# Patient Record
Sex: Male | Born: 1955
Health system: Southern US, Community
[De-identification: ages and names within clinical notes are randomized; demographics above are authoritative.]

## PROBLEM LIST (undated history)

## (undated) DIAGNOSIS — E785 Hyperlipidemia, unspecified: Secondary | ICD-10-CM

## (undated) DIAGNOSIS — G4700 Insomnia, unspecified: Secondary | ICD-10-CM

## (undated) DIAGNOSIS — I10 Essential (primary) hypertension: Secondary | ICD-10-CM

## (undated) DIAGNOSIS — Z8619 Personal history of other infectious and parasitic diseases: Secondary | ICD-10-CM

## (undated) DIAGNOSIS — M5431 Sciatica, right side: Secondary | ICD-10-CM

## (undated) DIAGNOSIS — K5790 Diverticulosis of intestine, part unspecified, without perforation or abscess without bleeding: Secondary | ICD-10-CM

## (undated) DIAGNOSIS — M6530 Trigger finger, unspecified finger: Secondary | ICD-10-CM

## (undated) DIAGNOSIS — D099 Carcinoma in situ, unspecified: Secondary | ICD-10-CM

## (undated) DIAGNOSIS — G609 Hereditary and idiopathic neuropathy, unspecified: Secondary | ICD-10-CM

## (undated) DIAGNOSIS — K635 Polyp of colon: Secondary | ICD-10-CM

## (undated) DIAGNOSIS — Z0000 Encounter for general adult medical examination without abnormal findings: Secondary | ICD-10-CM

## (undated) DIAGNOSIS — N200 Calculus of kidney: Secondary | ICD-10-CM

## (undated) DIAGNOSIS — J302 Other seasonal allergic rhinitis: Secondary | ICD-10-CM

## (undated) DIAGNOSIS — Z87442 Personal history of urinary calculi: Secondary | ICD-10-CM

## (undated) DIAGNOSIS — E1149 Type 2 diabetes mellitus with other diabetic neurological complication: Secondary | ICD-10-CM

## (undated) HISTORY — DX: Hereditary and idiopathic neuropathy, unspecified: G60.9

## (undated) HISTORY — DX: Polyp of colon: K63.5

## (undated) HISTORY — DX: Carcinoma in situ, unspecified: D09.9

## (undated) HISTORY — DX: Other seasonal allergic rhinitis: J30.2

## (undated) HISTORY — DX: Personal history of urinary calculi: Z87.442

## (undated) HISTORY — DX: Encounter for general adult medical examination without abnormal findings: Z00.00

## (undated) HISTORY — DX: Personal history of other infectious and parasitic diseases: Z86.19

## (undated) HISTORY — DX: Diverticulosis of intestine, part unspecified, without perforation or abscess without bleeding: K57.90

## (undated) HISTORY — DX: Trigger finger, unspecified finger: M65.30

## (undated) HISTORY — DX: Calculus of kidney: N20.0

## (undated) HISTORY — DX: Insomnia, unspecified: G47.00

## (undated) HISTORY — DX: Sciatica, right side: M54.31

## (undated) HISTORY — PX: HAND SURGERY: SHX662

## (undated) HISTORY — PX: ROTATOR CUFF REPAIR: SHX139

## (undated) HISTORY — PX: SHOULDER ARTHROSCOPY: SHX128

## (undated) HISTORY — DX: Hyperlipidemia, unspecified: E78.5

## (undated) HISTORY — PX: HERNIA REPAIR: SHX51

## (undated) HISTORY — PX: CHOLECYSTECTOMY: SHX55

## (undated) HISTORY — PX: ABDOMINAL HERNIA REPAIR: SHX539

## (undated) HISTORY — DX: Type 2 diabetes mellitus with other diabetic neurological complication: E11.49

---

## 2010-09-24 ENCOUNTER — Emergency Department (HOSPITAL_COMMUNITY)
Admission: EM | Admit: 2010-09-24 | Discharge: 2010-09-24 | Disposition: A | Discharge status: Home / Self Care | Payer: 59 | Attending: Emergency Medicine | Admitting: Emergency Medicine

## 2010-09-24 DIAGNOSIS — E7800 Pure hypercholesterolemia, unspecified: Secondary | ICD-10-CM | POA: Insufficient documentation

## 2010-09-24 DIAGNOSIS — M545 Low back pain, unspecified: Secondary | ICD-10-CM | POA: Insufficient documentation

## 2010-09-24 DIAGNOSIS — E119 Type 2 diabetes mellitus without complications: Secondary | ICD-10-CM | POA: Insufficient documentation

## 2010-09-24 DIAGNOSIS — M5430 Sciatica, unspecified side: Secondary | ICD-10-CM | POA: Insufficient documentation

## 2012-02-06 ENCOUNTER — Emergency Department (HOSPITAL_BASED_OUTPATIENT_CLINIC_OR_DEPARTMENT_OTHER): Payer: 59

## 2012-02-06 ENCOUNTER — Emergency Department (HOSPITAL_BASED_OUTPATIENT_CLINIC_OR_DEPARTMENT_OTHER)
Admission: EM | Admit: 2012-02-06 | Discharge: 2012-02-06 | Disposition: A | Discharge status: Home / Self Care | Payer: 59 | Attending: Emergency Medicine | Admitting: Emergency Medicine

## 2012-02-06 ENCOUNTER — Encounter (HOSPITAL_BASED_OUTPATIENT_CLINIC_OR_DEPARTMENT_OTHER): Payer: Self-pay | Admitting: *Deleted

## 2012-02-06 DIAGNOSIS — I10 Essential (primary) hypertension: Secondary | ICD-10-CM | POA: Insufficient documentation

## 2012-02-06 DIAGNOSIS — E119 Type 2 diabetes mellitus without complications: Secondary | ICD-10-CM | POA: Insufficient documentation

## 2012-02-06 DIAGNOSIS — M51379 Other intervertebral disc degeneration, lumbosacral region without mention of lumbar back pain or lower extremity pain: Secondary | ICD-10-CM | POA: Insufficient documentation

## 2012-02-06 DIAGNOSIS — M25569 Pain in unspecified knee: Secondary | ICD-10-CM | POA: Insufficient documentation

## 2012-02-06 DIAGNOSIS — M5136 Other intervertebral disc degeneration, lumbar region: Secondary | ICD-10-CM

## 2012-02-06 DIAGNOSIS — M5137 Other intervertebral disc degeneration, lumbosacral region: Secondary | ICD-10-CM | POA: Insufficient documentation

## 2012-02-06 HISTORY — DX: Essential (primary) hypertension: I10

## 2012-02-06 MED ORDER — OXYCODONE-ACETAMINOPHEN 5-325 MG PO TABS
2.0000 | ORAL_TABLET | Freq: Once | ORAL | Status: AC
  Administered 2012-02-06: 2 via ORAL
  Filled 2012-02-06: qty 2

## 2012-02-06 MED ORDER — IBUPROFEN 800 MG PO TABS: 800.0000 mg | ORAL_TABLET | Freq: Three times a day (TID) | ORAL | Status: AC

## 2012-02-06 MED ORDER — PREDNISONE 10 MG PO TABS: ORAL_TABLET | ORAL | Status: DC

## 2012-02-06 MED ORDER — OXYCODONE-ACETAMINOPHEN 5-325 MG PO TABS: 2.0000 | ORAL_TABLET | ORAL | Status: DC | PRN

## 2012-02-06 NOTE — ED Provider Notes (Signed)
Medical screening examination/treatment/procedure(s) were conducted as a shared visit with non-physician practitioner(s) and myself.  I personally evaluated the patient during the encounter 56 yo man with diet controlled diabetes has pain that radiates from right thigh into right foot.  He also has occasional numbness and tingling in both feet.  Exam shows no palpable deformity or tenderness in his right thigh, knee, calf, ankle or foot.  He has intact sensation in the right foot and has palpable DP and PT pulses in both feet.  Suspect his pain is radicular, and recommended LS spine MRI to check for disc disease.  Carleene Cooper III, MD 02/06/12 [phone]

## 2012-02-06 NOTE — ED Provider Notes (Signed)
History     CSN: [id number]  Arrival date & time 02/06/12  1128   First MD Initiated Contact with Patient 02/06/12 1219      Chief Complaint  Patient presents with  . Leg Pain    (Consider location/radiation/quality/duration/timing/severity/associated sxs/prior treatment) Patient is a 56 y.o. male presenting with leg pain. The history is provided by the patient. No language interpreter was used.  Leg Pain  Incident onset: 3 months. The incident occurred at home. There was no injury mechanism. The pain is present in the right thigh. The quality of the pain is described as aching. The pain is moderate. Associated symptoms include numbness. The symptoms are aggravated by bearing weight. He has tried nothing for the symptoms.  Pt reports he has had sciatica on the right side for several months.  Pt reports pain in right anterior leg from knee down to shin for several days.  Past Medical History  Diagnosis Date  . Diabetes mellitus   . Hypertension     Past Surgical History  Procedure Date  . Hernia repair   . Abdominal hernia repair   . Rotator cuff repair   . Cholecystectomy     No family history on file.  History  Substance Use Topics  . Smoking status: Never Smoker   . Smokeless tobacco: Not on file  . Alcohol Use: No      Review of Systems  Musculoskeletal: Positive for joint swelling.  Neurological: Positive for numbness.  All other systems reviewed and are negative.    Allergies  Iodine  Home Medications   Current Outpatient Rx  Name Route Sig Dispense Refill  . CETIRIZINE HCL 10 MG PO TABS Oral Take 10 mg by mouth daily.    . MULTIPLE VITAMINS PO Oral Take by mouth.      BP 133/88  Pulse 95  Temp 97.8 F (36.6 C) (Oral)  Ht 6' (1.829 m)  Wt 180 lb (81.647 kg)  BMI 24.41 kg/m2  SpO2 98%  Physical Exam  Vitals reviewed. Constitutional: He appears well-developed and well-nourished.  HENT:  Head: Normocephalic and atraumatic.  Eyes:  Conjunctivae and EOM are normal. Pupils are equal, round, and reactive to light.  Neck: Normal range of motion. Neck supple.  Cardiovascular: Normal rate, normal heart sounds and intact distal pulses.   Pulmonary/Chest: Effort normal.  Abdominal: Soft.  Musculoskeletal: He exhibits tenderness.       Tender right knee,    Neurological: He is alert.  Skin: Skin is warm.  Psychiatric: He has a normal mood and affect.    ED Course  Procedures (including critical care time)  Labs Reviewed - No data to display Dg Knee 1-2 Views Right  02/06/2012  *RADIOLOGY REPORT*  Clinical Data: Right knee pain.  RIGHT KNEE - 1-2 VIEW  Comparison: None  Findings: The joint spaces are maintained.  No acute fracture or osteochondral lesion.  No definite joint effusion.  IMPRESSION: No acute bony findings or significant degenerative changes.  Original Report Authenticated By: P. Loralie Champagne, M.D.   No results found for this or any previous visit. Dg Knee 1-2 Views Right  02/06/2012  *RADIOLOGY REPORT*  Clinical Data: Right knee pain.  RIGHT KNEE - 1-2 VIEW  Comparison: None  Findings: The joint spaces are maintained.  No acute fracture or osteochondral lesion.  No definite joint effusion.  IMPRESSION: No acute bony findings or significant degenerative changes.  Original Report Authenticated By: P. Loralie Champagne, M.D.   Mr Lumbar  Spine Wo Contrast  02/06/2012  *RADIOLOGY REPORT*  Clinical Data: Right leg pain.  Bilateral foot tingling.  MRI LUMBAR SPINE WITHOUT CONTRAST  Technique:  Multiplanar and multiecho pulse sequences of the lumbar spine were obtained without intravenous contrast.  Comparison: None.  Findings: The numbering convention used for this exam terms L5-S1 as the last full intervertebral disc space above the sacrum. Marrow signal is normal allowing for degenerative endplate changes at L1-L2.  Vacuum disc at L5-S1.  Spinal cord terminates posterior to the T12-L1 interspace.  T12-L1:  Shallow  broad-based disc bulging.  Sagittal imaging only. No stenosis.  L1-L2:  Disc desiccation with shallow broad-based posterior disc bulging.  No resulting stenosis.  L2-L3:  Negative.  L3-L4:  Shallow left foraminal and lateral disc protrusion contacts the exiting left L3 nerve.  Smaller right foraminal protrusion contacts the exiting right L3 nerve.  Central canal and lateral recesses patent.  Mild facet arthrosis.  L4-L5:  Disc desiccation.  Central canal appears patent.  Shallow broad-based disc bulging.  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 The foramina appear adequately patent.  Mild facet arthrosis.  L5-S1:  Disc desiccation.  No stenosis.  Foramina patent.  IMPRESSION: Mild multilevel lumbar spondylosis as described above.  No compressive stenosis.  Original Report Authenticated By: Andreas Newport, M.D.     1. Degeneration of lumbar intervertebral disc       MDM Dr. Ignacia Palma in to see and examine pt.   He advised MRi of pt Ls spine.    I reviewed results with pt.   I think leg pain is radicular.  Pt advised to follow up with Dr. Gerlene Fee for evaluation.   Pt given rx for percocet and prednisone.           Lonia Skinner [place], [place] 02/06/12 1727

## 2012-02-06 NOTE — Discharge Instructions (Signed)
Degenerative Disc Disease Degenerative disc disease is a condition caused by the changes that occur in the cushions of the backbone (spinal discs) as you grow older. Spinal discs are soft and compressible discs located between the bones of the spine (vertebrae). They act like shock absorbers. Degenerative disc disease can affect the wholespine. However, the neck and lower back are most commonly affected. Many changes can occur in the spinal discs with aging, such as:  The spinal discs may dry and shrink.   Small tears may occur in the tough, outer covering of the disc (annulus).   The disc space may become smaller due to loss of water.   Abnormal growths in the bone (spurs) may occur. This can put pressure on the nerve roots exiting the spinal canal, causing pain.   The spinal canal may become narrowed.  CAUSES  Degenerative disc disease is a condition caused by the changes that occur in the spinal discs with aging. The exact cause is not known, but there is a genetic basis for many patients. Degenerative changes can occur due to loss of fluid in the disc. This makes the disc thinner and reduces the space between the backbones. Small cracks can develop in the outer layer of the disc. This can lead to the breakdown of the disc. You are more likely to get degenerative disc disease if you are overweight. Smoking cigarettes and doing heavy work such as weightlifting can also increase your risk of this condition. Degenerative changes can start after a sudden injury. Growth of bone spurs can compress the nerve roots and cause pain.  SYMPTOMS  The symptoms vary from person to person. Some people may have no pain, while others have severe pain. The pain may be so severe that it can limit your activities. The location of the pain depends on the part of your backbone that is affected. You will have neck or arm pain if a disc in the neck area is affected. You will have pain in your back, buttocks, or legs if a  disc in the lower back is affected. The pain becomes worse while bending, reaching up, or with twisting movements. The pain may start gradually and then get worse as time passes. It may also start after a major or minor injury. You may feel numbness or tingling in the arms or legs.  DIAGNOSIS  Your caregiver will ask you about your symptoms and about activities or habits that may cause the pain. He or she may also ask about any injuries, diseases, ortreatments you have had earlier. Your caregiver will examine you to check for the range of movement that is possible in the affected area, to check for strength in your extremities, and to check for sensation in the areas of the arms and legs supplied by different nerve roots. An X-ray of the spine may be taken. Your caregiver may suggest other imaging tests, such as a computerized magnetic scan (MRI), if needed.  TREATMENT  Treatment includes rest, modifying your activities, and applying ice and heat. Your caregiver may prescribe medicines to reduce your pain and may ask you to do some exercises to strengthen your back. In some cases, you may need surgery. You and your caregiver will decide on the treatment that is best for you. HOME CARE INSTRUCTIONS   Follow proper lifting and walking techniques as advised by your caregiver.   Maintain good posture.   Exercise regularly as advised.   Perform relaxation exercises.   Change your sitting,   standing, and sleeping habits as advised. Change positions frequently.   Lose weight as advised.   Stop smoking if you smoke.   Wear supportive footwear.  SEEK MEDICAL CARE IF:  The pain does not go away within 1 to 4 weeks. SEEK IMMEDIATE MEDICAL CARE IF:   The pain is severe.   You notice weakness in your arms, hands, or legs.   You begin to lose control of your bladder or bowel.  MAKE SURE YOU:   Understand these instructions.   Will watch your condition.   Will get help right away if you are not  doing well or get worse.  Document Released: 05/28/2007 Document Revised: 07/20/2011 Document Reviewed: 05/28/2007 [employer] Patient Information 2012 [employer]. 

## 2012-02-06 NOTE — ED Notes (Signed)
Patient states he has had right leg pain for several months and was treated for sciatica.  States he has had intermittent pain and cramps in his leg , but this morning the pain has been persistent and had some swelling in the calf area.

## 2012-02-08 NOTE — ED Notes (Signed)
Pt called to ask if he could get a refill on pain medication until he can get in to see the neurologist looked up record to speak with MD

## 2012-02-09 ENCOUNTER — Encounter: Payer: Self-pay | Admitting: Internal Medicine

## 2012-02-09 ENCOUNTER — Ambulatory Visit (INDEPENDENT_AMBULATORY_CARE_PROVIDER_SITE_OTHER): Payer: 59 | Admitting: Internal Medicine

## 2012-02-09 VITALS — BP 122/82 | HR 108 | Temp 98.2°F | Resp 18 | Ht 70.5 in | Wt 194.0 lb

## 2012-02-09 DIAGNOSIS — E785 Hyperlipidemia, unspecified: Secondary | ICD-10-CM

## 2012-02-09 DIAGNOSIS — E1149 Type 2 diabetes mellitus with other diabetic neurological complication: Secondary | ICD-10-CM

## 2012-02-09 DIAGNOSIS — E119 Type 2 diabetes mellitus without complications: Secondary | ICD-10-CM

## 2012-02-09 DIAGNOSIS — IMO0002 Reserved for concepts with insufficient information to code with codable children: Secondary | ICD-10-CM

## 2012-02-09 DIAGNOSIS — M5416 Radiculopathy, lumbar region: Secondary | ICD-10-CM | POA: Insufficient documentation

## 2012-02-09 HISTORY — DX: Type 2 diabetes mellitus with other diabetic neurological complication: E11.49

## 2012-02-09 MED ORDER — OXYCODONE-ACETAMINOPHEN 7.5-325 MG PO TABS: 1.0000 | ORAL_TABLET | ORAL | Status: AC | PRN

## 2012-02-09 NOTE — Progress Notes (Signed)
  Subjective:    [name], male    DOB: 08/27/1955, 56 y.o.   MRN: [id number]  HPI Pt presents to clinic for evaluation of back pain. Notes right low back pain with intermittent radiation to right leg. Has associated numbness but denies weakness or incontinence. No injury/trauma. Seen in ED 6/25 and ls MRI performed. Reviewed with pt demonstrating bilateral disc protrusions at L3-4 contacting both exiting nerve roots. Currently taking prednisone taper and has run out of percocet (had #6). Was apparently referred to neurosurgery and has appointment next week. No alleviating or exacerbating factors. Reports DM now under good control after losing weight and focusing on diet/exercise. Baseline fsbs range 110-120 with recent peak of 195 while on prednisone.   Past Medical History  Diagnosis Date  . Diabetes mellitus   . Hypertension    Past Surgical History  Procedure Date  . Hernia repair   . Abdominal hernia repair   . Rotator cuff repair   . Cholecystectomy     reports that he has never smoked. He does not have any smokeless tobacco history on file. He reports that he does not drink alcohol or use illicit drugs. family history is not on file. Allergies  Allergen Reactions  . Iodine Swelling      Review of Systems  Constitutional: Negative for activity change.  Musculoskeletal: Positive for back pain.  Neurological: Positive for numbness. Negative for weakness.  All other systems reviewed and are negative.       Objective:   Physical Exam  Nursing note and vitals reviewed. Constitutional: He appears well-developed and well-nourished. No distress.  HENT:  Head: Normocephalic and atraumatic.  Right Ear: External ear normal.  Left Ear: External ear normal.  Eyes: Conjunctivae are normal. No scleral icterus.  Neck: Neck supple.  Musculoskeletal:       Gait nl. Bilateral LE strength 5/5. FROM bilateral legs.   Neurological: He is alert.  Skin: Skin is warm and  dry. He is not diaphoretic.  Psychiatric: He has a normal mood and affect.          Assessment & Plan:

## 2012-02-09 NOTE — Assessment & Plan Note (Signed)
Good control. Monitor fsbs carefully during steroid use. Schedule one month f/u with diabetic labs.

## 2012-02-09 NOTE — Assessment & Plan Note (Signed)
Continue steroid taper. rf percocet for short term prn use. Has neurosurgery evaluation soon. Discussed PT- given back exercises until can see specialist.

## 2012-02-09 NOTE — Patient Instructions (Signed)
Please schedule fasting labs prior to your next visit Cbc, chem7, a1c,urine microalbumin-250.00 and lipid/lft-272.7

## 2012-03-15 ENCOUNTER — Telehealth: Payer: Self-pay | Admitting: *Deleted

## 2012-03-15 DIAGNOSIS — E785 Hyperlipidemia, unspecified: Secondary | ICD-10-CM

## 2012-03-15 DIAGNOSIS — E119 Type 2 diabetes mellitus without complications: Secondary | ICD-10-CM

## 2012-03-15 LAB — HEPATIC FUNCTION PANEL
ALT: 19 U/L (ref 0–53)
AST: 16 U/L (ref 0–37)
Bilirubin, Direct: 0.1 mg/dL (ref 0.0–0.3)
Indirect Bilirubin: 0.6 mg/dL (ref 0.0–0.9)
Total Protein: 7.3 g/dL (ref 6.0–8.3)

## 2012-03-15 LAB — CBC WITH DIFFERENTIAL/PLATELET
Basophils Absolute: 0.1 10*3/uL (ref 0.0–0.1)
Eosinophils Absolute: 0.4 10*3/uL (ref 0.0–0.7)
Lymphocytes Relative: 45 % (ref 12–46)
Lymphs Abs: 2.9 10*3/uL (ref 0.7–4.0)
Neutrophils Relative %: 38 % — ABNORMAL LOW (ref 43–77)
Platelets: 230 10*3/uL (ref 150–400)
RBC: 5.61 MIL/uL (ref 4.22–5.81)
RDW: 14.1 % (ref 11.5–15.5)
WBC: 6.4 10*3/uL (ref 4.0–10.5)

## 2012-03-15 LAB — BASIC METABOLIC PANEL
BUN: 15 mg/dL (ref 6–23)
Calcium: 9.6 mg/dL (ref 8.4–10.5)
Creat: 0.7 mg/dL (ref 0.50–1.35)
Glucose, Bld: 249 mg/dL — ABNORMAL HIGH (ref 70–99)

## 2012-03-15 LAB — LIPID PANEL
LDL Cholesterol: 152 mg/dL — ABNORMAL HIGH (ref 0–99)
Triglycerides: 248 mg/dL — ABNORMAL HIGH (ref <150)
VLDL: 50 mg/dL — ABNORMAL HIGH (ref 0–40)

## 2012-03-15 LAB — HEMOGLOBIN A1C: Hgb A1c MFr Bld: 13.5 % — ABNORMAL HIGH (ref <5.7)

## 2012-03-15 NOTE — Telephone Encounter (Signed)
Pt presented to the lab for blood work. Orders entered per previous office note:  Please schedule fasting labs prior to your next visit  Cbc, chem7, a1c,urine microalbumin-250.00 and lipid/lft-272.7

## 2012-03-16 LAB — MICROALBUMIN / CREATININE URINE RATIO
Creatinine, Urine: 224.8 mg/dL
Microalb, Ur: 2.59 mg/dL — ABNORMAL HIGH (ref 0.00–1.89)

## 2012-03-21 ENCOUNTER — Ambulatory Visit (INDEPENDENT_AMBULATORY_CARE_PROVIDER_SITE_OTHER): Payer: 59 | Admitting: Internal Medicine

## 2012-03-21 ENCOUNTER — Encounter: Payer: Self-pay | Admitting: Internal Medicine

## 2012-03-21 VITALS — BP 116/84 | HR 108 | Temp 97.8°F | Resp 16 | Wt 195.0 lb

## 2012-03-21 DIAGNOSIS — E119 Type 2 diabetes mellitus without complications: Secondary | ICD-10-CM

## 2012-03-21 DIAGNOSIS — E785 Hyperlipidemia, unspecified: Secondary | ICD-10-CM

## 2012-03-21 MED ORDER — ATORVASTATIN CALCIUM 20 MG PO TABS: 20.0000 mg | ORAL_TABLET | Freq: Every day | ORAL | Status: DC

## 2012-03-21 MED ORDER — METFORMIN HCL 500 MG PO TABS: 500.0000 mg | ORAL_TABLET | Freq: Two times a day (BID) | ORAL | Status: DC

## 2012-03-21 NOTE — Progress Notes (Signed)
  Subjective:    [name], male    DOB: 12-24-1955, 56 y.o.   MRN: [id number]  HPI Pt presents to clinic for followup of multiple medical problems. Notes home fsbs 200's. Reviewed a1c elevated 13.5. Admits to some dietary indiscretion with sugar. Has been off medication for ~ 1 year. LDL reviewed above target. Back pain improved. No other complaints.  Past Medical History  Diagnosis Date  . Diabetes mellitus   . Hypertension   . History of chicken pox     childhood  . Seasonal allergies   . History of kidney stones   . Hyperlipidemia    Past Surgical History  Procedure Date  . Hernia repair   . Abdominal hernia repair   . Rotator cuff repair   . Cholecystectomy     reports that he has never smoked. He has never used smokeless tobacco. He reports that he does not drink alcohol or use illicit drugs. family history includes Cancer in his paternal grandmother; Diabetes in his maternal aunt; and Lung cancer in his mother.  There is no history of Breast cancer, and Colon cancer, and Prostate cancer, and Heart disease, . Allergies  Allergen Reactions  . Contrast Media (Iodinated Diagnostic Agents)   . Iodine Swelling      Review of Systems see hpi     Objective:   Physical Exam  Nursing note and vitals reviewed. Constitutional: He appears well-developed and well-nourished. No distress.  HENT:  Head: Normocephalic and atraumatic.  Neurological: He is alert.  Skin: He is not diaphoretic.  Psychiatric: He has a normal mood and affect.          Assessment & Plan:

## 2012-03-21 NOTE — Assessment & Plan Note (Signed)
Poor control. Asx. Begin metformin. Pursue diabetic diet and exercise. Close f/u in 2-3 wks to review progress. Check fsbs bid due to hyperglycemia.

## 2012-03-21 NOTE — Assessment & Plan Note (Signed)
Begin lipitor 20mg  qd

## 2012-03-26 ENCOUNTER — Encounter: Payer: 59 | Admitting: Internal Medicine

## 2012-04-02 ENCOUNTER — Encounter (HOSPITAL_BASED_OUTPATIENT_CLINIC_OR_DEPARTMENT_OTHER): Payer: Self-pay | Admitting: *Deleted

## 2012-04-02 ENCOUNTER — Ambulatory Visit (INDEPENDENT_AMBULATORY_CARE_PROVIDER_SITE_OTHER): Payer: 59 | Admitting: Family

## 2012-04-02 ENCOUNTER — Emergency Department (HOSPITAL_BASED_OUTPATIENT_CLINIC_OR_DEPARTMENT_OTHER)
Admission: EM | Admit: 2012-04-02 | Discharge: 2012-04-02 | Disposition: A | Discharge status: Home / Self Care | Payer: 59 | Attending: Emergency Medicine | Admitting: Emergency Medicine

## 2012-04-02 ENCOUNTER — Emergency Department (HOSPITAL_BASED_OUTPATIENT_CLINIC_OR_DEPARTMENT_OTHER): Payer: 59

## 2012-04-02 ENCOUNTER — Encounter: Payer: Self-pay | Admitting: Family

## 2012-04-02 VITALS — BP 118/86 | HR 87 | Temp 97.8°F | Resp 16 | Wt 193.8 lb

## 2012-04-02 DIAGNOSIS — R109 Unspecified abdominal pain: Secondary | ICD-10-CM

## 2012-04-02 DIAGNOSIS — E119 Type 2 diabetes mellitus without complications: Secondary | ICD-10-CM | POA: Insufficient documentation

## 2012-04-02 DIAGNOSIS — R110 Nausea: Secondary | ICD-10-CM | POA: Insufficient documentation

## 2012-04-02 DIAGNOSIS — K469 Unspecified abdominal hernia without obstruction or gangrene: Secondary | ICD-10-CM

## 2012-04-02 DIAGNOSIS — R10814 Left lower quadrant abdominal tenderness: Secondary | ICD-10-CM | POA: Insufficient documentation

## 2012-04-02 DIAGNOSIS — I10 Essential (primary) hypertension: Secondary | ICD-10-CM | POA: Insufficient documentation

## 2012-04-02 DIAGNOSIS — R197 Diarrhea, unspecified: Secondary | ICD-10-CM | POA: Insufficient documentation

## 2012-04-02 LAB — COMPREHENSIVE METABOLIC PANEL
Albumin: 4.2 g/dL (ref 3.5–5.2)
Alkaline Phosphatase: 85 U/L (ref 39–117)
BUN: 11 mg/dL (ref 6–23)
CO2: 24 mEq/L (ref 19–32)
Chloride: 97 mEq/L (ref 96–112)
Creatinine, Ser: 0.6 mg/dL (ref 0.50–1.35)
GFR calc Af Amer: >90 mL/min (ref >90)
GFR calc non Af Amer: >90 mL/min (ref >90)
Glucose, Bld: 170 mg/dL — ABNORMAL HIGH (ref 70–99)
Potassium: 3.6 mEq/L (ref 3.5–5.1)
Total Bilirubin: 0.7 mg/dL (ref 0.3–1.2)

## 2012-04-02 LAB — URINALYSIS, ROUTINE W REFLEX MICROSCOPIC
Glucose, UA: 500 mg/dL — AB
Ketones, ur: 15 mg/dL — AB
Leukocytes, UA: NEGATIVE
Protein, ur: NEGATIVE mg/dL
Urobilinogen, UA: 1 mg/dL (ref 0.0–1.0)

## 2012-04-02 LAB — CBC WITH DIFFERENTIAL/PLATELET
HCT: 42.9 % (ref 39.0–52.0)
Hemoglobin: 15.6 g/dL (ref 13.0–17.0)
Lymphocytes Relative: 42 % (ref 12–46)
Lymphs Abs: 2.8 10*3/uL (ref 0.7–4.0)
MCHC: 36.4 g/dL — ABNORMAL HIGH (ref 30.0–36.0)
Monocytes Absolute: 0.8 10*3/uL (ref 0.1–1.0)
Monocytes Relative: 12 % (ref 3–12)
Neutro Abs: 2.6 10*3/uL (ref 1.7–7.7)
Neutrophils Relative %: 40 % — ABNORMAL LOW (ref 43–77)
RBC: 5.17 MIL/uL (ref 4.22–5.81)

## 2012-04-02 MED ORDER — IOHEXOL 300 MG/ML  SOLN: 18.0000 mL | INTRAMUSCULAR | Status: AC

## 2012-04-02 MED ORDER — DIPHENHYDRAMINE HCL 50 MG/ML IJ SOLN
25.0000 mg | Freq: Once | INTRAMUSCULAR | Status: AC
  Administered 2012-04-02: 25 mg via INTRAVENOUS
  Filled 2012-04-02 (×2): qty 1

## 2012-04-02 MED ORDER — IOHEXOL 300 MG/ML  SOLN
100.0000 mL | Freq: Once | INTRAMUSCULAR | Status: AC | PRN
  Administered 2012-04-02: 100 mL via INTRAVENOUS

## 2012-04-02 MED ORDER — ONDANSETRON HCL 4 MG/2ML IJ SOLN
4.0000 mg | Freq: Once | INTRAMUSCULAR | Status: AC
  Administered 2012-04-02: 4 mg via INTRAVENOUS
  Filled 2012-04-02 (×2): qty 2

## 2012-04-02 MED ORDER — MORPHINE SULFATE 4 MG/ML IJ SOLN
4.0000 mg | Freq: Once | INTRAMUSCULAR | Status: AC
  Administered 2012-04-02: 4 mg via INTRAVENOUS
  Filled 2012-04-02 (×2): qty 1

## 2012-04-02 MED ORDER — DIPHENHYDRAMINE HCL 50 MG/ML IJ SOLN
INTRAMUSCULAR | Status: AC
  Administered 2012-04-02: 25 mg via INTRAVENOUS
  Filled 2012-04-02: qty 1

## 2012-04-02 MED ORDER — DIPHENHYDRAMINE HCL 50 MG/ML IJ SOLN
25.0000 mg | Freq: Once | INTRAMUSCULAR | Status: AC
  Administered 2012-04-02: 25 mg via INTRAVENOUS

## 2012-04-02 NOTE — ED Provider Notes (Addendum)
History     CSN: [id number]  Arrival date & time 04/02/12  1540   First MD Initiated Contact with Patient 04/02/12 1548      Chief Complaint  Patient presents with  . Abdominal Pain    (Consider location/radiation/quality/duration/timing/severity/associated sxs/prior treatment) Patient is a 56 y.o. male presenting with abdominal pain. The history is provided by the patient.  Abdominal Pain The primary symptoms of the illness include abdominal pain, nausea and diarrhea. The primary symptoms of the illness do not include fever, vomiting or dysuria. The current episode started 13 to 24 hours ago. The onset of the illness was gradual. The problem has been gradually worsening.  The pain came on gradually. The abdominal pain has been gradually worsening since its onset. The abdominal pain is located in the LLQ. The abdominal pain does not radiate. The severity of the abdominal pain is 7/10. The abdominal pain is relieved by nothing. The abdominal pain is exacerbated by certain positions (Palpation).  The diarrhea began yesterday. The diarrhea is watery. The diarrhea occurs 2 to 4 times per day.  Symptoms associated with the illness do not include anorexia, constipation, urgency, frequency or back pain. Significant associated medical issues do not include diverticulitis.    Past Medical History  Diagnosis Date  . Diabetes mellitus   . Hypertension   . History of chicken pox     childhood  . Seasonal allergies   . History of kidney stones   . Hyperlipidemia     Past Surgical History  Procedure Date  . Hernia repair   . Abdominal hernia repair   . Rotator cuff repair   . Cholecystectomy     Family History  Problem Relation Age of Onset  . Lung cancer Mother   . Breast cancer Neg Hx   . Colon cancer Neg Hx   . Prostate cancer Neg Hx   . Heart disease Neg Hx   . Diabetes Maternal Aunt   . Cancer Paternal Grandmother     History  Substance Use Topics  . Smoking status:  Never Smoker   . Smokeless tobacco: Never Used  . Alcohol Use: No      Review of Systems  Constitutional: Negative for fever.  Gastrointestinal: Positive for nausea, abdominal pain and diarrhea. Negative for vomiting, constipation and anorexia.  Genitourinary: Negative for dysuria, urgency and frequency.  Musculoskeletal: Negative for back pain.  All other systems reviewed and are negative.    Allergies  Contrast media and Iodine  Home Medications   Current Outpatient Rx  Name Route Sig Dispense Refill  . ATORVASTATIN CALCIUM 20 MG PO TABS Oral Take 1 tablet (20 mg total) by mouth daily. 30 tablet 6  . CETIRIZINE HCL 10 MG PO TABS Oral Take 10 mg by mouth daily.    . CYCLOBENZAPRINE HCL 10 MG PO TABS Oral Take 10 mg by mouth daily.    . IBUPROFEN 200 MG PO TABS Oral Take 200 mg by mouth as needed. Patient used this medication for his pain.    Marland Kitchen BLUE-EMU SUPER STRENGTH EX Apply externally Apply 1 application topically every morning. Patient used this medication on his leg for pain.    Marland Kitchen METFORMIN HCL 500 MG PO TABS Oral Take 1 tablet (500 mg total) by mouth 2 (two) times daily with a meal. 60 tablet 6  . MULTIPLE VITAMINS PO Oral Take by mouth.    . NABUMETONE 500 MG PO TABS Oral Take 1,000 mg by mouth 2 (two) times  daily.      BP 135/91  Pulse 89  Temp 98.7 F (37.1 C) (Oral)  Resp 16  Ht 6' (1.829 m)  Wt 193 lb (87.544 kg)  BMI 26.18 kg/m2  SpO2 100%  Physical Exam  Nursing note and vitals reviewed. Constitutional: He is oriented to person, place, and time. He appears well-developed and well-nourished. No distress.  HENT:  Head: Normocephalic and atraumatic.  Mouth/Throat: Oropharynx is clear and moist.  Eyes: Conjunctivae and EOM are normal. Pupils are equal, round, and reactive to light.  Neck: Normal range of motion. Neck supple.  Cardiovascular: Normal rate, regular rhythm and intact distal pulses.   No murmur heard. Pulmonary/Chest: Effort normal and breath  sounds normal. No respiratory distress. He has no wheezes. He has no rales.  Abdominal: Soft. Normal appearance. He exhibits no distension. There is tenderness in the left lower quadrant. There is no rebound, no guarding and no CVA tenderness. No hernia. Hernia confirmed negative in the left inguinal area.  Musculoskeletal: Normal range of motion. He exhibits no edema and no tenderness.  Neurological: He is alert and oriented to person, place, and time.  Skin: Skin is warm and dry. No rash noted. No erythema.  Psychiatric: He has a normal mood and affect. His behavior is normal.    ED Course  Procedures (including critical care time)  Labs Reviewed  CBC WITH DIFFERENTIAL - Abnormal; Notable for the following:    MCHC 36.4 (*)     Neutrophils Relative 40 (*)     All other components within normal limits  COMPREHENSIVE METABOLIC PANEL - Abnormal; Notable for the following:    Sodium 134 (*)     Glucose, Bld 170 (*)     All other components within normal limits  URINALYSIS, ROUTINE W REFLEX MICROSCOPIC - Abnormal; Notable for the following:    Color, Urine AMBER (*)  BIOCHEMICALS MAY BE AFFECTED BY COLOR   Specific Gravity, Urine 1.038 (*)     Glucose, UA 500 (*)     Ketones, ur 15 (*)     All other components within normal limits   Ct Abdomen Pelvis W Contrast  04/02/2012  *RADIOLOGY REPORT*  Clinical Data: Left-sided abdominal pain.  Rule out hernia or diverticulitis.  CT ABDOMEN AND PELVIS WITH CONTRAST  Technique:  Multidetector CT imaging of the abdomen and pelvis was performed following the standard protocol during bolus administration of intravenous contrast.  Contrast: OMNIPAQUE IOHEXOL 300 MG/ML  SOLN  Comparison: None.  Findings: Lung bases are clear.  Liver and bile ducts are normal. Gallbladder not visualized and may be surgically absent.  Pancreas is fatty replaced without evidence of mass or pancreatitis. Kidneys and spleen are normal.  Negative for bowel obstruction or  bowel thickening.  Negative for diverticulitis.  Negative for hernia. Prostate is moderately enlarged.  IMPRESSION: No acute abnormality.   Original Report Authenticated By: Camelia Phenes, M.D.      1. Abdominal  pain, other specified site       MDM   Sided abdominal pain who was seen by his PMD today who felt like he may have an incarcerated hernia. On exam here difficult to palpate any incarcerated hernias. Patient is not vomiting but has had diarrhea without fever. Prior history of ventral hernia repair and inguinal hernia repair on the right left-sided repairs. On exam patient does not have evidence of an inguinal hernia and has positive bowel sounds. Concern for possible diverticulitis or spigalian hernia.  We will get a CT of the abdomen to further evaluate. Patient states he is allergic to contrast dye however he receives Benadryl 30 minutes prior to the study he has no issues.  Patient will be premedicated with Benadryl. He was given pain and nausea control. CBC, CMP, UA pending.  7:17 PM Labs are unrevealing and CT was negative for acute pathology. This was discussed with the patient and he will followup with his PMD or return for worsening symptoms.       Gwyneth Sprout, MD 04/02/12 [id number]  Gwyneth Sprout, MD 04/02/12 [id number]

## 2012-04-02 NOTE — ED Notes (Signed)
Pt c/o lower left abd pain with n/v/d sent here from PMD office

## 2012-04-02 NOTE — Assessment & Plan Note (Signed)
I am concerned re: possibility of incarcerated abdominal hernia. I have advised pt to go downstairs to the ED for further evaluation.

## 2012-04-02 NOTE — Patient Instructions (Addendum)
Please proceed to the ED on the first floor for further evaluation.  

## 2012-04-02 NOTE — Progress Notes (Signed)
  Subjective:    [name], male    DOB: 1956-03-09, 56 y.o.   MRN: [id number]  HPI  [name] is a 56 yr old male with chief complaint of abdominal pain and abdominal bulging.  First noticed it yesterday.  Woke up today and pain was worse.  Reports last week he had nausea and vomitting.  Today he had some cheerios followed by diarrhea.  Pain is worsened by nothing, pain is improved by nothing. Pain is 6/10.    Review of Systems See HPI    Objective:   Physical Exam  Constitutional: He appears well-developed and well-nourished. No distress.  Abdominal:       Hypoactive BS.  + left upper abdominal bulging is noted. Bulge is tender and cannot be reduced.           Assessment & Plan:  Spoke to Albert [name] Nurse in ED and advised her that we are sending pt for evaluation of hernia.

## 2012-04-04 ENCOUNTER — Encounter: Payer: Self-pay | Admitting: *Deleted

## 2012-04-04 ENCOUNTER — Ambulatory Visit (INDEPENDENT_AMBULATORY_CARE_PROVIDER_SITE_OTHER): Payer: 59 | Admitting: Internal Medicine

## 2012-04-04 ENCOUNTER — Encounter: Payer: Self-pay | Admitting: Internal Medicine

## 2012-04-04 VITALS — BP 126/88 | HR 83 | Temp 97.9°F | Resp 16 | Wt 195.0 lb

## 2012-04-04 DIAGNOSIS — E119 Type 2 diabetes mellitus without complications: Secondary | ICD-10-CM

## 2012-04-04 DIAGNOSIS — Z79899 Other long term (current) drug therapy: Secondary | ICD-10-CM

## 2012-04-04 DIAGNOSIS — K469 Unspecified abdominal hernia without obstruction or gangrene: Secondary | ICD-10-CM

## 2012-04-04 LAB — BASIC METABOLIC PANEL
CO2: 26 mEq/L (ref 19–32)
Calcium: 9.8 mg/dL (ref 8.4–10.5)
Creat: 0.72 mg/dL (ref 0.50–1.35)

## 2012-04-04 MED ORDER — METRONIDAZOLE 500 MG PO TABS: 500.0000 mg | ORAL_TABLET | Freq: Three times a day (TID) | ORAL | Status: AC

## 2012-04-04 MED ORDER — HYDROCODONE-ACETAMINOPHEN 5-500 MG PO TABS: 1.0000 | ORAL_TABLET | Freq: Three times a day (TID) | ORAL | Status: AC | PRN

## 2012-04-04 NOTE — Progress Notes (Signed)
  Subjective:    [name], male    DOB: 03-Jul-1956, 56 y.o.   MRN: [id number]  HPI Pt presents to clinic for followup of multiple medical problems. Recently noted left lateral abd wall bulge and tenderness. Underwent CT without noted etiology. Continues to suffer pain/tenderness. Diabetic control improving with fsbs 180's to low 200's. No hypoglycemia. Currently off metformin by radiology protocol after contrast admin.  Past Medical History  Diagnosis Date  . Diabetes mellitus   . Hypertension   . History of chicken pox     childhood  . Seasonal allergies   . History of kidney stones   . Hyperlipidemia    Past Surgical History  Procedure Date  . Hernia repair   . Abdominal hernia repair   . Rotator cuff repair   . Cholecystectomy     reports that he has never smoked. He has never used smokeless tobacco. He reports that he does not drink alcohol or use illicit drugs. family history includes Cancer in his paternal grandmother; Diabetes in his maternal aunt; and Lung cancer in his mother.  There is no history of Breast cancer, and Colon cancer, and Prostate cancer, and Heart disease, . Allergies  Allergen Reactions  . Contrast Media (Iodinated Diagnostic Agents)   . Iodine Swelling      Review of Systems see hpi     Objective:   Physical Exam  Nursing note and vitals reviewed. Constitutional: He appears well-developed and well-nourished. No distress.  HENT:  Head: Normocephalic and atraumatic.  Right Ear: External ear normal.  Left Ear: External ear normal.  Eyes: Conjunctivae are normal. No scleral icterus.  Neck: Neck supple.  Abdominal: Soft. Bowel sounds are normal. He exhibits no distension. There is tenderness. There is no rebound and no guarding.       Left lateral abd wall- standing- soft tissue prominence oblong shaped and tender.   Neurological: He is alert.  Skin: Skin is warm and dry. He is not diaphoretic.  Psychiatric: He has a normal mood  and affect.          Assessment & Plan:

## 2012-04-07 NOTE — Assessment & Plan Note (Signed)
Hernia vs msk etiology. Not visualized on ct. Proceed with surgery consult.

## 2012-04-07 NOTE — Assessment & Plan Note (Signed)
Was improving prior to metformin hold. Obtain cr to resume metformin. Diet/exercise. F/u 2-3 weeks

## 2012-04-19 ENCOUNTER — Telehealth (INDEPENDENT_AMBULATORY_CARE_PROVIDER_SITE_OTHER): Payer: Self-pay

## 2012-04-19 NOTE — Telephone Encounter (Signed)
LMOM office cxed. Pt needs to r/s appt.

## 2012-04-22 ENCOUNTER — Telehealth: Payer: Self-pay | Admitting: *Deleted

## 2012-04-22 NOTE — Telephone Encounter (Signed)
Can Albert [name] try and get a sooner appt?

## 2012-04-22 NOTE — Telephone Encounter (Signed)
Pt reports that [facility]/Dr. Cornett's office canceled 09.10.13 OV & cannot see pt until 09.26.13 [2 wks.]; c/o pertrusion on Left side has gotten bigger & pain is worse; "can't wait that long to find out something as to what's going on". Pt has 3-wk f/u OV scheduled for tomorrow 09.11.13/SLS

## 2012-04-22 NOTE — Telephone Encounter (Signed)
Information forwarded to [facility]; [facility] with contact name & number to inform pt that we are working on getting his appt moved forwarded w/[facility] and will call back as soon as we have information/SLS

## 2012-04-23 ENCOUNTER — Ambulatory Visit (INDEPENDENT_AMBULATORY_CARE_PROVIDER_SITE_OTHER): Payer: Self-pay | Admitting: Surgery

## 2012-04-23 NOTE — Telephone Encounter (Signed)
LMOM with contact name & number to inform patient that his appointment at [facility]/Dr. Cornett's office has been r/s for Thursday, April 25, 2012, at 3:45pm [facility] for return call to verify message recvd]/SLS

## 2012-04-23 NOTE — Telephone Encounter (Signed)
Pt called to confirm that he recvd message/SLS

## 2012-04-24 ENCOUNTER — Encounter: Payer: Self-pay | Admitting: Internal Medicine

## 2012-04-24 ENCOUNTER — Ambulatory Visit (INDEPENDENT_AMBULATORY_CARE_PROVIDER_SITE_OTHER): Payer: 59 | Admitting: Internal Medicine

## 2012-04-24 VITALS — BP 122/82 | HR 90 | Temp 97.9°F | Resp 16 | Wt 198.0 lb

## 2012-04-24 DIAGNOSIS — E119 Type 2 diabetes mellitus without complications: Secondary | ICD-10-CM

## 2012-04-24 MED ORDER — METFORMIN HCL 1000 MG PO TABS: 1000.0000 mg | ORAL_TABLET | Freq: Two times a day (BID) | ORAL | Status: DC

## 2012-04-25 ENCOUNTER — Encounter (INDEPENDENT_AMBULATORY_CARE_PROVIDER_SITE_OTHER): Payer: Self-pay | Admitting: Surgery

## 2012-04-25 ENCOUNTER — Ambulatory Visit (INDEPENDENT_AMBULATORY_CARE_PROVIDER_SITE_OTHER): Payer: Commercial Managed Care - PPO | Admitting: Surgery

## 2012-04-25 ENCOUNTER — Telehealth: Payer: Self-pay | Admitting: Internal Medicine

## 2012-04-25 VITALS — BP 122/86 | HR 80 | Resp 14 | Ht 72.0 in | Wt 192.3 lb

## 2012-04-25 DIAGNOSIS — D1739 Benign lipomatous neoplasm of skin and subcutaneous tissue of other sites: Secondary | ICD-10-CM

## 2012-04-25 DIAGNOSIS — D171 Benign lipomatous neoplasm of skin and subcutaneous tissue of trunk: Secondary | ICD-10-CM

## 2012-04-25 NOTE — Telephone Encounter (Signed)
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  He will remove it for the patient.

## 2012-04-25 NOTE — Progress Notes (Signed)
[name], male   DOB: 18-Apr-1956, 56 y.o.   MRN: [id number]  No chief complaint on file.   [name] is a 56 y.o. male.  Patient sent at the request of Dr.Hodgin Karene Fry, MD do to abdominal wall mass located left upper quadrant. It has been present for multiple months and is getting larger. It is tender to palpate and he has shooting pains from time to time sharp in nature lasting minutes to hours through the mass. Activity does not seem to worsen the pain. HPI  Past Medical History  Diagnosis Date  . Diabetes mellitus   . Hypertension   . History of chicken pox     childhood  . Seasonal allergies   . History of kidney stones   . Hyperlipidemia     Past Surgical History  Procedure Date  . Hernia repair   . Abdominal hernia repair   . Rotator cuff repair   . Cholecystectomy     Family History  Problem Relation Age of Onset  . Lung cancer Mother   . Breast cancer Neg Hx   . Colon cancer Neg Hx   . Prostate cancer Neg Hx   . Heart disease Neg Hx   . Diabetes Maternal Aunt   . Cancer Paternal Grandmother     Social History History  Substance Use Topics  . Smoking status: Never Smoker   . Smokeless tobacco: Never Used  . Alcohol Use: No    Allergies  Allergen Reactions  . Contrast Media (Iodinated Diagnostic Agents)   . Iodine Swelling    Current Outpatient Prescriptions  Medication Sig Dispense Refill  . atorvastatin (LIPITOR) 20 MG tablet Take 1 tablet (20 mg total) by mouth daily.  30 tablet  6  . cetirizine (ZYRTEC) 10 MG tablet Take 10 mg by mouth daily.      . cyclobenzaprine (FLEXERIL) 10 MG tablet Take 10 mg by mouth daily.      Marland Kitchen ibuprofen (ADVIL,MOTRIN) 200 MG tablet Take 400 mg by mouth as needed. Patient used this medication for his pain.      . Liniments (BLUE-EMU SUPER STRENGTH EX) Apply 1 application topically every morning. Patient used this medication on his leg for pain.      . metFORMIN (GLUCOPHAGE) 1000 MG  tablet Take 1 tablet (1,000 mg total) by mouth 2 (two) times daily with a meal.  180 tablet  3  . MULTIPLE VITAMINS PO Take by mouth.      . nabumetone (RELAFEN) 500 MG tablet Take 1,000 mg by mouth 2 (two) times daily.        Review of Systems Review of Systems  Constitutional: Negative for fever, chills and unexpected weight change.  HENT: Negative for hearing loss, congestion, sore throat, trouble swallowing and voice change.   Eyes: Negative for visual disturbance.  Respiratory: Negative for cough and wheezing.   Cardiovascular: Negative for chest pain, palpitations and leg swelling.  Gastrointestinal: Negative for nausea, vomiting, abdominal pain, diarrhea, constipation, blood in stool, abdominal distention, anal bleeding and rectal pain.  Genitourinary: Negative for hematuria and difficulty urinating.  Musculoskeletal: Negative for arthralgias.  Skin: Negative for rash and wound.  Neurological: Negative for seizures, syncope, weakness and headaches.  Hematological: Negative for adenopathy. Does not bruise/bleed easily.  Psychiatric/Behavioral: Negative for confusion.    Blood pressure 122/86, pulse 80, resp. rate 14, height 6' (1.829 m), weight 192 lb 5.1 oz (87.236 kg).  Physical Exam Physical Exam  Constitutional: He appears well-developed and well-nourished.  HENT:  Head: Normocephalic and atraumatic.  Eyes: EOM are normal. Pupils are equal, round, and reactive to light.  Neck: Normal range of motion. Neck supple.  Cardiovascular: Normal rate, regular rhythm and normal pulses.  Exam reveals no gallop and no friction rub.   No murmur heard. Pulmonary/Chest: Effort normal and breath sounds normal.  Abdominal: Soft. Bowel sounds are normal. He exhibits no distension.      Data Reviewed *RADIOLOGY REPORT*  Clinical Data: Left-sided abdominal pain. Rule out hernia or  diverticulitis.  CT ABDOMEN AND PELVIS WITH CONTRAST  Technique: Multidetector CT imaging of the  abdomen and pelvis was  performed following the standard protocol during bolus  administration of intravenous contrast.  Contrast: OMNIPAQUE IOHEXOL 300 MG/ML SOLN  Comparison: None.  Findings: Lung bases are clear. Liver and bile ducts are normal.  Gallbladder not visualized and may be surgically absent. Pancreas  is fatty replaced without evidence of mass or pancreatitis.  Kidneys and spleen are normal.  Negative for bowel obstruction or bowel thickening. Negative for  diverticulitis. Negative for hernia. Prostate is moderately  enlarged.  IMPRESSION:  No acute abnormality.   Assessment    Mass 15 cm abdominal wall    Plan    Discussed options of observation versus excision given normal CT scan. The patient would like to proceed with excision of the abdominal wall mass because it is causing pain and discomfort.The procedure has been discussed with the patient.  Alternative therapies have been discussed with the patient.  Operative risks include bleeding,  Infection,  Organ injury,  Nerve injury,  Blood vessel injury,  DVT,  Pulmonary embolism,  Death,  And possible reoperation.  Medical management risks include worsening of present situation.  The success of the procedure is 50 -90 % at treating patients symptoms.  The patient understands and agrees to proceed.       Leannah Guse A. 04/25/2012, 4:13 PM

## 2012-04-25 NOTE — Patient Instructions (Signed)
Lipoma A lipoma is a noncancerous (benign) tumor composed of fat cells. They are usually found under the skin (subcutaneous). A lipoma may occur in any tissue of the body that contains fat. Common areas for lipomas to appear include the back, shoulders, buttocks, and thighs. Lipomas are a very common soft tissue growth. They are soft and grow slowly. Most problems caused by a lipoma depend on where it is growing. DIAGNOSIS  A lipoma can be diagnosed with a physical exam. These tumors rarely become cancerous, but radiographic studies can help determine this for certain. Studies used may include:  Computerized X-ray scans (CT or CAT scan).   Computerized magnetic scans (MRI).  TREATMENT  Small lipomas that are not causing problems may be watched. If a lipoma continues to enlarge or causes problems, removal is often the best treatment. Lipomas can also be removed to improve appearance. Surgery is done to remove the fatty cells and the surrounding capsule. Most often, this is done with medicine that numbs the area (local anesthetic). The removed tissue is examined under a microscope to make sure it is not cancerous. Keep all follow-up appointments with your caregiver. SEEK MEDICAL CARE IF:   The lipoma becomes larger or hard.   The lipoma becomes painful, red, or increasingly swollen. These could be signs of infection or a more serious condition.  Document Released: 07/21/2002 Document Revised: 07/20/2011 Document Reviewed: 12/31/2009 [employer] Patient Information 2012 [employer]. 

## 2012-04-30 ENCOUNTER — Ambulatory Visit (INDEPENDENT_AMBULATORY_CARE_PROVIDER_SITE_OTHER): Payer: Commercial Managed Care - PPO | Admitting: Surgery

## 2012-05-02 NOTE — Assessment & Plan Note (Signed)
Increase metformin 1000mg  bid. Extend samples of januvia. Call fsbs report to clinic in 1.5 weeks

## 2012-05-02 NOTE — Progress Notes (Signed)
  Subjective:    Patient ID: KAUAN KLOOSTERMAN, male    DOB: [birth year], 56 y.o.   MRN: [id number]  HPI Pt presents to clinic for followup of multiple medical problems. Scheduled to see surgery tomorrow for painful soft tissue mass of abdomen. Reports avg fsbs mid 200's without hypoglycemia. Total time of visit ~20 mins of which >50% of time spent in counseling.  Past Medical History  Diagnosis Date  . Diabetes mellitus   . Hypertension   . History of chicken pox     childhood  . Seasonal allergies   . History of kidney stones   . Hyperlipidemia    Past Surgical History  Procedure Date  . Hernia repair   . Abdominal hernia repair   . Rotator cuff repair   . Cholecystectomy     reports that he has never smoked. He has never used smokeless tobacco. He reports that he does not drink alcohol or use illicit drugs. family history includes Cancer in his paternal grandmother; Diabetes in his maternal aunt; and Lung cancer in his mother.  There is no history of Breast cancer, and Colon cancer, and Prostate cancer, and Heart disease, . Allergies  Allergen Reactions  . Contrast Media (Iodinated Diagnostic Agents)   . Iodine Swelling      Review of Systems see hpi     Objective:   Physical Exam  Nursing note and vitals reviewed. Constitutional: He appears well-developed and well-nourished. No distress.  Skin: He is not diaphoretic.          Assessment & Plan:

## 2012-05-06 ENCOUNTER — Telehealth: Payer: Self-pay | Admitting: *Deleted

## 2012-05-06 NOTE — Telephone Encounter (Signed)
Pt states he was to call with [facility] report log: highest has been 200 [x2], 192, 189, 156, 184, 179, 164, 197, 191, [Sat] 120, [Sun] 118, today after meal 194/SLS

## 2012-05-06 NOTE — Telephone Encounter (Signed)
Based on sat/sun values would continue current medication.

## 2012-05-06 NOTE — Telephone Encounter (Signed)
Patient informed/SLS  

## 2012-05-09 ENCOUNTER — Ambulatory Visit (INDEPENDENT_AMBULATORY_CARE_PROVIDER_SITE_OTHER): Payer: Commercial Managed Care - PPO | Admitting: Surgery

## 2012-05-20 NOTE — Progress Notes (Signed)
To come in for [facility] labs,cxr,[facility] [employer] [facility]

## 2012-05-21 ENCOUNTER — Ambulatory Visit
Admission: RE | Admit: 2012-05-21 | Discharge: 2012-05-21 | Disposition: A | Discharge status: Home / Self Care | Payer: 59 | Source: Ambulatory Visit | Attending: Surgery | Admitting: Surgery

## 2012-05-21 ENCOUNTER — Encounter (HOSPITAL_BASED_OUTPATIENT_CLINIC_OR_DEPARTMENT_OTHER)
Admission: RE | Admit: 2012-05-21 | Discharge: 2012-05-21 | Disposition: A | Discharge status: Home / Self Care | Payer: 59 | Source: Ambulatory Visit | Attending: Surgery | Admitting: Surgery

## 2012-05-21 LAB — CBC WITH DIFFERENTIAL/PLATELET
Basophils Relative: 1 % (ref 0–1)
HCT: 39.9 % (ref 39.0–52.0)
Hemoglobin: 14 g/dL (ref 13.0–17.0)
Lymphocytes Relative: 31 % (ref 12–46)
MCHC: 35.1 g/dL (ref 30.0–36.0)
Monocytes Absolute: 0.6 10*3/uL (ref 0.1–1.0)
Monocytes Relative: 9 % (ref 3–12)
Neutro Abs: 3.8 10*3/uL (ref 1.7–7.7)
Neutrophils Relative %: 54 % (ref 43–77)
RBC: 4.65 MIL/uL (ref 4.22–5.81)
WBC: 7 10*3/uL (ref 4.0–10.5)

## 2012-05-21 LAB — COMPREHENSIVE METABOLIC PANEL
AST: 18 U/L (ref 0–37)
Albumin: 4.2 g/dL (ref 3.5–5.2)
Alkaline Phosphatase: 76 U/L (ref 39–117)
BUN: 11 mg/dL (ref 6–23)
CO2: 26 mEq/L (ref 19–32)
Chloride: 100 mEq/L (ref 96–112)
Creatinine, Ser: 0.6 mg/dL (ref 0.50–1.35)
GFR calc non Af Amer: >90 mL/min (ref >90)
Potassium: 4.3 mEq/L (ref 3.5–5.1)
Total Bilirubin: 0.3 mg/dL (ref 0.3–1.2)

## 2012-05-22 ENCOUNTER — Encounter (HOSPITAL_BASED_OUTPATIENT_CLINIC_OR_DEPARTMENT_OTHER): Payer: Self-pay | Admitting: Anesthesiology

## 2012-05-22 ENCOUNTER — Ambulatory Visit (HOSPITAL_BASED_OUTPATIENT_CLINIC_OR_DEPARTMENT_OTHER)
Admission: RE | Admit: 2012-05-22 | Discharge: 2012-05-22 | Disposition: A | Discharge status: Home / Self Care | Payer: 59 | Source: Ambulatory Visit | Attending: Surgery | Admitting: Surgery

## 2012-05-22 ENCOUNTER — Encounter (HOSPITAL_BASED_OUTPATIENT_CLINIC_OR_DEPARTMENT_OTHER): Payer: Self-pay | Admitting: *Deleted

## 2012-05-22 ENCOUNTER — Encounter (HOSPITAL_BASED_OUTPATIENT_CLINIC_OR_DEPARTMENT_OTHER)
Admission: RE | Disposition: A | Discharge status: Home / Self Care | Payer: Self-pay | Source: Ambulatory Visit | Attending: Surgery

## 2012-05-22 ENCOUNTER — Ambulatory Visit (HOSPITAL_BASED_OUTPATIENT_CLINIC_OR_DEPARTMENT_OTHER): Payer: 59 | Admitting: Anesthesiology

## 2012-05-22 DIAGNOSIS — E785 Hyperlipidemia, unspecified: Secondary | ICD-10-CM

## 2012-05-22 DIAGNOSIS — M5416 Radiculopathy, lumbar region: Secondary | ICD-10-CM

## 2012-05-22 DIAGNOSIS — I10 Essential (primary) hypertension: Secondary | ICD-10-CM | POA: Insufficient documentation

## 2012-05-22 DIAGNOSIS — K469 Unspecified abdominal hernia without obstruction or gangrene: Secondary | ICD-10-CM

## 2012-05-22 DIAGNOSIS — E119 Type 2 diabetes mellitus without complications: Secondary | ICD-10-CM

## 2012-05-22 DIAGNOSIS — Z01812 Encounter for preprocedural laboratory examination: Secondary | ICD-10-CM | POA: Insufficient documentation

## 2012-05-22 DIAGNOSIS — Z01810 Encounter for preprocedural cardiovascular examination: Secondary | ICD-10-CM | POA: Insufficient documentation

## 2012-05-22 DIAGNOSIS — D1739 Benign lipomatous neoplasm of skin and subcutaneous tissue of other sites: Secondary | ICD-10-CM | POA: Insufficient documentation

## 2012-05-22 HISTORY — PX: MASS EXCISION: SHX2000

## 2012-05-22 SURGERY — EXCISION MASS
Anesthesia: General | Site: Abdomen | Laterality: Left | Wound class: Clean

## 2012-05-22 MED ORDER — OXYCODONE HCL 5 MG PO TABS: 5.0000 mg | ORAL_TABLET | Freq: Once | ORAL | Status: DC | PRN

## 2012-05-22 MED ORDER — CHLORHEXIDINE GLUCONATE 4 % EX LIQD: 1.0000 "application " | Freq: Once | CUTANEOUS | Status: DC

## 2012-05-22 MED ORDER — ONDANSETRON HCL 4 MG/2ML IJ SOLN
INTRAMUSCULAR | Status: DC | PRN
  Administered 2012-05-22: 4 mg via INTRAVENOUS

## 2012-05-22 MED ORDER — HYDROMORPHONE HCL PF 1 MG/ML IJ SOLN
0.2500 mg | INTRAMUSCULAR | Status: DC | PRN
  Administered 2012-05-22 (×2): 0.5 mg via INTRAVENOUS

## 2012-05-22 MED ORDER — BUPIVACAINE-EPINEPHRINE 0.25% -1:200000 IJ SOLN
INTRAMUSCULAR | Status: DC | PRN
  Administered 2012-05-22: 30 mL

## 2012-05-22 MED ORDER — LIDOCAINE HCL (CARDIAC) 20 MG/ML IV SOLN
INTRAVENOUS | Status: DC | PRN
  Administered 2012-05-22: 70 mg via INTRAVENOUS

## 2012-05-22 MED ORDER — MIDAZOLAM HCL 5 MG/5ML IJ SOLN
INTRAMUSCULAR | Status: DC | PRN
  Administered 2012-05-22: 2 mg via INTRAVENOUS

## 2012-05-22 MED ORDER — FENTANYL CITRATE 0.05 MG/ML IJ SOLN
INTRAMUSCULAR | Status: DC | PRN
  Administered 2012-05-22: 100 ug via INTRAVENOUS

## 2012-05-22 MED ORDER — LACTATED RINGERS IV SOLN
INTRAVENOUS | Status: DC
  Administered 2012-05-22 (×2): via INTRAVENOUS

## 2012-05-22 MED ORDER — PROPOFOL 10 MG/ML IV BOLUS
INTRAVENOUS | Status: DC | PRN
  Administered 2012-05-22: 200 mg via INTRAVENOUS

## 2012-05-22 MED ORDER — DEXTROSE 5 % IV SOLN
3.0000 g | INTRAVENOUS | Status: AC
  Administered 2012-05-22: 3 g via INTRAVENOUS

## 2012-05-22 MED ORDER — OXYCODONE-ACETAMINOPHEN 5-325 MG PO TABS: 1.0000 | ORAL_TABLET | ORAL | Status: DC | PRN

## 2012-05-22 MED ORDER — OXYCODONE HCL 5 MG/5ML PO SOLN: 5.0000 mg | Freq: Once | ORAL | Status: DC | PRN

## 2012-05-22 MED ORDER — DEXAMETHASONE SODIUM PHOSPHATE 4 MG/ML IJ SOLN
INTRAMUSCULAR | Status: DC | PRN
  Administered 2012-05-22: 8 mg via INTRAVENOUS

## 2012-05-22 SURGICAL SUPPLY — 47 items
BENZOIN TINCTURE PRP APPL 2/3 (GAUZE/BANDAGES/DRESSINGS) IMPLANT
BLADE SURG 10 STRL SS (BLADE) IMPLANT
BLADE SURG 15 STRL LF DISP TIS (BLADE) ×1 IMPLANT
BLADE SURG 15 STRL SS (BLADE) ×1
BLADE SURG ROTATE 9660 (MISCELLANEOUS) ×2 IMPLANT
CANISTER SUCTION 1200CC (MISCELLANEOUS) ×2 IMPLANT
CHLORAPREP W/TINT 26ML (MISCELLANEOUS) ×2 IMPLANT
COVER MAYO STAND STRL (DRAPES) ×2 IMPLANT
COVER TABLE BACK 60X90 (DRAPES) ×2 IMPLANT
DECANTER SPIKE VIAL GLASS SM (MISCELLANEOUS) IMPLANT
DERMABOND ADVANCED (GAUZE/BANDAGES/DRESSINGS) ×1
DERMABOND ADVANCED .7 DNX12 (GAUZE/BANDAGES/DRESSINGS) ×1 IMPLANT
DRAIN CHANNEL 19F RND (DRAIN) ×2 IMPLANT
DRAPE PED LAPAROTOMY (DRAPES) ×2 IMPLANT
DRAPE UTILITY XL STRL (DRAPES) ×2 IMPLANT
ELECT COATED BLADE 2.86 ST (ELECTRODE) ×2 IMPLANT
ELECT REM PT RETURN 9FT ADLT (ELECTROSURGICAL) ×2
ELECTRODE REM PT RTRN 9FT ADLT (ELECTROSURGICAL) ×1 IMPLANT
EVACUATOR SILICONE 100CC (DRAIN) ×2 IMPLANT
GAUZE SPONGE 4X4 12PLY STRL LF (GAUZE/BANDAGES/DRESSINGS) ×2 IMPLANT
GLOVE BIOGEL PI IND STRL 8 (GLOVE) ×1 IMPLANT
GLOVE BIOGEL PI INDICATOR 8 (GLOVE) ×1
GLOVE ECLIPSE 6.5 STRL STRAW (GLOVE) ×2 IMPLANT
GLOVE ECLIPSE 8.0 STRL XLNG CF (GLOVE) ×2 IMPLANT
GOWN PREVENTION PLUS XLARGE (GOWN DISPOSABLE) ×4 IMPLANT
NEEDLE HYPO 25X1 1.5 SAFETY (NEEDLE) ×2 IMPLANT
NS IRRIG 1000ML POUR BTL (IV SOLUTION) ×2 IMPLANT
PACK BASIN DAY SURGERY FS (CUSTOM PROCEDURE TRAY) ×2 IMPLANT
PENCIL BUTTON HOLSTER BLD 10FT (ELECTRODE) ×2 IMPLANT
SLEEVE SCD COMPRESS KNEE MED (MISCELLANEOUS) ×2 IMPLANT
SPONGE LAP 4X18 X RAY DECT (DISPOSABLE) IMPLANT
STAPLER VISISTAT 35W (STAPLE) IMPLANT
STRIP CLOSURE SKIN 1/2X4 (GAUZE/BANDAGES/DRESSINGS) IMPLANT
SUT ETHILON 2 0 FS 18 (SUTURE) ×2 IMPLANT
SUT MNCRL AB 3-0 PS2 18 (SUTURE) ×2 IMPLANT
SUT MON AB 4-0 PC3 18 (SUTURE) IMPLANT
SUT VIC AB 2-0 SH 18 (SUTURE) ×2 IMPLANT
SUT VIC AB 3-0 SH 27 (SUTURE)
SUT VIC AB 3-0 SH 27X BRD (SUTURE) IMPLANT
SUT VICRYL AB 3 0 TIES (SUTURE) IMPLANT
SYR CONTROL 10ML LL (SYRINGE) ×2 IMPLANT
TAPE CLOTH SURG 4X10 WHT LF (GAUZE/BANDAGES/DRESSINGS) ×2 IMPLANT
TOWEL OR 17X24 6PK STRL BLUE (TOWEL DISPOSABLE) ×2 IMPLANT
TOWEL OR NON WOVEN STRL DISP B (DISPOSABLE) ×2 IMPLANT
TUBE CONNECTING 20X1/4 (TUBING) ×2 IMPLANT
WATER STERILE IRR 1000ML POUR (IV SOLUTION) IMPLANT
YANKAUER SUCT BULB TIP NO VENT (SUCTIONS) ×2 IMPLANT

## 2012-05-22 NOTE — Anesthesia Preprocedure Evaluation (Signed)
Anesthesia Evaluation  Patient identified by MRN, date of birth, ID band Patient awake    Reviewed: Allergy & Precautions, H&P , NPO status , Patient's Chart, lab work & pertinent test results  Airway Mallampati: II TM Distance: >3 FB Neck ROM: Full    Dental No notable dental hx. (+) Loose, Dental Advisory Given and Teeth Intact   Pulmonary neg pulmonary ROS,  breath sounds clear to auscultation  Pulmonary exam normal       Cardiovascular negative cardio ROS  Rhythm:Regular Rate:Normal     Neuro/Psych negative neurological ROS  negative psych ROS   GI/Hepatic negative GI ROS, Neg liver ROS,   Endo/Other  diabetes, Well Controlled, Type 2, Oral Hypoglycemic Agents  Renal/GU negative Renal ROS  negative genitourinary   Musculoskeletal   Abdominal   Peds  Hematology negative hematology ROS (+)   Anesthesia Other Findings   Reproductive/Obstetrics negative OB ROS                           Anesthesia Physical Anesthesia Plan  ASA: II  Anesthesia Plan: General   Post-op Pain Management:    Induction: Intravenous  Airway Management Planned: LMA  Additional Equipment:   Intra-op Plan:   Post-operative Plan: Extubation in OR  Informed Consent: I have reviewed the patients History and Physical, chart, labs and discussed the procedure including the risks, benefits and alternatives for the proposed anesthesia with the patient or authorized representative who has indicated his/her understanding and acceptance.   Dental advisory given  Plan Discussed with: CRNA  Anesthesia Plan Comments:         Anesthesia Quick Evaluation

## 2012-05-22 NOTE — Anesthesia Postprocedure Evaluation (Signed)
  Anesthesia Post-op Note  Patient: Albert [name]  Procedure(s) Performed: Procedure(s) (LRB) with comments: EXCISION MASS (Left) - excision abdominal wall mass  Patient Location: PACU  Anesthesia Type: General  Level of Consciousness: awake and alert   Airway and Oxygen Therapy: Patient Spontanous Breathing  Post-op Pain: mild  Post-op Assessment: Post-op Vital signs reviewed, Patient's Cardiovascular Status Stable, Respiratory Function Stable, Patent Airway, No signs of Nausea or vomiting and Pain level controlled  Post-op Vital Signs: stable  Complications: No apparent anesthesia complications

## 2012-05-22 NOTE — Interval H&P Note (Signed)
History and Physical Interval Note:  05/22/2012 1:23 PM  [name]  has presented today for surgery, with the diagnosis of abdominal wall mass  The various methods of treatment have been discussed with the patient and family. After consideration of risks, benefits and other options for treatment, the patient has consented to  Procedure(s) (LRB) with comments: EXCISION MASS (N/A) - excision abdominal mass mass as a surgical intervention .  The patient's history has been reviewed, patient examined, no change in status, stable for surgery.  I have reviewed the patient's chart and labs.  Questions were answered to the patient's satisfaction.     Albert [name] A.

## 2012-05-22 NOTE — Transfer of Care (Signed)
Immediate Anesthesia Transfer of Care Note  Patient: Albert [name]  Procedure(s) Performed: Procedure(s) (LRB) with comments: EXCISION MASS (Left) - excision abdominal wall mass  Patient Location: PACU  Anesthesia Type: General  Level of Consciousness: awake and alert   Airway & Oxygen Therapy: Patient Spontanous Breathing and Patient connected to face mask oxygen  Post-op Assessment: Report given to PACU RN and Post -op Vital signs reviewed and stable  Post vital signs: Reviewed and stable  Complications: No apparent anesthesia complications

## 2012-05-22 NOTE — Anesthesia Procedure Notes (Signed)
Procedure Name: LMA Insertion Performed by: Lance Coon Pre-anesthesia Checklist: Patient identified, Timeout performed, Emergency Drugs available, Suction available and Patient being monitored Patient Re-evaluated:Patient Re-evaluated prior to inductionOxygen Delivery Method: Circle system utilized Preoxygenation: Pre-oxygenation with 100% oxygen Intubation Type: IV induction Ventilation: Mask ventilation without difficulty LMA: LMA inserted LMA Size: 5.0 Number of attempts: 1 Placement Confirmation: breath sounds checked- equal and bilateral and positive ETCO2 Dental Injury: Teeth and Oropharynx as per pre-operative assessment

## 2012-05-22 NOTE — Anesthesia Postprocedure Evaluation (Signed)
  Anesthesia Post-op Note  Patient: Albert [name]  Procedure(s) Performed: Procedure(s) (LRB) with comments: EXCISION MASS (Left) - excision abdominal wall mass  Patient Location: PACU  Anesthesia Type: General  Level of Consciousness: awake and alert   Airway and Oxygen Therapy: Patient Spontanous Breathing and Patient connected to face mask oxygen  Post-op Pain: moderate  Post-op Assessment: Post-op Vital signs reviewed, Patient's Cardiovascular Status Stable, Respiratory Function Stable, Patent Airway and No signs of Nausea or vomiting  Post-op Vital Signs: Reviewed and stable  Complications: No apparent anesthesia complications

## 2012-05-22 NOTE — H&P (View-Only) (Signed)
[name], male   DOB: 09/15/1955, 56 y.o.   MRN: [id number]  No chief complaint on file.   [name] is a 56 y.o. male.  Patient sent at the request of Dr.Hodgin Jr, Adelei Scobey Whitson, MD do to abdominal wall mass located left upper quadrant. It has been present for multiple months and is getting larger. It is tender to palpate and he has shooting pains from time to time sharp in nature lasting minutes to hours through the mass. Activity does not seem to worsen the pain. HPI  Past Medical History  Diagnosis Date  . Diabetes mellitus   . Hypertension   . History of chicken pox     childhood  . Seasonal allergies   . History of kidney stones   . Hyperlipidemia     Past Surgical History  Procedure Date  . Hernia repair   . Abdominal hernia repair   . Rotator cuff repair   . Cholecystectomy     Family History  Problem Relation Age of Onset  . Lung cancer Mother   . Breast cancer Neg Hx   . Colon cancer Neg Hx   . Prostate cancer Neg Hx   . Heart disease Neg Hx   . Diabetes Maternal Aunt   . Cancer Paternal Grandmother     Social History History  Substance Use Topics  . Smoking status: Never Smoker   . Smokeless tobacco: Never Used  . Alcohol Use: No    Allergies  Allergen Reactions  . Contrast Media (Iodinated Diagnostic Agents)   . Iodine Swelling    Current Outpatient Prescriptions  Medication Sig Dispense Refill  . atorvastatin (LIPITOR) 20 MG tablet Take 1 tablet (20 mg total) by mouth daily.  30 tablet  6  . cetirizine (ZYRTEC) 10 MG tablet Take 10 mg by mouth daily.      . cyclobenzaprine (FLEXERIL) 10 MG tablet Take 10 mg by mouth daily.      . ibuprofen (ADVIL,MOTRIN) 200 MG tablet Take 400 mg by mouth as needed. Patient used this medication for his pain.      . Liniments (BLUE-EMU SUPER STRENGTH EX) Apply 1 application topically every morning. Patient used this medication on his leg for pain.      . metFORMIN (GLUCOPHAGE) 1000 MG  tablet Take 1 tablet (1,000 mg total) by mouth 2 (two) times daily with a meal.  180 tablet  3  . MULTIPLE VITAMINS PO Take by mouth.      . nabumetone (RELAFEN) 500 MG tablet Take 1,000 mg by mouth 2 (two) times daily.        Review of Systems Review of Systems  Constitutional: Negative for fever, chills and unexpected weight change.  HENT: Negative for hearing loss, congestion, sore throat, trouble swallowing and voice change.   Eyes: Negative for visual disturbance.  Respiratory: Negative for cough and wheezing.   Cardiovascular: Negative for chest pain, palpitations and leg swelling.  Gastrointestinal: Negative for nausea, vomiting, abdominal pain, diarrhea, constipation, blood in stool, abdominal distention, anal bleeding and rectal pain.  Genitourinary: Negative for hematuria and difficulty urinating.  Musculoskeletal: Negative for arthralgias.  Skin: Negative for rash and wound.  Neurological: Negative for seizures, syncope, weakness and headaches.  Hematological: Negative for adenopathy. Does not bruise/bleed easily.  Psychiatric/Behavioral: Negative for confusion.    Blood pressure 122/86, pulse 80, resp. rate 14, height 6' (1.829 m), weight 192 lb 5.1 oz (87.236 kg).  Physical Exam Physical Exam    Constitutional: He appears well-developed and well-nourished.  HENT:  Head: Normocephalic and atraumatic.  Eyes: EOM are normal. Pupils are equal, round, and reactive to light.  Neck: Normal range of motion. Neck supple.  Cardiovascular: Normal rate, regular rhythm and normal pulses.  Exam reveals no gallop and no friction rub.   No murmur heard. Pulmonary/Chest: Effort normal and breath sounds normal.  Abdominal: Soft. Bowel sounds are normal. He exhibits no distension.      Data Reviewed *RADIOLOGY REPORT*  Clinical Data: Left-sided abdominal pain. Rule out hernia or  diverticulitis.  CT ABDOMEN AND PELVIS WITH CONTRAST  Technique: Multidetector CT imaging of the  abdomen and pelvis was  performed following the standard protocol during bolus  administration of intravenous contrast.  Contrast: 100mL OMNIPAQUE IOHEXOL 300 MG/ML SOLN  Comparison: None.  Findings: Lung bases are clear. Liver and bile ducts are normal.  Gallbladder not visualized and may be surgically absent. Pancreas  is fatty replaced without evidence of mass or pancreatitis.  Kidneys and spleen are normal.  Negative for bowel obstruction or bowel thickening. Negative for  diverticulitis. Negative for hernia. Prostate is moderately  enlarged.  IMPRESSION:  No acute abnormality.   Assessment    Mass 15 cm abdominal wall    Plan    Discussed options of observation versus excision given normal CT scan. The patient would like to proceed with excision of the abdominal wall mass because it is causing pain and discomfort.The procedure has been discussed with the patient.  Alternative therapies have been discussed with the patient.  Operative risks include bleeding,  Infection,  Organ injury,  Nerve injury,  Blood vessel injury,  DVT,  Pulmonary embolism,  Death,  And possible reoperation.  Medical management risks include worsening of present situation.  The success of the procedure is 50 -90 % at treating patients symptoms.  The patient understands and agrees to proceed.       Albert [name] A. 04/25/2012, 4:13 PM    

## 2012-05-22 NOTE — Op Note (Signed)
Preoperative Diagnosis: mass abdomen 5 cm   Post operative diagnosis:  Same   Procedure: Excision of 5 cm abdominal wall mass subcutaneous and stitch removal  Surgeon: Dortha Schwalbe MD   Anesthesia: LMA and 0. 25 % sensoricaine  With epinephrine   EBL: minimal  Specimen:  Probable lipoma  Drains:  19 f    Indications:  Pt has painful mass abdominal wall LUQ that is enlarging and causing pain.  He wishes to have it removed.  The procedure has been discussed with the patient.  Alternative therapies have been discussed with the patient.  Operative risks include bleeding,  Infection,  Organ injury,  Nerve injury,  Blood vessel injury,  DVT,  Pulmonary embolism,  Death,  And possible reoperation.  Medical management risks include worsening of present situation.  The success of the procedure is 50 -90 % at treating patients symptoms.  The patient understands and agrees to proceed.    Description:  Pt seen in holding area and questions were answered.  The area was marked on abdominal wall.  He was placed supine on the OR table.  General anesthesia was induced and the abdomen was prepped and draped in a sterile fashion.  Mass was palpated in LUQ abdomen.  Incision made and the fat appeared lobulated like lipoma and measured 5 cm.  There was also and old suture in the abdominal wall that was removed as well.  The cavity was irrigated and made hemostatic.  Drain placed and secured.  Wound closed in layers with 3 O vicryl and 3 O monocryl.  Dermabond applied.  All counts correct.  Loca anesthesia injected.  Pt awoke and taken to recovery in satisfactory condition.

## 2012-05-22 NOTE — Progress Notes (Signed)
All documentation since admission done by Birgitta Uhlir,RN. 

## 2012-05-23 ENCOUNTER — Other Ambulatory Visit: Payer: Self-pay | Admitting: Internal Medicine

## 2012-05-23 ENCOUNTER — Encounter (HOSPITAL_BASED_OUTPATIENT_CLINIC_OR_DEPARTMENT_OTHER): Payer: Self-pay | Admitting: Surgery

## 2012-05-23 NOTE — Telephone Encounter (Signed)
Please advise re: testing frequency, BS readings and refill on Januvia as I do not see this on his current med list.

## 2012-05-23 NOTE — Telephone Encounter (Signed)
Ok for [employer]. Ok for fsbs tid. May need to override -dx hyperglycemia

## 2012-05-23 NOTE — Telephone Encounter (Signed)
Patient called in with blood sugar readings  9.30.13 Fasting breakfast 125  After dinner 202 10.1.13 After breakfast 168 10.2.13     After dinner 157 10.3.13     After dinner 141 10.7.13     Before dinner 161 10.8.13 After breakfast 149 10.9.13 Before breakfast 132   After Surgery 296 10.10.13 After breakfast 197  Patient states that he feels a little sluggish after his surgery. He states that he is "taking it easy and relaxing". He did want Dr. 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   Also, patient states that he is doing well on januvia 100mg  and would like a refill sent to [facility].

## 2012-05-23 NOTE — Telephone Encounter (Signed)
Received message from pharmacy requesting rx for one touch ultra test strips. States pt is checking BS 3-4 x daily and would # 100 strips.  Is it ok to send Rx with these directions and quantity?

## 2012-05-24 MED ORDER — SITAGLIPTIN PHOSPHATE 100 MG PO TABS: 100.0000 mg | ORAL_TABLET | Freq: Every day | ORAL | Status: DC

## 2012-05-24 MED ORDER — GLUCOSE BLOOD VI STRP: ORAL_STRIP | Status: DC

## 2012-05-24 NOTE — Telephone Encounter (Signed)
Verbal given to [employer] at [facility]. For:  onetouch ultra test strips #100 x 2 refills. Test blood sugar 3 times daily.  Januvia 100mg  1 daily #30 x 2 refills.

## 2012-05-24 NOTE — Addendum Note (Signed)
Addended by: Mervin Kung A on: 05/24/2012 12:11 PM   Modules accepted: Orders

## 2012-05-27 ENCOUNTER — Encounter (HOSPITAL_BASED_OUTPATIENT_CLINIC_OR_DEPARTMENT_OTHER): Payer: Self-pay

## 2012-05-29 ENCOUNTER — Ambulatory Visit (INDEPENDENT_AMBULATORY_CARE_PROVIDER_SITE_OTHER): Payer: Commercial Managed Care - PPO

## 2012-05-29 ENCOUNTER — Encounter (INDEPENDENT_AMBULATORY_CARE_PROVIDER_SITE_OTHER): Payer: Self-pay

## 2012-05-29 ENCOUNTER — Other Ambulatory Visit (INDEPENDENT_AMBULATORY_CARE_PROVIDER_SITE_OTHER): Payer: Self-pay

## 2012-05-29 DIAGNOSIS — Z4803 Encounter for change or removal of drains: Secondary | ICD-10-CM

## 2012-05-29 DIAGNOSIS — Z4889 Encounter for other specified surgical aftercare: Secondary | ICD-10-CM

## 2012-05-29 MED ORDER — HYDROCODONE-ACETAMINOPHEN 5-325 MG PO TABS: 1.0000 | ORAL_TABLET | Freq: Four times a day (QID) | ORAL | Status: DC | PRN

## 2012-05-29 NOTE — Progress Notes (Signed)
Patient came in for drain removal from mass excision on 10/9. Incision looks good. No signs of infection. Stitch was cut and drain was removed without difficulty. Dry guaze was applied to the area. Protocol hydrocodone was called into pharmacy. Patient will follow up with Dr. Luisa Hart on 10/25. He will call with any questions or concerns in the meantime.

## 2012-06-03 ENCOUNTER — Ambulatory Visit (INDEPENDENT_AMBULATORY_CARE_PROVIDER_SITE_OTHER): Payer: 59 | Admitting: Internal Medicine

## 2012-06-03 ENCOUNTER — Telehealth: Payer: Self-pay | Admitting: Internal Medicine

## 2012-06-03 ENCOUNTER — Encounter: Payer: Self-pay | Admitting: Internal Medicine

## 2012-06-03 VITALS — BP 118/76 | HR 83 | Temp 98.0°F | Resp 16 | Ht 72.0 in | Wt 188.8 lb

## 2012-06-03 DIAGNOSIS — E119 Type 2 diabetes mellitus without complications: Secondary | ICD-10-CM

## 2012-06-03 DIAGNOSIS — Z79899 Other long term (current) drug therapy: Secondary | ICD-10-CM

## 2012-06-03 DIAGNOSIS — E785 Hyperlipidemia, unspecified: Secondary | ICD-10-CM

## 2012-06-03 NOTE — Assessment & Plan Note (Signed)
Improving control. Continue current regimen. Encouraged exercise.

## 2012-06-03 NOTE — Progress Notes (Signed)
  Subjective:    [name], male    DOB: November 25, 1955, 56 y.o.   MRN: [id number]  HPI Pt presents to clinic for followup of multiple medical problems. Area of abdominal protrusion initially suspected to be hernia now status post resection of large lipoma. Has surgical followup in the near future. Reviewed blood sugars now improving to approximately mid 100s or less. No hypoglycemia. Tolerating combination of metformin and Januvia without side effect. Weight down 10 pounds since last visit. Plans on resuming exercise in the near future. Flu shot up-to-date at work.  Past Medical History  Diagnosis Date  . Diabetes mellitus   . Hypertension   . History of chicken pox     childhood  . Seasonal allergies   . History of kidney stones   . Hyperlipidemia    Past Surgical History  Procedure Date  . Hernia repair   . Abdominal hernia repair   . Rotator cuff repair   . Cholecystectomy   . Mass excision 05/22/2012    Procedure: EXCISION MASS;  Surgeon: [name], MD;  Location: [facility];  Service: General;  Laterality: Left;  excision abdominal wall mass    reports that he has never smoked. He has never used smokeless tobacco. He reports that he does not drink alcohol or use illicit drugs. family history includes Cancer in his paternal grandmother; Diabetes in his maternal aunt; and Lung cancer in his mother.  There is no history of Breast cancer, and Colon cancer, and Prostate cancer, and Heart disease, . Allergies  Allergen Reactions  . Contrast Media (Iodinated Diagnostic Agents)   . Iodine Swelling      Review of Systems see hpi     Objective:   Physical Exam  Nursing note and vitals reviewed. Constitutional: He appears well-developed and well-nourished. No distress.  Neurological: He is alert.  Skin: He is not diaphoretic.  Psychiatric: He has a normal mood and affect.          Assessment & Plan:

## 2012-06-03 NOTE — Patient Instructions (Signed)
Please schedule fasting labs prior to next visit Cbc, chem7, a1c-250.00 and lipid/lft-272.4 

## 2012-06-03 NOTE — Assessment & Plan Note (Signed)
Stable. Schedule fasting lipid profile and liver function test prior to next visit

## 2012-06-03 NOTE — Telephone Encounter (Signed)
Please schedule fasting labs prior to next visit  Cbc, chem7, a1c-250.00 and lipid/lft-272.4  Patient has upcoming appointment on 08/15/12. He will be going to [facility] lab.

## 2012-06-07 ENCOUNTER — Encounter (INDEPENDENT_AMBULATORY_CARE_PROVIDER_SITE_OTHER): Payer: Self-pay | Admitting: Surgery

## 2012-06-07 ENCOUNTER — Ambulatory Visit (INDEPENDENT_AMBULATORY_CARE_PROVIDER_SITE_OTHER): Payer: Commercial Managed Care - PPO | Admitting: Surgery

## 2012-06-07 VITALS — BP 122/82 | HR 74 | Temp 97.3°F | Resp 16 | Ht 72.0 in | Wt 185.8 lb

## 2012-06-07 DIAGNOSIS — Z9889 Other specified postprocedural states: Secondary | ICD-10-CM

## 2012-06-07 NOTE — Patient Instructions (Signed)
Return as needed

## 2012-06-07 NOTE — Progress Notes (Signed)
Patient returns after excision of left abdominal wall lipoma. He has no complaints is doing well.  Exam: Slight cosmetic defect left abdominal wall. No signs of infection. No seroma.  Impression: Lipoma left abdominal wall verified by pathology  Plan: Return as needed. Resume full activity

## 2012-08-05 ENCOUNTER — Encounter (HOSPITAL_COMMUNITY): Payer: Self-pay | Admitting: Emergency Medicine

## 2012-08-05 ENCOUNTER — Emergency Department (HOSPITAL_COMMUNITY)
Admission: EM | Admit: 2012-08-05 | Discharge: 2012-08-05 | Disposition: A | Discharge status: Home / Self Care | Payer: PRIVATE HEALTH INSURANCE | Attending: Emergency Medicine | Admitting: Emergency Medicine

## 2012-08-05 DIAGNOSIS — I10 Essential (primary) hypertension: Secondary | ICD-10-CM | POA: Insufficient documentation

## 2012-08-05 DIAGNOSIS — Z8619 Personal history of other infectious and parasitic diseases: Secondary | ICD-10-CM | POA: Insufficient documentation

## 2012-08-05 DIAGNOSIS — X088XXA Exposure to other specified smoke, fire and flames, initial encounter: Secondary | ICD-10-CM | POA: Insufficient documentation

## 2012-08-05 DIAGNOSIS — Y9210 Unspecified residential institution as the place of occurrence of the external cause: Secondary | ICD-10-CM | POA: Insufficient documentation

## 2012-08-05 DIAGNOSIS — E785 Hyperlipidemia, unspecified: Secondary | ICD-10-CM | POA: Insufficient documentation

## 2012-08-05 DIAGNOSIS — E119 Type 2 diabetes mellitus without complications: Secondary | ICD-10-CM | POA: Insufficient documentation

## 2012-08-05 DIAGNOSIS — Y990 Civilian activity done for income or pay: Secondary | ICD-10-CM | POA: Insufficient documentation

## 2012-08-05 DIAGNOSIS — Z79899 Other long term (current) drug therapy: Secondary | ICD-10-CM | POA: Insufficient documentation

## 2012-08-05 DIAGNOSIS — T23109A Burn of first degree of unspecified hand, unspecified site, initial encounter: Secondary | ICD-10-CM | POA: Insufficient documentation

## 2012-08-05 DIAGNOSIS — Z87442 Personal history of urinary calculi: Secondary | ICD-10-CM | POA: Insufficient documentation

## 2012-08-05 DIAGNOSIS — Y9389 Activity, other specified: Secondary | ICD-10-CM | POA: Insufficient documentation

## 2012-08-05 MED ORDER — HYDROCODONE-ACETAMINOPHEN 5-325 MG PO TABS
1.0000 | ORAL_TABLET | Freq: Once | ORAL | Status: AC
  Administered 2012-08-05: 1 via ORAL
  Filled 2012-08-05: qty 1

## 2012-08-05 MED ORDER — HYDROCODONE-ACETAMINOPHEN 5-325 MG PO TABS: 2.0000 | ORAL_TABLET | ORAL | Status: DC | PRN

## 2012-08-05 MED ORDER — IBUPROFEN 800 MG PO TABS
800.0000 mg | ORAL_TABLET | Freq: Once | ORAL | Status: AC
  Administered 2012-08-05: 800 mg via ORAL
  Filled 2012-08-05: qty 1

## 2012-08-05 MED ORDER — IBUPROFEN 800 MG PO TABS: 800.0000 mg | ORAL_TABLET | Freq: Three times a day (TID) | ORAL | Status: DC
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
## 2012-08-09 LAB — CBC WITH DIFFERENTIAL/PLATELET
Basophils Relative: 1 % (ref 0–1)
Eosinophils Absolute: 0.4 10*3/uL (ref 0.0–0.7)
Eosinophils Relative: 6 % — ABNORMAL HIGH (ref 0–5)
HCT: 38.9 % — ABNORMAL LOW (ref 39.0–52.0)
Hemoglobin: 13.9 g/dL (ref 13.0–17.0)
MCH: 31 pg (ref 26.0–34.0)
MCHC: 35.7 g/dL (ref 30.0–36.0)
MCV: 86.6 fL (ref 78.0–100.0)
Monocytes Absolute: 0.7 10*3/uL (ref 0.1–1.0)
Monocytes Relative: 10 % (ref 3–12)

## 2012-08-09 LAB — BASIC METABOLIC PANEL
BUN: 14 mg/dL (ref 6–23)
Calcium: 9.6 mg/dL (ref 8.4–10.5)
Glucose, Bld: 197 mg/dL — ABNORMAL HIGH (ref 70–99)
Potassium: 4.5 mEq/L (ref 3.5–5.3)

## 2012-08-09 LAB — LIPID PANEL
HDL: 38 mg/dL — ABNORMAL LOW (ref >39)
LDL Cholesterol: 46 mg/dL (ref 0–99)

## 2012-08-09 LAB — HEPATIC FUNCTION PANEL
Albumin: 4.3 g/dL (ref 3.5–5.2)
Alkaline Phosphatase: 91 U/L (ref 39–117)
Total Bilirubin: 0.4 mg/dL (ref 0.3–1.2)

## 2012-08-09 LAB — HEMOGLOBIN A1C: Hgb A1c MFr Bld: 8.2 % — ABNORMAL HIGH (ref <5.7)

## 2012-08-09 NOTE — Telephone Encounter (Signed)
Lab orders placed & released/SLS

## 2012-08-14 DIAGNOSIS — D099 Carcinoma in situ, unspecified: Secondary | ICD-10-CM

## 2012-08-14 HISTORY — DX: Carcinoma in situ, unspecified: D09.9

## 2012-08-15 ENCOUNTER — Telehealth: Payer: Self-pay | Admitting: Internal Medicine

## 2012-08-15 ENCOUNTER — Encounter: Payer: Self-pay | Admitting: Internal Medicine

## 2012-08-15 ENCOUNTER — Ambulatory Visit (INDEPENDENT_AMBULATORY_CARE_PROVIDER_SITE_OTHER): Payer: 59 | Admitting: Internal Medicine

## 2012-08-15 VITALS — BP 122/78 | HR 84 | Temp 97.8°F | Resp 16 | Wt 188.5 lb

## 2012-08-15 DIAGNOSIS — J40 Bronchitis, not specified as acute or chronic: Secondary | ICD-10-CM

## 2012-08-15 DIAGNOSIS — E119 Type 2 diabetes mellitus without complications: Secondary | ICD-10-CM

## 2012-08-15 DIAGNOSIS — L989 Disorder of the skin and subcutaneous tissue, unspecified: Secondary | ICD-10-CM | POA: Insufficient documentation

## 2012-08-15 MED ORDER — AZITHROMYCIN 250 MG PO TABS: ORAL_TABLET | ORAL | Status: AC

## 2012-08-15 NOTE — Assessment & Plan Note (Signed)
Begin abx course. Followup if no improvement or worsening. 

## 2012-08-15 NOTE — Progress Notes (Signed)
  Subjective:    [name], male    DOB: 06/06/1956, 57 y.o.   MRN: [id number]  HPI Pt presents to clinic for followup of multiple medical problems. fsbs range variable 130-200+ without hypoglycemia. A1c improving. Notes nonhealing skin lesion on scalp for at least 2 months. C/o nasal/chest congestion and cough productive for dark sputum without hemoptysis, fever or chills for 1 month. Has persistent loose stools for ~2 months.  Past Medical History  Diagnosis Date  . Diabetes mellitus   . Hypertension   . History of chicken pox     childhood  . Seasonal allergies   . History of kidney stones   . Hyperlipidemia    Past Surgical History  Procedure Date  . Hernia repair   . Abdominal hernia repair   . Rotator cuff repair   . Cholecystectomy   . Mass excision 05/22/2012    Procedure: EXCISION MASS;  Surgeon: [name], MD;  Location: [facility];  Service: General;  Laterality: Left;  excision abdominal wall mass    reports that he has never smoked. He has never used smokeless tobacco. He reports that he does not drink alcohol or use illicit drugs. family history includes Cancer in his paternal grandmother; Diabetes in his maternal aunt; and Lung cancer in his mother.  There is no history of Breast cancer, and Colon cancer, and Prostate cancer, and Heart disease, . Allergies  Allergen Reactions  . Contrast Media (Iodinated Diagnostic Agents)   . Iodine Swelling      Review of Systems see hpi     Objective:   Physical Exam  Physical Exam  Nursing note and vitals reviewed. Constitutional: Appears well-developed and well-nourished. No distress.  HENT:  Head: Normocephalic and atraumatic.  Right Ear: External ear normal.  Left Ear: External ear normal.  Eyes: Conjunctivae are normal. No scleral icterus.  Neck: Neck supple. Carotid bruit is not present.  Cardiovascular: Normal rate, regular rhythm and normal heart sounds.  Exam reveals no  gallop and no friction rub.   No murmur heard. Pulmonary/Chest: Effort normal and breath sounds normal. No respiratory distress. He has no wheezes. no rales.  Lymphadenopathy:    He has no cervical adenopathy.  Neurological:Alert.  Skin: Skin is warm and dry. Not diaphoretic. small ~3-37mm slightly raised irritated skin lesion located frontal scalp Psychiatric: Has a normal mood and affect.        Assessment & Plan:

## 2012-08-15 NOTE — Telephone Encounter (Signed)
Please schedule labs prior to next visit  Chem7, a1c-250.00  Patient has upcoming appointment in April. He will be going to [facility] lab

## 2012-08-15 NOTE — Patient Instructions (Signed)
Please schedule labs prior to next visit Chem7, a1c-250.00 

## 2012-08-15 NOTE — Assessment & Plan Note (Signed)
Non healing for several months. Dermatology consult

## 2012-08-15 NOTE — Assessment & Plan Note (Signed)
Focus on diabetic diet to reduce fsbs variability. Call fsbs log to clinic after several weeks. Also may temporarily decrease metformin to qd for a few days to help determine if loose stools are related.

## 2012-09-09 ENCOUNTER — Telehealth: Payer: Self-pay

## 2012-09-09 MED ORDER — SITAGLIPTIN PHOSPHATE 100 MG PO TABS: 100.0000 mg | ORAL_TABLET | Freq: Every day | ORAL | Status: DC

## 2012-09-09 NOTE — Telephone Encounter (Signed)
Blood Sugar has been running between 165 and 175. Still is having problems with diarrhea.  Please advise?  Pt also needs refills of Januvia. Januvia sent to pharmacy

## 2012-09-09 NOTE — Telephone Encounter (Signed)
Pt was advised that he would probably not hear from [place] until tomorrow morning. Pt ok with this

## 2012-09-09 NOTE — Telephone Encounter (Signed)
We can switch him to Metformin XR which has less associated diarrhea. Start with the 1000mg  tabs, 1 qhs and if he tolerates it can titrate up to 2 tabs daily as needed and tolerated, disp #60, 1 rf. Also add a fiber supplement such as benefiber to help firm up stool If the XR version and fiber do not slow down his bowels we may have to change his sugar meds.

## 2012-09-10 MED ORDER — METFORMIN HCL ER 500 MG PO TB24: 1000.0000 mg | ORAL_TABLET | Freq: Every day | ORAL | Status: DC

## 2012-09-10 NOTE — Telephone Encounter (Signed)
Patient informed and voiced understanding.  Metformin XR sent to pharmacy

## 2012-11-01 ENCOUNTER — Other Ambulatory Visit: Payer: Self-pay | Admitting: Internal Medicine

## 2012-11-01 ENCOUNTER — Other Ambulatory Visit: Payer: Self-pay | Admitting: Family Medicine

## 2012-11-01 NOTE — Telephone Encounter (Signed)
Rx request to pharmacy/SLS  

## 2012-11-14 LAB — BASIC METABOLIC PANEL
Calcium: 9.4 mg/dL (ref 8.4–10.5)
Creat: 0.74 mg/dL (ref 0.50–1.35)
Sodium: 137 mEq/L (ref 135–145)

## 2012-11-14 LAB — HEMOGLOBIN A1C: Hgb A1c MFr Bld: 10.1 % — ABNORMAL HIGH (ref <5.7)

## 2012-11-14 NOTE — Telephone Encounter (Signed)
Pt presented to the lab, orders entered.

## 2012-11-15 ENCOUNTER — Ambulatory Visit: Payer: Self-pay | Admitting: Internal Medicine

## 2012-11-15 ENCOUNTER — Encounter: Payer: Self-pay | Admitting: Family

## 2012-11-15 ENCOUNTER — Ambulatory Visit (INDEPENDENT_AMBULATORY_CARE_PROVIDER_SITE_OTHER): Payer: 59 | Admitting: Family

## 2012-11-15 VITALS — BP 120/94 | HR 88 | Temp 98.0°F | Resp 16 | Ht 72.0 in | Wt 191.1 lb

## 2012-11-15 DIAGNOSIS — R51 Headache: Secondary | ICD-10-CM

## 2012-11-15 DIAGNOSIS — IMO0001 Reserved for inherently not codable concepts without codable children: Secondary | ICD-10-CM

## 2012-11-15 DIAGNOSIS — I4510 Unspecified right bundle-branch block: Secondary | ICD-10-CM

## 2012-11-15 DIAGNOSIS — L989 Disorder of the skin and subcutaneous tissue, unspecified: Secondary | ICD-10-CM

## 2012-11-15 DIAGNOSIS — I499 Cardiac arrhythmia, unspecified: Secondary | ICD-10-CM

## 2012-11-15 DIAGNOSIS — R519 Headache, unspecified: Secondary | ICD-10-CM | POA: Insufficient documentation

## 2012-11-15 MED ORDER — KETOROLAC TROMETHAMINE 30 MG/ML IJ SOLN
30.0000 mg | Freq: Once | INTRAMUSCULAR | Status: AC
  Administered 2012-11-15: 30 mg via INTRAMUSCULAR

## 2012-11-15 MED ORDER — SITAGLIPTIN PHOSPHATE 100 MG PO TABS: 100.0000 mg | ORAL_TABLET | Freq: Every day | ORAL | Status: DC

## 2012-11-15 MED ORDER — SUMATRIPTAN SUCCINATE 50 MG PO TABS: ORAL_TABLET | ORAL | Status: DC

## 2012-11-15 MED ORDER — LISINOPRIL 5 MG PO TABS: 5.0000 mg | ORAL_TABLET | Freq: Every day | ORAL | Status: DC

## 2012-11-15 NOTE — Patient Instructions (Addendum)
Restart januvia. Continue Glucophage xr. Call if headache worsens of if not improvement. Follow up as scheduled.

## 2012-11-15 NOTE — Assessment & Plan Note (Addendum)
Irregular HR noted on exam today. EKG performed and personally reviewed. Notes NSR with RBBB. RBBB is new when compared to 05/21/12 EKG.  Upon further questioning, pt denies hx of SOB.  Reports that he has had an occasional "twinge" in chest which he has though nothing of. Will refer for exercise stress test. Pt is agreeable. Add ASA 81 mg for cardiac protection.

## 2012-11-15 NOTE — Assessment & Plan Note (Addendum)
Plan IM toradol here today. Will Add low dose ACE for renal protection, BP control (DBP above goal), and headache prophylaxis. Add trial of prn imitrex.  BP Readings from Last 3 Encounters:  11/15/12 120/94  08/15/12 122/78  08/05/12 163/110

## 2012-11-15 NOTE — Assessment & Plan Note (Signed)
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 S/p excision by dermatology.

## 2012-11-15 NOTE — Assessment & Plan Note (Signed)
Continue metformin XR 500mg  bid and resume [place] 100mg . Pt instructed to work hard on diet, exercise.

## 2012-11-15 NOTE — Progress Notes (Signed)
Subjective:    [name], male    DOB: [birth year], 57 y.o.   MRN: [id number]  HPI   Headache- Started 3 days ago at the base of the head, now radiating around to the front.  Has been using ibuprofen with brief improvement but not resolution. He denies associated nausea,  He reports associated photophobia.  Mild phonophobia.  He denies hx of migraines.  Reports HA similar to this about 10 years ago.    Diabetes- He was struggling with diarrhea on regular metformin.  He was changed to XR.   A1C up to 10.1 from 8.2 3 months ago.  He is using benefiber. Reports that the diarrhea is resolved with this regimen.  He reports that he is taking 500mg  bid.  He was unable to tolerate 2000mg  without diarrhea.   Squamous Cell Carcinoma in Situ-  Pt had scalp lesion removed by dermatology and path revealed squamous cell carcinoma in situ.  Reports that after his biopsy came back positive, they then "scooped it completely out."   Review of Systems See HPI  Past Medical History  Diagnosis Date  . Diabetes mellitus   . Hypertension   . History of chicken pox     childhood  . Seasonal allergies   . History of kidney stones   . Hyperlipidemia     History   Social History  . Marital Status: Married    Spouse Name: N/A    Number of Children: N/A  . Years of Education: N/A   Occupational History  . Not on file.   Social History Main Topics  . Smoking status: Never Smoker   . Smokeless tobacco: Never Used  . Alcohol Use: No  . Drug Use: No  . Sexually Active: Not on file   Other Topics Concern  . Not on file   Social History Narrative  . No narrative on file    Past Surgical History  Procedure Laterality Date  . Hernia repair    . Abdominal hernia repair    . Rotator cuff repair    . Cholecystectomy    . Mass excision  05/22/2012    Procedure: EXCISION MASS;  Surgeon: [name], MD;  Location: [facility];  Service: General;  Laterality: Left;   excision abdominal wall mass    Family History  Problem Relation Age of Onset  . Lung cancer Mother   . Breast cancer Neg Hx   . Colon cancer Neg Hx   . Prostate cancer Neg Hx   . Heart disease Neg Hx   . Diabetes Maternal Aunt   . Cancer Paternal Grandmother     Allergies  Allergen Reactions  . Contrast Media (Iodinated Diagnostic Agents)   . Iodine Swelling    Current Outpatient Prescriptions on File Prior to Visit  Medication Sig Dispense Refill  . atorvastatin (LIPITOR) 20 MG tablet TAKE 1 TABLET BY MOUTH DAILY.  30 tablet  6  . cetirizine (ZYRTEC) 10 MG tablet Take 10 mg by mouth daily.      Marland Kitchen glucose blood (ONE TOUCH ULTRA TEST) test strip Use as instructed  100 each  2  . ibuprofen (ADVIL,MOTRIN) 200 MG tablet Take 400 mg by mouth as needed. Patient used this medication for his pain.      . Liniments (BLUE-EMU SUPER STRENGTH EX) Apply 1 application topically every morning. Patient used this medication on his leg for pain.      . metFORMIN (GLUCOPHAGE-XR) 500  MG 24 hr tablet TAKE 2 TABLETS (1,000 MG TOTAL) BY MOUTH DAILY WITH BREAKFAST. START WITH 2 TABS THEN INCREASE TO 4 AS TOLERATED  60 tablet  5  . MULTIPLE VITAMINS PO Take by mouth.      . NON FORMULARY [employer] PROSTATE & VERILITY.       No current facility-administered medications on file prior to visit.    BP 120/94  Pulse 88  Temp(Src) 98 F (36.7 C) (Oral)  Resp 16  Ht 6' (1.829 m)  Wt 191 lb 1.3 oz (86.673 kg)  BMI 25.91 kg/m2  SpO2 99%       Objective:   Physical Exam  Constitutional: He is oriented to person, place, and time. He appears well-developed and well-nourished. No distress.  HENT:  Head: Normocephalic.  Scalp scar noted top/anterior scalp- well healed.  Cardiovascular: Normal rate.  An irregular rhythm present.  Neurological: He is alert and oriented to person, place, and time.  Psychiatric: He has a normal mood and affect. His behavior is normal. Judgment and thought content normal.           Assessment & Plan:

## 2012-11-21 ENCOUNTER — Telehealth: Payer: Self-pay | Admitting: *Deleted

## 2012-11-21 MED ORDER — SUMATRIPTAN SUCCINATE 50 MG PO TABS: ORAL_TABLET | ORAL | Status: DC

## 2012-11-21 NOTE — Telephone Encounter (Signed)
Pay will pay OOP/7.42 for refill Imitrex [via phone to pharmacy & VO PCP]; will be seen 04.11.14 @ 8:45am for f/u headaches per provider/SLS

## 2012-11-22 ENCOUNTER — Ambulatory Visit (HOSPITAL_BASED_OUTPATIENT_CLINIC_OR_DEPARTMENT_OTHER)
Admission: RE | Admit: 2012-11-22 | Discharge: 2012-11-22 | Disposition: A | Discharge status: Home / Self Care | Payer: 59 | Source: Ambulatory Visit | Attending: Family | Admitting: Family

## 2012-11-22 ENCOUNTER — Telehealth: Payer: Self-pay | Admitting: Family

## 2012-11-22 ENCOUNTER — Encounter: Payer: Self-pay | Admitting: Family

## 2012-11-22 ENCOUNTER — Ambulatory Visit (INDEPENDENT_AMBULATORY_CARE_PROVIDER_SITE_OTHER): Payer: 59 | Admitting: Family

## 2012-11-22 VITALS — BP 124/86 | HR 89 | Temp 98.3°F | Resp 16 | Ht 72.0 in

## 2012-11-22 DIAGNOSIS — R51 Headache: Secondary | ICD-10-CM

## 2012-11-22 DIAGNOSIS — R030 Elevated blood-pressure reading, without diagnosis of hypertension: Secondary | ICD-10-CM

## 2012-11-22 DIAGNOSIS — I10 Essential (primary) hypertension: Secondary | ICD-10-CM | POA: Insufficient documentation

## 2012-11-22 LAB — CBC WITH DIFFERENTIAL/PLATELET
Eosinophils Relative: 5 % (ref 0–5)
Lymphocytes Relative: 40 % (ref 12–46)
Lymphs Abs: 2.2 10*3/uL (ref 0.7–4.0)
MCV: 86.9 fL (ref 78.0–100.0)
Neutro Abs: 2.4 10*3/uL (ref 1.7–7.7)
Neutrophils Relative %: 43 % (ref 43–77)
Platelets: 194 10*3/uL (ref 150–400)
RBC: 4.96 MIL/uL (ref 4.22–5.81)
WBC: 5.5 10*3/uL (ref 4.0–10.5)

## 2012-11-22 LAB — BASIC METABOLIC PANEL
CO2: 25 mEq/L (ref 19–32)
Chloride: 102 mEq/L (ref 96–112)
Potassium: 4.8 mEq/L (ref 3.5–5.3)
Sodium: 136 mEq/L (ref 135–145)

## 2012-11-22 MED ORDER — HYDROCODONE-ACETAMINOPHEN 5-325 MG PO TABS: 1.0000 | ORAL_TABLET | Freq: Four times a day (QID) | ORAL | Status: DC | PRN

## 2012-11-22 NOTE — Telephone Encounter (Signed)
Reviewed CT head- negative.  Left message for pt re: negative results.

## 2012-11-22 NOTE — Assessment & Plan Note (Signed)
Unchanged.  Will rx vicodin for short term.  Obtain CBC, ESR to further evaluate- need to exclude Temporal arteritis, though clinically my suspicicion is low. Will also refer to HA clinic and obtain CT head.

## 2012-11-22 NOTE — Progress Notes (Signed)
Subjective:    [name], male    DOB: 1955/10/09, 57 y.o.   MRN: [id number]  HPI  Albert [name] is a 57 yr old male who presents today with chief complaint of headache.  He was seen on 11/15/12 with complaint of same.    Last visit he was givn IM toradol, and ACE inhibitor was added to his regimen and he was given a trial of imitrex. Notes that he had some improvement with toradol and imitrex but only briefly.    Today he reports headache to be throbbing and is located "all over."  HA is constant and is not associated with nausea. He does report associated blurred vision.  He reports + photophobia- has been wearing sunglasses.  He has been sleeping "ok."  Percocet helped "some" but still woke up last night. Reports pain 8/10 currently. He does not have a previous history of migraines.    Review of Systems    see HPI  Past Medical History  Diagnosis Date  . Diabetes mellitus   . Hypertension   . History of chicken pox     childhood  . Seasonal allergies   . History of kidney stones   . Hyperlipidemia   . 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 Marital Status: Married    Spouse Name: N/A    Number of Children: N/A  . Years of Education: N/A   Occupational History  . Not on file.   Social History Main Topics  . Smoking status: Never Smoker   . Smokeless tobacco: Never Used  . Alcohol Use: No  . Drug Use: No  . Sexually Active: Not on file   Other Topics Concern  . Not on file   Social History Narrative  . No narrative on file    Past Surgical History  Procedure Laterality Date  . Hernia repair    . Abdominal hernia repair    . Rotator cuff repair    . Cholecystectomy    . Mass excision  05/22/2012    Procedure: EXCISION MASS;  Surgeon: [name], MD;  Location: [facility];  Service: General;  Laterality: Left;  excision abdominal wall mass    Family History   Problem Relation Age of Onset  . Lung cancer Mother   . Breast cancer Neg Hx   . Colon cancer Neg Hx   . Prostate cancer Neg Hx   . Heart disease Neg Hx   . Diabetes Maternal Aunt   . Cancer Paternal Grandmother     Allergies  Allergen Reactions  . Contrast Media (Iodinated Diagnostic Agents)   . Iodine Swelling    Current Outpatient Prescriptions on File Prior to Visit  Medication Sig Dispense Refill  . aspirin EC 81 MG tablet Take 81 mg by mouth daily.      Marland Kitchen atorvastatin (LIPITOR) 20 MG tablet TAKE 1 TABLET BY MOUTH DAILY.  30 tablet  6  . cetirizine (ZYRTEC) 10 MG tablet Take 10 mg by mouth daily.      Marland Kitchen glucose blood (ONE TOUCH ULTRA TEST) test strip Use as instructed  100 each  2  . ibuprofen (ADVIL,MOTRIN) 200 MG tablet Take 400 mg by mouth as needed. Patient used this medication for his pain.      . Liniments (BLUE-EMU SUPER STRENGTH EX) Apply 1 application topically every morning. Patient used this  medication on his leg for pain.      Marland Kitchen lisinopril (PRINIVIL,ZESTRIL) 5 MG tablet Take 1 tablet (5 mg total) by mouth daily.  30 tablet  2  . metFORMIN (GLUCOPHAGE-XR) 500 MG 24 hr tablet TAKE 2 TABLETS (1,000 MG TOTAL) BY MOUTH DAILY WITH BREAKFAST. START WITH 2 TABS THEN INCREASE TO 4 AS TOLERATED  60 tablet  5  . MULTIPLE VITAMINS PO Take by mouth.      . NON FORMULARY GNC PROSTATE & VERILITY.      . sitaGLIPtin (JANUVIA) 100 MG tablet Take 1 tablet (100 mg total) by mouth daily.  30 tablet  1  . SUMAtriptan (IMITREX) 50 MG tablet One tablet at start of headache. May repeat in 2 hours if headache not resolved.  Max 2 tabs/day  10 tablet  0   No current facility-administered medications on file prior to visit.    BP 124/86  Pulse 89  Temp(Src) 98.3 F (36.8 C) (Oral)  Resp 16  Ht 6' (1.829 m)  SpO2 99%    Objective:   Physical Exam  Constitutional: He is oriented to person, place, and time. He appears well-developed and well-nourished. No distress.  HENT:  Right  Ear: Tympanic membrane and ear canal normal.  Left Ear: Tympanic membrane and ear canal normal.  Cardiovascular: Normal rate and regular rhythm.   No murmur heard. Pulmonary/Chest: Effort normal and breath sounds normal. No respiratory distress. He has no wheezes. He has no rales. He exhibits no tenderness.  Neurological: He is alert and oriented to person, place, and time.  EOM intact, + facial symmetry, bilateral UE/LE strength 5/5, tongue midline.   Pupils are fairly constricted bilaterally, but are reactive to light.  Psychiatric: He has a normal mood and affect. His behavior is normal. Judgment and thought content normal.          Assessment & Plan:

## 2012-11-22 NOTE — Patient Instructions (Addendum)
Please complete your blood work prior to leaving.  Do not drive after taking vicodin (pain medication) Please complete your CT head on the first floor today. You will be contacted about your referral to headache clinic.  Please let us know if you have not heard back within 1 week about your referral.

## 2012-11-22 NOTE — Assessment & Plan Note (Signed)
BP Readings from Last 3 Encounters:  11/22/12 124/86  11/15/12 120/94  08/15/12 122/78  DBP better on ace, continue same, obtain bmet.

## 2012-11-23 ENCOUNTER — Encounter: Payer: Self-pay | Admitting: Family

## 2012-12-04 ENCOUNTER — Emergency Department (HOSPITAL_BASED_OUTPATIENT_CLINIC_OR_DEPARTMENT_OTHER)
Admission: EM | Admit: 2012-12-04 | Discharge: 2012-12-04 | Disposition: A | Discharge status: Home / Self Care | Payer: 59 | Attending: Emergency Medicine | Admitting: Emergency Medicine

## 2012-12-04 ENCOUNTER — Encounter (HOSPITAL_BASED_OUTPATIENT_CLINIC_OR_DEPARTMENT_OTHER): Payer: Self-pay | Admitting: *Deleted

## 2012-12-04 DIAGNOSIS — M5430 Sciatica, unspecified side: Secondary | ICD-10-CM | POA: Insufficient documentation

## 2012-12-04 DIAGNOSIS — I10 Essential (primary) hypertension: Secondary | ICD-10-CM | POA: Insufficient documentation

## 2012-12-04 DIAGNOSIS — R51 Headache: Secondary | ICD-10-CM | POA: Insufficient documentation

## 2012-12-04 DIAGNOSIS — E785 Hyperlipidemia, unspecified: Secondary | ICD-10-CM | POA: Insufficient documentation

## 2012-12-04 DIAGNOSIS — Z7982 Long term (current) use of aspirin: Secondary | ICD-10-CM | POA: Insufficient documentation

## 2012-12-04 DIAGNOSIS — Z79899 Other long term (current) drug therapy: Secondary | ICD-10-CM | POA: Insufficient documentation

## 2012-12-04 DIAGNOSIS — Z8619 Personal history of other infectious and parasitic diseases: Secondary | ICD-10-CM | POA: Insufficient documentation

## 2012-12-04 DIAGNOSIS — E119 Type 2 diabetes mellitus without complications: Secondary | ICD-10-CM | POA: Insufficient documentation

## 2012-12-04 DIAGNOSIS — Z87442 Personal history of urinary calculi: Secondary | ICD-10-CM | POA: Insufficient documentation

## 2012-12-04 DIAGNOSIS — M5431 Sciatica, right side: Secondary | ICD-10-CM

## 2012-12-04 MED ORDER — OXYCODONE-ACETAMINOPHEN 5-325 MG PO TABS: 1.0000 | ORAL_TABLET | ORAL | Status: DC | PRN

## 2012-12-04 MED ORDER — CYCLOBENZAPRINE HCL 10 MG PO TABS: 10.0000 mg | ORAL_TABLET | Freq: Three times a day (TID) | ORAL | Status: DC | PRN

## 2012-12-04 NOTE — ED Notes (Signed)
Pt c/o lower back pain which radiates down right leg x 6 months w/o injury

## 2012-12-04 NOTE — ED Notes (Signed)
Pt states pain is to right low back and "shoots down my leg like a shock".

## 2012-12-04 NOTE — ED Provider Notes (Signed)
History     CSN: [id number]  Arrival date & time 12/04/12  1529   First MD Initiated Contact with Patient 12/04/12 1737      Chief Complaint  Patient presents with  . Back Pain    (Consider location/radiation/quality/duration/timing/severity/associated sxs/prior treatment) Patient is a 57 y.o. male presenting with back pain. The history is provided by the patient and medical records. No language interpreter was used.  Back Pain Location:  Lumbar spine Quality:  Shooting Radiates to:  R foot Pain severity:  Severe Onset quality:  Gradual (He had fair pain on and off for 6 months. 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 Relieved by:  Nothing Worsened by:  Nothing tried Ineffective treatments:  None tried Associated symptoms: headaches     Past Medical History  Diagnosis Date  . Diabetes mellitus   . Hypertension   . History of chicken pox     childhood  . Seasonal allergies   . History of kidney stones   . Hyperlipidemia   . 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 Hernia repair    . Abdominal hernia repair    . Rotator cuff repair    . Cholecystectomy    . Mass excision  05/22/2012    Procedure: EXCISION MASS;  Surgeon: [name], MD;  Location: [facility];  Service: General;  Laterality: Left;  excision abdominal wall mass    Family History  Problem Relation Age of Onset  . Lung cancer Mother   . Breast cancer Neg Hx   . Colon cancer Neg Hx   . Prostate cancer Neg Hx   . Heart disease Neg Hx   . Diabetes Maternal Aunt   . Cancer Paternal Grandmother     History  Substance Use Topics  . Smoking status: Never Smoker   . Smokeless tobacco: Never  Used  . Alcohol Use: No      Review of Systems  Constitutional: Negative.   Eyes: Negative.   Respiratory: Negative.   Cardiovascular: Negative.   Gastrointestinal: Negative.   Genitourinary: Negative.   Musculoskeletal: Positive for back pain.  Skin: Negative.   Neurological: Positive for headaches.       Recent treatment for migraine headache.  Psychiatric/Behavioral: Negative.     Allergies  Contrast media and Iodine  Home Medications   Current Outpatient Rx  Name  Route  Sig  Dispense  Refill  . aspirin EC 81 MG tablet   Oral   Take 81 mg by mouth daily.         Marland Kitchen atorvastatin (LIPITOR) 20 MG tablet      TAKE 1 TABLET BY MOUTH DAILY.   30 tablet   6     PATIENT IS REQUESTING ADDITONAL REFILLS PLEASE. TH ...   . cetirizine (ZYRTEC) 10 MG tablet   Oral   Take 10 mg by mouth daily.         Marland Kitchen glucose blood (ONE TOUCH ULTRA TEST) test strip      Use as instructed   100 each   2     Use to test blood sugar three times daily. Dx 790. ...   . HYDROcodone-acetaminophen (  NORCO/VICODIN) 5-325 MG per tablet   Oral   Take 1 tablet by mouth every 6 (six) hours as needed for pain.   20 tablet   0   . ibuprofen (ADVIL,MOTRIN) 200 MG tablet   Oral   Take 400 mg by mouth as needed. Patient used this medication for his pain.         . Liniments (BLUE-EMU SUPER STRENGTH EX)   Apply externally   Apply 1 application topically every morning. Patient used this medication on his leg for pain.         Marland Kitchen lisinopril (PRINIVIL,ZESTRIL) 5 MG tablet   Oral   Take 1 tablet (5 mg total) by mouth daily.   30 tablet   2   . metFORMIN (GLUCOPHAGE-XR) 500 MG 24 hr tablet      TAKE 2 TABLETS (1,000 MG TOTAL) BY MOUTH DAILY WITH BREAKFAST. START WITH 2 TABS THEN INCREASE TO 4 AS TOLERATED   60 tablet   5     PATIENT IS REQUESTING ADDITIONAL REFILLS PLEASE. T ...   . MULTIPLE VITAMINS PO   Oral   Take by mouth.         . NON FORMULARY      GNC PROSTATE &  VERILITY.         . sitaGLIPtin (JANUVIA) 100 MG tablet   Oral   Take 1 tablet (100 mg total) by mouth daily.   30 tablet   1   . SUMAtriptan (IMITREX) 50 MG tablet      One tablet at start of headache. May repeat in 2 hours if headache not resolved.  Max 2 tabs/day   10 tablet   0     Patient will pay OOP/7.42     BP 133/89  Pulse 96  Temp(Src) 97.4 F (36.3 C) (Oral)  Resp 18  Ht 6' (1.829 m)  Wt 189 lb (85.73 kg)  BMI 25.63 kg/m2  SpO2 100%  Physical Exam  Nursing note and vitals reviewed. Constitutional: He is oriented to person, place, and time. He appears well-developed and well-nourished. Distressed: in moderate distress with pain in the right lumbar region radiating into the right leg.  HENT:  Head: Normocephalic and atraumatic.  Right Ear: External ear normal.  Left Ear: External ear normal.  Mouth/Throat: Oropharynx is clear and moist.  Eyes: Conjunctivae and EOM are normal. Pupils are equal, round, and reactive to light.  Neck: Normal range of motion. Neck supple.  Cardiovascular: Normal rate, regular rhythm and normal heart sounds.   Pulmonary/Chest: Effort normal and breath sounds normal.  Abdominal: Soft. Bowel sounds are normal.  Musculoskeletal: Normal range of motion.  He localizes pain to the right lumbar region, with a rate and lateral aspect of his right foot. Is no palpable bony deformity of his lumbar spine or leg.  Neurological: He is alert and oriented to person, place, and time.  No sensory or motor deficit.  Skin: Skin is warm and dry.  Psychiatric: He has a normal mood and affect. His behavior is normal.    ED Course  Procedures (including critical care time)  Rx for sciatica with Percocet for pain, Flexeril for muscle spasm.  No work for 3 days.  Advised he will need to have followup with Dr. Gerlene Fee, neurosurgeon to whom he has been previously referred, if he does not improve with medication.    1. Sciatica neuralgia, right          Carleene Cooper III, MD 12/04/12  1817 

## 2012-12-05 ENCOUNTER — Encounter: Payer: Self-pay | Admitting: Physician Assistant

## 2012-12-05 ENCOUNTER — Ambulatory Visit: Payer: Self-pay | Admitting: Family Medicine

## 2012-12-06 ENCOUNTER — Telehealth: Payer: Self-pay | Admitting: Family

## 2012-12-06 NOTE — Telephone Encounter (Signed)
Albert [name] from [facility]  Called   - pt is in hospital -, do you want the exercise tolerance test cancel or re-schedule

## 2012-12-06 NOTE — Telephone Encounter (Signed)
Please reschedule

## 2012-12-11 ENCOUNTER — Encounter: Payer: Self-pay | Admitting: Neurology

## 2012-12-11 ENCOUNTER — Ambulatory Visit (INDEPENDENT_AMBULATORY_CARE_PROVIDER_SITE_OTHER): Payer: 59 | Admitting: Neurology

## 2012-12-11 VITALS — BP 100/64 | HR 84 | Temp 97.8°F | Resp 12 | Ht 72.0 in | Wt 192.0 lb

## 2012-12-11 DIAGNOSIS — G43119 Migraine with aura, intractable, without status migrainosus: Secondary | ICD-10-CM

## 2012-12-11 DIAGNOSIS — M5430 Sciatica, unspecified side: Secondary | ICD-10-CM

## 2012-12-11 DIAGNOSIS — R519 Headache, unspecified: Secondary | ICD-10-CM

## 2012-12-11 DIAGNOSIS — G43819 Other migraine, intractable, without status migrainosus: Secondary | ICD-10-CM

## 2012-12-11 DIAGNOSIS — R51 Headache: Secondary | ICD-10-CM

## 2012-12-11 DIAGNOSIS — M5431 Sciatica, right side: Secondary | ICD-10-CM

## 2012-12-11 MED ORDER — DICLOFENAC POTASSIUM(MIGRAINE) 50 MG PO PACK: 50.0000 mg | PACK | ORAL | Status: DC

## 2012-12-11 MED ORDER — METHYLPREDNISOLONE (PAK) 4 MG PO TABS: ORAL_TABLET | ORAL | Status: DC

## 2012-12-11 MED ORDER — KETOROLAC TROMETHAMINE 60 MG/2ML IM SOLN
60.0000 mg | Freq: Once | INTRAMUSCULAR | Status: AC
  Administered 2012-12-11: 60 mg via INTRAMUSCULAR

## 2012-12-11 NOTE — Progress Notes (Signed)
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  He also had an episode of sciatica down the right leg 2 years ago that responded to physical therapy and exercises.  In the past, he hasn't had an occasional sinus headache when he had a sinus infection around the eyes.  He has also had occasional throbbing temple headaches when he doesn't have enough rest and they usually respond to a couple of Advil.  He is working 312 hour shifts at night and sometimes a four-hour shift at night for the past 2 years and sometimes he does not get full amount of sleep on this schedule.  About one month ago, he developed a headache in the occipital area of the head and felt like a regular headache. He took 2 Advil but it did not go away. The headache spread across the middle of the head to the frontal area and became throbbing and more severe.  He may have had a slight amount of photophobia but not obvious amount of photophobia or nausea.  The headache persisted and he got a shot of Toradol and Imitrex in the ER and headaches seem to go away.  However the headache came back in the same pattern. He has a dull headache all the time and 5 8/10 and then the headache increases, he feels a pressure at the back of the neck and the headache increases to a 9 or 10.  He took the prescription of Imitrex up to 50 mg twice a day and that would help more than the Advil but again, that headache didn't go away completely and still has not.  It can wake him up at night. It tends to be there when he goes to bed and when he wakes up.  The last one week, the sciatica problem has returned with pain in the right buttock area and shooting pain extending down the side leg and sometimes some numbness or tingling on top of the foot. He has had steroids in the past for different conditions and he would be willing to try a steroid pack for the sciatica as well as the headache.  He has not had a head imaging  study.  Review of systems is positive for waking up once at night to go to the bathroom, working the night shift and sleeping during the day some days and sleeping at night other days, occasional fatigue and occasional digestive trouble. Remainder of review of systems is negative.  Past Medical History  Diagnosis Date  . Diabetes mellitus   . Hypertension   . History of chicken pox     childhood  . Seasonal allergies   . History of kidney stones   . Hyperlipidemia   . 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 aspirin EC 81 MG tablet Take 81 mg by mouth daily.      Marland Kitchen atorvastatin (LIPITOR) 20 MG tablet TAKE 1 TABLET BY MOUTH DAILY.  30 tablet  6  . cetirizine (ZYRTEC) 10 MG tablet Take 10 mg by mouth daily.      . cyclobenzaprine (FLEXERIL) 10 MG tablet Take 1 tablet (10 mg total) by mouth 3 (three) times daily as needed for muscle spasms.  15 tablet  0  . glucose blood (ONE TOUCH ULTRA TEST) test strip Use as  instructed  100 each  2  . ibuprofen (ADVIL,MOTRIN) 200 MG tablet Take 400 mg by mouth as needed. Patient used this medication for his pain.      . Liniments (BLUE-EMU SUPER STRENGTH EX) Apply 1 application topically every morning. Patient used this medication on his leg for pain.      Marland Kitchen lisinopril (PRINIVIL,ZESTRIL) 5 MG tablet Take 1 tablet (5 mg total) by mouth daily.  30 tablet  2  . metFORMIN (GLUCOPHAGE-XR) 500 MG 24 hr tablet TAKE 2 TABLETS (1,000 MG TOTAL) BY MOUTH DAILY WITH BREAKFAST. START WITH 2 TABS THEN INCREASE TO 4 AS TOLERATED  60 tablet  5  . MULTIPLE VITAMINS PO Take by mouth.      . NON FORMULARY GNC PROSTATE & VERILITY.      Marland Kitchen oxyCODONE-acetaminophen (PERCOCET/ROXICET) 5-325 MG per tablet Take 1 tablet by mouth every 4 (four) hours as needed for pain.  20 tablet  0  . sitaGLIPtin (JANUVIA) 100 MG tablet Take 1 tablet (100 mg total) by mouth  daily.  30 tablet  1  . SUMAtriptan (IMITREX) 50 MG tablet One tablet at start of headache. May repeat in 2 hours if headache not resolved.  Max 2 tabs/day  10 tablet  0  . HYDROcodone-acetaminophen (NORCO/VICODIN) 5-325 MG per tablet Take 1 tablet by mouth every 6 (six) hours as needed for pain.  20 tablet  0   No current facility-administered medications on file prior to visit.   Contrast media and Iodine allergies History   Social History  . Marital Status: Married    Spouse Name: N/A    Number of Children: N/A  . Years of Education: N/A   Occupational History  . Not on file.   Social History Main Topics  . Smoking status: Never Smoker   . Smokeless tobacco: Never Used  . Alcohol Use: No  . Drug Use: No  . Sexually Active: Not on file   Other Topics Concern  . Not on file   Social History Narrative  . No narrative on file    Family History  Problem Relation Age of Onset  . Lung cancer Mother   . Breast cancer Neg Hx   . Colon cancer Neg Hx   . Prostate cancer Neg Hx   . Heart disease Neg Hx   . Diabetes Maternal Aunt   . Cancer Paternal Grandmother     BP 100/64  Pulse 84  Temp(Src) 97.8 F (36.6 C)  Resp 12  Ht 6' (1.829 m)  Wt 192 lb (87.091 kg)  BMI 26.03 kg/m2   Alert and oriented x 3.  He complains of a 5/10 headache that is failry diffuse.  Memory function appears to be intact.  Concentration and attention are normal for educational level and background.  Speech is fluent and without significant word finding difficulty.  Is aware of current events.  No carotid bruits detected.  Cranial nerve II through XII are within normal limits.  This includes normal optic discs and acuity, EOMI, PERLA, facial movement and sensation intact, hearing grossly intact, gag intact,Uvula raises symmetrically and tongue protrudes evenly. Motor strength is 5 over 5 throughout all limbs.  No atrophy, abnormal tone or tremors. Reflexes are 2+ and symmetric in the upper and  lower extremities  1+ at left ankle and tr or absent at left ankle. Sensory exam is intact. Coordination is intact for fine movements and rapid alternating movements in all limbs Gait and station are normal.  Impression: 1. Atypical status migrainosus headache for one month in this patient had an occasional throbbing temporal headache in the past.  No history of head imaging present and he now has a one-month headache which had partial relief with Advil, Toradol and Imitrex. 2. Right-sided sciatica consistent with possible L5 radiculopathy with radiation to the top of the right foot.  The right ankle reflex is diminished but this could be due to his diabetes.  Plan: 1. Toradol 60 and now x1 to see if this will help break the headache cycle. 2. MRI with and without in this 57 year old with a one-month headache that is atypical for him 3. 16 Medrol Dosepak which could be beneficial for the sciatica and can also sometimes beneficial for a chronic headache pattern. 4. Candia samples of headache should return. 5. Return in 2 weeks.

## 2012-12-11 NOTE — Patient Instructions (Addendum)
Your MRI is scheduled at [facility] on Friday, May 2 at 5:00 pm. Please check in at the first floor radiology department 15 minutes prior to your scheduled appointment time.    [phone].

## 2012-12-11 NOTE — Addendum Note (Signed)
Addended by: Benay Spice on: 12/11/2012 09:57 AM   Modules accepted: Orders

## 2012-12-13 ENCOUNTER — Other Ambulatory Visit (HOSPITAL_COMMUNITY): Payer: Self-pay

## 2012-12-13 ENCOUNTER — Ambulatory Visit (HOSPITAL_COMMUNITY)
Admission: RE | Admit: 2012-12-13 | Discharge: 2012-12-13 | Disposition: A | Discharge status: Home / Self Care | Payer: 59 | Source: Ambulatory Visit | Attending: Neurology | Admitting: Neurology

## 2012-12-13 ENCOUNTER — Ambulatory Visit: Payer: Self-pay | Admitting: Family Medicine

## 2012-12-13 DIAGNOSIS — I10 Essential (primary) hypertension: Secondary | ICD-10-CM | POA: Insufficient documentation

## 2012-12-13 DIAGNOSIS — E785 Hyperlipidemia, unspecified: Secondary | ICD-10-CM | POA: Insufficient documentation

## 2012-12-13 DIAGNOSIS — G43819 Other migraine, intractable, without status migrainosus: Secondary | ICD-10-CM

## 2012-12-13 DIAGNOSIS — G43119 Migraine with aura, intractable, without status migrainosus: Secondary | ICD-10-CM

## 2012-12-13 DIAGNOSIS — R51 Headache: Secondary | ICD-10-CM | POA: Insufficient documentation

## 2012-12-13 DIAGNOSIS — H53149 Visual discomfort, unspecified: Secondary | ICD-10-CM | POA: Insufficient documentation

## 2012-12-13 DIAGNOSIS — R42 Dizziness and giddiness: Secondary | ICD-10-CM | POA: Insufficient documentation

## 2012-12-13 DIAGNOSIS — E119 Type 2 diabetes mellitus without complications: Secondary | ICD-10-CM | POA: Insufficient documentation

## 2012-12-13 MED ORDER — GADOBENATE DIMEGLUMINE 529 MG/ML IV SOLN
19.0000 mL | Freq: Once | INTRAVENOUS | Status: AC | PRN
  Administered 2012-12-13: 19 mL via INTRAVENOUS

## 2012-12-19 ENCOUNTER — Encounter: Payer: Self-pay | Admitting: Physician Assistant

## 2012-12-20 ENCOUNTER — Encounter: Payer: Self-pay | Admitting: Family Medicine

## 2012-12-20 ENCOUNTER — Ambulatory Visit (INDEPENDENT_AMBULATORY_CARE_PROVIDER_SITE_OTHER): Payer: 59 | Admitting: Family Medicine

## 2012-12-20 VITALS — BP 118/74 | HR 94 | Temp 98.0°F | Ht 72.0 in | Wt 187.0 lb

## 2012-12-20 DIAGNOSIS — M5430 Sciatica, unspecified side: Secondary | ICD-10-CM

## 2012-12-20 DIAGNOSIS — E785 Hyperlipidemia, unspecified: Secondary | ICD-10-CM

## 2012-12-20 DIAGNOSIS — IMO0001 Reserved for inherently not codable concepts without codable children: Secondary | ICD-10-CM

## 2012-12-20 DIAGNOSIS — M5431 Sciatica, right side: Secondary | ICD-10-CM

## 2012-12-20 DIAGNOSIS — N529 Male erectile dysfunction, unspecified: Secondary | ICD-10-CM

## 2012-12-20 MED ORDER — NAPROXEN 375 MG PO TABS: 375.0000 mg | ORAL_TABLET | Freq: Two times a day (BID) | ORAL | Status: DC

## 2012-12-20 MED ORDER — METHOCARBAMOL 500 MG PO TABS: 500.0000 mg | ORAL_TABLET | Freq: Three times a day (TID) | ORAL | Status: DC | PRN

## 2012-12-20 MED ORDER — SILDENAFIL CITRATE 100 MG PO TABS: 50.0000 mg | ORAL_TABLET | Freq: Every day | ORAL | Status: DC | PRN

## 2012-12-20 NOTE — Patient Instructions (Addendum)
Consider a krill oil cap such as MegaRed caps  Labs prior lipid, renal, cbc, hgba1c, hepatic, tsh  Diabetes and Exercise Regular exercise is important and can help:   Control blood glucose (sugar).  Decrease blood pressure.    Control blood lipids (cholesterol, triglycerides).  Improve overall health. BENEFITS FROM EXERCISE  Improved fitness.  Improved flexibility.  Improved endurance.  Increased bone density.  Weight control.  Increased muscle strength.  Decreased body fat.  Improvement of the body's use of insulin, a hormone.  Increased insulin sensitivity.  Reduction of insulin needs.  Reduced stress and tension.  Helps you feel better. People with diabetes who add exercise to their lifestyle gain additional benefits, including:  Weight loss.  Reduced appetite.  Improvement of the body's use of blood glucose.  Decreased risk factors for heart disease:  Lowering of cholesterol and triglycerides.  Raising the level of good cholesterol (high-density lipoproteins, HDL).  Lowering blood sugar.  Decreased blood pressure. TYPE 1 DIABETES AND EXERCISE  Exercise will usually lower your blood glucose.  If blood glucose is greater than 240 mg/dl, check urine ketones. If ketones are present, do not exercise.  Location of the insulin injection sites may need to be adjusted with exercise. Avoid injecting insulin into areas of the body that will be exercised. For example, avoid injecting insulin into:  The arms when playing tennis.  The legs when jogging. For more information, discuss this with your caregiver.  Keep a record of:  Food intake.  Type and amount of exercise.  Expected peak times of insulin action.  Blood glucose levels. Do this before, during, and after exercise. Review your records with your caregiver. This will help you to develop guidelines for adjusting food intake and insulin amounts.  TYPE 2 DIABETES AND EXERCISE  Regular  physical activity can help control blood glucose.  Exercise is important because it may:  Increase the body's sensitivity to insulin.  Improve blood glucose control.  Exercise reduces the risk of heart disease. It decreases serum cholesterol and triglycerides. It also lowers blood pressure.  Those who take insulin or oral hypoglycemic agents should watch for signs of hypoglycemia. These signs include dizziness, shaking, sweating, chills, and confusion.  Body water is lost during exercise. It must be replaced. This will help to avoid loss of body fluids (dehydration) or heat stroke. Be sure to talk to your caregiver before starting an exercise program to make sure it is safe for you. Remember, any activity is better than none.  Document Released: 10/21/2003 Document Revised: 10/23/2011 Document Reviewed: 02/04/2009 [facility] Patient Information 2013 [place], [place].

## 2012-12-22 ENCOUNTER — Encounter: Payer: Self-pay | Admitting: Family Medicine

## 2012-12-22 DIAGNOSIS — M5431 Sciatica, right side: Secondary | ICD-10-CM

## 2012-12-22 HISTORY — DX: Sciatica, right side: M54.31

## 2012-12-22 NOTE — Assessment & Plan Note (Signed)
Tolerating Atorvastatin, avoid trans fats, continue krill oil caps.

## 2012-12-22 NOTE — Assessment & Plan Note (Signed)
Given rx for Robaxin and Naproxen. Encouraged moist heat and gentle stretching

## 2012-12-22 NOTE — Progress Notes (Signed)
[name], male   DOB: 02/02/1956, 57 y.o.   MRN: [id number] MIKHAIL HALLENBECK [id number] [birth year] 12/22/2012      Progress Note-Follow Up  Subjective  Chief Complaint  Chief Complaint  Patient presents with  . Follow-up    HPI  Patient is a 57 year old male in today for followup. Blood sugars have generally been well-controlled recently  itching from short-acting to long-acting metformin. He's been able to tolerate this better with less GI upset and this has been taking his doses more routinely. Is to use tissue has been right hip pain and right sided sciatica. He presented to the ED on 423 with significant pain. Flexeril each bedtime has been partially helpful but his pain is persistent. Also given a short course of steroids. Denies any falls or trauma. No incontinence or back pain. No chest pain, palpitations, shortness of breath, GI or GU concerns.   Past Medical History  Diagnosis Date  . Diabetes mellitus   . Hypertension   . History of chicken pox     childhood  . Seasonal allergies   . History of kidney stones   . Hyperlipidemia   . 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 Sciatica of right side 12/22/2012    Past Surgical History  Procedure Laterality Date  . Hernia repair    . Abdominal hernia repair    . Rotator cuff repair    . Cholecystectomy    . Mass excision  05/22/2012    Procedure: EXCISION MASS;  Surgeon: [name], MD;  Location: [facility];  Service: General;  Laterality: Left;  excision abdominal wall mass    Family History  Problem Relation Age of Onset  . Lung cancer Mother   . Breast cancer Neg Hx   . Colon cancer Neg Hx   . Prostate cancer Neg Hx   . Heart disease Neg Hx   . Diabetes Maternal Aunt   . Cancer Paternal Grandmother     History   Social History  . Marital Status: Married    Spouse Name: N/A    Number of Children: N/A  . Years of Education: N/A    Occupational History  . Not on file.   Social History Main Topics  . Smoking status: Never Smoker   . Smokeless tobacco: Never Used  . Alcohol Use: No  . Drug Use: No  . Sexually Active: Not on file   Other Topics Concern  . Not on file   Social History Narrative  . No narrative on file    Current Outpatient Prescriptions on File Prior to Visit  Medication Sig Dispense Refill  . aspirin EC 81 MG tablet Take 81 mg by mouth daily.      Marland Kitchen atorvastatin (LIPITOR) 20 MG tablet TAKE 1 TABLET BY MOUTH DAILY.  30 tablet  6  . cetirizine (ZYRTEC) 10 MG tablet Take 10 mg by mouth daily.      Marland Kitchen glucose blood (ONE TOUCH ULTRA TEST) test strip Use as instructed  100 each  2  . ibuprofen (ADVIL,MOTRIN) 200 MG tablet Take 400 mg by mouth as needed. Patient used this medication for his pain.      . Liniments (BLUE-EMU SUPER STRENGTH EX) Apply 1 application topically every morning. Patient used this medication on his leg for pain.      Marland Kitchen lisinopril (PRINIVIL,ZESTRIL) 5 MG tablet Take 1 tablet (5 mg total) by  mouth daily.  30 tablet  2  . metFORMIN (GLUCOPHAGE-XR) 500 MG 24 hr tablet TAKE 2 TABLETS (1,000 MG TOTAL) BY MOUTH DAILY WITH BREAKFAST. START WITH 2 TABS THEN INCREASE TO 4 AS TOLERATED  60 tablet  5  . NON FORMULARY GNC PROSTATE & VERILITY.      . sitaGLIPtin (JANUVIA) 100 MG tablet Take 1 tablet (100 mg total) by mouth daily.  30 tablet  1   No current facility-administered medications on file prior to visit.    Allergies  Allergen Reactions  . Contrast Media (Iodinated Diagnostic Agents)   . Iodine Swelling    Review of Systems  Review of Systems  Constitutional: Negative for fever and malaise/fatigue.  HENT: Negative for congestion.   Eyes: Negative for pain and discharge.  Respiratory: Negative for shortness of breath.   Cardiovascular: Negative for chest pain, palpitations and leg swelling.  Gastrointestinal: Negative for nausea, abdominal pain and diarrhea.   Genitourinary: Negative for dysuria.  Musculoskeletal: Positive for joint pain. Negative for falls.       Right hip pain with radicular symptoms  Skin: Negative for rash.  Neurological: Negative for loss of consciousness and headaches.  Endo/Heme/Allergies: Negative for polydipsia.  Psychiatric/Behavioral: Negative for depression and suicidal ideas. The patient is not nervous/anxious and does not have insomnia.     Objective  BP 118/74  Pulse 94  Temp(Src) 98 F (36.7 C) (Oral)  Ht 6' (1.829 m)  Wt 187 lb 0.6 oz (84.841 kg)  BMI 25.36 kg/m2  SpO2 97%  Physical Exam  Physical Exam  Constitutional: He is oriented to person, place, and time and well-developed, well-nourished, and in no distress. No distress.  HENT:  Head: Normocephalic and atraumatic.  Eyes: Conjunctivae are normal.  Neck: Neck supple. No thyromegaly present.  Cardiovascular: Normal rate, regular rhythm and normal heart sounds.   No murmur heard. Pulmonary/Chest: Effort normal and breath sounds normal. No respiratory distress.  Abdominal: He exhibits no distension and no mass. There is no tenderness.  Musculoskeletal: He exhibits no edema.  Neurological: He is alert and oriented to person, place, and time.  Skin: Skin is warm.  Psychiatric: Memory, affect and judgment normal.    No results found for this basename: TSH   Lab Results  Component Value Date   WBC 5.5 11/22/2012   HGB 14.9 11/22/2012   HCT 43.1 11/22/2012   MCV 86.9 11/22/2012   PLT 194 11/22/2012   Lab Results  Component Value Date   CREATININE 0.73 11/22/2012   BUN 18 11/22/2012   NA 136 11/22/2012   K 4.8 11/22/2012   CL 102 11/22/2012   CO2 25 11/22/2012   Lab Results  Component Value Date   ALT 20 08/09/2012   AST 16 08/09/2012   ALKPHOS 91 08/09/2012   BILITOT 0.4 08/09/2012   Lab Results  Component Value Date   CHOL 115 08/09/2012   Lab Results  Component Value Date   HDL 38* 08/09/2012   Lab Results  Component Value  Date   LDLCALC 46 08/09/2012   Lab Results  Component Value Date   TRIG 155* 08/09/2012   Lab Results  Component Value Date   CHOLHDL 3.0 08/09/2012     Assessment & Plan  Type II or unspecified type diabetes mellitus without mention of complication, uncontrolled Is tolerating Metformin better in the XR version, no more GI upset so is taking meds more routinely. Continue current meds and minimzie simple carbs  Other and unspecified  hyperlipidemia Tolerating Atorvastatin, avoid trans fats, continue krill oil caps.  Sciatica of right side Given rx for Robaxin and Naproxen. Encouraged moist heat and gentle stretching

## 2012-12-22 NOTE — Assessment & Plan Note (Signed)
Is tolerating Metformin better in the XR version, no more GI upset so is taking meds more routinely. Continue current meds and minimzie simple carbs

## 2012-12-25 ENCOUNTER — Encounter: Payer: Self-pay | Admitting: Neurology

## 2012-12-25 ENCOUNTER — Ambulatory Visit (INDEPENDENT_AMBULATORY_CARE_PROVIDER_SITE_OTHER): Payer: 59 | Admitting: Neurology

## 2012-12-25 VITALS — BP 124/80 | HR 90 | Temp 97.8°F | Resp 12 | Ht 72.0 in | Wt 191.0 lb

## 2012-12-25 DIAGNOSIS — G43819 Other migraine, intractable, without status migrainosus: Secondary | ICD-10-CM

## 2012-12-25 DIAGNOSIS — G43119 Migraine with aura, intractable, without status migrainosus: Secondary | ICD-10-CM

## 2012-12-25 MED ORDER — KETOROLAC TROMETHAMINE 60 MG/2ML IM SOLN
60.0000 mg | Freq: Once | INTRAMUSCULAR | Status: AC
  Administered 2012-12-25: 60 mg via INTRAMUSCULAR

## 2012-12-25 MED ORDER — GABAPENTIN 300 MG PO CAPS: 300.0000 mg | ORAL_CAPSULE | Freq: Every day | ORAL | Status: DC

## 2012-12-25 NOTE — Addendum Note (Signed)
Addended by: Benay Spice on: 12/25/2012 09:09 AM   Modules accepted: Orders

## 2012-12-25 NOTE — Patient Instructions (Addendum)
Follow up with Dr. Smiley Houseman in one month.  Try the Neurontin (Gabapentin) as directed.

## 2012-12-25 NOTE — Progress Notes (Signed)
Albert [name] is a 57 year old male with daily headaches with some migraine features.  He returns for two-week followup.  Following the Toradol injection and the JS J. Treatment, he did not have a headache for 48 hours.  Then the headaches are much less well taking 6 a steroid pack.  They have returned not quite as bad as before but still frequently and he continues not to sleep well.  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  When he was at [employer] he could work 3-4 nights of around and then have several nights off and that suited him much better. The sciatica is also improved since taking a steroid pack and he is doing some stretches. The headaches continue to be frontotemporal bilateral headaches occasionally with some features of nausea or light sensitivity.  The cambia did not help.  He has a headache currently and would like to get another Toradol injection.  Review of symptoms is positive for sleep disturbance and fatigue due to the sleep lack and the working schedule is currently under.  Remainder of the review of systems reveals that the sciatica has improved but not completely gone.  Otherwise unremarkable.  Past Medical History  Diagnosis Date  . Diabetes mellitus   . Hypertension   . History of chicken pox     childhood  . Seasonal allergies   . History of kidney stones   . Hyperlipidemia   . 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 Sciatica of right side 12/22/2012    Current Outpatient Prescriptions on File Prior to Visit  Medication Sig Dispense Refill  . aspirin EC 81 MG tablet Take 81 mg by mouth daily.      Marland Kitchen atorvastatin (LIPITOR) 20 MG tablet TAKE 1 TABLET BY MOUTH DAILY.  30 tablet  6  . cetirizine (ZYRTEC) 10 MG tablet Take 10 mg by mouth daily.      Marland Kitchen glucose blood (ONE TOUCH ULTRA TEST) test strip Use as instructed  100 each  2  . ibuprofen (ADVIL,MOTRIN) 200 MG tablet Take 400 mg by mouth as needed.  Patient used this medication for his pain.      . Liniments (BLUE-EMU SUPER STRENGTH EX) Apply 1 application topically every morning. Patient used this medication on his leg for pain.      Marland Kitchen lisinopril (PRINIVIL,ZESTRIL) 5 MG tablet Take 1 tablet (5 mg total) by mouth daily.  30 tablet  2  . metFORMIN (GLUCOPHAGE-XR) 500 MG 24 hr tablet TAKE 2 TABLETS (1,000 MG TOTAL) BY MOUTH DAILY WITH BREAKFAST. START WITH 2 TABS THEN INCREASE TO 4 AS TOLERATED  60 tablet  5  . methocarbamol (ROBAXIN) 500 MG tablet Take 1 tablet (500 mg total) by mouth 3 (three) times daily as needed.  60 tablet  2  . naproxen (NAPROSYN) 375 MG tablet Take 1 tablet (375 mg total) by mouth 2 (two) times daily with a meal.  40 tablet  2  . NON FORMULARY GNC PROSTATE & VERILITY.      . sildenafil (VIAGRA) 100 MG tablet Take 0.5-1 tablets (50-100 mg total) by mouth daily as needed for erectile dysfunction.  9 tablet  2  . sitaGLIPtin (JANUVIA) 100 MG tablet Take 1 tablet (100 mg total) by mouth daily.  30 tablet  1   No current facility-administered medications on file prior to visit.   Contrast media and Iodine  History  Social History  . Marital Status: Married    Spouse Name: N/A    Number of Children: N/A  . Years of Education: N/A   Occupational History  . Not on file.   Social History Main Topics  . Smoking status: Never Smoker   . Smokeless tobacco: Never Used  . Alcohol Use: No  . Drug Use: No  . Sexually Active: Not on file   Other Topics Concern  . Not on file   Social History Narrative  . No narrative on file    Family History  Problem Relation Age of Onset  . Lung cancer Mother   . Breast cancer Neg Hx   . Colon cancer Neg Hx   . Prostate cancer Neg Hx   . Heart disease Neg Hx   . Diabetes Maternal Aunt   . Cancer Paternal Grandmother     BP 124/80  Pulse 90  Temp(Src) 97.8 F (36.6 C)  Resp 12  Ht 6' (1.829 m)  Wt 191 lb (86.637 kg)  BMI 25.9 kg/m2   Alert and oriented x 3.  Memory  function appears to be intact.  Concentration and attention are normal for educational level and background.  Speech is fluent and without significant word finding difficulty.  Is aware of current events.he does have a significant frontotemporal headache at this time.  No carotid bruits detected.  Cranial nerve II through XII are within normal limits.  This includes normal optic discs and acuity, EOMI, PERLA, facial movement and sensation intact, hearing grossly intact, gag intact,Uvula raises symmetrically and tongue protrudes evenly. Motor strength is 5 over 5 throughout all limbs.  No atrophy, abnormal tone or tremors. Reflexes are 1-2+ in the upper extremities, 1+ at the knees, trace at the left ankle and absent at the right ankle. Sensory exam is intact except for some decrease of pinprick in the toes. Coordination is intact for fine movements and rapid alternating movements in all limbs  Impression: 1. Daily frontotemporal headache with some migraine features, improved but it did return. 2. Sciatica, improved.  Plan: Toradol 60 IM x1 now Neurontin 300 mg q.h.s. Return in one month. Gait and station are normal.

## 2013-01-09 ENCOUNTER — Ambulatory Visit (INDEPENDENT_AMBULATORY_CARE_PROVIDER_SITE_OTHER): Payer: 59 | Admitting: Physician Assistant

## 2013-01-09 DIAGNOSIS — R0789 Other chest pain: Secondary | ICD-10-CM

## 2013-01-09 DIAGNOSIS — I4510 Unspecified right bundle-branch block: Secondary | ICD-10-CM

## 2013-01-09 DIAGNOSIS — E119 Type 2 diabetes mellitus without complications: Secondary | ICD-10-CM

## 2013-01-09 NOTE — Progress Notes (Signed)
Albert [name] is a 57 y.o. male referred by PCP for ETTdue to new RBBB on ECG. He has PMH of DM2, HTN, HL.  Non-smoker. No FHx of CAD.  Notes a LHC in 2004 in [place] that was "normal."  No recent hx of CP, SOB, syncope.  Exam unremarkable.  ECG with NSR, iRBBB, no acute changes.  Exercise Treadmill Test  Pre-Exercise Testing Evaluation Rhythm: normal sinus  Rate: 99     Test  Exercise Tolerance Test Ordering MD: Sandford Craze, NP  Interpreting MD: Tereso Newcomer, PA-C  Unique Test No: 1  Treadmill:  1  Indication for ETT: chest pain - rule out ischemia  Contraindication to ETT: No   Stress Modality: exercise - treadmill  Cardiac Imaging Performed: non   Protocol: standard Bruce - maximal  Max BP:  179/72  Max MPHR (bpm):  166 85% MPR (bpm): 141  MPHR obtained (bpm):  162 % MPHR obtained:  98  Reached 85% MPHR (min:sec):  4:52 Total Exercise Time (min-sec):  7:00  Workload in METS:  8.5 Borg Scale: 15  Reason ETT Terminated:  patient's desire to stop    ST Segment Analysis At Rest: non-specific ST segment slurring With Exercise: non-specific ST changes  Other Information Arrhythmia:  No Angina during ETT:  absent (0) Quality of ETT:  diagnostic  ETT Interpretation:  normal - no evidence of ischemia by ST analysis  Comments: Fair exercise tolerance. No chest pain. Normal BP response to exercise. No significant ST-T changes to suggest ischemia.   Recommendations: F/u with PCP as directed. Signed,  Tereso Newcomer, PA-C   01/09/2013 12:35 PM

## 2013-01-09 NOTE — Progress Notes (Signed)
Please let pt know exercise tolerance test normal.

## 2013-01-10 NOTE — Progress Notes (Signed)
Left detailed message on voicemail and to call if any questions. 

## 2013-01-29 ENCOUNTER — Encounter: Payer: Self-pay | Admitting: Neurology

## 2013-01-29 ENCOUNTER — Ambulatory Visit (INDEPENDENT_AMBULATORY_CARE_PROVIDER_SITE_OTHER): Payer: 59 | Admitting: Neurology

## 2013-01-29 VITALS — BP 106/66 | HR 88 | Temp 97.9°F | Resp 12 | Ht 72.0 in | Wt 189.0 lb

## 2013-01-29 DIAGNOSIS — G43809 Other migraine, not intractable, without status migrainosus: Secondary | ICD-10-CM

## 2013-01-29 NOTE — Patient Instructions (Addendum)
Follow up in 6 months with Dr. Everlena Cooper.  Continue Neurontin as needed.

## 2013-01-29 NOTE — Progress Notes (Signed)
Albert [name] returns for one-month followup. He initially took the Neurontin every night for a week or 2.  He is now sleeping much better, and he is doing a stress much better. He is exercising more regularly and is not letting things bother him.  He still sometimes has an issue with something at work or with a family member and then he can feel a tightening in the liters to the neck on the left upper cervical area near the midline.  If she notices that she can take the Neurontin 300 mg and within 40 minutes it usually goes away.  If he does not taken Neurontin, it will lead into one of his headaches or migraines.  He is improved to the point where he is just taking the Neurontin if needed.  The last 2 weeks been very good it is probably been 2 weeks since his taken one.  Also, seeing the connection between how he relates to stress and the response of his body has helped him to deal with things better.  Review of symptoms is positive for regular aches and pains but seemed to come with age.  He had a couple spider bites out walking on the trails.  Review of systems is otherwise negative.  Past Medical History  Diagnosis Date  . Diabetes mellitus   . Hypertension   . History of chicken pox     childhood  . Seasonal allergies   . History of kidney stones   . Hyperlipidemia   . 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 Sciatica of right side 12/22/2012    Current Outpatient Prescriptions on File Prior to Visit  Medication Sig Dispense Refill  . aspirin EC 81 MG tablet Take 81 mg by mouth daily.      Marland Kitchen atorvastatin (LIPITOR) 20 MG tablet TAKE 1 TABLET BY MOUTH DAILY.  30 tablet  6  . cetirizine (ZYRTEC) 10 MG tablet Take 10 mg by mouth daily.      Marland Kitchen gabapentin (NEURONTIN) 300 MG capsule Take 1 capsule (300 mg total) by mouth at bedtime.  60 capsule  11  . glucose blood (ONE TOUCH ULTRA TEST) test strip Use as instructed  100 each  2  . ibuprofen (ADVIL,MOTRIN) 200 MG tablet  Take 400 mg by mouth as needed. Patient used this medication for his pain.      . Liniments (BLUE-EMU SUPER STRENGTH EX) Apply 1 application topically every morning. Patient used this medication on his leg for pain.      Marland Kitchen lisinopril (PRINIVIL,ZESTRIL) 5 MG tablet Take 1 tablet (5 mg total) by mouth daily.  30 tablet  2  . metFORMIN (GLUCOPHAGE-XR) 500 MG 24 hr tablet TAKE 2 TABLETS (1,000 MG TOTAL) BY MOUTH DAILY WITH BREAKFAST. START WITH 2 TABS THEN INCREASE TO 4 AS TOLERATED  60 tablet  5  . methocarbamol (ROBAXIN) 500 MG tablet Take 1 tablet (500 mg total) by mouth 3 (three) times daily as needed.  60 tablet  2  . naproxen (NAPROSYN) 375 MG tablet Take 1 tablet (375 mg total) by mouth 2 (two) times daily with a meal.  40 tablet  2  . NON FORMULARY GNC PROSTATE & VERILITY.      . sildenafil (VIAGRA) 100 MG tablet Take 0.5-1 tablets (50-100 mg total) by mouth daily as needed for erectile dysfunction.  9 tablet  2  . sitaGLIPtin (JANUVIA) 100 MG tablet Take 1 tablet (100 mg total)  by mouth daily.  30 tablet  1   No current facility-administered medications on file prior to visit.   Contrast media and Iodine  Allergies History   Social History  . Marital Status: Married    Spouse Name: N/A    Number of Children: N/A  . Years of Education: N/A   Occupational History  . Not on file.   Social History Main Topics  . Smoking status: Never Smoker   . Smokeless tobacco: Never Used  . Alcohol Use: No  . Drug Use: No  . Sexually Active: Not on file   Other Topics Concern  . Not on file   Social History Narrative  . No narrative on file    Family History  Problem Relation Age of Onset  . Lung cancer Mother   . Breast cancer Neg Hx   . Colon cancer Neg Hx   . Prostate cancer Neg Hx   . Heart disease Neg Hx   . Diabetes Maternal Aunt   . Cancer Paternal Grandmother     BP 106/66  Pulse 88  Temp(Src) 97.9 F (36.6 C)  Resp 12  Ht 6' (1.829 m)  Wt 189 lb (85.73 kg)  BMI 25.63  kg/m2  Alert and oriented x 3.  Memory function appears to be intact.  Concentration and attention are normal for educational level and background.  Speech is fluent and without significant word finding difficulty.  Is aware of current events.  No carotid bruits detected.  Cranial nerve II through XII are within normal limits.  This includes normal optic discs and acuity, EOMI, PERLA, facial movement and sensation intact, hearing grossly intact, gag intact,Uvula raises symmetrically and tongue protrudes evenly. Motor strength is 5 over 5 throughout all limbs.  No atrophy, abnormal tone or tremors. Reflexes are 2+ and symmetric in the upper and lower extremities Sensory exam is intact. Coordination is intact for fine movements and rapid alternating movements in all limbs Gait and station are normal.   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  He has responded to the various medications and cleaning most recently Neurontin 300 mg at night and now Neurontin 300 mg p.r.n.Marland Kitchen  Plan: He will continue the Neurontin p.r.n. Or increased to q.h.s. If the headache pattern worsens. Return here in 6 months for check up.

## 2013-02-21 ENCOUNTER — Telehealth: Payer: Self-pay | Admitting: *Deleted

## 2013-02-21 DIAGNOSIS — IMO0001 Reserved for inherently not codable concepts without codable children: Secondary | ICD-10-CM

## 2013-02-21 DIAGNOSIS — E785 Hyperlipidemia, unspecified: Secondary | ICD-10-CM

## 2013-02-21 LAB — HEPATIC FUNCTION PANEL
Albumin: 4.2 g/dL (ref 3.5–5.2)
Alkaline Phosphatase: 64 U/L (ref 39–117)
Total Bilirubin: 0.6 mg/dL (ref 0.3–1.2)

## 2013-02-21 LAB — RENAL FUNCTION PANEL
Albumin: 4.2 g/dL (ref 3.5–5.2)
CO2: 28 mEq/L (ref 19–32)
Creat: 0.72 mg/dL (ref 0.50–1.35)
Glucose, Bld: 242 mg/dL — ABNORMAL HIGH (ref 70–99)

## 2013-02-21 LAB — CBC
MCH: 29.7 pg (ref 26.0–34.0)
MCV: 85.1 fL (ref 78.0–100.0)
Platelets: 225 10*3/uL (ref 150–400)
RDW: 14.2 % (ref 11.5–15.5)
WBC: 7.2 10*3/uL (ref 4.0–10.5)

## 2013-02-21 LAB — TSH: TSH: 1.938 u[IU]/mL (ref 0.350–4.500)

## 2013-02-21 LAB — LIPID PANEL
HDL: 38 mg/dL — ABNORMAL LOW (ref >39)
Total CHOL/HDL Ratio: 3.2 Ratio

## 2013-02-21 LAB — HEMOGLOBIN A1C: Mean Plasma Glucose: 174 mg/dL — ABNORMAL HIGH (ref <117)

## 2013-02-21 NOTE — Telephone Encounter (Signed)
Pt presented to the lab. Orders entered per 12/2012 office note as below:  Labs prior lipid, renal, cbc, hgba1c, hepatic, tsh

## 2013-02-25 ENCOUNTER — Telehealth: Payer: Self-pay | Admitting: Internal Medicine

## 2013-02-25 ENCOUNTER — Ambulatory Visit (INDEPENDENT_AMBULATORY_CARE_PROVIDER_SITE_OTHER): Payer: 59 | Admitting: Family Medicine

## 2013-02-25 ENCOUNTER — Encounter: Payer: Self-pay | Admitting: Family Medicine

## 2013-02-25 VITALS — BP 150/100 | HR 87 | Temp 98.1°F | Ht 72.0 in

## 2013-02-25 DIAGNOSIS — I10 Essential (primary) hypertension: Secondary | ICD-10-CM

## 2013-02-25 DIAGNOSIS — E785 Hyperlipidemia, unspecified: Secondary | ICD-10-CM

## 2013-02-25 DIAGNOSIS — R030 Elevated blood-pressure reading, without diagnosis of hypertension: Secondary | ICD-10-CM

## 2013-02-25 DIAGNOSIS — G4700 Insomnia, unspecified: Secondary | ICD-10-CM

## 2013-02-25 DIAGNOSIS — Z0000 Encounter for general adult medical examination without abnormal findings: Secondary | ICD-10-CM

## 2013-02-25 DIAGNOSIS — IMO0001 Reserved for inherently not codable concepts without codable children: Secondary | ICD-10-CM

## 2013-02-25 HISTORY — DX: Insomnia, unspecified: G47.00

## 2013-02-25 MED ORDER — LISINOPRIL 10 MG PO TABS: 10.0000 mg | ORAL_TABLET | Freq: Every day | ORAL | Status: DC

## 2013-02-25 MED ORDER — ZOLPIDEM TARTRATE 10 MG PO TABS: 10.0000 mg | ORAL_TABLET | Freq: Every evening | ORAL | Status: DC | PRN

## 2013-02-25 NOTE — Assessment & Plan Note (Signed)
hgba1c of 7.7 encouraged decreased simple carb intake and increase exercise. No changes to meds today but recheck at next visit.

## 2013-02-25 NOTE — Telephone Encounter (Signed)
Labs prior to visit, lipid, renal, cbc, tsh, hgba1c, hepatic, tsh   Patient has appointment mid November/2014. He will be going to [facility] lab.

## 2013-02-25 NOTE — Assessment & Plan Note (Signed)
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

## 2013-02-25 NOTE — Assessment & Plan Note (Signed)
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

## 2013-02-25 NOTE — Progress Notes (Signed)
[name], male   DOB: 10/28/1955, 57 y.o.   MRN: [id number] Albert [name] [id number] June 28, 1956 02/25/2013      Progress Note-Follow Up  Subjective  Chief Complaint  Chief Complaint  Patient presents with  . Follow-up    2 month    HPI  Patient is a 57 year old Caucasian male who is in today for followup. His blood sugars have been running slightly high and he acknowledges being under a great deal of stress. 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 Notes recently that job has become more stressful and he is sleeping poorly. He has trouble falling asleep and staying asleep and never gets 7 hours. As a result of struggling malaise and irritability. No recent illness. No fevers or chills. No chest pain or palpitations. No GI or GU concerns. Is taking his medications as prescribed. Has tried melatonin and Benadryl containing products without any good relief of his insomnia.  Past Medical History  Diagnosis Date  . Diabetes mellitus   . Hypertension   . History of chicken pox     childhood  . Seasonal allergies   . History of kidney stones   . Hyperlipidemia   . 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 Sciatica of right side 12/22/2012  . Insomnia 02/25/2013    Past Surgical History  Procedure Laterality Date  . Hernia repair    . Abdominal hernia repair    . Rotator cuff repair    . Cholecystectomy    . Mass excision  05/22/2012    Procedure: EXCISION MASS;  Surgeon: [name], MD;  Location: [facility];  Service: General;  Laterality: Left;  excision abdominal wall mass    Family History  Problem Relation Age of Onset  . Lung cancer Mother   . Breast cancer Neg Hx   . Colon cancer Neg Hx   . Prostate cancer Neg Hx   . Heart disease Neg Hx   . Diabetes Maternal Aunt   . Cancer Paternal Grandmother     History   Social History  . Marital Status: Married   Spouse Name: N/A    Number of Children: N/A  . Years of Education: N/A   Occupational History  . Not on file.   Social History Main Topics  . Smoking status: Never Smoker   . Smokeless tobacco: Never Used  . Alcohol Use: No  . Drug Use: No  . Sexually Active: Not on file   Other Topics Concern  . Not on file   Social History Narrative  . No narrative on file    Current Outpatient Prescriptions on File Prior to Visit  Medication Sig Dispense Refill  . aspirin EC 81 MG tablet Take 81 mg by mouth daily.      Marland Kitchen atorvastatin (LIPITOR) 20 MG tablet TAKE 1 TABLET BY MOUTH DAILY.  30 tablet  6  . cetirizine (ZYRTEC) 10 MG tablet Take 10 mg by mouth daily.      Marland Kitchen gabapentin (NEURONTIN) 300 MG capsule Take 1 capsule (300 mg total) by mouth at bedtime.  60 capsule  11  . glucose blood (ONE TOUCH ULTRA TEST) test strip Use as instructed  100 each  2  . ibuprofen (ADVIL,MOTRIN) 200 MG tablet Take 400 mg by mouth as needed. Patient used this medication for his pain.      Marland Kitchen  Liniments (BLUE-EMU SUPER STRENGTH EX) Apply 1 application topically every morning. Patient used this medication on his leg for pain.      . metFORMIN (GLUCOPHAGE-XR) 500 MG 24 hr tablet TAKE 2 TABLETS (1,000 MG TOTAL) BY MOUTH DAILY WITH BREAKFAST. START WITH 2 TABS THEN INCREASE TO 4 AS TOLERATED  60 tablet  5  . methocarbamol (ROBAXIN) 500 MG tablet Take 1 tablet (500 mg total) by mouth 3 (three) times daily as needed.  60 tablet  2  . naproxen (NAPROSYN) 375 MG tablet Take 1 tablet (375 mg total) by mouth 2 (two) times daily with a meal.  40 tablet  2  . sildenafil (VIAGRA) 100 MG tablet Take 0.5-1 tablets (50-100 mg total) by mouth daily as needed for erectile dysfunction.  9 tablet  2  . sitaGLIPtin (JANUVIA) 100 MG tablet Take 1 tablet (100 mg total) by mouth daily.  30 tablet  1   No current facility-administered medications on file prior to visit.    Allergies  Allergen Reactions  . Contrast Media (Iodinated  Diagnostic Agents)   . Iodine Swelling    Review of Systems  Review of Systems  Constitutional: Positive for malaise/fatigue. Negative for fever.  HENT: Negative for congestion.   Eyes: Negative for pain and discharge.  Respiratory: Negative for shortness of breath.   Cardiovascular: Negative for chest pain, palpitations and leg swelling.  Gastrointestinal: Negative for nausea, abdominal pain and diarrhea.  Genitourinary: Negative for dysuria.  Musculoskeletal: Negative for falls.  Skin: Negative for rash.  Neurological: Negative for loss of consciousness and headaches.  Endo/Heme/Allergies: Negative for polydipsia.  Psychiatric/Behavioral: Negative for depression and suicidal ideas. The patient has insomnia. The patient is not nervous/anxious.     Objective  BP 150/100  Pulse 87  Temp(Src) 98.1 F (36.7 C) (Oral)  Ht 6' (1.829 m)  SpO2 97%  Physical Exam  Physical Exam  Constitutional: He is oriented to person, place, and time and well-developed, well-nourished, and in no distress. No distress.  HENT:  Head: Normocephalic and atraumatic.  Eyes: Conjunctivae are normal.  Neck: Neck supple. No thyromegaly present.  Cardiovascular: Normal rate, regular rhythm and normal heart sounds.  Exam reveals no gallop.   No murmur heard. Pulmonary/Chest: Effort normal and breath sounds normal. No respiratory distress.  Abdominal: He exhibits no distension and no mass. There is no tenderness.  Musculoskeletal: He exhibits no edema.  Neurological: He is alert and oriented to person, place, and time.  Skin: Skin is warm.  Psychiatric: Memory, affect and judgment normal.    Lab Results  Component Value Date   TSH 1.938 02/21/2013   Lab Results  Component Value Date   WBC 7.2 02/21/2013   HGB 14.1 02/21/2013   HCT 40.4 02/21/2013   MCV 85.1 02/21/2013   PLT 225 02/21/2013   Lab Results  Component Value Date   CREATININE 0.72 02/21/2013   BUN 17 02/21/2013   NA 137 02/21/2013   K  4.5 02/21/2013   CL 101 02/21/2013   CO2 28 02/21/2013   Lab Results  Component Value Date   ALT 19 02/21/2013   AST 15 02/21/2013   ALKPHOS 64 02/21/2013   BILITOT 0.6 02/21/2013   Lab Results  Component Value Date   CHOL 121 02/21/2013   Lab Results  Component Value Date   HDL 38* 02/21/2013   Lab Results  Component Value Date   LDLCALC 63 02/21/2013   Lab Results  Component Value Date  TRIG 99 02/21/2013   Lab Results  Component Value Date   CHOLHDL 3.2 02/21/2013     Assessment & Plan  Elevated blood pressure reading without diagnosis of hypertension Just coming off of a long work over night shift and very tired, will increase Lisinopril to 10 mg daily and reassess at next visit.  Type II or unspecified type diabetes mellitus without mention of complication, uncontrolled hgba1c of 7.7 encouraged decreased simple carb intake and increase exercise. No changes to meds today but recheck at next visit.  Insomnia Recently worsened due to shift work and stress, encouraged good sleep hygiene and given rx for Ambien to use 5 to 1o mg prn sparingly

## 2013-02-25 NOTE — Patient Instructions (Addendum)
Labs prior to visit, lipid, renal, cbc, tsh, hgba1c, hepatic, tsh  Insomnia Insomnia is frequent trouble falling and/or staying asleep. Insomnia can be a long term problem or a short term problem. Both are common. Insomnia can be a short term problem when the wakefulness is related to a certain stress or worry. Long term insomnia is often related to ongoing stress during waking hours and/or poor sleeping habits. Overtime, sleep deprivation itself can make the problem worse. Every little thing feels more severe because you are overtired and your ability to cope is decreased. CAUSES   Stress, anxiety, and depression.  Poor sleeping habits.  Distractions such as TV in the bedroom.  Naps close to bedtime.  Engaging in emotionally charged conversations before bed.  Technical reading before sleep.  Alcohol and other sedatives. They may make the problem worse. They can hurt normal sleep patterns and normal dream activity.  Stimulants such as caffeine for several hours prior to bedtime.  Pain syndromes and shortness of breath can cause insomnia.  Exercise late at night.  Changing time zones may cause sleeping problems (jet lag). It is sometimes helpful to have someone observe your sleeping patterns. They should look for periods of not breathing during the night (sleep apnea). They should also look to see how long those periods last. If you live alone or observers are uncertain, you can also be observed at a sleep clinic where your sleep patterns will be professionally monitored. Sleep apnea requires a checkup and treatment. Give your caregivers your medical history. Give your caregivers observations your family has made about your sleep.  SYMPTOMS   Not feeling rested in the morning.  Anxiety and restlessness at bedtime.  Difficulty falling and staying asleep. TREATMENT   Your caregiver may prescribe treatment for an underlying medical disorders. Your caregiver can give advice or help if  you are using alcohol or other drugs for self-medication. Treatment of underlying problems will usually eliminate insomnia problems.  Medications can be prescribed for short time use. They are generally not recommended for lengthy use.  Over-the-counter sleep medicines are not recommended for lengthy use. They can be habit forming.  You can promote easier sleeping by making lifestyle changes such as:  Using relaxation techniques that help with breathing and reduce muscle tension.  Exercising earlier in the day.  Changing your diet and the time of your last meal. No night time snacks.  Establish a regular time to go to bed.  Counseling can help with stressful problems and worry.  Soothing music and white noise may be helpful if there are background noises you cannot remove.  Stop tedious detailed work at least one hour before bedtime. HOME CARE INSTRUCTIONS   Keep a diary. Inform your caregiver about your progress. This includes any medication side effects. See your caregiver regularly. Take note of:  Times when you are asleep.  Times when you are awake during the night.  The quality of your sleep.  How you feel the next day. This information will help your caregiver care for you.  Get out of bed if you are still awake after 15 minutes. Read or do some quiet activity. Keep the lights down. Wait until you feel sleepy and go back to bed.  Keep regular sleeping and waking hours. Avoid naps.  Exercise regularly.  Avoid distractions at bedtime. Distractions include watching television or engaging in any intense or detailed activity like attempting to balance the household checkbook.  Develop a bedtime ritual. Keep a familiar  routine of bathing, brushing your teeth, climbing into bed at the same time each night, listening to soothing music. Routines increase the success of falling to sleep faster.  Use relaxation techniques. This can be using breathing and muscle tension release  routines. It can also include visualizing peaceful scenes. You can also help control troubling or intruding thoughts by keeping your mind occupied with boring or repetitive thoughts like the old concept of counting sheep. You can make it more creative like imagining planting one beautiful flower after another in your backyard garden.  During your day, work to eliminate stress. When this is not possible use some of the previous suggestions to help reduce the anxiety that accompanies stressful situations. MAKE SURE YOU:   Understand these instructions.  Will watch your condition.  Will get help right away if you are not doing well or get worse. Document Released: 07/28/2000 Document Revised: 10/23/2011 Document Reviewed: 08/28/2007 [facility] Patient Information 2014 [place], [place].

## 2013-02-27 ENCOUNTER — Other Ambulatory Visit: Payer: Self-pay | Admitting: Family Medicine

## 2013-02-27 ENCOUNTER — Other Ambulatory Visit: Payer: Self-pay | Admitting: Family

## 2013-02-27 NOTE — Telephone Encounter (Signed)
RX sent

## 2013-02-27 NOTE — Telephone Encounter (Signed)
Just switched him to 10 mg can d/c 5 mg rx

## 2013-02-27 NOTE — Telephone Encounter (Signed)
Please advise if pt is supposed to be taking Lisinopril 5 or 10 mg?

## 2013-03-07 ENCOUNTER — Ambulatory Visit (HOSPITAL_BASED_OUTPATIENT_CLINIC_OR_DEPARTMENT_OTHER)
Admission: RE | Admit: 2013-03-07 | Discharge: 2013-03-07 | Disposition: A | Discharge status: Home / Self Care | Payer: 59 | Source: Ambulatory Visit | Attending: Family Medicine | Admitting: Family Medicine

## 2013-03-07 ENCOUNTER — Ambulatory Visit (INDEPENDENT_AMBULATORY_CARE_PROVIDER_SITE_OTHER): Payer: 59 | Admitting: Family Medicine

## 2013-03-07 ENCOUNTER — Telehealth: Payer: Self-pay

## 2013-03-07 ENCOUNTER — Telehealth: Payer: Self-pay | Admitting: Family Medicine

## 2013-03-07 ENCOUNTER — Other Ambulatory Visit (INDEPENDENT_AMBULATORY_CARE_PROVIDER_SITE_OTHER): Payer: Self-pay | Admitting: Surgery

## 2013-03-07 ENCOUNTER — Encounter: Payer: Self-pay | Admitting: Family Medicine

## 2013-03-07 VITALS — BP 136/90 | HR 81 | Temp 98.0°F | Ht 72.0 in | Wt 186.1 lb

## 2013-03-07 DIAGNOSIS — R197 Diarrhea, unspecified: Secondary | ICD-10-CM

## 2013-03-07 DIAGNOSIS — I700 Atherosclerosis of aorta: Secondary | ICD-10-CM | POA: Insufficient documentation

## 2013-03-07 DIAGNOSIS — R109 Unspecified abdominal pain: Secondary | ICD-10-CM | POA: Insufficient documentation

## 2013-03-07 DIAGNOSIS — K429 Umbilical hernia without obstruction or gangrene: Secondary | ICD-10-CM | POA: Insufficient documentation

## 2013-03-07 DIAGNOSIS — I708 Atherosclerosis of other arteries: Secondary | ICD-10-CM | POA: Insufficient documentation

## 2013-03-07 DIAGNOSIS — N400 Enlarged prostate without lower urinary tract symptoms: Secondary | ICD-10-CM | POA: Insufficient documentation

## 2013-03-07 DIAGNOSIS — R030 Elevated blood-pressure reading, without diagnosis of hypertension: Secondary | ICD-10-CM

## 2013-03-07 DIAGNOSIS — K8689 Other specified diseases of pancreas: Secondary | ICD-10-CM | POA: Insufficient documentation

## 2013-03-07 LAB — RENAL FUNCTION PANEL
CO2: 24 mEq/L (ref 19–32)
Calcium: 9.8 mg/dL (ref 8.4–10.5)
Glucose, Bld: 157 mg/dL — ABNORMAL HIGH (ref 70–99)
Potassium: 4.8 mEq/L (ref 3.5–5.3)
Sodium: 135 mEq/L (ref 135–145)

## 2013-03-07 LAB — HEPATIC FUNCTION PANEL
Bilirubin, Direct: 0.2 mg/dL (ref 0.0–0.3)
Total Bilirubin: 0.6 mg/dL (ref 0.3–1.2)

## 2013-03-07 LAB — CBC
MCHC: 35.2 g/dL (ref 30.0–36.0)
RDW: 13.1 % (ref 11.5–15.5)

## 2013-03-07 MED ORDER — HYDROCODONE-ACETAMINOPHEN 5-325 MG PO TABS: 1.0000 | ORAL_TABLET | Freq: Four times a day (QID) | ORAL | Status: DC | PRN

## 2013-03-07 NOTE — Progress Notes (Signed)
Quick Note:  Patient Informed and voiced understanding. Instructed pt that MD wanted him to stay home today from work.   RX sent to pharmacy ______

## 2013-03-07 NOTE — Telephone Encounter (Signed)
RX faxed

## 2013-03-07 NOTE — Patient Instructions (Addendum)
Diverticulitis °A diverticulum is a small pouch or sac on the colon. Diverticulosis is the presence of these diverticula on the colon. Diverticulitis is the irritation (inflammation) or infection of diverticula. °CAUSES  °The colon and its diverticula contain bacteria. If food particles block the tiny opening to a diverticulum, the bacteria inside can grow and cause an increase in pressure. This leads to infection and inflammation and is called diverticulitis. °SYMPTOMS  °· Abdominal pain and tenderness. Usually, the pain is located on the left side of your abdomen. However, it could be located elsewhere. °· Fever. °· Bloating. °· Feeling sick to your stomach (nausea). °· Throwing up (vomiting). °· Abnormal stools. °DIAGNOSIS  °Your caregiver will take a history and perform a physical exam. Since many things can cause abdominal pain, other tests may be necessary. Tests may include: °· Blood tests. °· Urine tests. °· X-ray of the abdomen. °· CT scan of the abdomen. °Sometimes, surgery is needed to determine if diverticulitis or other conditions are causing your symptoms. °TREATMENT  °Most of the time, you can be treated without surgery. Treatment includes: °· Resting the bowels by only having liquids for a few days. As you improve, you will need to eat a low-fiber diet. °· Intravenous (IV) fluids if you are losing body fluids (dehydrated). °· Antibiotic medicines that treat infections may be given. °· Pain and nausea medicine, if needed. °· Surgery if the inflamed diverticulum has burst. °HOME CARE INSTRUCTIONS  °· Try a clear liquid diet (broth, tea, or water for as long as directed by your caregiver). You may then gradually begin a low-fiber diet as tolerated.  °A low-fiber diet is a diet with less than 10 grams of fiber. Choose the foods below to reduce fiber in the diet: °· White breads, cereals, rice, and pasta. °· Cooked fruits and vegetables or soft fresh fruits and vegetables without the skin. °· Ground or  well-cooked tender beef, ham, veal, lamb, pork, or poultry. °· Eggs and seafood. °· After your diverticulitis symptoms have improved, your caregiver may put you on a high-fiber diet. A high-fiber diet includes 14 grams of fiber for every 1000 calories consumed. For a standard 2000 calorie diet, you would need 28 grams of fiber. Follow these diet guidelines to help you increase the fiber in your diet. It is important to slowly increase the amount fiber in your diet to avoid gas, constipation, and bloating. °· Choose whole-grain breads, cereals, pasta, and brown rice. °· Choose fresh fruits and vegetables with the skin on. Do not overcook vegetables because the more vegetables are cooked, the more fiber is lost. °· Choose more nuts, seeds, legumes, dried peas, beans, and lentils. °· Look for food products that have greater than 3 grams of fiber per serving on the Nutrition Facts label. °· Take all medicine as directed by your caregiver. °· If your caregiver has given you a follow-up appointment, it is very important that you go. Not going could result in lasting (chronic) or permanent injury, pain, and disability. If there is any problem keeping the appointment, call to reschedule. °SEEK MEDICAL CARE IF:  °· Your pain does not improve. °· You have a hard time advancing your diet beyond clear liquids. °· Your bowel movements do not return to normal. °SEEK IMMEDIATE MEDICAL CARE IF:  °· Your pain becomes worse. °· You have an oral temperature above 102° F (38.9° C), not controlled by medicine. °· You have repeated vomiting. °· You have bloody or black, tarry stools. °·   Symptoms that brought you to your caregiver become worse or are not getting better. °MAKE SURE YOU:  °· Understand these instructions. °· Will watch your condition. °· Will get help right away if you are not doing well or get worse. °Document Released: 05/10/2005 Document Revised: 10/23/2011 Document Reviewed: 09/05/2010 °[employer]® Patient Information  ©2014 [employer]. ° °

## 2013-03-07 NOTE — Telephone Encounter (Signed)
Patient Information:  Caller Name: [place]  Phone: [phone]  Patient: Albert [name], Albert [name]  Gender: Male  DOB: [birth year]  Age: 57 Years  PCP: [name] [facility])  Office Follow Up:  Does the office need to follow up with this patient?: Yes  Instructions For The Office: If Dr. Abner Greenspan agrees with pain med prescription.  RN Note:  Pt declining to go to ED now but will go later if absolutely necessary (works at [employer]).  Requesting prescription for pain med.  Symptoms  Reason For Call & Symptoms: Onset 03/02/2013 left abd pain, rates 10 on 0-10 scale, (left upper and lower quadrants) and diarrhea x 5-6 on 03/07/2013.  Reviewed Health History In EMR: Yes  Reviewed Medications In EMR: Yes  Reviewed Allergies In EMR: Yes  Reviewed Surgeries / Procedures: Yes  Date of Onset of Symptoms: 03/02/2013  Treatments Tried: Imodium 2 tabs and repeat 1 more, helps diarrhea for 24 hours  Treatments Tried Worked: Yes  Guideline(s) Used:  Abdominal Pain - Male  Disposition Per Guideline:   Go to ED Now  Reason For Disposition Reached:   Severe abdominal pain (e.g., excruciating)  Advice Given:  N/A  Patient Refused Recommendation:  Patient Requests Prescription  Requesting prescription for pain med.

## 2013-03-07 NOTE — Telephone Encounter (Signed)
Discussed this with pt per note in CT Results

## 2013-03-11 ENCOUNTER — Encounter: Payer: Self-pay | Admitting: Family Medicine

## 2013-03-11 DIAGNOSIS — R109 Unspecified abdominal pain: Secondary | ICD-10-CM | POA: Insufficient documentation

## 2013-03-11 DIAGNOSIS — R197 Diarrhea, unspecified: Secondary | ICD-10-CM | POA: Insufficient documentation

## 2013-03-11 NOTE — Assessment & Plan Note (Signed)
Start a probiotic and fiber supplements.

## 2013-03-11 NOTE — Assessment & Plan Note (Signed)
Adequate control despite pain, no changes.

## 2013-03-11 NOTE — Progress Notes (Signed)
[name], male   DOB: 1956-06-30, 57 y.o.   MRN: [id number] Albert [name] [id number] December 03, 1955 03/11/2013      Progress Note-Follow Up  Subjective  Chief Complaint  Chief Complaint  Patient presents with  . pain where he had symptoms last October    left side pain    HPI  Patient is-year-old Caucasian male who is in today complaining of sudden onset of abdominal pain. He has been struggling with diarrhea all week several loose stool without any blood or tarry component. Has had some vague abdominal pain in the left side for several days and then last night he sneezed and had a sudden onset of 10 out of 10 pain. Since then he said 6-10 out of 10 pain and is unable to find a comfortable position. No fevers or chills. No chest pain or palpitations. No shortness of breath. Did have a lipoma removed from his anterior abdominal wall and about the spot evening now last October.  Past Medical History  Diagnosis Date  . Diabetes mellitus   . Hypertension   . History of chicken pox     childhood  . Seasonal allergies   . History of kidney stones   . Hyperlipidemia   . 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 Sciatica of right side 12/22/2012  . Insomnia 02/25/2013  . Abdominal pain, unspecified site 03/11/2013    Left side at site of excision     Past Surgical History  Procedure Laterality Date  . Hernia repair    . Abdominal hernia repair    . Rotator cuff repair    . Cholecystectomy    . Mass excision  05/22/2012    Procedure: EXCISION MASS;  Surgeon: [name], MD;  Location: [facility];  Service: General;  Laterality: Left;  excision abdominal wall mass    Family History  Problem Relation Age of Onset  . Lung cancer Mother   . Breast cancer Neg Hx   . Colon cancer Neg Hx   . Prostate cancer Neg Hx   . Heart disease Neg Hx   . Diabetes Maternal Aunt   . Cancer Paternal Grandmother     History    Social History  . Marital Status: Married    Spouse Name: N/A    Number of Children: N/A  . Years of Education: N/A   Occupational History  . Not on file.   Social History Main Topics  . Smoking status: Never Smoker   . Smokeless tobacco: Never Used  . Alcohol Use: No  . Drug Use: No  . Sexually Active: Not on file   Other Topics Concern  . Not on file   Social History Narrative  . No narrative on file    Current Outpatient Prescriptions on File Prior to Visit  Medication Sig Dispense Refill  . aspirin EC 81 MG tablet Take 81 mg by mouth daily.      Marland Kitchen atorvastatin (LIPITOR) 20 MG tablet TAKE 1 TABLET BY MOUTH DAILY.  30 tablet  6  . gabapentin (NEURONTIN) 300 MG capsule Take 1 capsule (300 mg total) by mouth at bedtime.  60 capsule  11  . glucose blood (ONE TOUCH ULTRA TEST) test strip Use as instructed  100 each  2  . ibuprofen (ADVIL,MOTRIN) 200 MG tablet Take 400 mg by mouth as needed. Patient used this medication for his pain.      Marland Kitchen  JANUVIA 100 MG tablet TAKE 1 TABLET (100 MG TOTAL) BY MOUTH DAILY.  30 tablet  2  . Liniments (BLUE-EMU SUPER STRENGTH EX) Apply 1 application topically every morning. Patient used this medication on his leg for pain.      Marland Kitchen lisinopril (PRINIVIL,ZESTRIL) 10 MG tablet Take 1 tablet (10 mg total) by mouth daily.  90 tablet  3  . metFORMIN (GLUCOPHAGE-XR) 500 MG 24 hr tablet TAKE 2 TABLETS BY MOUTH DAILY WITH BREAKFAST. START WITH 2 TABS THEN INCREASE TO 4 AS TOLERATED  60 tablet  5  . methocarbamol (ROBAXIN) 500 MG tablet Take 1 tablet (500 mg total) by mouth 3 (three) times daily as needed.  60 tablet  2  . naproxen (NAPROSYN) 375 MG tablet Take 1 tablet (375 mg total) by mouth 2 (two) times daily with a meal.  40 tablet  2  . sildenafil (VIAGRA) 100 MG tablet Take 0.5-1 tablets (50-100 mg total) by mouth daily as needed for erectile dysfunction.  9 tablet  2  . zolpidem (AMBIEN) 10 MG tablet Take 1 tablet (10 mg total) by mouth at bedtime as  needed for sleep.  20 tablet  1   No current facility-administered medications on file prior to visit.    Allergies  Allergen Reactions  . Contrast Media (Iodinated Diagnostic Agents)   . Iodine Swelling    Review of Systems  Review of Systems  Constitutional: Negative for fever and malaise/fatigue.  HENT: Negative for congestion.   Eyes: Negative for pain and discharge.  Respiratory: Negative for shortness of breath.   Cardiovascular: Negative for chest pain, palpitations and leg swelling.  Gastrointestinal: Positive for abdominal pain and diarrhea. Negative for nausea.  Genitourinary: Negative for dysuria.  Musculoskeletal: Negative for falls.  Skin: Negative for rash.  Neurological: Negative for loss of consciousness and headaches.  Endo/Heme/Allergies: Negative for polydipsia.  Psychiatric/Behavioral: Negative for depression and suicidal ideas. The patient is not nervous/anxious and does not have insomnia.     Objective  BP 136/90  Pulse 81  Temp(Src) 98 F (36.7 C) (Oral)  Ht 6' (1.829 m)  Wt 186 lb 1.3 oz (84.405 kg)  BMI 25.23 kg/m2  SpO2 97%  Physical Exam  Physical Exam  Constitutional: He is oriented to person, place, and time and well-developed, well-nourished, and in no distress. No distress.  HENT:  Head: Normocephalic and atraumatic.  Eyes: Conjunctivae are normal.  Neck: Neck supple. No thyromegaly present.  Cardiovascular: Normal rate, regular rhythm and normal heart sounds.  Exam reveals no gallop.   No murmur heard. Pulmonary/Chest: Effort normal and breath sounds normal. No respiratory distress.  Abdominal: Soft. Bowel sounds are normal. He exhibits no distension and no mass. There is tenderness. There is no rebound and no guarding.  Pain on left side with palpation.   Musculoskeletal: He exhibits no edema.  Neurological: He is alert and oriented to person, place, and time.  Skin: Skin is warm.  Psychiatric: Memory, affect and judgment normal.     Lab Results  Component Value Date   TSH 1.938 02/21/2013   Lab Results  Component Value Date   WBC 8.2 03/07/2013   HGB 15.0 03/07/2013   HCT 42.6 03/07/2013   MCV 88.2 03/07/2013   PLT 225 03/07/2013   Lab Results  Component Value Date   CREATININE 0.77 03/07/2013   BUN 12 03/07/2013   NA 135 03/07/2013   K 4.8 03/07/2013   CL 102 03/07/2013   CO2 24 03/07/2013  Lab Results  Component Value Date   ALT 20 03/07/2013   AST 14 03/07/2013   ALKPHOS 66 03/07/2013   BILITOT 0.6 03/07/2013   Lab Results  Component Value Date   CHOL 121 02/21/2013   Lab Results  Component Value Date   HDL 38* 02/21/2013   Lab Results  Component Value Date   LDLCALC 63 02/21/2013   Lab Results  Component Value Date   TRIG 99 02/21/2013   Lab Results  Component Value Date   CHOLHDL 3.2 02/21/2013     Assessment & Plan  Elevated blood pressure reading without diagnosis of hypertension Adequate control despite pain, no changes.   Abdominal pain, unspecified site Ct scan negative for diverticulitis or acute intraabdominal process. Possible early abdominal wall hernia with some edema noted at excision site. spoke with general surgeon on call they will see him office in follow up, he is given pain meds and taken out of work temporarily  Diarrhea Start a probiotic and fiber supplements.

## 2013-03-11 NOTE — Assessment & Plan Note (Signed)
Ct scan negative for diverticulitis or acute intraabdominal process. Possible early abdominal wall hernia with some edema noted at excision site. spoke with general surgeon on call they will see him office in follow up, he is given pain meds and taken out of work temporarily

## 2013-03-17 ENCOUNTER — Telehealth: Payer: Self-pay | Admitting: Internal Medicine

## 2013-03-17 NOTE — Telephone Encounter (Signed)
Paperwork in a yellow envelope on MD's desk.

## 2013-03-17 NOTE — Telephone Encounter (Signed)
Patient states that he dropped off FMLA forms this morning to be filled out. He says that the # to fax forms to is [phone] attn: Wyman Songster. He says that forms need to be faxed by tomorrow.

## 2013-03-18 NOTE — Telephone Encounter (Signed)
Form done needs our contact info

## 2013-03-18 NOTE — Telephone Encounter (Signed)
Paperwork faxed °

## 2013-03-21 ENCOUNTER — Encounter (INDEPENDENT_AMBULATORY_CARE_PROVIDER_SITE_OTHER): Payer: Self-pay | Admitting: Surgery

## 2013-03-21 ENCOUNTER — Ambulatory Visit (INDEPENDENT_AMBULATORY_CARE_PROVIDER_SITE_OTHER): Payer: Commercial Managed Care - PPO | Admitting: Surgery

## 2013-03-21 VITALS — BP 124/68 | HR 64 | Temp 96.7°F | Resp 14 | Ht 72.0 in | Wt 194.4 lb

## 2013-03-21 DIAGNOSIS — R209 Unspecified disturbances of skin sensation: Secondary | ICD-10-CM

## 2013-03-21 DIAGNOSIS — L7682 Other postprocedural complications of skin and subcutaneous tissue: Secondary | ICD-10-CM

## 2013-03-21 MED ORDER — METAXALONE 800 MG PO TABS: 800.0000 mg | ORAL_TABLET | Freq: Three times a day (TID) | ORAL | Status: DC

## 2013-03-21 MED ORDER — TRAMADOL HCL 50 MG PO TABS: 50.0000 mg | ORAL_TABLET | Freq: Four times a day (QID) | ORAL | Status: DC | PRN

## 2013-03-21 NOTE — Progress Notes (Signed)
Subjective:     [name], male   DOB: [birth year], 57 y.o.   MRN: [id number]  HPI Patient returns due to left sided abdominal pain for a previous scar from lipoma excision from 2013 left upper quadrant. 2 weeks ago he developed severe sudden left abdominal pain at the site of the scar. The pain is sharp in nature made worse with coughing or breathing. The patient is directly over the scar in left upper quadrant. CT scan was done which showed some postsurgical changes but no hernia or other intra-abdominal explanation. The pain is kept him out of work. Mild improvement with heat. He takes intermittent ibuprofen with some relief. No nausea or vomiting. The pain is limiting his quality of life. Diabetes well controlled. No history of neuropathy.  Review of Systems  Respiratory: Negative.   Cardiovascular: Negative.   Gastrointestinal: Positive for abdominal pain.       Objective:   Physical Exam  Constitutional: He is oriented to person, place, and time. He appears well-developed and well-nourished.  HENT:  Head: Normocephalic and atraumatic.  Eyes: Pupils are equal, round, and reactive to light.  Pulmonary/Chest: Effort normal.  Abdominal: There is tenderness. A hernia is present.    Neurological: He is alert and oriented to person, place, and time.  Skin: Skin is warm and dry.  Psychiatric: He has a normal mood and affect. His behavior is normal. Judgment and thought content normal.       Assessment:     Pain and previous lipoma excision site remote from surgery.  Etiology unclear. Could be secondary to neuropathy. No hernia or other cause noted.    Plan:     Long discussion about medical surgical options. This could be secondary to a neuroma. I discussed the possibility of neuropathy as well. He has no interest in surgery. We'll try heat, tramadol and a muscle relaxant to see if this helps. Return in 3 weeks for recheck. Other options include laparoscopy, wound  exploration for neuroma a referral to pain clinic. He was out of work for 2 weeks starting next week and we'll reassess his progress as he goes. I adjusted his medication list and we'll hold his Naprosyn and Robaxin

## 2013-03-21 NOTE — Patient Instructions (Signed)
Stop taking naprosyn and robaxin.  Start taking tramadol and skelaxin as directed. Use heat.  Off work two weeks.

## 2013-04-10 ENCOUNTER — Encounter (INDEPENDENT_AMBULATORY_CARE_PROVIDER_SITE_OTHER): Payer: Self-pay | Admitting: Surgery

## 2013-04-10 ENCOUNTER — Ambulatory Visit (INDEPENDENT_AMBULATORY_CARE_PROVIDER_SITE_OTHER): Payer: Commercial Managed Care - PPO | Admitting: Surgery

## 2013-04-10 VITALS — BP 112/68 | HR 100 | Temp 97.5°F | Resp 16 | Ht 72.0 in | Wt 191.4 lb

## 2013-04-10 DIAGNOSIS — L7682 Other postprocedural complications of skin and subcutaneous tissue: Secondary | ICD-10-CM

## 2013-04-10 DIAGNOSIS — R209 Unspecified disturbances of skin sensation: Secondary | ICD-10-CM

## 2013-04-10 MED ORDER — METAXALONE 800 MG PO TABS: 800.0000 mg | ORAL_TABLET | Freq: Three times a day (TID) | ORAL | Status: DC

## 2013-04-10 MED ORDER — TRAMADOL HCL 50 MG PO TABS: 50.0000 mg | ORAL_TABLET | Freq: Four times a day (QID) | ORAL | Status: DC | PRN

## 2013-04-10 NOTE — Progress Notes (Signed)
Subjective:     [name], male   DOB: 1955/10/30, 57 y.o.   MRN: [id number]  HPI Patient returns due to left sided abdominal pain for a previous scar from lipoma excision from 2013 left upper quadrant. 2 weeks ago he developed severe sudden left abdominal pain at the site of the scar. The pain is sharp in nature made worse with coughing or breathing. The patient is directly over the scar in left upper quadrant. CT scan was done which showed some postsurgical changes but no hernia or other intra-abdominal explanation. The pain is kept him out of work. Mild improvement with heat. He takes intermittent ibuprofen with some relief. No nausea or vomiting. The pain is limiting his quality of life. Diabetes well controlled. No history of neuropathy. He is feeling much better. Less  pain Review of Systems  Respiratory: Negative.   Cardiovascular: Negative.   Gastrointestinal: Positive for abdominal pain.       Objective:   Physical Exam  Constitutional: He is oriented to person, place, and time. He appears well-developed and well-nourished.  HENT:  Head: Normocephalic and atraumatic.  Eyes: Pupils are equal, round, and reactive to light.  Pulmonary/Chest: Effort normal.  Abdominal: There is tenderness. A hernia is present.    Neurological: He is alert and oriented to person, place, and time.  Skin: Skin is warm and dry.  Psychiatric: He has a normal mood and affect. His behavior is normal. Judgment and thought content normal.       Assessment:     Pain and previous lipoma excision site  improved    Plan:     He is better.  Continue medical management and return as needed.

## 2013-04-10 NOTE — Patient Instructions (Signed)
Return if pain worsens

## 2013-05-08 ENCOUNTER — Telehealth: Payer: Self-pay | Admitting: *Deleted

## 2013-05-08 DIAGNOSIS — G4700 Insomnia, unspecified: Secondary | ICD-10-CM

## 2013-05-08 NOTE — Telephone Encounter (Signed)
Faxed refill request received from pharmacy for Zolpidem Last filled by MD on 07.15.14, #[id number]  Last AEX - 07.25.14 Please Advise/SLS

## 2013-05-08 NOTE — Telephone Encounter (Signed)
OK to give refill on Zolpidem same strength, same sig, #20 with 1 rf

## 2013-05-09 MED ORDER — ZOLPIDEM TARTRATE 10 MG PO TABS: 10.0000 mg | ORAL_TABLET | Freq: Every evening | ORAL | Status: DC | PRN

## 2013-05-09 NOTE — Telephone Encounter (Signed)
RX faxed

## 2013-05-27 ENCOUNTER — Other Ambulatory Visit: Payer: Self-pay | Admitting: Family Medicine

## 2013-05-28 ENCOUNTER — Emergency Department (HOSPITAL_BASED_OUTPATIENT_CLINIC_OR_DEPARTMENT_OTHER): Payer: 59

## 2013-05-28 ENCOUNTER — Encounter (HOSPITAL_BASED_OUTPATIENT_CLINIC_OR_DEPARTMENT_OTHER): Payer: Self-pay | Admitting: Emergency Medicine

## 2013-05-28 ENCOUNTER — Emergency Department (HOSPITAL_BASED_OUTPATIENT_CLINIC_OR_DEPARTMENT_OTHER)
Admission: EM | Admit: 2013-05-28 | Discharge: 2013-05-28 | Disposition: A | Discharge status: Home / Self Care | Payer: 59 | Attending: Emergency Medicine | Admitting: Emergency Medicine

## 2013-05-28 DIAGNOSIS — Z7982 Long term (current) use of aspirin: Secondary | ICD-10-CM | POA: Insufficient documentation

## 2013-05-28 DIAGNOSIS — Y92410 Unspecified street and highway as the place of occurrence of the external cause: Secondary | ICD-10-CM | POA: Insufficient documentation

## 2013-05-28 DIAGNOSIS — E785 Hyperlipidemia, unspecified: Secondary | ICD-10-CM | POA: Insufficient documentation

## 2013-05-28 DIAGNOSIS — I10 Essential (primary) hypertension: Secondary | ICD-10-CM | POA: Insufficient documentation

## 2013-05-28 DIAGNOSIS — G4700 Insomnia, unspecified: Secondary | ICD-10-CM | POA: Insufficient documentation

## 2013-05-28 DIAGNOSIS — Z8619 Personal history of other infectious and parasitic diseases: Secondary | ICD-10-CM | POA: Insufficient documentation

## 2013-05-28 DIAGNOSIS — E119 Type 2 diabetes mellitus without complications: Secondary | ICD-10-CM | POA: Insufficient documentation

## 2013-05-28 DIAGNOSIS — IMO0002 Reserved for concepts with insufficient information to code with codable children: Secondary | ICD-10-CM | POA: Insufficient documentation

## 2013-05-28 DIAGNOSIS — Y9389 Activity, other specified: Secondary | ICD-10-CM | POA: Insufficient documentation

## 2013-05-28 DIAGNOSIS — S79919A Unspecified injury of unspecified hip, initial encounter: Secondary | ICD-10-CM | POA: Insufficient documentation

## 2013-05-28 DIAGNOSIS — Z87442 Personal history of urinary calculi: Secondary | ICD-10-CM | POA: Insufficient documentation

## 2013-05-28 DIAGNOSIS — R000 Tachycardia, unspecified: Secondary | ICD-10-CM | POA: Insufficient documentation

## 2013-05-28 DIAGNOSIS — S0993XA Unspecified injury of face, initial encounter: Secondary | ICD-10-CM | POA: Insufficient documentation

## 2013-05-28 DIAGNOSIS — Z79899 Other long term (current) drug therapy: Secondary | ICD-10-CM | POA: Insufficient documentation

## 2013-05-28 DIAGNOSIS — Z85828 Personal history of other malignant neoplasm of skin: Secondary | ICD-10-CM | POA: Insufficient documentation

## 2013-05-28 LAB — BASIC METABOLIC PANEL
CO2: 22 mEq/L (ref 19–32)
Chloride: 102 mEq/L (ref 96–112)
Glucose, Bld: 216 mg/dL — ABNORMAL HIGH (ref 70–99)
Potassium: 3.6 mEq/L (ref 3.5–5.1)
Sodium: 136 mEq/L (ref 135–145)

## 2013-05-28 LAB — CBC
Hemoglobin: 14 g/dL (ref 13.0–17.0)
MCH: 30.2 pg (ref 26.0–34.0)
MCV: 84.5 fL (ref 78.0–100.0)
RBC: 4.64 MIL/uL (ref 4.22–5.81)

## 2013-05-28 MED ORDER — IBUPROFEN 200 MG PO TABS: 400.0000 mg | ORAL_TABLET | Freq: Three times a day (TID) | ORAL | Status: AC

## 2013-05-28 MED ORDER — FENTANYL CITRATE 0.05 MG/ML IJ SOLN
INTRAMUSCULAR | Status: AC
  Filled 2013-05-28: qty 2

## 2013-05-28 MED ORDER — DIAZEPAM 5 MG PO TABS
5.0000 mg | ORAL_TABLET | Freq: Once | ORAL | Status: AC
  Administered 2013-05-28: 5 mg via ORAL
  Filled 2013-05-28: qty 1

## 2013-05-28 MED ORDER — FENTANYL CITRATE 0.05 MG/ML IJ SOLN
50.0000 ug | Freq: Once | INTRAMUSCULAR | Status: AC
  Administered 2013-05-28: 50 ug via INTRAVENOUS
  Filled 2013-05-28: qty 2

## 2013-05-28 MED ORDER — DIAZEPAM 5 MG PO TABS: 5.0000 mg | ORAL_TABLET | Freq: Two times a day (BID) | ORAL | Status: AC

## 2013-05-28 MED ORDER — HYDROCODONE-ACETAMINOPHEN 5-325 MG PO TABS: 1.0000 | ORAL_TABLET | Freq: Three times a day (TID) | ORAL | Status: DC | PRN

## 2013-05-28 MED ORDER — FENTANYL CITRATE 0.05 MG/ML IJ SOLN
50.0000 ug | Freq: Once | INTRAMUSCULAR | Status: AC
  Administered 2013-05-28: 15:00:00 via INTRAVENOUS
  Filled 2013-05-28: qty 2
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
## 2013-05-28 NOTE — ED Notes (Signed)
Pt reports left shoulder pain, upper and lower back pain.
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
## 2013-05-28 NOTE — ED Notes (Signed)
MD at bedside. I along with EMT undress pt and removed. Back board. VS taken. Pt alert.

## 2013-06-02 ENCOUNTER — Encounter: Payer: Self-pay | Admitting: Family Medicine

## 2013-06-02 ENCOUNTER — Ambulatory Visit (INDEPENDENT_AMBULATORY_CARE_PROVIDER_SITE_OTHER): Payer: 59 | Admitting: Family Medicine

## 2013-06-02 VITALS — BP 122/92 | HR 80 | Temp 98.4°F | Ht 73.0 in | Wt 197.0 lb

## 2013-06-02 DIAGNOSIS — M542 Cervicalgia: Secondary | ICD-10-CM

## 2013-06-02 DIAGNOSIS — Z23 Encounter for immunization: Secondary | ICD-10-CM

## 2013-06-02 DIAGNOSIS — G4700 Insomnia, unspecified: Secondary | ICD-10-CM

## 2013-06-02 DIAGNOSIS — IMO0001 Reserved for inherently not codable concepts without codable children: Secondary | ICD-10-CM

## 2013-06-02 DIAGNOSIS — R030 Elevated blood-pressure reading, without diagnosis of hypertension: Secondary | ICD-10-CM

## 2013-06-02 DIAGNOSIS — R55 Syncope and collapse: Secondary | ICD-10-CM

## 2013-06-02 LAB — CBC
HCT: 40.7 % (ref 39.0–52.0)
Hemoglobin: 14.2 g/dL (ref 13.0–17.0)
MCH: 29.5 pg (ref 26.0–34.0)
MCHC: 34.9 g/dL (ref 30.0–36.0)
MCV: 84.6 fL (ref 78.0–100.0)
Platelets: 192 10*3/uL (ref 150–400)
RBC: 4.81 MIL/uL (ref 4.22–5.81)
RDW: 14.2 % (ref 11.5–15.5)
WBC: 6.5 10*3/uL (ref 4.0–10.5)

## 2013-06-02 MED ORDER — HYDROCODONE-ACETAMINOPHEN 7.5-325 MG PO TABS: 1.0000 | ORAL_TABLET | Freq: Four times a day (QID) | ORAL | Status: DC | PRN

## 2013-06-02 MED ORDER — METHYLPREDNISOLONE (PAK) 4 MG PO TABS: ORAL_TABLET | ORAL | Status: DC

## 2013-06-02 MED ORDER — METHOCARBAMOL 500 MG PO TABS: 500.0000 mg | ORAL_TABLET | Freq: Four times a day (QID) | ORAL | Status: DC | PRN
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
## 2013-06-03 LAB — RENAL FUNCTION PANEL
Albumin: 4.3 g/dL (ref 3.5–5.2)
BUN: 13 mg/dL (ref 6–23)
CO2: 29 mEq/L (ref 19–32)
Calcium: 9.6 mg/dL (ref 8.4–10.5)
Creat: 0.71 mg/dL (ref 0.50–1.35)
Glucose, Bld: 163 mg/dL — ABNORMAL HIGH (ref 70–99)
Potassium: 4.6 mEq/L (ref 3.5–5.3)

## 2013-06-03 LAB — HEPATIC FUNCTION PANEL
ALT: 14 U/L (ref 0–53)
AST: 13 U/L (ref 0–37)
Albumin: 4.3 g/dL (ref 3.5–5.2)
Bilirubin, Direct: 0.1 mg/dL (ref 0.0–0.3)
Indirect Bilirubin: 0.3 mg/dL (ref 0.0–0.9)
Total Protein: 7 g/dL (ref 6.0–8.3)

## 2013-06-04 ENCOUNTER — Encounter: Payer: Self-pay | Admitting: Family Medicine

## 2013-06-04 ENCOUNTER — Other Ambulatory Visit: Payer: Self-pay | Admitting: Family Medicine

## 2013-06-04 DIAGNOSIS — M542 Cervicalgia: Secondary | ICD-10-CM | POA: Insufficient documentation

## 2013-06-04 NOTE — Assessment & Plan Note (Addendum)
Worse secondary to pain, may use Ambien prn
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
## 2013-06-04 NOTE — Telephone Encounter (Signed)
Rx request to pharmacy/SLS  
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
## 2013-06-04 NOTE — Assessment & Plan Note (Signed)
Encouraged to minimize carbohydrates for the next few days while on steroids

## 2013-06-04 NOTE — Assessment & Plan Note (Signed)
Tolerable despite pain, no changes

## 2013-06-10 ENCOUNTER — Telehealth: Payer: Self-pay

## 2013-06-10 NOTE — Telephone Encounter (Signed)
FMLA paperwork faxed to [employer]

## 2013-07-01 ENCOUNTER — Ambulatory Visit: Payer: Self-pay | Admitting: Family Medicine

## 2013-07-01 ENCOUNTER — Telehealth: Payer: Self-pay | Admitting: Family Medicine

## 2013-07-01 ENCOUNTER — Ambulatory Visit (INDEPENDENT_AMBULATORY_CARE_PROVIDER_SITE_OTHER): Payer: 59 | Admitting: Family Medicine

## 2013-07-01 ENCOUNTER — Encounter: Payer: Self-pay | Admitting: Family Medicine

## 2013-07-01 VITALS — BP 138/90 | HR 82 | Temp 97.9°F | Ht 72.0 in | Wt 196.1 lb

## 2013-07-01 DIAGNOSIS — Z0000 Encounter for general adult medical examination without abnormal findings: Secondary | ICD-10-CM

## 2013-07-01 DIAGNOSIS — E785 Hyperlipidemia, unspecified: Secondary | ICD-10-CM

## 2013-07-01 DIAGNOSIS — M542 Cervicalgia: Secondary | ICD-10-CM

## 2013-07-01 DIAGNOSIS — R030 Elevated blood-pressure reading, without diagnosis of hypertension: Secondary | ICD-10-CM

## 2013-07-01 DIAGNOSIS — IMO0001 Reserved for inherently not codable concepts without codable children: Secondary | ICD-10-CM

## 2013-07-01 DIAGNOSIS — I10 Essential (primary) hypertension: Secondary | ICD-10-CM

## 2013-07-01 MED ORDER — LISINOPRIL 10 MG PO TABS: 10.0000 mg | ORAL_TABLET | Freq: Every day | ORAL | Status: DC

## 2013-07-01 NOTE — Telephone Encounter (Signed)
Albert [name] from the [facility] pharmacy left message on nurse voicemail stating that she needs the correct directions on patient's lisinopril. Patient was there after his appointment today and told the pharmacy that he thought he was supposed to take two lisinopril tablets daily now.
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
## 2013-07-01 NOTE — Assessment & Plan Note (Addendum)
Repeat BP up more. Increase Lisinopril to bid

## 2013-07-01 NOTE — Telephone Encounter (Signed)
Labs prior to next visit lipid, renal, cbc, tsh, hepatic, hgba1c   Patient has appointment on 09/12/13 and will be going to [place] [facility]

## 2013-07-01 NOTE — Progress Notes (Signed)
Pre visit review using our clinic review tool, if applicable. No additional management support is needed unless otherwise documented below in the visit note. 
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
## 2013-07-01 NOTE — Patient Instructions (Signed)

## 2013-07-02 MED ORDER — LISINOPRIL 10 MG PO TABS: 10.0000 mg | ORAL_TABLET | Freq: Two times a day (BID) | ORAL | Status: DC

## 2013-07-02 NOTE — Telephone Encounter (Signed)
Please advise 

## 2013-07-02 NOTE — Telephone Encounter (Signed)
Lab order placed.

## 2013-07-02 NOTE — Telephone Encounter (Signed)
He is right I need the sig changed to bid for the LIsinopril please send it down

## 2013-07-03 ENCOUNTER — Other Ambulatory Visit: Payer: Self-pay | Admitting: Family Medicine

## 2013-07-06 NOTE — Assessment & Plan Note (Signed)
Tolerating Januvia, avoid simple carbs

## 2013-07-06 NOTE — Assessment & Plan Note (Signed)
Tolerating Atorvastatin, avoid simple carbs.

## 2013-08-04 ENCOUNTER — Other Ambulatory Visit: Payer: Self-pay | Admitting: Family

## 2013-08-04 NOTE — Telephone Encounter (Signed)
Rx request to pharmacy/SLS  

## 2013-08-06 ENCOUNTER — Telehealth: Payer: Self-pay | Admitting: Family Medicine

## 2013-08-06 DIAGNOSIS — G4700 Insomnia, unspecified: Secondary | ICD-10-CM

## 2013-08-06 NOTE — Telephone Encounter (Signed)
refill-zolpidem tartrate 10mg  tab. Take one tablet by mouth at bedtime as needed for sleep. Qty 20 last fill 11.11.14

## 2013-08-08 MED ORDER — ZOLPIDEM TARTRATE 10 MG PO TABS: 10.0000 mg | ORAL_TABLET | Freq: Every evening | ORAL | Status: DC | PRN

## 2013-08-08 NOTE — Telephone Encounter (Signed)
Please advise refill?  Last RX was done on 05-09-13 quantity 20 with 1 refill  If ok fax to [phone]

## 2013-08-08 NOTE — Telephone Encounter (Signed)
RX faxed

## 2013-08-08 NOTE — Telephone Encounter (Signed)
OK to refill Ambien with same strength, same sig, 1 rf

## 2013-08-16 ENCOUNTER — Emergency Department (HOSPITAL_COMMUNITY)
Admission: EM | Admit: 2013-08-16 | Discharge: 2013-08-17 | Disposition: A | Discharge status: Home / Self Care | Payer: PRIVATE HEALTH INSURANCE | Attending: Emergency Medicine | Admitting: Emergency Medicine

## 2013-08-16 ENCOUNTER — Encounter (HOSPITAL_COMMUNITY): Payer: Self-pay | Admitting: Emergency Medicine

## 2013-08-16 DIAGNOSIS — Z85828 Personal history of other malignant neoplasm of skin: Secondary | ICD-10-CM | POA: Insufficient documentation

## 2013-08-16 DIAGNOSIS — Z79899 Other long term (current) drug therapy: Secondary | ICD-10-CM | POA: Insufficient documentation

## 2013-08-16 DIAGNOSIS — I10 Essential (primary) hypertension: Secondary | ICD-10-CM | POA: Insufficient documentation

## 2013-08-16 DIAGNOSIS — S63509A Unspecified sprain of unspecified wrist, initial encounter: Secondary | ICD-10-CM | POA: Insufficient documentation

## 2013-08-16 DIAGNOSIS — Z8709 Personal history of other diseases of the respiratory system: Secondary | ICD-10-CM | POA: Insufficient documentation

## 2013-08-16 DIAGNOSIS — Y9389 Activity, other specified: Secondary | ICD-10-CM | POA: Insufficient documentation

## 2013-08-16 DIAGNOSIS — Z8619 Personal history of other infectious and parasitic diseases: Secondary | ICD-10-CM | POA: Insufficient documentation

## 2013-08-16 DIAGNOSIS — S63502A Unspecified sprain of left wrist, initial encounter: Secondary | ICD-10-CM

## 2013-08-16 DIAGNOSIS — Z87442 Personal history of urinary calculi: Secondary | ICD-10-CM | POA: Insufficient documentation

## 2013-08-16 DIAGNOSIS — E785 Hyperlipidemia, unspecified: Secondary | ICD-10-CM | POA: Insufficient documentation

## 2013-08-16 DIAGNOSIS — Z7982 Long term (current) use of aspirin: Secondary | ICD-10-CM | POA: Insufficient documentation

## 2013-08-16 DIAGNOSIS — X500XXA Overexertion from strenuous movement or load, initial encounter: Secondary | ICD-10-CM | POA: Insufficient documentation

## 2013-08-16 DIAGNOSIS — G4700 Insomnia, unspecified: Secondary | ICD-10-CM | POA: Insufficient documentation

## 2013-08-16 DIAGNOSIS — Z9889 Other specified postprocedural states: Secondary | ICD-10-CM | POA: Insufficient documentation

## 2013-08-16 DIAGNOSIS — Z8739 Personal history of other diseases of the musculoskeletal system and connective tissue: Secondary | ICD-10-CM | POA: Insufficient documentation

## 2013-08-16 DIAGNOSIS — E119 Type 2 diabetes mellitus without complications: Secondary | ICD-10-CM | POA: Insufficient documentation

## 2013-08-16 DIAGNOSIS — Y929 Unspecified place or not applicable: Secondary | ICD-10-CM | POA: Insufficient documentation
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
## 2013-08-17 ENCOUNTER — Emergency Department (HOSPITAL_COMMUNITY): Payer: PRIVATE HEALTH INSURANCE

## 2013-08-17 MED ORDER — IBUPROFEN 400 MG PO TABS
800.0000 mg | ORAL_TABLET | Freq: Once | ORAL | Status: AC
  Administered 2013-08-17: 800 mg via ORAL
  Filled 2013-08-17: qty 2

## 2013-08-17 NOTE — Discharge Instructions (Signed)
Please call your doctor for a followup appointment within 24-48 hours. When you talk to your doctor please let them know that you were seen in the emergency department and have them acquire all of your records so that they can discuss the findings with you and formulate a treatment plan to fully care for your new and ongoing problems. Please call and set-up an appointment with Dr. Apolonio Schneiders, hand surgeon, regarding left wrist pain Please keep wrist in brace at all times Please avoid any heavy lifting Please take Ibuprofen as needed - please take on a full stomach  Please continue to monitor symptoms and if symptoms are to worsen or change (fever, chills, numbness, tingling, fall, swelling, redness, warmth upon palpation, loss of sensation, reinjury, worsening or changes to pain pattern) please report back to emergency apartment immediately  Wrist Sprain with Rehab A sprain is an injury in which a ligament that maintains the proper alignment of a joint is partially or completely torn. The ligaments of the wrist are susceptible to sprains. Sprains are classified into three categories. Grade 1 sprains cause pain, but the tendon is not lengthened. Grade 2 sprains include a lengthened ligament because the ligament is stretched or partially ruptured. With grade 2 sprains there is still function, although the function may be diminished. Grade 3 sprains are characterized by a complete tear of the tendon or muscle, and function is usually impaired. SYMPTOMS   Pain tenderness, inflammation, and/or bruising (contusion) of the injury.  A "pop" or tear felt and/or heard at the time of injury.  Decreased wrist function. CAUSES  A wrist sprain occurs when a force is placed on one or more ligaments that is greater than it/they can withstand. Common mechanisms of injury include:  Catching a ball with you hands.  Repetitive and/ or strenuous extension or flexion of the wrist. RISK INCREASES WITH:  Previous wrist  injury.  Contact sports (boxing or wrestling).  Activities in which falling is common.  Poor strength and flexibility.  Improperly fitted or padded protective equipment. PREVENTION  Warm up and stretch properly before activity.  Allow for adequate recovery between workouts.  Maintain physical fitness:  Strength, flexibility, and endurance.  Cardiovascular fitness.  Protect the wrist joint by limiting its motion with the use of taping, braces, or splints.  Protect the wrist after injury for 6 to 12 months. PROGNOSIS  The prognosis for wrist sprains depends on the degree of injury. Grade 1 sprains require 2 to 6 weeks of treatment. Grade 2 sprains require 6 to 8 weeks of treatment, and grade 3 sprains require up to 12 weeks.  RELATED COMPLICATIONS   Prolonged healing time, if improperly treated or re-injured.  Recurrent symptoms that result in a chronic problem.  Injury to nearby structures (bone, cartilage, nerves, or tendons).  Arthritis of the wrist.  Inability to compete in athletics at a high level.  Wrist stiffness or weakness.  Progression to a complete rupture of the ligament. TREATMENT  Treatment initially involves resting from any activities that aggravate the symptoms, and the use of ice and medications to help reduce pain and inflammation. Your caregiver may recommend immobilizing the wrist for a period of time in order to reduce stress on the ligament and allow for healing. After immobilization it is important to perform strengthening and stretching exercises to help regain strength and a full range of motion. These exercises may be completed at home or with a therapist. Surgery is not usually required for wrist sprains, unless  the ligament has been ruptured (grade 3 sprain). MEDICATION   If pain medication is necessary, then nonsteroidal anti-inflammatory medications, such as aspirin and ibuprofen, or other minor pain relievers, such as acetaminophen, are  often recommended.  Do not take pain medication for 7 days before surgery.  Prescription pain relievers may be given if deemed necessary by your caregiver. Use only as directed and only as much as you need. HEAT AND COLD  Cold treatment (icing) relieves pain and reduces inflammation. Cold treatment should be applied for 10 to 15 minutes every 2 to 3 hours for inflammation and pain and immediately after any activity that aggravates your symptoms. Use ice packs or massage the area with a piece of ice (ice massage).  Heat treatment may be used prior to performing the stretching and strengthening activities prescribed by your caregiver, physical therapist, or athletic trainer. Use a heat pack or soak your injury in warm water. SEEK MEDICAL CARE IF:  Treatment seems to offer no benefit, or the condition worsens.  Any medications produce adverse side effects. EXERCISES RANGE OF MOTION (ROM) AND STRETCHING EXERCISES - Wrist Sprain  These exercises may help you when beginning to rehabilitate your injury. Your symptoms may resolve with or without further involvement from your physician, physical therapist or athletic trainer. While completing these exercises, remember:   Restoring tissue flexibility helps normal motion to return to the joints. This allows healthier, less painful movement and activity.  An effective stretch should be held for at least 30 seconds.  A stretch should never be painful. You should only feel a gentle lengthening or release in the stretched tissue. RANGE OF MOTION  Wrist Flexion, Active-Assisted  Extend your right / left elbow with your fingers pointing down.*  Gently pull the back of your hand towards you until you feel a gentle stretch on the top of your forearm.  Hold this position for __________ seconds. Repeat __________ times. Complete this exercise __________ times per day.  *If directed by your physician, physical therapist or athletic trainer, complete this  stretch with your elbow bent rather than extended. RANGE OF MOTION  Wrist Extension, Active-Assisted  Extend your right / left elbow and turn your palm upwards.*  Gently pull your palm/fingertips back so your wrist extends and your fingers point more toward the ground.  You should feel a gentle stretch on the inside of your forearm.  Hold this position for __________ seconds. Repeat __________ times. Complete this exercise __________ times per day. *If directed by your physician, physical therapist or athletic trainer, complete this stretch with your elbow bent, rather than extended. RANGE OF MOTION  Supination, Active  Stand or sit with your elbows at your side. Bend your right / left elbow to 90 degrees.  Turn your palm upward until you feel a gentle stretch on the inside of your forearm.  Hold this position for __________ seconds. Slowly release and return to the starting position. Repeat __________ times. Complete this stretch __________ times per day.  RANGE OF MOTION  Pronation, Active  Stand or sit with your elbows at your side. Bend your right / left elbow to 90 degrees.  Turn your palm downward until you feel a gentle stretch on the top of your forearm.  Hold this position for __________ seconds. Slowly release and return to the starting position. Repeat __________ times. Complete this stretch __________ times per day.  STRETCH - Wrist Flexion  Place the back of your right / left hand on a tabletop  leaving your elbow slightly bent. Your fingers should point away from your body.  Gently press the back of your hand down onto the table by straightening your elbow. You should feel a stretch on the top of your forearm.  Hold this position for __________ seconds. Repeat __________ times. Complete this stretch __________ times per day.  STRETCH  Wrist Extension  Place your right / left fingertips on a tabletop leaving your elbow slightly bent. Your fingers should point  backwards.  Gently press your fingers and palm down onto the table by straightening your elbow. You should feel a stretch on the inside of your forearm.  Hold this position for __________ seconds. Repeat __________ times. Complete this stretch __________ times per day.  STRENGTHENING EXERCISES - Wrist Sprain These exercises may help you when beginning to rehabilitate your injury. They may resolve your symptoms with or without further involvement from your physician, physical therapist or athletic trainer. While completing these exercises, remember:   Muscles can gain both the endurance and the strength needed for everyday activities through controlled exercises.  Complete these exercises as instructed by your physician, physical therapist or athletic trainer. Progress with the resistance and repetition exercises only as your caregiver advises. STRENGTH Wrist Flexors  Sit with your right / left forearm palm-up and fully supported. Your elbow should be resting below the height of your shoulder. Allow your wrist to extend over the edge of the surface.  Loosely holding a __________ weight or a piece of rubber exercise band/tubing, slowly curl your hand up toward your forearm.  Hold this position for __________ seconds. Slowly lower the wrist back to the starting position in a controlled manner. Repeat __________ times. Complete this exercise __________ times per day.  STRENGTH  Wrist Extensors  Sit with your right / left forearm palm-down and fully supported. Your elbow should be resting below the height of your shoulder. Allow your wrist to extend over the edge of the surface.  Loosely holding a __________ weight or a piece of rubber exercise band/tubing, slowly curl your hand up toward your forearm.  Hold this position for __________ seconds. Slowly lower the wrist back to the starting position in a controlled manner. Repeat __________ times. Complete this exercise __________ times per day.    STRENGTH - Ulnar Deviators  Stand with a ____________________ weight in your right / left hand, or sit holding on to the rubber exercise band/tubing with your opposite arm supported.  Move your wrist so that your pinkie travels toward your forearm and your thumb moves away from your forearm.  Hold this position for __________ seconds and then slowly lower the wrist back to the starting position. Repeat __________ times. Complete this exercise __________ times per day STRENGTH - Radial Deviators  Stand with a ____________________ weight in your  right / left hand, or sit holding on to the rubber exercise band/tubing with your arm supported.  Raise your hand upward in front of you or pull up on the rubber tubing.  Hold this position for __________ seconds and then slowly lower the wrist back to the starting position. Repeat __________ times. Complete this exercise __________ times per day. STRENGTH  Forearm Supinators  Sit with your right / left forearm supported on a table, keeping your elbow below shoulder height. Rest your hand over the edge, palm down.  Gently grip a hammer or a soup ladle.  Without moving your elbow, slowly turn your palm and hand upward to a "thumbs-up" position.  Hold  this position for __________ seconds. Slowly return to the starting position. Repeat __________ times. Complete this exercise __________ times per day.  STRENGTH  Forearm Pronators  Sit with your right / left forearm supported on a table, keeping your elbow below shoulder height. Rest your hand over the edge, palm up.  Gently grip a hammer or a soup ladle.  Without moving your elbow, slowly turn your palm and hand upward to a "thumbs-up" position.  Hold this position for __________ seconds. Slowly return to the starting position. Repeat __________ times. Complete this exercise __________ times per day.  STRENGTH - Grip  Grasp a tennis ball, a dense sponge, or a large, rolled sock in your  hand.  Squeeze as hard as you can without increasing any pain.  Hold this position for __________ seconds. Release your grip slowly. Repeat __________ times. Complete this exercise __________ times per day.  Document Released: 07/31/2005 Document Revised: 10/23/2011 Document Reviewed: 11/12/2008 [facility] Patient Information 2014 [place], [place].   Emergency Department Resource Guide 1) Find a Doctor and Pay Out of Pocket Although you won't have to find out who is covered by your insurance plan, it is a good idea to ask around and get recommendations. You will then need to call the office and see if the doctor you have chosen will accept you as a new patient and what types of options they offer for patients who are self-pay. Some doctors offer discounts or will set up payment plans for their patients who do not have insurance, but you will need to ask so you aren't surprised when you get to your appointment.  2) Contact Your Local Health Department Not all health departments have doctors that can see patients for sick visits, but many do, so it is worth a call to see if yours does. If you don't know where your local health department is, you can check in your phone book. The CDC also has a tool to help you locate your state's health department, and many state websites also have listings of all of their local health departments.  3) Find a [facility] If your illness is not likely to be very severe or complicated, you may want to try a walk in clinic. These are popping up all over the country in pharmacies, drugstores, and shopping centers. They're usually staffed by nurse practitioners or physician assistants that have been trained to treat common illnesses and complaints. They're usually fairly quick and inexpensive. However, if you have serious medical issues or chronic medical problems, these are probably not your best option.  No Primary Care Doctor: - Call Health Connect at  [phone] -  they can help you locate a primary care doctor that  accepts your insurance, provides certain services, etc. - Physician Referral Service- [phone]  Chronic Pain Problems: Organization         Address  Phone   Notes  [facility]  [phone] Patients need to be referred by their primary care doctor.   Medication Assistance: Organization         Address  Phone   Notes  [facility] Medication [facility] [address] [place] [place], [place] [address] [phone] --Must be a resident of [facility] -- Must have NO insurance coverage whatsoever (no Medicaid/ Medicare, etc.) -- The pt. MUST have a primary care doctor that directs their care regularly and follows them in the community   MedAssist  [phone]   [address]  (  [phone]    Agencies that provide inexpensive medical care: Organization         Address  Phone   Notes  [place]  [phone]   [facility]    [phone]   [facility] [place], [place] [address] [phone]   [place] [address], [place] [phone]   Planned Parenthood    [phone]   [facility]    [phone]   [place] [address], [place] Phone:  [phone], Fax:  [phone] Hours of Operation:  9 am - 6 pm, M-F.  Also accepts Medicaid/Medicare and self-pay.  [facility] for [place], [address], [place] Phone: [phone], Fax: [phone]. Hours of Operation:  8:30 am - 5:30 pm, M-F.  Also accepts Medicaid and self-pay.  [facility] [address] [place] Phone: [phone]   [place], [place], [place] [phone], Ext. [phone] Mondays & Thursdays: 7-9 AM.  First 15 patients are seen on a first come, first serve basis.    [place] Providers:  Organization         Address  Phone   Notes  [facility] [address],  [phone] Also accepts self-pay patients.  [facility] [address], [place]  [phone]   [place], [address], [place] [phone]   [facility] [address] [place] [phone]   Lucianne Lei [address], [place]   [phone] Only accepts [place] Access [place] patients after they have their name applied to their card.   Self-Pay (no insurance) in [facility]:  Organization         Address  Phone   Notes  Sickle Cell Patients, [facility] [place] [phone]   [facility] [place] [phone]   [facility] [place]  [place], [place], [place] [phone]   [facility]/Dr. Osei-Bonsu  [address], [place] or [address], [place] [phone] Phone number for both [place] and [place] locations is the same.  Urgent Medical and [facility] [address] [place] [phone]   [facility] [address] [place] or [address] [phone]   [facility] [address] [place] [phone], phone; [phone], fax Sees patients 1st and 3rd Saturday of every month.  Must not qualify for public or private insurance (i.e. Medicaid, Medicare, [place] Health Choice, Veterans' Benefits)  Household income should be no more than 200% of the poverty level The clinic cannot treat you if you are pregnant or think you are pregnant  Sexually transmitted diseases are not treated at the clinic.    Dental Care: Organization         Address  Phone  Notes  [facility] Department of [facility] [place] [phone] Accepts children up to age 66 who are enrolled in [place] or [place]; pregnant women with a Medicaid card; and children who  have applied for Medicaid or [place] Health Choice, but were declined, whose parents can pay a reduced fee at time of service.  [facility] Department of [facility]  [address], [place] [phone] Accepts children up to age 76 who are enrolled in [place] or [place]; pregnant women with a Medicaid card; and children who have applied for Medicaid or  Health Choice, but were declined, whose parents can pay a reduced fee at time of service.  [facility] Adult [facility] Access PRO[facility]  [place] [phone] Patients are seen by appointment only. Walk-ins are not accepted. [place] will see patients 88 years of age and older. Monday - Tuesday (8am-5pm) Most Wednesdays (8:30-5pm) $30 per visit, cash only  [facility] Adult [facility] Access PROGRAM  [address], [facility] [phone] Patients are seen by appointment only. Walk-ins are not accepted. [place] will see patients 41 years of age and older. One Wednesday Evening (Monthly: Volunteer Based).  $30 per visit, cash only  [place]  [phone] for adults; Children under age 63, call Graduate Pediatric Dentistry at [phone]. Children aged 50-14, please call [phone] to request a pediatric application.  Dental services are provided in all areas of dental care including fillings, crowns and bridges, complete and partial dentures, implants, gum treatment, root canals, and extractions. Preventive care is also provided. Treatment is provided to both adults and children. Patients are selected via a lottery and there is often a waiting list.   [facility] [address] [place]  [phone] [email or web address]   Rescue  [facility] [address] [place], [place] [phone], Ext. [phone] Second and Fourth Thursday of each month, opens at 6:30 AM; Clinic ends at 9 AM.  Patients are seen on a first-come first-served basis, and a limited number are seen during each clinic.   [facility]  [address] Hillard Danker [place], [place] [phone]   Eligibility Requirements You must have lived in [place], [place], or [place] counties for at least the last three months.   You cannot be eligible for state or federal sponsored [employer], including [employer], [place], or [employer].   You generally cannot be eligible for healthcare insurance through your employer.    How to apply: Eligibility screenings are held every Tuesday and Wednesday afternoon from 1:00 pm until 4:00 pm. You do not need an appointment for the interview!  [facility] [address], [place], [place]   [place]  [place] Department  [place]  [phone]    Behavioral Health Resources in the Community: Intensive Outpatient Programs Organization         Address  Phone  Notes  [place]. [address], [place], [place] [phone]   [facility] Outpatient [address], [place], [place]   ADS: [facility] [address] [place], [place]   [place] [address]  [place], [place] or [phone]   Substance Abuse Resources Organization         Address  Phone  Notes  Alcohol and Drug Services  [place]  [phone]   [facility] [place]   Chinita Pester  [phone]   Residential & Outpatient Substance  Abuse Program  9515162152   Psychological Services Organization         Address  Phone  Notes  [place]   [place]  [phone]   [place] [address] [place] or [phone]    Mobile Crisis Teams Organization         Address  Phone  Notes  [facility], Mobile Crisis Care Unit  [phone]   [facility]  [address] [place], [place]   [place] [address], [place] [phone]    Self-Help/Support Groups Organization         Address  Phone             Notes  [place]. of [place] - variety of support groups  [place] Call for more information  Narcotics Anonymous (NA), [facility] [address] [place] [address], [employer] [place]  2 meetings at this location   Special educational needs teacher         Address  Phone  Notes  [facility]idential Treatment [place],    [place]  [phone]   [facility]  [address] [place] [id number], [place], [place]   [place], [place] [phone] Admissions: 8am-3pm M-F  Incentives Substance [place] [address]    [place], [place] [phone]   [facility] [address] [place], [place], [place]   [facility] [address],  [place], [place]   [facility] - Intensive Outpatient [address], [place], [place]   [facility] [place] ([place] [place].,  [place], [place] [phone] or [phone]   [facility] ([facility]) [address] [place], [place] Accepts Medicaid  [facility] [place] [address]  [place] [phone] Substance Abuse/Addiction Treatment   [facility] Organization         Address  Phone  Notes  [facility]  [phone]   Domenic Schwab, PhD [address] Arlis Porta [place], [place]   [phone] or  [phone]   [place], [place] [phone]   [facility] [phone] [place], [place] [phone] Insurance/Medicaid/sponsorship through [facility] and Families [address]                                    [place], [place] [phone] [place] [address] [place], [place] [phone]    Dr. Adele Schilder  [phone]   [facility] Dept. 1) [address] [place] 2) [place] 3)  [address], [place] [phone]  [phone]   [place] [phone] or [phone] (After Hours)

## 2013-08-17 NOTE — ED Provider Notes (Signed)
CSN: [id number]     Arrival date & time 08/16/13  2325 History   First MD Initiated Contact with Patient 08/16/13 2338     Chief Complaint  Patient presents with  . Wrist Pain   (Consider location/radiation/quality/duration/timing/severity/associated sxs/prior Treatment) Patient is a 58 y.o. male presenting with wrist pain. The history is provided by the patient. No language interpreter was used.  Wrist Pain Associated symptoms include arthralgias (Left wrist pain). Pertinent negatives include no numbness or weakness.  CHAY MAZZONI is a 58 year old male with past medical history of diabetes, hypertension, abdominal pain, diarrhea, neck pain presenting to emergency department with left wrist pain that started this afternoon. 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 Reported that he's been having constant stabbing pain, rated 8/10 to the left wrist without radiation. Reports he has mild tingling sensation to the fingers-pins and needle sensation. Denied loss of sensation, previous history of injury, numbness, weakness. PCP Dr. Randel Pigg  Past Medical History  Diagnosis Date  . Diabetes mellitus   . Hypertension   . History of chicken pox     childhood  . Seasonal allergies   . History of kidney stones   . Hyperlipidemia   . 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 Sciatica of right side 12/22/2012  . Insomnia 02/25/2013  . Abdominal pain, unspecified site 03/11/2013    Left side at site of excision   . Diarrhea 03/11/2013  . Acute neck pain 06/04/2013   Past Surgical History  Procedure Laterality Date  . Hernia repair    . Abdominal hernia repair    . Rotator cuff repair    . Cholecystectomy    . Mass excision  05/22/2012    Procedure: EXCISION MASS;  Surgeon: Joyice Faster. Cornett, MD;  Location: [facility];  Service: General;  Laterality: Left;  excision abdominal wall  mass   Family History  Problem Relation Age of Onset  . Lung cancer Mother   . Breast cancer Neg Hx   . Colon cancer Neg Hx   . Prostate cancer Neg Hx   . Heart disease Neg Hx   . Diabetes Maternal Aunt   . Cancer Paternal Grandmother    History  Substance Use Topics  . Smoking status: Never Smoker   . Smokeless tobacco: Never Used  . Alcohol Use: No    Review of Systems  Musculoskeletal: Positive for arthralgias (Left wrist pain).  Neurological: Negative for weakness and numbness.  All other systems reviewed and are negative.    Allergies  Contrast media and Iodine  Home Medications   Current Outpatient Rx  Name  Route  Sig  Dispense  Refill  . aspirin EC 81 MG tablet   Oral   Take 81 mg by mouth daily.         Marland Kitchen atorvastatin (LIPITOR) 20 MG tablet      TAKE 1 TABLET BY MOUTH DAILY.   30 tablet   6   . glucose blood (ONE TOUCH ULTRA TEST) test strip      Use as instructed   100 each   2     Use to test blood sugar three times daily. Dx 790. ...   . JANUVIA 100 MG tablet   Oral   Take 1 tablet (100 mg total) by mouth daily.   30 tablet  1     Dispense as written.   Marland Kitchen lisinopril (PRINIVIL,ZESTRIL) 10 MG tablet   Oral   Take 1 tablet (10 mg total) by mouth 2 (two) times daily.   180 tablet   1   . metFORMIN (GLUCOPHAGE-XR) 500 MG 24 hr tablet      TAKE 2 TABLETS BY MOUTH DAILY WITH BREAKFAST. START WITH 2 TABS THEN INCREASE TO 4 AS TOLERATED   60 tablet   5   . methocarbamol (ROBAXIN) 500 MG tablet   Oral   Take 1 tablet (500 mg total) by mouth 4 (four) times daily as needed.   120 tablet   1   . VIAGRA 100 MG tablet      TAKE 1/2 TO 1 TABLET BY MOUTH DAILY AS NEEDED FOR ERECTILE DYSFUNCTION   9 tablet   2     Dispense as written.   . zolpidem (AMBIEN) 10 MG tablet   Oral   Take 1 tablet (10 mg total) by mouth at bedtime as needed for sleep.   20 tablet   1    BP 147/97  Pulse 104  Temp(Src) 97.9 F (36.6 C) (Oral)  Resp  14  Ht 6' (1.829 m)  Wt 189 lb (85.73 kg)  BMI 25.63 kg/m2  SpO2 100% Physical Exam  Nursing note and vitals reviewed. Constitutional: He is oriented to person, place, and time. He appears well-developed and well-nourished. No distress.  HENT:  Head: Normocephalic and atraumatic.  Eyes: Conjunctivae are normal.  Neck: Normal range of motion. Neck supple.  Cardiovascular: Normal rate, regular rhythm and normal heart sounds.  Exam reveals no friction rub.   No murmur heard. Pulses:      Radial pulses are 2+ on the right side, and 2+ on the left side.  Pulmonary/Chest: Effort normal and breath sounds normal. No respiratory distress. He has no wheezes. He has no rales.  Musculoskeletal: Normal range of motion. He exhibits tenderness.  Negative swelling, erythema, inflammation, lesions, sores, deformities noted to left wrist in left hand. Discomfort upon palpation to the ulnar aspect of the left wrist. Negative snuffbox tenderness. Full range of motion to the left wrist-flexion, extension, ulnar deviation, radial deviation. Full range of motion to the digits of the left hand without difficulty. Full range of motion to left elbow without difficulty.  Neurological: He is alert and oriented to person, place, and time. He exhibits normal muscle tone. Coordination normal.  Strength 5+/5+ to upper extremities bilaterally with resistance applied, equal distribution noted Strength intact to MCP, PIP, DIP joints of the left hand Sensation intact with differentiation to sharp and dull touch to upper extremities bilaterally   Skin: Skin is warm and dry. No rash noted. He is not diaphoretic. No erythema.  Psychiatric: He has a normal mood and affect. His behavior is normal. Thought content normal.    ED Course  Procedures (including critical care time)  Dg Wrist Complete Left  08/17/2013   CLINICAL DATA:  Left wrist injury and pain.  EXAM: LEFT WRIST - COMPLETE 3+ VIEW  COMPARISON:  None.  FINDINGS:  There is no evidence of fracture or dislocation. There is no evidence of arthropathy or other focal bone abnormality. Soft tissues are unremarkable.  IMPRESSION: Negative.   Electronically Signed   By: Earle Gell M.D.   On: 08/17/2013 00:42   Dg Hand Complete Left  08/17/2013   CLINICAL DATA:  Left hand injury and pain.  EXAM: LEFT HAND - COMPLETE 3+  VIEW  COMPARISON:  None.  FINDINGS: There is no evidence of fracture or dislocation. There is no evidence of arthropathy or other focal bone abnormality. Soft tissues are unremarkable.  IMPRESSION: Negative.   Electronically Signed   By: Earle Gell M.D.   On: 08/17/2013 00:42   Labs Review Labs Reviewed - No data to display Imaging Review Dg Wrist Complete Left  08/17/2013   CLINICAL DATA:  Left wrist injury and pain.  EXAM: LEFT WRIST - COMPLETE 3+ VIEW  COMPARISON:  None.  FINDINGS: There is no evidence of fracture or dislocation. There is no evidence of arthropathy or other focal bone abnormality. Soft tissues are unremarkable.  IMPRESSION: Negative.   Electronically Signed   By: Earle Gell M.D.   On: 08/17/2013 00:42   Dg Hand Complete Left  08/17/2013   CLINICAL DATA:  Left hand injury and pain.  EXAM: LEFT HAND - COMPLETE 3+ VIEW  COMPARISON:  None.  FINDINGS: There is no evidence of fracture or dislocation. There is no evidence of arthropathy or other focal bone abnormality. Soft tissues are unremarkable.  IMPRESSION: Negative.   Electronically Signed   By: Earle Gell M.D.   On: 08/17/2013 00:42    EKG Interpretation   None       MDM   1. Wrist sprain, left, initial encounter    Medications  ibuprofen (ADVIL,MOTRIN) tablet 800 mg (800 mg Oral Given 08/17/13 0128)   Filed Vitals:   08/16/13 2332 08/16/13 2333 08/17/13 0131  BP:  139/91 147/97  Pulse:  97 104  Temp:  97.9 F (36.6 C)   TempSrc:  Oral   Resp:  14   Height: 6' (1.829 m)    Weight: 189 lb (85.73 kg)    SpO2:  98% 100%    Patient percent in to emergency  Department left wrist pain that has been ongoing since his evening. Patient reports he was moving a body from the more onto an SUV when he noticed a twisted his left wrist at all manner leading to a popping sensation. Reports he's been experiencing sharp shooting pain, stabbing pain to the left wrist without radiation. Reported that he's been having tingling sensations to the fingers. Alert and oriented. GCS 15. Heart rate and rhythm normal. Pulses palpable and strong, radial 2+ bilaterally. Lungs clear to auscultation. Negative swelling, deformities, inflammation, lesions, ecchymosis noted to the left wrist. Negative snuffbox tenderness. Discomfort upon palpation to the ulnar aspect of the left wrist. Full range of motion to the digits. Full range of motion to the wrist without difficulty-flexion, extension, ulnar deviation, radial deviation. Strength intact to MCP, PIP, DIP joints of the left hand. Full range of motion to left elbow. Sensation intact. Plain film of left wrist and hand negative for acute abnormalities. Patient neurovascularly intact. Patient placed in brace.  Discharge patient. Referred patient to primary care provider and hand surgeon. Recommended patient to rest, ice, elevate. Discussed with patient to avoid physical strenuous activity. Discussed with patient to closely monitor symptoms and if symptoms are to worsen or change to report back to the ED - strict return instructions given.  Patient agreed to plan of care, understood, all questions answered.      Jamse Mead, PA-C 08/17/13 1700

## 2013-08-17 NOTE — ED Notes (Signed)
Pt returned from radiology.

## 2013-08-18 NOTE — ED Provider Notes (Signed)
Medical screening examination/treatment/procedure(s) were performed by non-physician practitioner and as supervising physician I was immediately available for consultation/collaboration.  EKG Interpretation   None         Julianne Rice, MD 08/18/13 [phone]

## 2013-08-19 ENCOUNTER — Other Ambulatory Visit: Payer: Self-pay | Admitting: Family Medicine

## 2013-08-20 NOTE — Telephone Encounter (Signed)
Patient called to advise he takes 4 tablets of metformin a day  2 in the am and 2 in the pm  States he has been doing that for quite some time

## 2013-08-20 NOTE — Telephone Encounter (Signed)
Left detailed message on pt's cell# to call and let us know if he is still taking 2 tablets a day or has he been able to increase dose as directed below?     Medication name:  Name from pharmacy:  metFORMIN (GLUCOPHAGE-XR) 500 MG 24 hr tablet  METFORMIN HCL ER 500 MG TAB 500 MG TAB Sig: TAKE 2 TABLETS BY MOUTH DAILY WITH BREAKFAST. START WITH 2 TABLETS THEN INCREASE TO 4 TABLETS DAILY AS TOLERATED Dispense: 60 tablet Refills: 5 Start: 08/19/2013 Class: Normal Requested on: 05/27/2013 Originally ordered on: 09/10/2012 Last refill: 05/27/2013

## 2013-08-21 ENCOUNTER — Encounter: Payer: Self-pay | Admitting: Physician Assistant

## 2013-08-21 ENCOUNTER — Ambulatory Visit (INDEPENDENT_AMBULATORY_CARE_PROVIDER_SITE_OTHER): Payer: 59 | Admitting: Physician Assistant

## 2013-08-21 VITALS — BP 140/100 | HR 87 | Temp 97.6°F | Resp 16 | Ht 72.0 in | Wt 200.5 lb

## 2013-08-21 DIAGNOSIS — S63509A Unspecified sprain of unspecified wrist, initial encounter: Secondary | ICD-10-CM | POA: Insufficient documentation

## 2013-08-21 DIAGNOSIS — Z5189 Encounter for other specified aftercare: Secondary | ICD-10-CM

## 2013-08-21 DIAGNOSIS — S63502D Unspecified sprain of left wrist, subsequent encounter: Secondary | ICD-10-CM

## 2013-08-21 MED ORDER — TRAMADOL HCL 50 MG PO TABS: 50.0000 mg | ORAL_TABLET | Freq: Three times a day (TID) | ORAL | Status: DC | PRN

## 2013-08-21 NOTE — Patient Instructions (Signed)
Please continue care as before -- Wear wrist splint.  Alternate Ice packs and Moist Heat.  Apply a topical aspercreme or Tiger balm to affected area.  Avoid heavy lifting and overuse of left wrist.  Call if symptoms not improving over the next week.  Wrist Pain Wrist injuries are frequent in adults and children. A sprain is an injury to the ligaments that hold your bones together. A strain is an injury to muscle or muscle cord-like structures (tendons) from stretching or pulling. Generally, when wrists are moderately tender to touch following a fall or injury, a break in the bone (fracture) may be present. Most wrist sprains or strains are better in 3 to 5 days, but complete healing may take several weeks. HOME CARE INSTRUCTIONS   Put ice on the injured area.  Put ice in a plastic bag.  Place a towel between your skin and the bag.  Leave the ice on for 15-20 minutes, 03-04 times a day, for the first 2 days.  Keep your arm raised above the level of your heart whenever possible to reduce swelling and pain.  Rest the injured area for at least 48 hours or as directed by your caregiver.  If a splint or elastic bandage has been applied, use it for as long as directed by your caregiver or until seen by a caregiver for a follow-up exam.  Only take over-the-counter or prescription medicines for pain, discomfort, or fever as directed by your caregiver.  Keep all follow-up appointments. You may need to follow up with a specialist or have follow-up X-rays. Improvement in pain level is not a guarantee that you did not fracture a bone in your wrist. The only way to determine whether or not you have a broken bone is by X-ray. SEEK IMMEDIATE MEDICAL CARE IF:   Your fingers are swollen, very red, white, or cold and blue.  Your fingers are numb or tingling.  You have increasing pain.  You have difficulty moving your fingers. MAKE SURE YOU:   Understand these instructions.  Will watch your  condition.  Will get help right away if you are not doing well or get worse. Document Released: 05/10/2005 Document Revised: 10/23/2011 Document Reviewed: 09/21/2010 [facility] Patient Information 2014 [place].

## 2013-08-21 NOTE — Telephone Encounter (Signed)
Patient called in again regarding this °

## 2013-08-21 NOTE — Telephone Encounter (Signed)
Rx request to pharmacy/SLS  

## 2013-08-21 NOTE — Progress Notes (Signed)
Pre visit review using our clinic review tool, if applicable. No additional management support is needed unless otherwise documented below in the visit note/SLS  

## 2013-08-21 NOTE — Progress Notes (Signed)
[name], male   DOB: Nov 18, 1955, 58 y.o.   MRN: [id number]  Patient presents to clinic today for ED follow-up.  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  Patient diagnosed with wrist sprain.  Imaging was negative for fracture.  Patient was given a wrist splint and told to take ibuprofen for pain.  Was given instruction to follow-up with his PCP and was given a referral to see a hand surgeon.  Patient has not scheduled that appointment yet.  Patient has been wearing his wrist brace most of the time as instructed.  States he has a hard time wearing it when sleeping.  Patient endorses continued pain, although less severe than previously.  Has been taking ibuprofen for pain occasionally with some relief of symptoms.  Patient has history of HTN requiring lisinopril 10 mg.  Patient's BP elevates in clinic today at 140/100.  Patient has not taken his medicine yet today as he works 3rd shift and usually takes his medication in the AM right before bed.  Patient denies chest pain, palpitations, SOB, LH , dizziness or vision changes.  Past Medical History  Diagnosis Date  . Diabetes mellitus   . Hypertension   . History of chicken pox     childhood  . Seasonal allergies   . History of kidney stones   . Hyperlipidemia   . 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 Sciatica of right side 12/22/2012  . Insomnia 02/25/2013  . Abdominal pain, unspecified site 03/11/2013    Left side at site of excision   . Diarrhea 03/11/2013  . Acute neck pain 06/04/2013    Current Outpatient Prescriptions on File Prior to Visit  Medication Sig Dispense Refill  . aspirin EC 81 MG tablet Take 81 mg by mouth daily.      Marland Kitchen atorvastatin (LIPITOR) 20 MG tablet TAKE 1 TABLET BY MOUTH DAILY.  30 tablet  6  . glucose blood (ONE TOUCH ULTRA TEST) test strip Use as instructed  100 each  2  .  JANUVIA 100 MG tablet Take 1 tablet (100 mg total) by mouth daily.  30 tablet  1  . lisinopril (PRINIVIL,ZESTRIL) 10 MG tablet Take 1 tablet (10 mg total) by mouth 2 (two) times daily.  180 tablet  1  . metFORMIN (GLUCOPHAGE-XR) 500 MG 24 hr tablet TAKE 2 TABLETS BY MOUTH DAILY WITH BREAKFAST. START WITH 2 TABS THEN INCREASE TO 4 AS TOLERATED  60 tablet  5  . methocarbamol (ROBAXIN) 500 MG tablet Take 1 tablet (500 mg total) by mouth 4 (four) times daily as needed.  120 tablet  1  . VIAGRA 100 MG tablet TAKE 1/2 TO 1 TABLET BY MOUTH DAILY AS NEEDED FOR ERECTILE DYSFUNCTION  9 tablet  2  . zolpidem (AMBIEN) 10 MG tablet Take 1 tablet (10 mg total) by mouth at bedtime as needed for sleep.  20 tablet  1   No current facility-administered medications on file prior to visit.    Allergies  Allergen Reactions  . Contrast Media [Iodinated Diagnostic Agents]   . Iodine Swelling    Family History  Problem Relation Age of Onset  . Lung cancer Mother   . Breast cancer Neg Hx   . Colon cancer Neg Hx   . Prostate cancer Neg Hx   . Heart disease Neg Hx   .  Diabetes Maternal Aunt   . Cancer Paternal Grandmother     History   Social History  . Marital Status: Married    Spouse Name: N/A    Number of Children: N/A  . Years of Education: N/A   Social History Main Topics  . Smoking status: Never Smoker   . Smokeless tobacco: Never Used  . Alcohol Use: No  . Drug Use: No  . Sexual Activity: Not on file   Other Topics Concern  . Not on file   Social History Narrative  . No narrative on file   Review of Systems - See HPI.  All other ROS are negative.  There were no vitals filed for this visit.  Physical Exam  Vitals reviewed. Constitutional: He is oriented to person, place, and time and well-developed, well-nourished, and in no distress.  HENT:  Head: Normocephalic and atraumatic.  Eyes: Conjunctivae are normal. Pupils are equal, round, and reactive to light.  Neck: Neck supple.   Cardiovascular: Normal rate, regular rhythm, normal heart sounds and intact distal pulses.   Pulmonary/Chest: Effort normal and breath sounds normal.  Musculoskeletal:       Left elbow: Normal.       Left wrist: He exhibits tenderness. He exhibits normal range of motion, no bony tenderness, no swelling, no effusion, no crepitus, no deformity and no laceration.  Lymphadenopathy:    He has no cervical adenopathy.  Neurological: He is alert and oriented to person, place, and time.  Skin: Skin is warm and dry. No rash noted.  Psychiatric: Affect normal.     Recent Results (from the past 2160 hour(s))  BASIC METABOLIC PANEL     Status: Abnormal   Collection Time    05/28/13  3:00 PM      Result Value Range   Sodium 136  135 - 145 mEq/L   Potassium 3.6  3.5 - 5.1 mEq/L   Chloride 102  96 - 112 mEq/L   CO2 22  19 - 32 mEq/L   Glucose, Bld 216 (*) 70 - 99 mg/dL   BUN 11  6 - 23 mg/dL   Creatinine, Ser 0.60  0.50 - 1.35 mg/dL   Calcium 9.4  8.4 - 10.5 mg/dL   GFR calc non Af Amer >90  >90 mL/min   GFR calc Af Amer >90  >90 mL/min   Comment: (NOTE)     The eGFR has been calculated using the CKD EPI equation.     This calculation has not been validated in all clinical situations.     eGFR's persistently <90 mL/min signify possible Chronic Kidney     Disease.  CBC     Status: None   Collection Time    05/28/13  3:00 PM      Result Value Range   WBC 7.4  4.0 - 10.5 K/uL   RBC 4.64  4.22 - 5.81 MIL/uL   Hemoglobin 14.0  13.0 - 17.0 g/dL   HCT 39.2  39.0 - 52.0 %   MCV 84.5  78.0 - 100.0 fL   MCH 30.2  26.0 - 34.0 pg   MCHC 35.7  30.0 - 36.0 g/dL   RDW 12.8  11.5 - 15.5 %   Platelets 184  150 - 400 K/uL  TSH     Status: None   Collection Time    06/02/13  1:35 PM      Result Value Range   TSH 1.257  0.350 - 4.500 uIU/mL  CBC  Status: None   Collection Time    06/02/13  1:35 PM      Result Value Range   WBC 6.5  4.0 - 10.5 K/uL   RBC 4.81  4.22 - 5.81 MIL/uL   Hemoglobin  14.2  13.0 - 17.0 g/dL   HCT 40.7  39.0 - 52.0 %   MCV 84.6  78.0 - 100.0 fL   MCH 29.5  26.0 - 34.0 pg   MCHC 34.9  30.0 - 36.0 g/dL   RDW 14.2  11.5 - 15.5 %   Platelets 192  150 - 400 K/uL  RENAL FUNCTION PANEL     Status: Abnormal   Collection Time    06/02/13  1:35 PM      Result Value Range   Sodium 138  135 - 145 mEq/L   Potassium 4.6  3.5 - 5.3 mEq/L   Chloride 101  96 - 112 mEq/L   CO2 29  19 - 32 mEq/L   Glucose, Bld 163 (*) 70 - 99 mg/dL   BUN 13  6 - 23 mg/dL   Creat 0.71  0.50 - 1.35 mg/dL   Albumin 4.3  3.5 - 5.2 g/dL   Calcium 9.6  8.4 - 10.5 mg/dL   Phosphorus 3.6  2.3 - 4.6 mg/dL  HEPATIC FUNCTION PANEL     Status: None   Collection Time    06/02/13  1:35 PM      Result Value Range   Total Bilirubin 0.4  0.3 - 1.2 mg/dL   Bilirubin, Direct 0.1  0.0 - 0.3 mg/dL   Indirect Bilirubin 0.3  0.0 - 0.9 mg/dL   Alkaline Phosphatase 54  39 - 117 U/L   AST 13  0 - 37 U/L   ALT 14  0 - 53 U/L   Total Protein 7.0  6.0 - 8.3 g/dL   Albumin 4.3  3.5 - 5.2 g/dL    Assessment/Plan: No problem-specific assessment & plan notes found for this encounter.

## 2013-08-21 NOTE — Assessment & Plan Note (Signed)
Continue wrist splint x 2 weeks. Alternate ibuprofen and tylenol for pain.  Tramadol for severe pain.  Topical Salon Pas or Aspercreme.  Avoid heavy lifting.  ROM exercises of wrist as tolerated.  Please call or return to clinic if symptoms are not improving over the next week.

## 2013-09-03 ENCOUNTER — Encounter: Payer: Self-pay | Admitting: Physician Assistant

## 2013-09-03 ENCOUNTER — Ambulatory Visit (INDEPENDENT_AMBULATORY_CARE_PROVIDER_SITE_OTHER): Payer: 59 | Admitting: Physician Assistant

## 2013-09-03 VITALS — BP 102/80 | HR 96 | Temp 97.7°F | Resp 16 | Ht 72.0 in | Wt 191.0 lb

## 2013-09-03 DIAGNOSIS — R1032 Left lower quadrant pain: Secondary | ICD-10-CM

## 2013-09-03 LAB — CBC WITH DIFFERENTIAL/PLATELET
Basophils Absolute: 0 10*3/uL (ref 0.0–0.1)
Basophils Relative: 1 % (ref 0–1)
EOS PCT: 3 % (ref 0–5)
Eosinophils Absolute: 0.3 10*3/uL (ref 0.0–0.7)
HEMATOCRIT: 43.5 % (ref 39.0–52.0)
Hemoglobin: 15.3 g/dL (ref 13.0–17.0)
Lymphocytes Relative: 27 % (ref 12–46)
Lymphs Abs: 2.3 10*3/uL (ref 0.7–4.0)
MCH: 30.5 pg (ref 26.0–34.0)
MCHC: 35.2 g/dL (ref 30.0–36.0)
MCV: 86.8 fL (ref 78.0–100.0)
Monocytes Absolute: 0.8 10*3/uL (ref 0.1–1.0)
Monocytes Relative: 9 % (ref 3–12)
Neutro Abs: 5.1 10*3/uL (ref 1.7–7.7)
Neutrophils Relative %: 60 % (ref 43–77)
PLATELETS: 239 10*3/uL (ref 150–400)
RBC: 5.01 MIL/uL (ref 4.22–5.81)
RDW: 14 % (ref 11.5–15.5)
WBC: 8.5 10*3/uL (ref 4.0–10.5)

## 2013-09-03 LAB — BASIC METABOLIC PANEL
BUN: 11 mg/dL (ref 6–23)
CO2: 28 mEq/L (ref 19–32)
CREATININE: 0.74 mg/dL (ref 0.50–1.35)
Calcium: 9.4 mg/dL (ref 8.4–10.5)
Chloride: 102 mEq/L (ref 96–112)
Glucose, Bld: 154 mg/dL — ABNORMAL HIGH (ref 70–99)
Potassium: 4.5 mEq/L (ref 3.5–5.3)
Sodium: 137 mEq/L (ref 135–145)

## 2013-09-03 MED ORDER — ONDANSETRON HCL 4 MG PO TABS: 4.0000 mg | ORAL_TABLET | Freq: Three times a day (TID) | ORAL | Status: DC | PRN

## 2013-09-03 NOTE — Progress Notes (Signed)
Pre visit review using our clinic review tool, if applicable. No additional management support is needed unless otherwise documented below in the visit note. 

## 2013-09-03 NOTE — Patient Instructions (Signed)
Please obtain labs.  I will call you with your results.  Stop by the front desk to speak with Colletta Maryland about CT scan.  The concern is for diverticulitis, giving the focal left lower quadrant pain.  Take zofran as needed for nausea.  Continue Immodium.  Stay well-hydrated.  If pain becomes very severe or you develop fever of cannot tolerate liquids, please proceed tot he ER.

## 2013-09-04 ENCOUNTER — Ambulatory Visit (HOSPITAL_BASED_OUTPATIENT_CLINIC_OR_DEPARTMENT_OTHER)
Admission: RE | Admit: 2013-09-04 | Discharge: 2013-09-04 | Disposition: A | Discharge status: Home / Self Care | Payer: 59 | Source: Ambulatory Visit | Attending: Physician Assistant | Admitting: Physician Assistant

## 2013-09-04 ENCOUNTER — Other Ambulatory Visit: Payer: Self-pay | Admitting: Physician Assistant

## 2013-09-04 ENCOUNTER — Telehealth: Payer: Self-pay | Admitting: Physician Assistant

## 2013-09-04 DIAGNOSIS — R109 Unspecified abdominal pain: Secondary | ICD-10-CM | POA: Insufficient documentation

## 2013-09-04 DIAGNOSIS — R1032 Left lower quadrant pain: Secondary | ICD-10-CM

## 2013-09-04 DIAGNOSIS — I2510 Atherosclerotic heart disease of native coronary artery without angina pectoris: Secondary | ICD-10-CM | POA: Insufficient documentation

## 2013-09-04 DIAGNOSIS — K449 Diaphragmatic hernia without obstruction or gangrene: Secondary | ICD-10-CM | POA: Insufficient documentation

## 2013-09-04 DIAGNOSIS — R197 Diarrhea, unspecified: Secondary | ICD-10-CM

## 2013-09-04 DIAGNOSIS — I700 Atherosclerosis of aorta: Secondary | ICD-10-CM | POA: Insufficient documentation

## 2013-09-04 DIAGNOSIS — R112 Nausea with vomiting, unspecified: Secondary | ICD-10-CM | POA: Insufficient documentation

## 2013-09-04 NOTE — Telephone Encounter (Signed)
Discussed labs with the patient.  Labs look good.  Patient scheduled for CT abdomen and pelvis with contrast to rule out Diverticulitis.  Patient also encouraged to stop by the clinic to pick up supplies for stool testing.

## 2013-09-06 ENCOUNTER — Telehealth: Payer: Self-pay | Admitting: Family Medicine

## 2013-09-06 NOTE — Telephone Encounter (Signed)
CT RESULTS

## 2013-09-07 DIAGNOSIS — R1032 Left lower quadrant pain: Secondary | ICD-10-CM | POA: Insufficient documentation

## 2013-09-07 NOTE — Assessment & Plan Note (Signed)
Patient's current symptoms seem most likely consistent with a viral gastroenteritis. 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 Giving the patient's history of diverticulosis and left lower quadrant pain, will obtain a stat CBC, CMP and CT of abdomen and pelvis. For now, instructed patient to maintain adequate hydration. Avoid solid foods. Can use Imodium if needed. Zofran for nausea. Can use tramadol as needed for severe pain. Otherwise alternate Tylenol and ibuprofen. Will alter his therapy and start antibiotic if indicated by CAT scan.

## 2013-09-07 NOTE — Progress Notes (Signed)
Patient presents to clinic today complaining of nausea, vomiting, diarrhea and lower abdominal pain since early Tuesday morning. Patient states he woke up around 3 AM, felt very nauseous and threw up 3 times. Denies hematemesis. Patient states that the vomiting has resolved, but nausea has persisted. He now is having frequent loose stools. Denies melena, hematochezia or tenesmus. Patient does endorse abdominal bloating and lower abdominal pain that has worsened. Patient denies recent travel or sick contact. Patient denies fever or chills. Patient denies abnormal food or drinking source. Patient denies history of abdominal surgery, but endorses diverticulosis diagnosed by imaging. Denies history of diverticulitis.  Past Medical History  Diagnosis Date  . Diabetes mellitus   . Hypertension   . History of chicken pox     childhood  . Seasonal allergies   . History of kidney stones   . Hyperlipidemia   . 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 Sciatica of right side 12/22/2012  . Insomnia 02/25/2013  . Abdominal pain, unspecified site 03/11/2013    Left side at site of excision   . Diarrhea 03/11/2013  . Acute neck pain 06/04/2013    Current Outpatient Prescriptions on File Prior to Visit  Medication Sig Dispense Refill  . aspirin EC 81 MG tablet Take 81 mg by mouth daily.      Marland Kitchen atorvastatin (LIPITOR) 20 MG tablet TAKE 1 TABLET BY MOUTH DAILY.  30 tablet  6  . glucose blood (ONE TOUCH ULTRA TEST) test strip Use as instructed  100 each  2  . JANUVIA 100 MG tablet Take 1 tablet (100 mg total) by mouth daily.  30 tablet  1  . lisinopril (PRINIVIL,ZESTRIL) 10 MG tablet Take 1 tablet (10 mg total) by mouth 2 (two) times daily.  180 tablet  1  . metFORMIN (GLUCOPHAGE-XR) 500 MG 24 hr tablet TAKE 4 TABLETS BY MOUTH DAILY AS TOLERATED  120 tablet  5  . methocarbamol (ROBAXIN) 500 MG tablet Take 1 tablet (500 mg total) by mouth 4 (four) times daily as needed.  120  tablet  1  . traMADol (ULTRAM) 50 MG tablet Take 1 tablet (50 mg total) by mouth every 8 (eight) hours as needed.  30 tablet  0  . VIAGRA 100 MG tablet TAKE 1/2 TO 1 TABLET BY MOUTH DAILY AS NEEDED FOR ERECTILE DYSFUNCTION  9 tablet  2  . zolpidem (AMBIEN) 10 MG tablet Take 1 tablet (10 mg total) by mouth at bedtime as needed for sleep.  20 tablet  1   No current facility-administered medications on file prior to visit.    Allergies  Allergen Reactions  . Contrast Media [Iodinated Diagnostic Agents]   . Iodine Swelling    Family History  Problem Relation Age of Onset  . Lung cancer Mother   . Breast cancer Neg Hx   . Colon cancer Neg Hx   . Prostate cancer Neg Hx   . Heart disease Neg Hx   . Diabetes Maternal Aunt   . Cancer Paternal Grandmother     History   Social History  . Marital Status: Married    Spouse Name: N/A    Number of Children: N/A  . Years of Education: N/A   Social History Main Topics  . Smoking status: Never Smoker   . Smokeless tobacco: Never Used  . Alcohol Use: No  . Drug Use: No  . Sexual Activity: None   Other Topics Concern  .  None   Social History Narrative  . None   Review of Systems - See HPI.  All other ROS are negative.  Filed Vitals:   09/03/13 1543  BP: 102/80  Pulse: 96  Temp: 97.7 F (36.5 C)  Resp: 16   Physical Exam  Vitals reviewed. Constitutional: He is oriented to person, place, and time and well-developed, well-nourished, and in no distress.  HENT:  Head: Normocephalic and atraumatic.  Eyes: Conjunctivae are normal.  Neck: Neck supple.  Cardiovascular: Normal rate, regular rhythm and normal heart sounds.   Pulmonary/Chest: Breath sounds normal. No respiratory distress. He has no wheezes. He has no rales. He exhibits no tenderness.  Abdominal: Soft. He exhibits no distension and no mass. There is no rebound and no guarding.  Bowel sounds present all 4 quadrants. Bowel sounds are slightly hypoactive. Bilateral  lower abdominal tenderness to palpation, significantly more tender in left lower quadrant.  Neurological: He is alert and oriented to person, place, and time.  Skin: Skin is warm and dry. No rash noted.  Psychiatric: Affect normal.    Recent Results (from the past 2160 hour(s))  CBC WITH DIFFERENTIAL     Status: None   Collection Time    09/03/13  4:34 PM      Result Value Range   WBC 8.5  4.0 - 10.5 K/uL   RBC 5.01  4.22 - 5.81 MIL/uL   Hemoglobin 15.3  13.0 - 17.0 g/dL   HCT 43.5  39.0 - 52.0 %   MCV 86.8  78.0 - 100.0 fL   MCH 30.5  26.0 - 34.0 pg   MCHC 35.2  30.0 - 36.0 g/dL   RDW 14.0  11.5 - 15.5 %   Platelets 239  150 - 400 K/uL   Neutrophils Relative % 60  43 - 77 %   Neutro Abs 5.1  1.7 - 7.7 K/uL   Lymphocytes Relative 27  12 - 46 %   Lymphs Abs 2.3  0.7 - 4.0 K/uL   Monocytes Relative 9  3 - 12 %   Monocytes Absolute 0.8  0.1 - 1.0 K/uL   Eosinophils Relative 3  0 - 5 %   Eosinophils Absolute 0.3  0.0 - 0.7 K/uL   Basophils Relative 1  0 - 1 %   Basophils Absolute 0.0  0.0 - 0.1 K/uL   Smear Review Criteria for review not met    BASIC METABOLIC PANEL     Status: Abnormal   Collection Time    09/03/13  4:34 PM      Result Value Range   Sodium 137  135 - 145 mEq/L   Potassium 4.5  3.5 - 5.3 mEq/L   Chloride 102  96 - 112 mEq/L   CO2 28  19 - 32 mEq/L   Glucose, Bld 154 (*) 70 - 99 mg/dL   BUN 11  6 - 23 mg/dL   Creat 0.74  0.50 - 1.35 mg/dL   Calcium 9.4  8.4 - 10.5 mg/dL    Assessment/Plan: No problem-specific assessment & plan notes found for this encounter.

## 2013-09-08 ENCOUNTER — Encounter: Payer: Self-pay | Admitting: *Deleted

## 2013-09-08 ENCOUNTER — Other Ambulatory Visit: Payer: Self-pay | Admitting: Physician Assistant

## 2013-09-08 NOTE — Telephone Encounter (Signed)
Please call pt. He saw Einar Pheasant

## 2013-09-09 LAB — OVA AND PARASITE SCREEN: OP: NONE SEEN

## 2013-09-09 LAB — CLOSTRIDIUM DIFFICILE EIA: CDIFTX: NEGATIVE

## 2013-09-09 LAB — FECAL LACTOFERRIN, QUANT: LACTOFERRIN: NEGATIVE

## 2013-09-09 NOTE — Telephone Encounter (Signed)
Patient informed of CT results by CMA Tomma Lightning, several days ago, per result note.  I will have nursing call patient again to verify he is aware of normal CT.  Also patient's stool culture negative thus far.  Other stool testing, including O&P and C diff are negative.

## 2013-09-10 NOTE — Telephone Encounter (Signed)
Notes Recorded by Earnstine Regal, MA on 09/05/2013 at 10:55 AM Notified pt with Cody response.../lmb ------  Notes Recorded by Leeanne Rio, PA-C on 09/04/2013 at 4:02 PM Please inform patient that his CT scan was normal. He should continue medications and care as discussed at his visit. If symptoms are not improving, or if diarrhea persists, he should return to our lab so that we can obtain a stool sample. I have already placed orders for this, in case he returns to lab. He should return to clinic if symptoms worsen or new symptoms develop.  REITERATED GIVEN RESULTS [via VM] on 01.23.15 by Lorre Nick Brand/SLS

## 2013-09-11 ENCOUNTER — Telehealth: Payer: Self-pay

## 2013-09-11 DIAGNOSIS — E785 Hyperlipidemia, unspecified: Secondary | ICD-10-CM

## 2013-09-11 DIAGNOSIS — IMO0001 Reserved for inherently not codable concepts without codable children: Secondary | ICD-10-CM

## 2013-09-11 DIAGNOSIS — I10 Essential (primary) hypertension: Secondary | ICD-10-CM

## 2013-09-11 DIAGNOSIS — E1165 Type 2 diabetes mellitus with hyperglycemia: Principal | ICD-10-CM

## 2013-09-11 NOTE — Telephone Encounter (Signed)
Patient came in today for lab work to be drawn? Please advise what needs to be ran?

## 2013-09-11 NOTE — Telephone Encounter (Signed)
Labs hgba1c, cbc, tsh, hepatic and renal

## 2013-09-12 ENCOUNTER — Encounter: Payer: Self-pay | Admitting: Family Medicine

## 2013-09-12 ENCOUNTER — Ambulatory Visit (INDEPENDENT_AMBULATORY_CARE_PROVIDER_SITE_OTHER): Payer: 59 | Admitting: Family Medicine

## 2013-09-12 VITALS — BP 100/64 | HR 93 | Temp 98.3°F | Ht 72.0 in | Wt 193.1 lb

## 2013-09-12 DIAGNOSIS — R197 Diarrhea, unspecified: Secondary | ICD-10-CM

## 2013-09-12 DIAGNOSIS — I10 Essential (primary) hypertension: Secondary | ICD-10-CM

## 2013-09-12 DIAGNOSIS — R05 Cough: Secondary | ICD-10-CM

## 2013-09-12 DIAGNOSIS — R109 Unspecified abdominal pain: Secondary | ICD-10-CM

## 2013-09-12 DIAGNOSIS — R059 Cough, unspecified: Secondary | ICD-10-CM

## 2013-09-12 DIAGNOSIS — E1165 Type 2 diabetes mellitus with hyperglycemia: Secondary | ICD-10-CM

## 2013-09-12 DIAGNOSIS — IMO0001 Reserved for inherently not codable concepts without codable children: Secondary | ICD-10-CM

## 2013-09-12 LAB — HEPATIC FUNCTION PANEL
ALBUMIN: 4.4 g/dL (ref 3.5–5.2)
ALK PHOS: 61 U/L (ref 39–117)
ALT: 18 U/L (ref 0–53)
AST: 16 U/L (ref 0–37)
Bilirubin, Direct: 0.1 mg/dL (ref 0.0–0.3)
Indirect Bilirubin: 0.5 mg/dL (ref 0.2–1.2)
TOTAL PROTEIN: 7.5 g/dL (ref 6.0–8.3)
Total Bilirubin: 0.6 mg/dL (ref 0.2–1.2)

## 2013-09-12 LAB — RENAL FUNCTION PANEL
ALBUMIN: 4.4 g/dL (ref 3.5–5.2)
BUN: 15 mg/dL (ref 6–23)
CALCIUM: 9.8 mg/dL (ref 8.4–10.5)
CO2: 25 mEq/L (ref 19–32)
CREATININE: 0.76 mg/dL (ref 0.50–1.35)
Chloride: 100 mEq/L (ref 96–112)
Glucose, Bld: 142 mg/dL — ABNORMAL HIGH (ref 70–99)
Phosphorus: 3.5 mg/dL (ref 2.3–4.6)
Potassium: 4.4 mEq/L (ref 3.5–5.3)
Sodium: 136 mEq/L (ref 135–145)

## 2013-09-12 LAB — CBC
HEMATOCRIT: 42.1 % (ref 39.0–52.0)
Hemoglobin: 14.8 g/dL (ref 13.0–17.0)
MCH: 30 pg (ref 26.0–34.0)
MCHC: 35.2 g/dL (ref 30.0–36.0)
MCV: 85.2 fL (ref 78.0–100.0)
Platelets: 248 10*3/uL (ref 150–400)
RBC: 4.94 MIL/uL (ref 4.22–5.81)
RDW: 13.6 % (ref 11.5–15.5)
WBC: 7.4 10*3/uL (ref 4.0–10.5)

## 2013-09-12 LAB — STOOL CULTURE

## 2013-09-12 LAB — HEMOGLOBIN A1C
Hgb A1c MFr Bld: 8.1 % — ABNORMAL HIGH (ref <5.7)
MEAN PLASMA GLUCOSE: 186 mg/dL — AB (ref <117)

## 2013-09-12 LAB — AMYLASE: Amylase: 22 U/L (ref 0–105)

## 2013-09-12 LAB — LIPASE

## 2013-09-12 MED ORDER — HYDROCODONE-HOMATROPINE 5-1.5 MG/5ML PO SYRP: 5.0000 mL | ORAL_SOLUTION | Freq: Every evening | ORAL | Status: DC | PRN

## 2013-09-12 NOTE — Telephone Encounter (Signed)
Lab order placed.

## 2013-09-12 NOTE — Patient Instructions (Signed)
  Add a probiotic such as Digestive Advantage, Align, Acidophilus, Culturelle   Diarrhea Diarrhea is frequent loose and watery bowel movements. It can cause you to feel weak and dehydrated. Dehydration can cause you to become tired and thirsty, have a dry mouth, and have decreased urination that often is dark yellow. Diarrhea is a sign of another problem, most often an infection that will not last long. In most cases, diarrhea typically lasts 2 3 days. However, it can last longer if it is a sign of something more serious. It is important to treat your diarrhea as directed by your caregive to lessen or prevent future episodes of diarrhea. CAUSES  Some common causes include:  Gastrointestinal infections caused by viruses, bacteria, or parasites.  Food poisoning or food allergies.  Certain medicines, such as antibiotics, chemotherapy, and laxatives.  Artificial sweeteners and fructose.  Digestive disorders. HOME CARE INSTRUCTIONS  Ensure adequate fluid intake (hydration): have 1 cup (8 oz) of fluid for each diarrhea episode. Avoid fluids that contain simple sugars or sports drinks, fruit juices, whole milk products, and sodas. Your urine should be clear or pale yellow if you are drinking enough fluids. Hydrate with an oral rehydration solution that you can purchase at pharmacies, retail stores, and online. You can prepare an oral rehydration solution at home by mixing the following ingredients together:    tsp table salt.   tsp baking soda.   tsp salt substitute containing potassium chloride.  1  tablespoons sugar.  1 L (34 oz) of water.  Certain foods and beverages may increase the speed at which food moves through the gastrointestinal (GI) tract. These foods and beverages should be avoided and include:  Caffeinated and alcoholic beverages.  High-fiber foods, such as raw fruits and vegetables, nuts, seeds, and whole grain breads and cereals.  Foods and beverages sweetened with  sugar alcohols, such as xylitol, sorbitol, and mannitol.  Some foods may be well tolerated and may help thicken stool including:  Starchy foods, such as rice, toast, pasta, low-sugar cereal, oatmeal, grits, baked potatoes, crackers, and bagels.  Bananas.  Applesauce.  Add probiotic-rich foods to help increase healthy bacteria in the GI tract, such as yogurt and fermented milk products.  Wash your hands well after each diarrhea episode.  Only take over-the-counter or prescription medicines as directed by your caregiver.  Take a warm bath to relieve any burning or pain from frequent diarrhea episodes. SEEK IMMEDIATE MEDICAL CARE IF:   You are unable to keep fluids down.  You have persistent vomiting.  You have blood in your stool, or your stools are black and tarry.  You do not urinate in 6 8 hours, or there is only a small amount of very dark urine.  You have abdominal pain that increases or localizes.  You have weakness, dizziness, confusion, or lightheadedness.  You have a severe headache.  Your diarrhea gets worse or does not get better.  You have a fever or persistent symptoms for more than 2 3 days.  You have a fever and your symptoms suddenly get worse. MAKE SURE YOU:   Understand these instructions.  Will watch your condition.  Will get help right away if you are not doing well or get worse. Document Released: 07/21/2002 Document Revised: 07/17/2012 Document Reviewed: 04/07/2012 [facility] Patient Information 2014 [place], [place].

## 2013-09-12 NOTE — Progress Notes (Signed)
[name], male   DOB: August 15, 1955, 58 y.o.   MRN: [id number] Albert [name] [id number] [birth year] 09/12/2013      Progress Note-Follow Up  Subjective  Chief Complaint  Chief Complaint  Patient presents with  . Follow-up    10 week     HPI  58-year-old male who is in today for followup. Unfortunately he continues to have frequent loose stool. At the worst he was up to 20 loose stool a day and he is now down to roughly 4 times. He is having persistent nausea but no vomiting. He is having abdominal pain he describes as constant, left and radiating to the right frequently. He continues to struggle with myalgias and fatigue as well. Has a dry cough which is worse at night but not productive. No fevers or chills. No bloody or tarry stool. No headaches. Taking medications as prescribed. Does complain of some mild polyuria as well there  Past Medical History  Diagnosis Date  . Diabetes mellitus   . Hypertension   . History of chicken pox     childhood  . Seasonal allergies   . History of kidney stones   . Hyperlipidemia   . 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 Sciatica of right side 12/22/2012  . Insomnia 02/25/2013  . Abdominal pain, unspecified site 03/11/2013    Left side at site of excision   . Diarrhea 03/11/2013  . Acute neck pain 06/04/2013    Past Surgical History  Procedure Laterality Date  . Hernia repair    . Abdominal hernia repair    . Rotator cuff repair    . Cholecystectomy    . Mass excision  05/22/2012    Procedure: EXCISION MASS;  Surgeon: Joyice Faster. Cornett, MD;  Location: [facility];  Service: General;  Laterality: Left;  excision abdominal wall mass    Family History  Problem Relation Age of Onset  . Lung cancer Mother   . Breast cancer Neg Hx   . Colon cancer Neg Hx   . Prostate cancer Neg Hx   . Heart disease Neg Hx   . Diabetes Maternal Aunt   . Cancer Paternal Grandmother     History    Social History  . Marital Status: Married    Spouse Name: N/A    Number of Children: N/A  . Years of Education: N/A   Occupational History  . Not on file.   Social History Main Topics  . Smoking status: Never Smoker   . Smokeless tobacco: Never Used  . Alcohol Use: No  . Drug Use: No  . Sexual Activity: Not on file   Other Topics Concern  . Not on file   Social History Narrative  . No narrative on file    Current Outpatient Prescriptions on File Prior to Visit  Medication Sig Dispense Refill  . aspirin EC 81 MG tablet Take 81 mg by mouth daily.      Marland Kitchen atorvastatin (LIPITOR) 20 MG tablet TAKE 1 TABLET BY MOUTH DAILY.  30 tablet  6  . glucose blood (ONE TOUCH ULTRA TEST) test strip Use as instructed  100 each  2  . JANUVIA 100 MG tablet Take 1 tablet (100 mg total) by mouth daily.  30 tablet  1  . lisinopril (PRINIVIL,ZESTRIL) 10 MG tablet Take 1 tablet (10 mg total) by mouth 2 (two) times daily.  180 tablet  1  .  metFORMIN (GLUCOPHAGE-XR) 500 MG 24 hr tablet TAKE 4 TABLETS BY MOUTH DAILY AS TOLERATED  120 tablet  5  . ondansetron (ZOFRAN) 4 MG tablet Take 1 tablet (4 mg total) by mouth every 8 (eight) hours as needed for nausea or vomiting.  20 tablet  0  . VIAGRA 100 MG tablet TAKE 1/2 TO 1 TABLET BY MOUTH DAILY AS NEEDED FOR ERECTILE DYSFUNCTION  9 tablet  2  . zolpidem (AMBIEN) 10 MG tablet Take 1 tablet (10 mg total) by mouth at bedtime as needed for sleep.  20 tablet  1   No current facility-administered medications on file prior to visit.    Allergies  Allergen Reactions  . Contrast Media [Iodinated Diagnostic Agents]   . Iodine Swelling    Review of Systems  Review of Systems  Constitutional: Negative for fever and malaise/fatigue.  HENT: Negative for congestion.   Eyes: Negative for discharge.  Respiratory: Negative for shortness of breath.   Cardiovascular: Negative for chest pain, palpitations and leg swelling.  Gastrointestinal: Negative for nausea,  abdominal pain and diarrhea.  Genitourinary: Negative for dysuria.  Musculoskeletal: Negative for falls.  Skin: Negative for rash.  Neurological: Negative for loss of consciousness and headaches.  Endo/Heme/Allergies: Negative for polydipsia.  Psychiatric/Behavioral: Negative for depression and suicidal ideas. The patient is not nervous/anxious and does not have insomnia.     Objective  BP 100/64  Pulse 93  Temp(Src) 98.3 F (36.8 C) (Oral)  Ht 6' (1.829 m)  Wt 193 lb 1.3 oz (87.581 kg)  BMI 26.18 kg/m2  SpO2 97%  Physical Exam  Physical Exam  Constitutional: He is oriented to person, place, and time and well-developed, well-nourished, and in no distress. No distress.  HENT:  Head: Normocephalic and atraumatic.  Eyes: Conjunctivae are normal.  Neck: Neck supple. No thyromegaly present.  Cardiovascular: Normal rate, regular rhythm and normal heart sounds.   No murmur heard. Pulmonary/Chest: Effort normal and breath sounds normal. No respiratory distress.  Abdominal: He exhibits no distension and no mass. There is no tenderness.  Musculoskeletal: He exhibits no edema.  Neurological: He is alert and oriented to person, place, and time.  Skin: Skin is warm.  Psychiatric: Memory, affect and judgment normal.    Lab Results  Component Value Date   TSH 1.257 06/02/2013   Lab Results  Component Value Date   WBC 8.5 09/03/2013   HGB 15.3 09/03/2013   HCT 43.5 09/03/2013   MCV 86.8 09/03/2013   PLT 239 09/03/2013   Lab Results  Component Value Date   CREATININE 0.74 09/03/2013   BUN 11 09/03/2013   NA 137 09/03/2013   K 4.5 09/03/2013   CL 102 09/03/2013   CO2 28 09/03/2013   Lab Results  Component Value Date   ALT 14 06/02/2013   AST 13 06/02/2013   ALKPHOS 54 06/02/2013   BILITOT 0.4 06/02/2013   Lab Results  Component Value Date   CHOL 121 02/21/2013   Lab Results  Component Value Date   HDL 38* 02/21/2013   Lab Results  Component Value Date   LDLCALC 63  02/21/2013   Lab Results  Component Value Date   TRIG 99 02/21/2013   Lab Results  Component Value Date   CHOLHDL 3.2 02/21/2013     Assessment & Plan  HTN (hypertension) Well controlled, no changes  Diarrhea With abdominal pain, no cause so far on work up. Referred to GI for further consideration  Abdominal pain, unspecified site Check  psa  Type II or unspecified type diabetes mellitus without mention of complication, uncontrolled Still poorly controlled, encouraged to avoid simple carbs and consider increasing Metformin to 5 tabs daily, continue Januvia

## 2013-09-12 NOTE — Progress Notes (Signed)
Pre visit review using our clinic review tool, if applicable. No additional management support is needed unless otherwise documented below in the visit note. 

## 2013-09-13 LAB — TSH: TSH: 3.144 u[IU]/mL (ref 0.350–4.500)

## 2013-09-13 LAB — PSA: PSA: 0.64 ng/mL (ref <=4.00)

## 2013-09-14 ENCOUNTER — Encounter: Payer: Self-pay | Admitting: Family Medicine

## 2013-09-14 NOTE — Assessment & Plan Note (Signed)
Well controlled, no changes 

## 2013-09-14 NOTE — Assessment & Plan Note (Signed)
Check psa 

## 2013-09-14 NOTE — Assessment & Plan Note (Signed)
With abdominal pain, no cause so far on work up. Referred to GI for further consideration

## 2013-09-14 NOTE — Assessment & Plan Note (Signed)
Still poorly controlled, encouraged to avoid simple carbs and consider increasing Metformin to 5 tabs daily, continue Januvia

## 2013-09-15 ENCOUNTER — Telehealth: Payer: Self-pay | Admitting: *Deleted

## 2013-09-15 ENCOUNTER — Encounter: Payer: Self-pay | Admitting: Family Medicine

## 2013-09-15 ENCOUNTER — Telehealth: Payer: Self-pay

## 2013-09-15 MED ORDER — FLUCONAZOLE 150 MG PO TABS: 150.0000 mg | ORAL_TABLET | ORAL | Status: DC

## 2013-09-15 NOTE — Telephone Encounter (Signed)
Relevant patient education assigned to patient using Emmi. ° °

## 2013-09-15 NOTE — Telephone Encounter (Signed)
Patient wanted to add a statement to the emails he sent you. Pt wanted you to know that the med Einar Pheasant gave is not working. Also, that pt states that he been to the bathroom 12 times today.

## 2013-09-15 NOTE — Telephone Encounter (Signed)
Have him drop his Metformin to 2 tabs daily for now and see if that helps. Also will add Questran if diflucan and imodium do not help

## 2013-09-16 NOTE — Telephone Encounter (Signed)
Left detailed with info given to pt. Advised pt to return call if he has any questions.

## 2013-09-17 ENCOUNTER — Telehealth: Payer: Self-pay

## 2013-09-17 NOTE — Telephone Encounter (Signed)
Mychart message sent.

## 2013-09-19 ENCOUNTER — Encounter: Payer: Self-pay | Admitting: Internal Medicine

## 2013-09-21 ENCOUNTER — Encounter: Payer: Self-pay | Admitting: Family Medicine

## 2013-09-22 ENCOUNTER — Encounter: Payer: Self-pay | Admitting: Nurse Practitioner

## 2013-09-22 ENCOUNTER — Ambulatory Visit (INDEPENDENT_AMBULATORY_CARE_PROVIDER_SITE_OTHER): Payer: 59 | Admitting: Nurse Practitioner

## 2013-09-22 VITALS — BP 113/81 | HR 103 | Temp 97.9°F | Ht 72.0 in | Wt 189.0 lb

## 2013-09-22 DIAGNOSIS — R1032 Left lower quadrant pain: Secondary | ICD-10-CM

## 2013-09-22 DIAGNOSIS — R1012 Left upper quadrant pain: Secondary | ICD-10-CM

## 2013-09-22 DIAGNOSIS — R197 Diarrhea, unspecified: Secondary | ICD-10-CM

## 2013-09-22 NOTE — Telephone Encounter (Signed)
Please call and schedule appt for pt per [facility]

## 2013-09-22 NOTE — Patient Instructions (Signed)
Continue immodium as needed. Eat apple sauce as it helps bind stool. Sip liquids-make hydration solution below. Go to ER if pain gets worse or you have new symptoms. You will see a PA at Dr. Vena Rua office on Friday.    Diarrhea Diarrhea is frequent loose and watery bowel movements. It can cause you to feel weak and dehydrated. Dehydration can cause you to become tired and thirsty, have a dry mouth, and have decreased urination that often is dark yellow. Diarrhea is a sign of another problem, most often an infection that will not last long. In most cases, diarrhea typically lasts 2 3 days. However, it can last longer if it is a sign of something more serious. It is important to treat your diarrhea as directed by your caregive to lessen or prevent future episodes of diarrhea. CAUSES  Some common causes include:  Gastrointestinal infections caused by viruses, bacteria, or parasites.  Food poisoning or food allergies.  Certain medicines, such as antibiotics, chemotherapy, and laxatives.  Artificial sweeteners and fructose.  Digestive disorders. HOME CARE INSTRUCTIONS  Ensure adequate fluid intake (hydration): have 1 cup (8 oz) of fluid for each diarrhea episode. Avoid fluids that contain simple sugars or sports drinks, fruit juices, whole milk products, and sodas. Your urine should be clear or pale yellow if you are drinking enough fluids. Hydrate with an oral rehydration solution that you can purchase at pharmacies, retail stores, and online. You can prepare an oral rehydration solution at home by mixing the following ingredients together:    tsp table salt.   tsp baking soda.   tsp salt substitute containing potassium chloride.  1  tablespoons sugar.  1 L (34 oz) of water.  Certain foods and beverages may increase the speed at which food moves through the gastrointestinal (GI) tract. These foods and beverages should be avoided and include:  Caffeinated and alcoholic  beverages.  High-fiber foods, such as raw fruits and vegetables, nuts, seeds, and whole grain breads and cereals.  Foods and beverages sweetened with sugar alcohols, such as xylitol, sorbitol, and mannitol.  Some foods may be well tolerated and may help thicken stool including:  Starchy foods, such as rice, toast, pasta, low-sugar cereal, oatmeal, grits, baked potatoes, crackers, and bagels.  Bananas.  Applesauce.  Add probiotic-rich foods to help increase healthy bacteria in the GI tract, such as yogurt and fermented milk products.  Wash your hands well after each diarrhea episode.  Only take over-the-counter or prescription medicines as directed by your caregiver.  Take a warm bath to relieve any burning or pain from frequent diarrhea episodes. SEEK IMMEDIATE MEDICAL CARE IF:   You are unable to keep fluids down.  You have persistent vomiting.  You have blood in your stool, or your stools are black and tarry.  You do not urinate in 6 8 hours, or there is only a small amount of very dark urine.  You have abdominal pain that increases or localizes.  You have weakness, dizziness, confusion, or lightheadedness.  You have a severe headache.  Your diarrhea gets worse or does not get better.  You have a fever or persistent symptoms for more than 2 3 days.  You have a fever and your symptoms suddenly get worse. MAKE SURE YOU:   Understand these instructions.  Will watch your condition.  Will get help right away if you are not doing well or get worse. Document Released: 07/21/2002 Document Revised: 07/17/2012 Document Reviewed: 04/07/2012 [facility] Patient Information 2014 [place], [place].

## 2013-09-22 NOTE — Progress Notes (Signed)
Pre-visit discussion using our clinic review tool. No additional management support is needed unless otherwise documented below in the visit note.  

## 2013-09-22 NOTE — Telephone Encounter (Signed)
I think this patient needs to be seen in the office today.  Please contact him to schedule OV.

## 2013-09-23 ENCOUNTER — Other Ambulatory Visit: Payer: Self-pay | Admitting: Nurse Practitioner

## 2013-09-23 ENCOUNTER — Encounter: Payer: Self-pay | Admitting: Family Medicine

## 2013-09-23 ENCOUNTER — Telehealth: Payer: Self-pay | Admitting: Family Medicine

## 2013-09-23 NOTE — Progress Notes (Signed)
Subjective:     ZAIDEN LUDLUM is a 58 y.o. male who presents for evaluation of abdominal pain. Onset was 3 weeks ago. Symptoms have been persistent and now accompanied by vomiting for 2 days. The pain is described as aching, and is 5/10 in intensity. Pain is located in the LUQ and LLQ without radiation.  Aggravating factors: none.  Alleviating factors: none. Associated symptoms: belching, diarrhea and nausea. The patient denies fever, headache and hematochezia.  The patient's history has been marked as reviewed and updated as appropriate.  Review of Systems Pertinent items are noted in HPI.     Objective:    BP 113/81  Pulse 103  Temp(Src) 97.9 F (36.6 C) (Temporal)  Ht 6' (1.829 m)  Wt 189 lb (85.73 kg)  BMI 25.63 kg/m2  SpO2 98% General appearance: alert, cooperative, appears stated age, fatigued, mild distress and pale Head: Normocephalic, without obvious abnormality, atraumatic Eyes: negative findings: lids and lashes normal and conjunctivae and sclerae normal Lungs: clear to auscultation bilaterally Heart: regular rate and rhythm, S1, S2 normal, no murmur, click, rub or gallop Abdomen: LUQ & LLQ tenderness to palpation. Scar & atrophy L side abdomen adjacent to umbilicus. NO HSM or Masses palpated.    Assessment:    See A&P in prob list.   Plan:     See A&P in prob list.

## 2013-09-23 NOTE — Assessment & Plan Note (Signed)
Diarrhea X 3 1/2 weeks, described as watery with soft solids. New onset vomiting 2 da. Persistent LUQ & LLQ abd pain.  W/U has included: abd CTw/o ct that shows "postsurgical changes L abdominal wall", small fat filled hernia, no findings that explain symptoms; Stool studies-neg c-diff, o&P or other infectious source; CBC nml. DD: IBD; surgical adhesions causing pain/ blockage. Labs to include p-anca, celiac panel, ESR, bmet. Continue immodium PRN, full liquid diet, oral hydration. Called [facility], they will see Albert [name] 2/13 @ 10:30. Advised to go to ER if symptoms worsen.

## 2013-09-23 NOTE — Telephone Encounter (Signed)
I see that Keith Rake saw pt today for office visit.

## 2013-09-23 NOTE — Telephone Encounter (Signed)
PATIENT HAS THROWN UP SEVERAL TIMES.  HE IS CONCERNED BECAUSE IT IS YELLOW.  PLEASE CALL AND ADVISE WHAT THAT MEANDS

## 2013-09-23 NOTE — Telephone Encounter (Signed)
See my chart message

## 2013-09-24 LAB — GLIA (IGA/G) + TTG IGA
GLIADIN IGA: 4 U/mL (ref <20)
Gliadin IgG: 27.1 U/mL — ABNORMAL HIGH (ref <20)
TISSUE TRANSGLUTAMINASE AB, IGA: 7.3 U/mL (ref <20)

## 2013-09-24 LAB — PAN-ANCA
Atypical p-ANCA Screen: NEGATIVE
C-ANCA SCREEN: NEGATIVE
Myeloperoxidase Abs: <1
Serine Protease 3: <0.2
p-ANCA Screen: NEGATIVE

## 2013-09-26 ENCOUNTER — Ambulatory Visit (INDEPENDENT_AMBULATORY_CARE_PROVIDER_SITE_OTHER): Payer: 59 | Admitting: Physician Assistant

## 2013-09-26 ENCOUNTER — Encounter: Payer: Self-pay | Admitting: Physician Assistant

## 2013-09-26 ENCOUNTER — Other Ambulatory Visit: Payer: 59

## 2013-09-26 VITALS — BP 130/64 | HR 78 | Ht 72.0 in | Wt 191.0 lb

## 2013-09-26 DIAGNOSIS — R634 Abnormal weight loss: Secondary | ICD-10-CM

## 2013-09-26 DIAGNOSIS — R197 Diarrhea, unspecified: Secondary | ICD-10-CM

## 2013-09-26 DIAGNOSIS — R1032 Left lower quadrant pain: Secondary | ICD-10-CM

## 2013-09-26 MED ORDER — MOVIPREP 100 G PO SOLR: 1.0000 | ORAL | Status: DC

## 2013-09-26 MED ORDER — ONDANSETRON 4 MG PO TBDP: ORAL_TABLET | ORAL | Status: DC

## 2013-09-26 MED ORDER — SACCHAROMYCES BOULARDII 250 MG PO CAPS: 250.0000 mg | ORAL_CAPSULE | Freq: Two times a day (BID) | ORAL | Status: DC

## 2013-09-26 NOTE — Progress Notes (Signed)
Subjective:    [name], male    DOB: [birth year], 58 y.o.   MRN: [id number]  HPI Scout is a  58 year old white male new to GI today referred by Dr. Randel Pigg for complaint of    significant diarrhea. [facility] is [facility]. He has history of adult-onset diabetes mellitus is status post cholecystectomy and umbilical hernia repair. He also has history of hypertension. Is he says he has been sick over the past 6-8 weeks. He also has had some intermittent nausea and vomiting over the past 8 weeks. He says on most days he is having at least 15 bowel movements per day and frequently will have need to get up in the middle of the night with diarrhea as well. Sometimes the stools are small volume other times larger volume, always liquid malodorous and nonbloody. He is also had some cramping and discomfort in left side of his abdomen and periodic nausea. There's been no fever chills or sweats. His appetite has been decreased somewhat in his weight is down about 5 pounds. Workup through primary care thus far with CT of the abdomen and pelvis with no IV contrast done on January 21 was unremarkable. Labs including stool culture stool O. and P. stool for C. difficile toxin and lactoferrin all unremarkable. He had a TTG , also negative. He is being treated impair cleave for possible he stood a course of Diflucan no has not noticed any improvement in his symptoms. He has not been on antibiotics that he can recall. No travel, camping etc. and no family members ill. He has had prior colonoscopy he believes done in [place] in 2006 when he was undergone workup for his gallbladder and says he was told it was negative.     Review of Systems  Constitutional: Positive for fatigue and unexpected weight change.  HENT: Negative.   Eyes: Negative.   Respiratory: Negative.   Cardiovascular: Negative.   Gastrointestinal: Positive for nausea, vomiting, abdominal pain and diarrhea.    Endocrine: Negative.   Genitourinary: Negative.   Musculoskeletal: Negative.   Allergic/Immunologic: Negative.   Neurological: Negative.   Hematological: Negative.   Psychiatric/Behavioral: Negative.    Outpatient Prescriptions Prior to Visit  Medication Sig Dispense Refill  . aspirin EC 81 MG tablet Take 81 mg by mouth daily.      Marland Kitchen atorvastatin (LIPITOR) 20 MG tablet TAKE 1 TABLET BY MOUTH DAILY.  30 tablet  6  . fluconazole (DIFLUCAN) 150 MG tablet Take 1 tablet (150 mg total) by mouth as directed. Take 2 tablets po daily X 7 days, then take 1 tab every week X 3 weeks.  NO STATINS WHILE TAKING THIS MEDICATION  17 tablet  0  . glucose blood (ONE TOUCH ULTRA TEST) test strip Use as instructed  100 each  2  . JANUVIA 100 MG tablet Take 1 tablet (100 mg total) by mouth daily.  30 tablet  1  . lisinopril (PRINIVIL,ZESTRIL) 10 MG tablet Take 1 tablet (10 mg total) by mouth 2 (two) times daily.  180 tablet  1  . metFORMIN (GLUCOPHAGE-XR) 500 MG 24 hr tablet TAKE 4 TABLETS BY MOUTH DAILY AS TOLERATED  120 tablet  5  . VIAGRA 100 MG tablet TAKE 1/2 TO 1 TABLET BY MOUTH DAILY AS NEEDED FOR ERECTILE DYSFUNCTION  9 tablet  2  . zolpidem (AMBIEN) 10 MG tablet Take 1 tablet (10 mg total) by mouth at bedtime as needed for sleep.  20 tablet  1  . HYDROcodone-homatropine (HYCODAN) 5-1.5 MG/5ML syrup Take 5 mLs by mouth at bedtime as needed for cough.  120 mL  0  . ondansetron (ZOFRAN) 4 MG tablet Take 1 tablet (4 mg total) by mouth every 8 (eight) hours as needed for nausea or vomiting.  20 tablet  0   No facility-administered medications prior to visit.   Allergies  Allergen Reactions  . Contrast Media [Iodinated Diagnostic Agents]   . Iodine Swelling   Patient Active Problem List   Diagnosis Date Noted  . LLQ pain 09/07/2013  . Wrist sprain 08/21/2013  . Acute neck pain 06/04/2013  . Pain at surgical incision 04/10/2013  . Abdominal pain, unspecified site 03/11/2013  . Diarrhea 03/11/2013   . Insomnia 02/25/2013  . Sciatica of right side 12/22/2012  . HTN (hypertension) 11/22/2012  . Headache 11/15/2012  . Right bundle branch block 11/15/2012  . Skin lesion of scalp 08/15/2012  . Type II or unspecified type diabetes mellitus without mention of complication, uncontrolled 02/09/2012  . Other and unspecified hyperlipidemia 02/09/2012  . Lumbar radiculopathy 02/09/2012   History  Substance Use Topics  . Smoking status: Never Smoker   . Smokeless tobacco: Never Used  . Alcohol Use: No   family history includes Cancer in his paternal grandmother; Diabetes in his maternal aunt; Lung cancer in his mother. There is no history of Breast cancer, Colon cancer, Prostate cancer, or Heart disease.     Objective:   Physical Exam  Well-developed white male in no acute distress, accompanied by his wife blood pressure 130/64 pulse 78 height 6 foot weight 191. HEENT; nontraumatic normocephalic EOMI PERRLA sclera anicteric, Supple; no JVD, Cardiovascular; regular rate and rhythm with S1-S2 no murmur or gallop, Pulm; clear bilaterally, Abdomen; soft he is tender in the left mid quadrant left lower are current no palpable mass or hepatosplenomegaly no guarding or rebound bowel sounds are hyperactive, Rectal; exam not done, Extremities; no clubbing cyanosis or edema skin warm and dry, Psych; mood and affect normal and appropriate      Assessment & Plan:  #39   58 year old male with 6-8 week history of persistent significant diarrhea with up to 15 bowel movements per day, left-sided abdominal cramping weight loss and intermittent nausea and vomiting . Etiology not clear though still suspect this may be infectious/IE C. difficile  Other possibility includes IBD or microscopic colitis   #2 diabetes mellitus  #3 status post remote cholecystectomy and umbilical hernia repair  #4 hypertension   Plan ; Stool for C. difficile by PCR  Zofran ODT 4 mg every 6 hours when necessary for nausea vomiting   Start Florastor  twice daily x3 weeks  Start Flagyl empirically 250 mg by mouth 4 times daily with food x14 days  Low residue diet  Will schedule for colonoscopy with Dr. Scarlette Shorts in about 3 weeks. Procedure discussed in detail with patient and his wife and they are agreeable to proceed. Should he have resolution of his symptoms in the interim this may be able to be canceled

## 2013-09-26 NOTE — Progress Notes (Signed)
Agree with initial assessment and plans 

## 2013-09-26 NOTE — Patient Instructions (Signed)
Please go to the basement level to our lab for a stool study. We sent prescriptions to [facility]. 1. Flagyl 2. FLorastor and samples given 2. Zofran ODT- dissolve tablets on the tongue for nausea.  You have been scheduled for a colonoscopy with propofol. Please follow written instructions given to you at your visit today.  Please pick up your prep kit at the pharmacy within the next 1-3 days. If you use inhalers (even only as needed), please bring them with you on the day of your procedure. Marland Kitchen

## 2013-09-29 LAB — CLOSTRIDIUM DIFFICILE BY PCR: CDIFFPCR: NOT DETECTED

## 2013-10-06 ENCOUNTER — Ambulatory Visit: Payer: Self-pay | Admitting: Internal Medicine

## 2013-10-22 ENCOUNTER — Ambulatory Visit (AMBULATORY_SURGERY_CENTER): Payer: 59 | Admitting: Internal Medicine

## 2013-10-22 ENCOUNTER — Encounter: Payer: Self-pay | Admitting: Internal Medicine

## 2013-10-22 VITALS — BP 126/79 | HR 73 | Temp 97.3°F | Resp 12 | Ht 72.0 in | Wt 191.0 lb

## 2013-10-22 DIAGNOSIS — R197 Diarrhea, unspecified: Secondary | ICD-10-CM

## 2013-10-22 DIAGNOSIS — D126 Benign neoplasm of colon, unspecified: Secondary | ICD-10-CM

## 2013-10-22 DIAGNOSIS — D129 Benign neoplasm of anus and anal canal: Secondary | ICD-10-CM

## 2013-10-22 DIAGNOSIS — D128 Benign neoplasm of rectum: Secondary | ICD-10-CM

## 2013-10-22 LAB — GLUCOSE, CAPILLARY
GLUCOSE-CAPILLARY: 213 mg/dL — AB (ref 70–99)
Glucose-Capillary: 224 mg/dL — ABNORMAL HIGH (ref 70–99)

## 2013-10-22 MED ORDER — SODIUM CHLORIDE 0.9 % IV SOLN
500.0000 mL | INTRAVENOUS | Status: DC
Start: 2013-10-22 — End: 2013-10-23

## 2013-10-22 NOTE — Op Note (Signed)
[place]  [address]. [place], [address]   COLONOSCOPY PROCEDURE REPORT  PATIENT: Albert [name], Albert [name]  MR#: [id number] BIRTHDATE: [birth year] , 40  yrs. old GENDER: Male ENDOSCOPIST: Eustace Quail, MD REFERRED IN:[facility] Charlett Blake, M.D. PROCEDURE DATE:  10/22/2013 PROCEDURE:   Colonoscopy with snare polypectomy x 3; and Colonoscopy with biopsy First Screening Colonoscopy - Avg.  risk and is 50 yrs.  old or older - No.  Prior Negative Screening - Now for repeat screening. N/A  History of Adenoma - Now for follow-up colonoscopy & has been > or = to 3 yrs.  N/A  Polyps Removed Today? Yes. ASA CLASS:   Class II INDICATIONS:chronic diarrhea and unexplained diarrhea. MEDICATIONS: MAC sedation, administered by CRNA and propofol (Diprivan) 300mg  IV DESCRIPTION OF PROCEDURE:   After the risks benefits and alternatives of the procedure were thoroughly explained, informed consent was obtained.  A digital rectal exam revealed no abnormalities of the rectum.   The [id number]  endoscope was introduced through the anus and advanced to the cecum, which was identified by both the appendix and ileocecal valve. No adverse events experienced.   The quality of the prep was excellent, using MoviPrep  The instrument was then slowly withdrawn as the colon was fully examined.  COLON FINDINGS: The mucosa appeared normal in the terminal ileum. Three diminutive polyps were found at the cecum and in the ascending colon.  A polypectomy was performed with a cold snare. The resection was complete and the polyp tissue was completely retrieved.   Mild diverticulosis was noted throughout the colon. The colon mucosa was otherwise normal. Random colon bx taken. Retroflexed views revealed internal hemorrhoids. The time to cecum=2 minutes 38 seconds.  Withdrawal time=16 minutes 55 seconds. The scope was withdrawn and the procedure completed. COMPLICATIONS: There were no  complications. ENDOSCOPIC IMPRESSION: 1.   Normal mucosa in the terminal ileum 2.   Three diminutive polyps were found in the colon; polypectomy was performed with a cold snare 3.   Mild diverticulosis was noted throughout the entire examined colon 4.   The colon mucosa was otherwise normal . Random colon biopsies taken RECOMMENDATIONS: 1.  Await pathology results 2.  Call office for follow-up appointment in 4-6 weeks 3. OK to use imodium more regularly 4.  Repeat colonoscopy in 5 years if polyp adenomatous; otherwise 10 years   eSigned:  Eustace Quail, MD 10/22/2013 2:45 PM   cc: Willette Alma, MD and The Patient   PATIENT NAME:  Albert [name], Albert [name] MR#: [id number]

## 2013-10-22 NOTE — Progress Notes (Signed)
Lidocaine-40mg IV prior to Propofol InductionPropofol given over incremental dosages 

## 2013-10-22 NOTE — Patient Instructions (Signed)
Discharge instructions given with verbal understanding. Handouts on polyps and diverticulosis. Resume previous medications. YOU HAD AN ENDOSCOPIC PROCEDURE TODAY AT THE [facility]: Refer to the procedure report that was given to you for any specific questions about what was found during the examination.  If the procedure report does not answer your questions, please call your gastroenterologist to clarify.  If you requested that your care partner not be given the details of your procedure findings, then the procedure report has been included in a sealed envelope for you to review at your convenience later.  YOU SHOULD EXPECT: Some feelings of bloating in the abdomen. Passage of more gas than usual.  Walking can help get rid of the air that was put into your GI tract during the procedure and reduce the bloating. If you had a lower endoscopy (such as a colonoscopy or flexible sigmoidoscopy) you may notice spotting of blood in your stool or on the toilet paper. If you underwent a bowel prep for your procedure, then you may not have a normal bowel movement for a few days.  DIET: Your first meal following the procedure should be a light meal and then it is ok to progress to your normal diet.  A half-sandwich or bowl of soup is an example of a good first meal.  Heavy or fried foods are harder to digest and may make you feel nauseous or bloated.  Likewise meals heavy in dairy and vegetables can cause extra gas to form and this can also increase the bloating.  Drink plenty of fluids but you should avoid alcoholic beverages for 24 hours.  ACTIVITY: Your care partner should take you home directly after the procedure.  You should plan to take it easy, moving slowly for the rest of the day.  You can resume normal activity the day after the procedure however you should NOT DRIVE or use heavy machinery for 24 hours (because of the sedation medicines used during the test).    SYMPTOMS TO REPORT  IMMEDIATELY: A gastroenterologist can be reached at any hour.  During normal business hours, 8:30 AM to 5:00 PM Monday through Friday, call [phone].  After hours and on weekends, please call the GI answering service at [phone] who will take a message and have the physician on call contact you.   Following lower endoscopy (colonoscopy or flexible sigmoidoscopy):  Excessive amounts of blood in the stool  Significant tenderness or worsening of abdominal pains  Swelling of the abdomen that is new, acute  Fever of 100F or higher  FOLLOW UP: If any biopsies were taken you will be contacted by phone or by letter within the next 1-3 weeks.  Call your gastroenterologist if you have not heard about the biopsies in 3 weeks.  Our staff will call the home number listed on your records the next business day following your procedure to check on you and address any questions or concerns that you may have at that time regarding the information given to you following your procedure. This is a courtesy call and so if there is no answer at the home number and we have not heard from you through the emergency physician on call, we will assume that you have returned to your regular daily activities without incident.  SIGNATURES/CONFIDENTIALITY: You and/or your care partner have signed paperwork which will be entered into your electronic medical record.  These signatures attest to the fact that that the information above on your After Visit Summary   has been reviewed and is understood.  Full responsibility of the confidentiality of this discharge information lies with you and/or your care-partner. 

## 2013-10-22 NOTE — Progress Notes (Signed)
Patient denies any allergies to eggs or soy. Patient denies any problems with anesthesia.  

## 2013-10-22 NOTE — Progress Notes (Signed)
Called to room to assist during endoscopic procedure.  Patient ID and intended procedure confirmed with present staff. Received instructions for my participation in the procedure from the performing physician.  

## 2013-10-23 ENCOUNTER — Telehealth: Payer: Self-pay | Admitting: *Deleted

## 2013-10-23 ENCOUNTER — Other Ambulatory Visit: Payer: Self-pay | Admitting: Family Medicine

## 2013-10-23 NOTE — Telephone Encounter (Signed)
No answer, left message to call if questions or concerns. 

## 2013-10-24 ENCOUNTER — Encounter: Payer: Self-pay | Admitting: Family Medicine

## 2013-10-24 ENCOUNTER — Telehealth: Payer: Self-pay | Admitting: Family Medicine

## 2013-10-24 ENCOUNTER — Ambulatory Visit (INDEPENDENT_AMBULATORY_CARE_PROVIDER_SITE_OTHER): Payer: 59 | Admitting: Family Medicine

## 2013-10-24 VITALS — BP 138/84 | HR 87 | Temp 97.8°F | Ht 72.0 in | Wt 194.0 lb

## 2013-10-24 DIAGNOSIS — E785 Hyperlipidemia, unspecified: Secondary | ICD-10-CM

## 2013-10-24 DIAGNOSIS — G4700 Insomnia, unspecified: Secondary | ICD-10-CM

## 2013-10-24 DIAGNOSIS — E1165 Type 2 diabetes mellitus with hyperglycemia: Secondary | ICD-10-CM

## 2013-10-24 DIAGNOSIS — IMO0001 Reserved for inherently not codable concepts without codable children: Secondary | ICD-10-CM

## 2013-10-24 DIAGNOSIS — M542 Cervicalgia: Secondary | ICD-10-CM

## 2013-10-24 DIAGNOSIS — I10 Essential (primary) hypertension: Secondary | ICD-10-CM

## 2013-10-24 DIAGNOSIS — S161XXA Strain of muscle, fascia and tendon at neck level, initial encounter: Secondary | ICD-10-CM

## 2013-10-24 DIAGNOSIS — K5790 Diverticulosis of intestine, part unspecified, without perforation or abscess without bleeding: Secondary | ICD-10-CM

## 2013-10-24 DIAGNOSIS — S139XXA Sprain of joints and ligaments of unspecified parts of neck, initial encounter: Secondary | ICD-10-CM

## 2013-10-24 DIAGNOSIS — K5730 Diverticulosis of large intestine without perforation or abscess without bleeding: Secondary | ICD-10-CM

## 2013-10-24 MED ORDER — CARISOPRODOL 350 MG PO TABS: 350.0000 mg | ORAL_TABLET | Freq: Two times a day (BID) | ORAL | Status: DC | PRN

## 2013-10-24 MED ORDER — ZOLPIDEM TARTRATE 10 MG PO TABS: 10.0000 mg | ORAL_TABLET | Freq: Every evening | ORAL | Status: DC | PRN

## 2013-10-24 MED ORDER — MELOXICAM 15 MG PO TABS: 15.0000 mg | ORAL_TABLET | Freq: Every day | ORAL | Status: DC

## 2013-10-24 NOTE — Patient Instructions (Signed)
Cervical Sprain A cervical sprain is an injury in the neck in which the strong, fibrous tissues (ligaments) that connect your neck bones stretch or tear. Cervical sprains can range from mild to severe. Severe cervical sprains can cause the neck vertebrae to be unstable. This can lead to damage of the spinal cord and can result in serious nervous system problems. The amount of time it takes for a cervical sprain to get better depends on the cause and extent of the injury. Most cervical sprains heal in 1 to 3 weeks. CAUSES  Severe cervical sprains may be caused by:   Contact sport injuries (such as from football, rugby, wrestling, hockey, auto racing, gymnastics, diving, martial arts, or boxing).   Motor vehicle collisions.   Whiplash injuries. This is an injury from a sudden forward-and backward whipping movement of the head and neck.  Falls.  Mild cervical sprains may be caused by:   Being in an awkward position, such as while cradling a telephone between your ear and shoulder.   Sitting in a chair that does not offer proper support.   Working at a poorly designed computer station.   Looking up or down for long periods of time.  SYMPTOMS   Pain, soreness, stiffness, or a burning sensation in the front, back, or sides of the neck. This discomfort may develop immediately after the injury or slowly, 24 hours or more after the injury.   Pain or tenderness directly in the middle of the back of the neck.   Shoulder or upper back pain.   Limited ability to move the neck.   Headache.   Dizziness.   Weakness, numbness, or tingling in the hands or arms.   Muscle spasms.   Difficulty swallowing or chewing.   Tenderness and swelling of the neck.  DIAGNOSIS  Most of the time your health care provider can diagnose a cervical sprain by taking your history and doing a physical exam. Your health care provider will ask about previous neck injuries and any known neck  problems, such as arthritis in the neck. X-rays may be taken to find out if there are any other problems, such as with the bones of the neck. Other tests, such as a CT scan or MRI, may also be needed.  TREATMENT  Treatment depends on the severity of the cervical sprain. Mild sprains can be treated with rest, keeping the neck in place (immobilization), and pain medicines. Severe cervical sprains are immediately immobilized. Further treatment is done to help with pain, muscle spasms, and other symptoms and may include:  Medicines, such as pain relievers, numbing medicines, or muscle relaxants.   Physical therapy. This may involve stretching exercises, strengthening exercises, and posture training. Exercises and improved posture can help stabilize the neck, strengthen muscles, and help stop symptoms from returning.  HOME CARE INSTRUCTIONS   Put ice on the injured area.   Put ice in a plastic bag.   Place a towel between your skin and the bag.   Leave the ice on for 15 20 minutes, 3 4 times a day.   If your injury was severe, you may have been given a cervical collar to wear. A cervical collar is a two-piece collar designed to keep your neck from moving while it heals.  Do not remove the collar unless instructed by your health care provider.  If you have long hair, keep it outside of the collar.  Ask your health care provider before making any adjustments to your collar.   Minor adjustments may be required over time to improve comfort and reduce pressure on your chin or on the back of your head.  Ifyou are allowed to remove the collar for cleaning or bathing, follow your health care provider's instructions on how to do so safely.  Keep your collar clean by wiping it with mild soap and water and drying it completely. If the collar you have been given includes removable pads, remove them every 1 2 days and hand wash them with soap and water. Allow them to air dry. They should be completely  dry before you wear them in the collar.  If you are allowed to remove the collar for cleaning and bathing, wash and dry the skin of your neck. Check your skin for irritation or sores. If you see any, tell your health care provider.  Do not drive while wearing the collar.   Only take over-the-counter or prescription medicines for pain, discomfort, or fever as directed by your health care provider.   Keep all follow-up appointments as directed by your health care provider.   Keep all physical therapy appointments as directed by your health care provider.   Make any needed adjustments to your workstation to promote good posture.   Avoid positions and activities that make your symptoms worse.   Warm up and stretch before being active to help prevent problems.  SEEK MEDICAL CARE IF:   Your pain is not controlled with medicine.   You are unable to decrease your pain medicine over time as planned.   Your activity level is not improving as expected.  SEEK IMMEDIATE MEDICAL CARE IF:   You develop any bleeding.  You develop stomach upset.  You have signs of an allergic reaction to your medicine.   Your symptoms get worse.   You develop new, unexplained symptoms.   You have numbness, tingling, weakness, or paralysis in any part of your body.  MAKE SURE YOU:   Understand these instructions.  Will watch your condition.  Will get help right away if you are not doing well or get worse. Document Released: 05/28/2007 Document Revised: 05/21/2013 Document Reviewed: 02/05/2013 [employer] Patient Information 2014 [employer].  

## 2013-10-24 NOTE — Telephone Encounter (Signed)
Refill- zolpidem °

## 2013-10-24 NOTE — Progress Notes (Signed)
[name], male   DOB: 03-16-1956, 58 y.o.   MRN: [id number] APOLO CUTSHAW [id number] 10-Jan-1956 10/24/2013      Progress Note-Follow Up  Subjective  Chief Complaint  Chief Complaint  Patient presents with  . Follow-up    6 week    HPI  Patient is a 58 year old male in today for routine medical care. Struggling with intermittent neck pain, does very strenuous work. No radicular symptoms. Reports blood sugars in low 200s. No polyuria or polydipsia. Denies CP/palp/SOB/HA/congestion/fevers/GI or GU c/o. Taking meds as prescribed  Past Medical History  Diagnosis Date  . Diabetes mellitus   . Hypertension   . History of chicken pox     childhood  . Seasonal allergies   . History of kidney stones   . Hyperlipidemia   . 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 Sciatica of right side 12/22/2012  . Insomnia 02/25/2013  . Abdominal pain, unspecified site 03/11/2013    Left side at site of excision   . Diarrhea 03/11/2013  . Acute neck pain 06/04/2013    Past Surgical History  Procedure Laterality Date  . Hernia repair    . Abdominal hernia repair    . Rotator cuff repair    . Cholecystectomy    . Mass excision  05/22/2012    Procedure: EXCISION MASS;  Surgeon: Joyice Faster. Cornett, MD;  Location: [facility];  Service: General;  Laterality: Left;  excision abdominal wall mass    Family History  Problem Relation Age of Onset  . Lung cancer Mother   . Breast cancer Neg Hx   . Colon cancer Neg Hx   . Prostate cancer Neg Hx   . Heart disease Neg Hx   . Diabetes Maternal Aunt   . Cancer Paternal Grandmother     History   Social History  . Marital Status: Married    Spouse Name: N/A    Number of Children: N/A  . Years of Education: N/A   Occupational History  . Not on file.   Social History Main Topics  . Smoking status: Never Smoker   . Smokeless tobacco: Never Used  . Alcohol Use: No  . Drug Use: No  .  Sexual Activity: Not on file   Other Topics Concern  . Not on file   Social History Narrative  . No narrative on file    Current Outpatient Prescriptions on File Prior to Visit  Medication Sig Dispense Refill  . aspirin EC 81 MG tablet Take 81 mg by mouth daily.      Marland Kitchen atorvastatin (LIPITOR) 20 MG tablet TAKE 1 TABLET BY MOUTH DAILY.  30 tablet  6  . glucose blood (ONE TOUCH ULTRA TEST) test strip Use as instructed  100 each  2  . Ibuprofen 200 MG CAPS Take 3 capsules by mouth as needed.      Marland Kitchen JANUVIA 100 MG tablet TAKE 1 TABLET BY MOUTH ONCE DAILY  30 tablet  1  . lisinopril (PRINIVIL,ZESTRIL) 10 MG tablet Take 1 tablet (10 mg total) by mouth 2 (two) times daily.  180 tablet  1  . metFORMIN (GLUCOPHAGE-XR) 500 MG 24 hr tablet TAKE 4 TABLETS BY MOUTH DAILY AS TOLERATED  120 tablet  5  . Probiotic Product (PROBIOTIC DAILY PO) Take by mouth. AS DIRECTED      . saccharomyces boulardii (FLORASTOR) 250 MG capsule Take 1 capsule (250  mg total) by mouth 2 (two) times daily.  40 capsule  0  . VIAGRA 100 MG tablet TAKE 1/2 TO 1 TABLET BY MOUTH DAILY AS NEEDED FOR ERECTILE DYSFUNCTION  9 tablet  2  . zolpidem (AMBIEN) 10 MG tablet Take 1 tablet (10 mg total) by mouth at bedtime as needed for sleep.  20 tablet  1   No current facility-administered medications on file prior to visit.    Allergies  Allergen Reactions  . Contrast Media [Iodinated Diagnostic Agents] Swelling  . Iodine Swelling    Review of Systems  Review of Systems  Constitutional: Positive for malaise/fatigue. Negative for fever.  HENT: Negative for congestion.   Eyes: Negative for discharge.  Respiratory: Negative for shortness of breath.   Cardiovascular: Negative for chest pain, palpitations and leg swelling.  Gastrointestinal: Negative for nausea, abdominal pain and diarrhea.  Genitourinary: Negative for dysuria.  Musculoskeletal: Positive for neck pain. Negative for falls.       Neck pain   Skin: Negative for  rash.  Neurological: Negative for loss of consciousness and headaches.  Endo/Heme/Allergies: Negative for polydipsia.  Psychiatric/Behavioral: Negative for depression and suicidal ideas. The patient is not nervous/anxious and does not have insomnia.     Objective  BP 138/84  Pulse 87  Temp(Src) 97.8 F (36.6 C) (Oral)  Ht 6' (1.829 m)  Wt 194 lb (87.998 kg)  BMI 26.31 kg/m2  SpO2 97%  Physical Exam  Physical Exam  Constitutional: He is oriented to person, place, and time and well-developed, well-nourished, and in no distress. No distress.  HENT:  Head: Normocephalic and atraumatic.  Eyes: Conjunctivae are normal.  Neck: Neck supple. No thyromegaly present.  Cardiovascular: Normal rate, regular rhythm and normal heart sounds.   No murmur heard. Pulmonary/Chest: Effort normal and breath sounds normal. No respiratory distress.  Abdominal: He exhibits no distension and no mass. There is no tenderness.  Genitourinary: No discharge found.  Musculoskeletal: He exhibits no edema.  Neurological: He is alert and oriented to person, place, and time.  Skin: Skin is warm.  Psychiatric: Memory, affect and judgment normal.    Lab Results  Component Value Date   TSH 3.144 09/12/2013   Lab Results  Component Value Date   WBC 7.4 09/12/2013   HGB 14.8 09/12/2013   HCT 42.1 09/12/2013   MCV 85.2 09/12/2013   PLT 248 09/12/2013   Lab Results  Component Value Date   CREATININE 0.76 09/12/2013   BUN 15 09/12/2013   NA 136 09/12/2013   K 4.4 09/12/2013   CL 100 09/12/2013   CO2 25 09/12/2013   Lab Results  Component Value Date   ALT 18 09/12/2013   AST 16 09/12/2013   ALKPHOS 61 09/12/2013   BILITOT 0.6 09/12/2013   Lab Results  Component Value Date   CHOL 121 02/21/2013   Lab Results  Component Value Date   HDL 38* 02/21/2013   Lab Results  Component Value Date   LDLCALC 63 02/21/2013   Lab Results  Component Value Date   TRIG 99 02/21/2013   Lab Results  Component Value Date    CHOLHDL 3.2 02/21/2013     Assessment & Plan  Type II or unspecified type diabetes mellitus without mention of complication, uncontrolled Sugars in low 200s recently. Continue current meds and monitor  Other and unspecified hyperlipidemia Tolerating statin, encouraged heart healthy diet, avoid trans fats, minimize simple carbs and saturated fats. Increase exercise as tolerated  HTN (hypertension) Well  controlled, no changes to meds. Encouraged heart healthy diet such as the DASH diet and exercise as tolerated.   Diverticulosis encouraged high fiber diet with increased hdyration and probiotics.  Acute neck pain Encouraged moist heat and gentle stretching. Meloxicam prn with food.  Insomnia Does shift work may use Ambien prn. Encouraged good sleep hygiene such as dark, quiet room. No blue/green glowing lights such as computer screens in bedroom. No alcohol or stimulants in evening. Cut down on caffeine as able. Regular exercise is helpful but not just prior to bed time.

## 2013-10-24 NOTE — Progress Notes (Signed)
Pre visit review using our clinic review tool, if applicable. No additional management support is needed unless otherwise documented below in the visit note. 

## 2013-10-24 NOTE — Telephone Encounter (Signed)
This RX was printed for md to sign and then we will fax

## 2013-10-26 ENCOUNTER — Encounter: Payer: Self-pay | Admitting: Family Medicine

## 2013-10-26 DIAGNOSIS — K5790 Diverticulosis of intestine, part unspecified, without perforation or abscess without bleeding: Secondary | ICD-10-CM

## 2013-10-26 HISTORY — DX: Diverticulosis of intestine, part unspecified, without perforation or abscess without bleeding: K57.90

## 2013-10-26 NOTE — Assessment & Plan Note (Signed)
Encouraged moist heat and gentle stretching. Meloxicam prn with food.

## 2013-10-26 NOTE — Assessment & Plan Note (Signed)
encouraged high fiber diet with increased hdyration and probiotics.

## 2013-10-26 NOTE — Assessment & Plan Note (Signed)
Sugars in low 200s recently. Continue current meds and monitor

## 2013-10-26 NOTE — Assessment & Plan Note (Signed)
Well controlled, no changes to meds. Encouraged heart healthy diet such as the DASH diet and exercise as tolerated.  °

## 2013-10-26 NOTE — Assessment & Plan Note (Signed)
Tolerating statin, encouraged heart healthy diet, avoid trans fats, minimize simple carbs and saturated fats. Increase exercise as tolerated 

## 2013-10-26 NOTE — Assessment & Plan Note (Signed)
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 Encouraged good sleep hygiene such as dark, quiet room. No blue/green glowing lights such as computer screens in bedroom. No alcohol or stimulants in evening. Cut down on caffeine as able. Regular exercise is helpful but not just prior to bed time.

## 2013-10-27 ENCOUNTER — Telehealth: Payer: Self-pay | Admitting: Family Medicine

## 2013-10-27 ENCOUNTER — Encounter: Payer: Self-pay | Admitting: Internal Medicine

## 2013-10-27 NOTE — Telephone Encounter (Signed)
No answer, left message to call if questions or concerns. 

## 2013-10-27 NOTE — Telephone Encounter (Signed)
Relevant patient education assigned to patient using Emmi. ° °

## 2013-10-30 ENCOUNTER — Encounter: Payer: Self-pay | Admitting: Family Medicine

## 2013-10-30 ENCOUNTER — Other Ambulatory Visit: Payer: Self-pay | Admitting: Family Medicine

## 2013-10-30 ENCOUNTER — Other Ambulatory Visit: Payer: Self-pay | Admitting: Physician Assistant

## 2013-10-30 DIAGNOSIS — B379 Candidiasis, unspecified: Secondary | ICD-10-CM

## 2013-10-30 MED ORDER — FLUCONAZOLE 150 MG PO TABS: ORAL_TABLET | ORAL | Status: DC

## 2013-11-10 ENCOUNTER — Encounter: Payer: Self-pay | Admitting: Family Medicine

## 2013-11-10 NOTE — Telephone Encounter (Signed)
FYI:  I updated family medical history

## 2013-11-23 ENCOUNTER — Encounter (HOSPITAL_COMMUNITY): Payer: Self-pay | Admitting: Emergency Medicine

## 2013-11-23 ENCOUNTER — Emergency Department (HOSPITAL_COMMUNITY)
Admission: EM | Admit: 2013-11-23 | Discharge: 2013-11-24 | Disposition: A | Discharge status: Home / Self Care | Payer: 59 | Attending: Emergency Medicine | Admitting: Emergency Medicine

## 2013-11-23 DIAGNOSIS — I10 Essential (primary) hypertension: Secondary | ICD-10-CM | POA: Insufficient documentation

## 2013-11-23 DIAGNOSIS — Z8619 Personal history of other infectious and parasitic diseases: Secondary | ICD-10-CM | POA: Insufficient documentation

## 2013-11-23 DIAGNOSIS — E785 Hyperlipidemia, unspecified: Secondary | ICD-10-CM | POA: Insufficient documentation

## 2013-11-23 DIAGNOSIS — Z85828 Personal history of other malignant neoplasm of skin: Secondary | ICD-10-CM | POA: Insufficient documentation

## 2013-11-23 DIAGNOSIS — E119 Type 2 diabetes mellitus without complications: Secondary | ICD-10-CM | POA: Insufficient documentation

## 2013-11-23 DIAGNOSIS — R1032 Left lower quadrant pain: Secondary | ICD-10-CM | POA: Insufficient documentation

## 2013-11-23 DIAGNOSIS — Z79899 Other long term (current) drug therapy: Secondary | ICD-10-CM | POA: Insufficient documentation

## 2013-11-23 DIAGNOSIS — G4700 Insomnia, unspecified: Secondary | ICD-10-CM | POA: Insufficient documentation

## 2013-11-23 DIAGNOSIS — Z87442 Personal history of urinary calculi: Secondary | ICD-10-CM | POA: Insufficient documentation

## 2013-11-23 DIAGNOSIS — Z9889 Other specified postprocedural states: Secondary | ICD-10-CM | POA: Insufficient documentation

## 2013-11-23 NOTE — ED Notes (Signed)
The pt is c/o lt lat abd pain  Intermittently since October.  He has had multiple testa and labd with no diagnosis

## 2013-11-24 ENCOUNTER — Emergency Department (HOSPITAL_COMMUNITY): Payer: 59

## 2013-11-24 ENCOUNTER — Encounter (HOSPITAL_COMMUNITY): Payer: Self-pay | Admitting: Radiology

## 2013-11-24 LAB — COMPREHENSIVE METABOLIC PANEL
ALT: 17 U/L (ref 0–53)
AST: 18 U/L (ref 0–37)
Albumin: 4.3 g/dL (ref 3.5–5.2)
Alkaline Phosphatase: 69 U/L (ref 39–117)
BUN: 12 mg/dL (ref 6–23)
CALCIUM: 9.9 mg/dL (ref 8.4–10.5)
CO2: 23 meq/L (ref 19–32)
Chloride: 97 mEq/L (ref 96–112)
Creatinine, Ser: 0.76 mg/dL (ref 0.50–1.35)
GLUCOSE: 131 mg/dL — AB (ref 70–99)
Potassium: 3.5 mEq/L — ABNORMAL LOW (ref 3.7–5.3)
Sodium: 136 mEq/L — ABNORMAL LOW (ref 137–147)
TOTAL PROTEIN: 8.1 g/dL (ref 6.0–8.3)
Total Bilirubin: 0.6 mg/dL (ref 0.3–1.2)

## 2013-11-24 LAB — CBC WITH DIFFERENTIAL/PLATELET
Basophils Absolute: 0.1 10*3/uL (ref 0.0–0.1)
Basophils Relative: 1 % (ref 0–1)
EOS ABS: 0.4 10*3/uL (ref 0.0–0.7)
Eosinophils Relative: 5 % (ref 0–5)
HCT: 42.6 % (ref 39.0–52.0)
HEMOGLOBIN: 15.4 g/dL (ref 13.0–17.0)
LYMPHS ABS: 3.5 10*3/uL (ref 0.7–4.0)
Lymphocytes Relative: 40 % (ref 12–46)
MCH: 31.4 pg (ref 26.0–34.0)
MCHC: 36.2 g/dL — ABNORMAL HIGH (ref 30.0–36.0)
MCV: 86.9 fL (ref 78.0–100.0)
MONO ABS: 0.8 10*3/uL (ref 0.1–1.0)
Monocytes Relative: 9 % (ref 3–12)
Neutro Abs: 4 10*3/uL (ref 1.7–7.7)
Neutrophils Relative %: 45 % (ref 43–77)
PLATELETS: 224 10*3/uL (ref 150–400)
RBC: 4.9 MIL/uL (ref 4.22–5.81)
RDW: 13.2 % (ref 11.5–15.5)
WBC: 8.8 10*3/uL (ref 4.0–10.5)

## 2013-11-24 LAB — URINALYSIS, ROUTINE W REFLEX MICROSCOPIC
BILIRUBIN URINE: NEGATIVE
Glucose, UA: NEGATIVE mg/dL
HGB URINE DIPSTICK: NEGATIVE
Ketones, ur: NEGATIVE mg/dL
Leukocytes, UA: NEGATIVE
Nitrite: NEGATIVE
Protein, ur: NEGATIVE mg/dL
Specific Gravity, Urine: 1.015 (ref 1.005–1.030)
UROBILINOGEN UA: 0.2 mg/dL (ref 0.0–1.0)
pH: 5 (ref 5.0–8.0)

## 2013-11-24 LAB — LIPASE, BLOOD: LIPASE: 19 U/L (ref 11–59)

## 2013-11-24 MED ORDER — GABAPENTIN 300 MG PO CAPS
300.0000 mg | ORAL_CAPSULE | Freq: Once | ORAL | Status: AC
  Administered 2013-11-24: 300 mg via ORAL
  Filled 2013-11-24: qty 1

## 2013-11-24 MED ORDER — MORPHINE SULFATE 4 MG/ML IJ SOLN
4.0000 mg | Freq: Once | INTRAMUSCULAR | Status: AC
Start: 2013-11-24 — End: 2013-11-24
  Administered 2013-11-24: 4 mg via INTRAVENOUS
  Filled 2013-11-24 (×2): qty 1

## 2013-11-24 MED ORDER — GABAPENTIN 300 MG PO CAPS: 300.0000 mg | ORAL_CAPSULE | Freq: Three times a day (TID) | ORAL | Status: DC

## 2013-11-24 MED ORDER — DIPHENHYDRAMINE HCL 50 MG/ML IJ SOLN
25.0000 mg | Freq: Once | INTRAMUSCULAR | Status: DC
  Filled 2013-11-24: qty 1

## 2013-11-24 MED ORDER — OXYCODONE-ACETAMINOPHEN 5-325 MG PO TABS: 2.0000 | ORAL_TABLET | ORAL | Status: DC | PRN

## 2013-11-24 NOTE — ED Notes (Signed)
Patient transported to CT 

## 2013-11-24 NOTE — ED Notes (Signed)
Per radiologist in CT, pt cannot be just premedicated with benadryl prior to CT scan due to hx of allergic reaction to scan.  EDP ok with pt drinking CT contrast for CT scan.

## 2013-11-24 NOTE — ED Notes (Signed)
Pt not wanting morphine due to having to drive home if he is to be discharged.  Pt ok with neurontin.

## 2013-11-24 NOTE — Discharge Instructions (Signed)
Your workup today has not shown a specific cause for your pain, but it seems to be located over the area of your old scar.  Take medications as prescribed.  Follow up with your surgeon and your primary care doctor.  Return to the ER for worsening condition or new concerning symptoms.   Pain of Unknown Etiology (Pain Without a Known Cause) You have come to your caregiver because of pain. Pain can occur in any part of the body. Often there is not a definite cause. If your laboratory (blood or urine) work was normal and X-rays or other studies were normal, your caregiver may treat you without knowing the cause of the pain. An example of this is the headache. Most headaches are diagnosed by taking a history. This means your caregiver asks you questions about your headaches. Your caregiver determines a treatment based on your answers. Usually testing done for headaches is normal. Often testing is not done unless there is no response to medications. Regardless of where your pain is located today, you can be given medications to make you comfortable. If no physical cause of pain can be found, most cases of pain will gradually leave as suddenly as they came.  If you have a painful condition and no reason can be found for the pain, it is important that you follow up with your caregiver. If the pain becomes worse or does not go away, it may be necessary to repeat tests and look further for a possible cause.  Only take over-the-counter or prescription medicines for pain, discomfort, or fever as directed by your caregiver.  For the protection of your privacy, test results cannot be given over the phone. Make sure you receive the results of your test. Ask how these results are to be obtained if you have not been informed. It is your responsibility to obtain your test results.  You may continue all activities unless the activities cause more pain. When the pain lessens, it is important to gradually resume normal  activities. Resume activities by beginning slowly and gradually increasing the intensity and duration of the activities or exercise. During periods of severe pain, bed rest may be helpful. Lie or sit in any position that is comfortable.  Ice used for acute (sudden) conditions may be effective. Use a large plastic bag filled with ice and wrapped in a towel. This may provide pain relief.  See your caregiver for continued problems. Your caregiver can help or refer you for exercises or physical therapy if necessary. If you were given medications for your condition, do not drive, operate machinery or power tools, or sign legal documents for 24 hours. Do not drink alcohol, take sleeping pills, or take other medications that may interfere with treatment. See your caregiver immediately if you have pain that is becoming worse and not relieved by medications. Document Released: 04/25/2001 Document Revised: 05/21/2013 Document Reviewed: 07/31/2005 [facility] Patient Information 2014 [place].
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
## 2013-11-24 NOTE — ED Notes (Signed)
Returned from ct scan 

## 2013-12-01 ENCOUNTER — Encounter: Payer: Self-pay | Admitting: Family Medicine

## 2013-12-03 ENCOUNTER — Other Ambulatory Visit: Payer: Self-pay | Admitting: Family Medicine

## 2013-12-03 DIAGNOSIS — R109 Unspecified abdominal pain: Secondary | ICD-10-CM

## 2013-12-03 DIAGNOSIS — N200 Calculus of kidney: Secondary | ICD-10-CM

## 2013-12-04 ENCOUNTER — Encounter: Payer: Self-pay | Admitting: Family Medicine

## 2013-12-04 ENCOUNTER — Other Ambulatory Visit: Payer: Self-pay | Admitting: Family Medicine

## 2013-12-04 MED ORDER — GABAPENTIN 300 MG PO CAPS: 600.0000 mg | ORAL_CAPSULE | Freq: Three times a day (TID) | ORAL | Status: DC

## 2013-12-09 ENCOUNTER — Telehealth: Payer: Self-pay | Admitting: *Deleted

## 2013-12-09 DIAGNOSIS — E785 Hyperlipidemia, unspecified: Secondary | ICD-10-CM

## 2013-12-09 DIAGNOSIS — E1165 Type 2 diabetes mellitus with hyperglycemia: Principal | ICD-10-CM

## 2013-12-09 DIAGNOSIS — IMO0001 Reserved for inherently not codable concepts without codable children: Secondary | ICD-10-CM

## 2013-12-09 DIAGNOSIS — I10 Essential (primary) hypertension: Secondary | ICD-10-CM

## 2013-12-09 LAB — RENAL FUNCTION PANEL
ALBUMIN: 4.1 g/dL (ref 3.5–5.2)
BUN: 12 mg/dL (ref 6–23)
CALCIUM: 9.5 mg/dL (ref 8.4–10.5)
CO2: 26 mEq/L (ref 19–32)
Chloride: 102 mEq/L (ref 96–112)
Creat: 0.74 mg/dL (ref 0.50–1.35)
Glucose, Bld: 159 mg/dL — ABNORMAL HIGH (ref 70–99)
Phosphorus: 4 mg/dL (ref 2.3–4.6)
Potassium: 4.1 mEq/L (ref 3.5–5.3)
SODIUM: 139 meq/L (ref 135–145)

## 2013-12-09 LAB — HEPATIC FUNCTION PANEL
ALT: 14 U/L (ref 0–53)
AST: 13 U/L (ref 0–37)
Albumin: 4.1 g/dL (ref 3.5–5.2)
Alkaline Phosphatase: 60 U/L (ref 39–117)
Bilirubin, Direct: 0.1 mg/dL (ref 0.0–0.3)
Indirect Bilirubin: 0.4 mg/dL (ref 0.2–1.2)
Total Bilirubin: 0.5 mg/dL (ref 0.2–1.2)
Total Protein: 6.7 g/dL (ref 6.0–8.3)

## 2013-12-09 LAB — CBC
HCT: 39.9 % (ref 39.0–52.0)
HEMOGLOBIN: 13.8 g/dL (ref 13.0–17.0)
MCH: 29.9 pg (ref 26.0–34.0)
MCHC: 34.6 g/dL (ref 30.0–36.0)
MCV: 86.4 fL (ref 78.0–100.0)
Platelets: 216 10*3/uL (ref 150–400)
RBC: 4.62 MIL/uL (ref 4.22–5.81)
RDW: 14 % (ref 11.5–15.5)
WBC: 6.7 10*3/uL (ref 4.0–10.5)

## 2013-12-09 LAB — HEMOGLOBIN A1C
Hgb A1c MFr Bld: 8.3 % — ABNORMAL HIGH (ref <5.7)
Mean Plasma Glucose: 192 mg/dL — ABNORMAL HIGH (ref <117)

## 2013-12-09 LAB — LIPID PANEL
CHOLESTEROL: 102 mg/dL (ref 0–200)
HDL: 30 mg/dL — AB (ref >39)
LDL CALC: 47 mg/dL (ref 0–99)
TRIGLYCERIDES: 125 mg/dL (ref <150)
Total CHOL/HDL Ratio: 3.4 Ratio
VLDL: 25 mg/dL (ref 0–40)

## 2013-12-09 LAB — TSH: TSH: 2.045 u[IU]/mL (ref 0.350–4.500)

## 2013-12-09 NOTE — Telephone Encounter (Signed)
Pt presented to the lab. Lab orders entered per 3/13 office note as below:  Return in about 6 weeks (around 12/05/2013) for follow up DM, HTN, hi chol, labs prior, lipid, renal, cbc, tsh, hepatic, hgba1c prior.

## 2013-12-10 ENCOUNTER — Ambulatory Visit (INDEPENDENT_AMBULATORY_CARE_PROVIDER_SITE_OTHER): Payer: 59 | Admitting: Internal Medicine

## 2013-12-10 ENCOUNTER — Encounter: Payer: Self-pay | Admitting: Internal Medicine

## 2013-12-10 VITALS — BP 116/70 | HR 96 | Ht 70.5 in | Wt 195.5 lb

## 2013-12-10 DIAGNOSIS — R141 Gas pain: Secondary | ICD-10-CM

## 2013-12-10 DIAGNOSIS — R142 Eructation: Secondary | ICD-10-CM

## 2013-12-10 DIAGNOSIS — R197 Diarrhea, unspecified: Secondary | ICD-10-CM

## 2013-12-10 DIAGNOSIS — R143 Flatulence: Secondary | ICD-10-CM

## 2013-12-10 DIAGNOSIS — R1032 Left lower quadrant pain: Secondary | ICD-10-CM

## 2013-12-10 MED ORDER — DIPHENOXYLATE-ATROPINE 2.5-0.025 MG PO TABS: ORAL_TABLET | ORAL | Status: DC

## 2013-12-10 NOTE — Progress Notes (Signed)
HISTORY OF PRESENT ILLNESS:  Albert [name] is a 58 y.o. male with long-standing diabetes mellitus, hypertension, and kidney stones who presents today for followup regarding chronic diarrhea. Patient reports problems with diarrhea dating back to last fall. He was initially seen in this office by the physician assistant 09/26/2013. He has undergone multiple stool studies, Gen. laboratories, and celiac testing. These have been unremarkable. Empiric treatment with metronidazole and Imodium have not helped. He underwent complete colonoscopy with intubation of the ileum 10/22/2013. The ileum was normal. Diminutive adenomas were removed. Mild diverticulosis present. Random colon biopsies normal. Recent hospital visit for abdominal pain found to be kidney stones. Of interest, he has metformin increased in the past year. He has had a decreased somewhat occasions without change in diarrhea. Never discontinued. He has had no further weight loss. Actually mild weight gain. No bleeding. No fevers. He is frustrated over his ongoing symptoms. May experience incontinence. Bowels are watery  REVIEW OF SYSTEMS:  All non-GI ROS negative except for kidney stones  Past Medical History  Diagnosis Date  . Diabetes mellitus   . Hypertension   . History of chicken pox     childhood  . Seasonal allergies   . History of kidney stones   . Hyperlipidemia   . 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 Sciatica of right side 12/22/2012  . Insomnia 02/25/2013  . Abdominal pain, unspecified site 03/11/2013    Left side at site of excision   . Diarrhea 03/11/2013  . Acute neck pain 06/04/2013  . Diverticulosis 10/26/2013  . Colon polyps     adenomatous    Past Surgical History  Procedure Laterality Date  . Hernia repair    . Abdominal hernia repair    . Rotator cuff repair    . Cholecystectomy    . Mass excision  05/22/2012    Procedure: EXCISION MASS;  Surgeon: Joyice Faster. Cornett, MD;   Location: [facility];  Service: General;  Laterality: Left;  excision abdominal wall mass    Social History Albert [name]  reports that he has never smoked. He has never used smokeless tobacco. He reports that he does not drink alcohol or use illicit drugs.  family history includes Cancer in his paternal grandmother; Diabetes in his maternal aunt; Lung cancer in his mother. There is no history of Breast cancer, Colon cancer, Prostate cancer, or Heart disease.  Allergies  Allergen Reactions  . Contrast Media [Iodinated Diagnostic Agents] Swelling    Can take if premedicated with diphenhydramine  . Iodine Swelling       PHYSICAL EXAMINATION: Vital signs: BP 116/70  Pulse 96  Ht 5' 10.5" (1.791 m)  Wt 195 lb 8 oz (88.678 kg)  BMI 27.65 kg/m2 General: Well-developed, well-nourished, no acute distress HEENT: Sclerae are anicteric, conjunctiva pink. Oral mucosa intact Lungs: Clear Heart: Regular Abdomen: soft, nontender, nondistended, no obvious ascites, no peritoneal signs, normal bowel sounds. No organomegaly. Extremities: No edema Psychiatric: alert and oriented x3. Cooperative   ASSESSMENT:  #1. Chronic diarrhea. Etiology unclear.? Metformin.? Diabetic diarrhea.? Other. #2. Multiple medical problems including long-standing diabetes #3. Adenomatous colon polyps  PLAN:  #1. Patient seen Dr. Charlett Blake tomorrow. I would like him to hold ALL metformin for 2 weeks to see if this helps. If not, we will expand his workup significantly. #2. Prescribed Lomotil, one to 2 every 4-6 hours when necessary #3. This will contact this office a couple  weeks to let me know if cessation of metformin was helpful. We will go from there

## 2013-12-10 NOTE — Patient Instructions (Signed)
We have sent the following medications to your pharmacy for you to pick up at your convenience:  Lomotil  Please call our office in a few weeks if you are still having diarrhea

## 2013-12-11 ENCOUNTER — Ambulatory Visit (INDEPENDENT_AMBULATORY_CARE_PROVIDER_SITE_OTHER): Payer: 59 | Admitting: Family Medicine

## 2013-12-11 ENCOUNTER — Encounter: Payer: Self-pay | Admitting: Family Medicine

## 2013-12-11 VITALS — BP 140/86 | HR 69 | Temp 97.8°F | Ht 72.0 in | Wt 190.1 lb

## 2013-12-11 DIAGNOSIS — N400 Enlarged prostate without lower urinary tract symptoms: Secondary | ICD-10-CM | POA: Insufficient documentation

## 2013-12-11 DIAGNOSIS — IMO0001 Reserved for inherently not codable concepts without codable children: Secondary | ICD-10-CM

## 2013-12-11 DIAGNOSIS — R197 Diarrhea, unspecified: Secondary | ICD-10-CM

## 2013-12-11 DIAGNOSIS — E119 Type 2 diabetes mellitus without complications: Secondary | ICD-10-CM

## 2013-12-11 DIAGNOSIS — E785 Hyperlipidemia, unspecified: Secondary | ICD-10-CM

## 2013-12-11 DIAGNOSIS — E1165 Type 2 diabetes mellitus with hyperglycemia: Secondary | ICD-10-CM

## 2013-12-11 DIAGNOSIS — I10 Essential (primary) hypertension: Secondary | ICD-10-CM

## 2013-12-11 MED ORDER — ATORVASTATIN CALCIUM 20 MG PO TABS: 20.0000 mg | ORAL_TABLET | Freq: Every day | ORAL | Status: DC

## 2013-12-11 MED ORDER — GLIMEPIRIDE 1 MG PO TABS: 1.0000 mg | ORAL_TABLET | Freq: Two times a day (BID) | ORAL | Status: DC

## 2013-12-11 MED ORDER — GLIMEPIRIDE 1 MG PO TABS
1.0000 mg | ORAL_TABLET | Freq: Every day | ORAL | Status: DC
Start: 2013-12-11 — End: 2013-12-11

## 2013-12-11 MED ORDER — TAMSULOSIN HCL 0.4 MG PO CAPS: 0.4000 mg | ORAL_CAPSULE | Freq: Every day | ORAL | Status: DC

## 2013-12-11 NOTE — Assessment & Plan Note (Signed)
Tolerating statin, encouraged heart healthy diet, avoid trans fats, minimize simple carbs and saturated fats. Increase exercise as tolerated 

## 2013-12-11 NOTE — Assessment & Plan Note (Signed)
Was seen yesterday by GI. He will hold the Metformin and he was given Lomotil to try, has taken one dose with some relief. Increase exercise and minimize simple carbs

## 2013-12-11 NOTE — Assessment & Plan Note (Signed)
Well controlled, no changes to meds. Encouraged heart healthy diet such as the DASH diet and exercise as tolerated.  °

## 2013-12-11 NOTE — Patient Instructions (Signed)
Start Glimepiride 1 mg once daily, then increase to twice daily as tolerated. Watch sugars twice daily. Drop Lisinopril to 1/2 tab twice daily if blood pressures drop with exercise or Tamulosin  Basic Carbohydrate Counting Basic carbohydrate counting is a way to plan meals. It is done by counting the amount of carbohydrate in foods. Foods that have carbohydrates are starches (grains, beans, starchy vegetables) and sweets. Eating carbohydrates increases blood glucose (sugar) levels. People with diabetes use carbohydrate counting to help keep their blood glucose at a normal level.  COUNTING CARBOHYDRATES IN FOODS The first step in counting carbohydrates is to learn how many carbohydrate servings you should have in every meal. A dietitian can plan this for you. After learning the amount of carbohydrates to include in your meal plan, you can start to choose the carbohydrate-containing foods you want to eat.  There are 2 ways to identify the amount of carbohydrates in the foods you eat.  Read the Nutrition Facts panel on food labels. You need 2 pieces of information from the Nutrition Facts panel to count carbohydrates this way:  Serving size.  Total carbohydrate (in grams). Decide how many servings you will be eating. If it is 1 serving, you will be eating the amount of carbohydrate listed on the panel. If you will be eating 2 servings, you will be eating double the amount of carbohydrate listed on the panel.   Learn serving sizes. A serving size of most carbohydrate-containing foods is about 15 grams (g). Listed below are single serving sizes of common carbohydrate-containing foods:  1 slice bread.   cup unsweetened, dry cereal.   cup hot cereal.   cup rice.   cup mashed potatoes.   cup pasta.  1 cup fresh fruit.   cup canned fruit.  1 cup milk (whole, 2%, or skim).   cup starchy vegetables (peas, corn, or potatoes). Counting carbohydrates this way is similar to looking on the  Nutrition Facts panel. Decide how many servings you will eat first. Multiply the number of servings you eat by 15 g. For example, if you have 2 cups of strawberries, you had 2 servings. That means you had 30 g of carbohydrate (2 servings x 15 g = 30 g). CALCULATING CARBOHYDRATES IN A MEAL Sample dinner  3 oz chicken breast.   cup brown rice.   cup corn.  1 cup fat-free milk.  1 cup strawberries with sugar-free whipped topping. Carbohydrate calculation First, identify the foods that contain carbohydrate:  Rice.  Corn.  Milk.  Strawberries. Calculate the number of servings eaten:  2 servings rice.  1 serving corn.  1 serving milk.  1 serving strawberries. Multiply the number of servings by 15 g:  2 servings rice x 15 g = 30 g.  1 serving corn x 15 g = 15 g.  1 serving milk x 15 g = 15 g.  1 serving strawberries x 15 g = 15 g. Add the amounts to find the total carbohydrates eaten: 30 g + 15 g + 15 g + 15 g = 75 g carbohydrate eaten at dinner. Document Released: 07/31/2005 Document Revised: 10/23/2011 Document Reviewed: 06/16/2011 [facility] Patient Information 2014 [place], [place].

## 2013-12-11 NOTE — Assessment & Plan Note (Signed)
hgba1c 8.3. Will need to hold Metformin to see if it helps the diarrhea. Will try Glimepiride 1 mg start with once daily and increase to bid as needed. Increase exercise

## 2013-12-11 NOTE — Progress Notes (Signed)
[name], male   DOB: [birth year], 58 y.o.   MRN: [id number] Albert [name] [id number] [birth year] 12/11/2013      Progress Note-Follow Up  Subjective  Chief Complaint  Chief Complaint  Patient presents with  . Follow-up    6 week    HPI  Patient is a 58 year old male in today for routine medical care. Hormonal he's tried has helped some. No fevers or chills several loose stool every day. No fevers or chills mild abdominal cramping. Otherwise she reports improvement. No recent injury or falls. No significant headache. Continues to show with low back and hip pain.Denies CP/palp/SOB/HA/congestion/fevers/GI or GU c/o. Taking meds as prescribed  Past Medical History  Diagnosis Date  . Diabetes mellitus   . Hypertension   . History of chicken pox     childhood  . Seasonal allergies   . History of kidney stones   . Hyperlipidemia   . 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 Sciatica of right side 12/22/2012  . Insomnia 02/25/2013  . Abdominal pain, unspecified site 03/11/2013    Left side at site of excision   . Diarrhea 03/11/2013  . Acute neck pain 06/04/2013  . Diverticulosis 10/26/2013  . Colon polyps     adenomatous    Past Surgical History  Procedure Laterality Date  . Hernia repair    . Abdominal hernia repair    . Rotator cuff repair    . Cholecystectomy    . Mass excision  05/22/2012    Procedure: EXCISION MASS;  Surgeon: Joyice Faster. Cornett, MD;  Location: [facility];  Service: General;  Laterality: Left;  excision abdominal wall mass    Family History  Problem Relation Age of Onset  . Lung cancer Mother   . Breast cancer Neg Hx   . Colon cancer Neg Hx   . Prostate cancer Neg Hx   . Heart disease Neg Hx   . Diabetes Maternal Aunt   . Cancer Paternal Grandmother     History   Social History  . Marital Status: Married    Spouse Name: N/A    Number of Children: N/A  . Years of Education: N/A    Occupational History  . Not on file.   Social History Main Topics  . Smoking status: Never Smoker   . Smokeless tobacco: Never Used  . Alcohol Use: No  . Drug Use: No  . Sexual Activity: Not on file   Other Topics Concern  . Not on file   Social History Narrative  . No narrative on file    Current Outpatient Prescriptions on File Prior to Visit  Medication Sig Dispense Refill  . diphenoxylate-atropine (LOMOTIL) 2.5-0.025 MG per tablet Take 1-2 tablets every 4-6 hours as needed for diarrhea  100 tablet  2  . gabapentin (NEURONTIN) 300 MG capsule Take 2 capsules (600 mg total) by mouth 3 (three) times daily.  180 capsule  0  . glucose blood (ONE TOUCH ULTRA TEST) test strip Use as instructed  100 each  2  . lisinopril (PRINIVIL,ZESTRIL) 10 MG tablet Take 1 tablet (10 mg total) by mouth 2 (two) times daily.  180 tablet  1  . sildenafil (VIAGRA) 100 MG tablet Take 50-100 mg by mouth daily as needed for erectile dysfunction.      . sitaGLIPtin (JANUVIA) 100 MG tablet Take 100 mg by mouth daily.      Marland Kitchen  zolpidem (AMBIEN) 10 MG tablet Take 1 tablet (10 mg total) by mouth at bedtime as needed for sleep.  20 tablet  1   No current facility-administered medications on file prior to visit.    Allergies  Allergen Reactions  . Contrast Media [Iodinated Diagnostic Agents] Swelling    Can take if premedicated with diphenhydramine  . Iodine Swelling    Review of Systems  Review of Systems  Constitutional: Negative for fever and malaise/fatigue.  HENT: Negative for congestion.   Eyes: Negative for discharge.  Respiratory: Negative for shortness of breath.   Cardiovascular: Negative for chest pain, palpitations and leg swelling.  Gastrointestinal: Positive for abdominal pain and diarrhea. Negative for nausea, constipation, blood in stool and melena.  Genitourinary: Negative for dysuria.  Musculoskeletal: Negative for falls.  Skin: Negative for rash.  Neurological: Negative for loss  of consciousness and headaches.  Endo/Heme/Allergies: Negative for polydipsia.  Psychiatric/Behavioral: Negative for depression and suicidal ideas. The patient is not nervous/anxious and does not have insomnia.     Objective  BP 140/86  Pulse 69  Temp(Src) 97.8 F (36.6 C) (Oral)  Ht 6' (1.829 m)  Wt 190 lb 1.9 oz (86.238 kg)  BMI 25.78 kg/m2  SpO2 96%  Physical Exam  Physical Exam  Constitutional: He is oriented to person, place, and time and well-developed, well-nourished, and in no distress. No distress.  HENT:  Head: Normocephalic and atraumatic.  Eyes: Conjunctivae are normal.  Neck: Neck supple. No thyromegaly present.  Cardiovascular: Normal rate, regular rhythm and normal heart sounds.   No murmur heard. Pulmonary/Chest: Effort normal and breath sounds normal. No respiratory distress.  Abdominal: He exhibits no distension and no mass. There is no tenderness.  Musculoskeletal: He exhibits no edema.  Neurological: He is alert and oriented to person, place, and time.  Skin: Skin is warm.  Psychiatric: Memory, affect and judgment normal.    Lab Results  Component Value Date   TSH 2.045 12/09/2013   Lab Results  Component Value Date   WBC 6.7 12/09/2013   HGB 13.8 12/09/2013   HCT 39.9 12/09/2013   MCV 86.4 12/09/2013   PLT 216 12/09/2013   Lab Results  Component Value Date   CREATININE 0.74 12/09/2013   BUN 12 12/09/2013   NA 139 12/09/2013   K 4.1 12/09/2013   CL 102 12/09/2013   CO2 26 12/09/2013   Lab Results  Component Value Date   ALT 14 12/09/2013   AST 13 12/09/2013   ALKPHOS 60 12/09/2013   BILITOT 0.5 12/09/2013   Lab Results  Component Value Date   CHOL 102 12/09/2013   Lab Results  Component Value Date   HDL 30* 12/09/2013   Lab Results  Component Value Date   LDLCALC 47 12/09/2013   Lab Results  Component Value Date   TRIG 125 12/09/2013   Lab Results  Component Value Date   CHOLHDL 3.4 12/09/2013     Assessment & Plan  Type II or  unspecified type diabetes mellitus without mention of complication, uncontrolled hgba1c 8.3. Will need to hold Metformin to see if it helps the diarrhea. Will try Glimepiride 1 mg start with once daily and increase to bid as needed. Increase exercise  HTN (hypertension) Well controlled, no changes to meds. Encouraged heart healthy diet such as the DASH diet and exercise as tolerated.   Diarrhea Was seen yesterday by GI. He will hold the Metformin and he was given Lomotil to try, has taken one dose  with some relief. Increase exercise and minimize simple carbs  Other and unspecified hyperlipidemia Tolerating statin, encouraged heart healthy diet, avoid trans fats, minimize simple carbs and saturated fats. Increase exercise as tolerated  BPH (benign prostatic hyperplasia) With kidney stones too. Started on Flomax daily push fluids

## 2013-12-11 NOTE — Progress Notes (Signed)
Pre visit review using our clinic review tool, if applicable. No additional management support is needed unless otherwise documented below in the visit note. 

## 2013-12-11 NOTE — Assessment & Plan Note (Signed)
With kidney stones too. Started on Flomax daily push fluids

## 2013-12-16 ENCOUNTER — Telehealth: Payer: Self-pay

## 2013-12-16 NOTE — Telephone Encounter (Signed)
Relevant patient education assigned to patient using Emmi. ° °

## 2014-01-01 ENCOUNTER — Other Ambulatory Visit: Payer: Self-pay | Admitting: Family Medicine

## 2014-01-15 ENCOUNTER — Encounter: Payer: Self-pay | Admitting: Family Medicine

## 2014-02-27 ENCOUNTER — Telehealth: Payer: Self-pay | Admitting: Family Medicine

## 2014-02-27 DIAGNOSIS — I10 Essential (primary) hypertension: Secondary | ICD-10-CM

## 2014-02-27 MED ORDER — LISINOPRIL 10 MG PO TABS: 10.0000 mg | ORAL_TABLET | Freq: Two times a day (BID) | ORAL | Status: DC

## 2014-02-27 NOTE — Telephone Encounter (Signed)
Refill sent.

## 2014-02-27 NOTE — Telephone Encounter (Signed)
Refill-lisinopril  [facility]

## 2014-03-03 ENCOUNTER — Encounter: Payer: Self-pay | Admitting: Family Medicine

## 2014-03-04 MED ORDER — CIALIS 5 MG PO TABS: 5.0000 mg | ORAL_TABLET | Freq: Every day | ORAL | Status: DC | PRN

## 2014-03-04 NOTE — Telephone Encounter (Signed)
RX sent and [employer]

## 2014-03-16 ENCOUNTER — Ambulatory Visit: Payer: Self-pay | Admitting: Family Medicine

## 2014-03-30 ENCOUNTER — Encounter: Payer: Self-pay | Admitting: Family Medicine

## 2014-03-30 ENCOUNTER — Ambulatory Visit (INDEPENDENT_AMBULATORY_CARE_PROVIDER_SITE_OTHER): Payer: 59 | Admitting: Family Medicine

## 2014-03-30 VITALS — BP 118/72 | HR 89 | Temp 97.9°F | Ht 72.0 in | Wt 188.1 lb

## 2014-03-30 DIAGNOSIS — E1165 Type 2 diabetes mellitus with hyperglycemia: Secondary | ICD-10-CM

## 2014-03-30 DIAGNOSIS — E118 Type 2 diabetes mellitus with unspecified complications: Principal | ICD-10-CM

## 2014-03-30 DIAGNOSIS — E785 Hyperlipidemia, unspecified: Secondary | ICD-10-CM

## 2014-03-30 DIAGNOSIS — N400 Enlarged prostate without lower urinary tract symptoms: Secondary | ICD-10-CM

## 2014-03-30 DIAGNOSIS — I10 Essential (primary) hypertension: Secondary | ICD-10-CM

## 2014-03-30 DIAGNOSIS — IMO0002 Reserved for concepts with insufficient information to code with codable children: Secondary | ICD-10-CM

## 2014-03-30 DIAGNOSIS — IMO0001 Reserved for inherently not codable concepts without codable children: Secondary | ICD-10-CM

## 2014-03-30 LAB — LIPID PANEL
Cholesterol: 138 mg/dL (ref 0–200)
HDL: 32 mg/dL — AB (ref >39)
LDL CALC: 63 mg/dL (ref 0–99)
Total CHOL/HDL Ratio: 4.3 Ratio
Triglycerides: 214 mg/dL — ABNORMAL HIGH (ref <150)
VLDL: 43 mg/dL — AB (ref 0–40)

## 2014-03-30 LAB — CBC
HCT: 43.2 % (ref 39.0–52.0)
Hemoglobin: 15.3 g/dL (ref 13.0–17.0)
MCH: 29.4 pg (ref 26.0–34.0)
MCHC: 35.4 g/dL (ref 30.0–36.0)
MCV: 82.9 fL (ref 78.0–100.0)
Platelets: 220 10*3/uL (ref 150–400)
RBC: 5.21 MIL/uL (ref 4.22–5.81)
RDW: 13.6 % (ref 11.5–15.5)
WBC: 8.7 10*3/uL (ref 4.0–10.5)

## 2014-03-30 LAB — RENAL FUNCTION PANEL
Albumin: 4.1 g/dL (ref 3.5–5.2)
BUN: 20 mg/dL (ref 6–23)
CHLORIDE: 95 meq/L — AB (ref 96–112)
CO2: 24 mEq/L (ref 19–32)
CREATININE: 0.9 mg/dL (ref 0.50–1.35)
Calcium: 9.5 mg/dL (ref 8.4–10.5)
Glucose, Bld: 326 mg/dL — ABNORMAL HIGH (ref 70–99)
PHOSPHORUS: 3.9 mg/dL (ref 2.3–4.6)
POTASSIUM: 4.5 meq/L (ref 3.5–5.3)
SODIUM: 130 meq/L — AB (ref 135–145)

## 2014-03-30 LAB — HEPATIC FUNCTION PANEL
ALBUMIN: 4.1 g/dL (ref 3.5–5.2)
ALK PHOS: 101 U/L (ref 39–117)
ALT: 19 U/L (ref 0–53)
AST: 14 U/L (ref 0–37)
BILIRUBIN TOTAL: 0.4 mg/dL (ref 0.2–1.2)
Bilirubin, Direct: 0.1 mg/dL (ref 0.0–0.3)
Indirect Bilirubin: 0.3 mg/dL (ref 0.2–1.2)
TOTAL PROTEIN: 7.2 g/dL (ref 6.0–8.3)

## 2014-03-30 LAB — TSH: TSH: 2.411 u[IU]/mL (ref 0.350–4.500)

## 2014-03-30 LAB — HEMOGLOBIN A1C
Hgb A1c MFr Bld: 12.7 % — ABNORMAL HIGH (ref <5.7)
Mean Plasma Glucose: 318 mg/dL — ABNORMAL HIGH (ref <117)

## 2014-03-30 MED ORDER — SITAGLIPTIN PHOS-METFORMIN HCL 50-500 MG PO TABS: 1.0000 | ORAL_TABLET | Freq: Two times a day (BID) | ORAL | Status: DC

## 2014-03-30 NOTE — Progress Notes (Signed)
Pre visit review using our clinic review tool, if applicable. No additional management support is needed unless otherwise documented below in the visit note. 

## 2014-03-30 NOTE — Patient Instructions (Signed)

## 2014-03-30 NOTE — Progress Notes (Signed)
[name], male   DOB: [birth year], 58 y.o.   MRN: [id number] ATIF CHAPPLE [id number] 1956/05/28 03/30/2014      Progress Note-Follow Up  Subjective  Chief Complaint  Chief Complaint  Patient presents with  . Follow-up    3 month    HPI  Patient is a 58 year old male in today for routine medical care. He is here today for followup. Reports he has not been taking his Flomax or static meds. Does have some polyuria but is manageable. No recent illness. He has not been following a diabetic diet lately. No recent illness. Denies CP/palp/SOB/HA/congestion/fevers/GI or GU c/o. Taking meds as prescribed  Past Medical History  Diagnosis Date  . Diabetes mellitus   . Hypertension   . History of chicken pox     childhood  . Seasonal allergies   . History of kidney stones   . Hyperlipidemia   . 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 Sciatica of right side 12/22/2012  . Insomnia 02/25/2013  . Abdominal pain, unspecified site 03/11/2013    Left side at site of excision   . Diarrhea 03/11/2013  . Acute neck pain 06/04/2013  . Diverticulosis 10/26/2013  . Colon polyps     adenomatous    Past Surgical History  Procedure Laterality Date  . Hernia repair    . Abdominal hernia repair    . Rotator cuff repair    . Cholecystectomy    . Mass excision  05/22/2012    Procedure: EXCISION MASS;  Surgeon: Joyice Faster. Cornett, MD;  Location: [facility];  Service: General;  Laterality: Left;  excision abdominal wall mass    Family History  Problem Relation Age of Onset  . Lung cancer Mother   . Breast cancer Neg Hx   . Colon cancer Neg Hx   . Prostate cancer Neg Hx   . Heart disease Neg Hx   . Diabetes Maternal Aunt   . Cancer Paternal Grandmother     History   Social History  . Marital Status: Married    Spouse Name: N/A    Number of Children: N/A  . Years of Education: N/A   Occupational History  . Not on file.    Social History Main Topics  . Smoking status: Never Smoker   . Smokeless tobacco: Never Used  . Alcohol Use: No  . Drug Use: No  . Sexual Activity: Not on file   Other Topics Concern  . Not on file   Social History Narrative  . No narrative on file    Current Outpatient Prescriptions on File Prior to Visit  Medication Sig Dispense Refill  . atorvastatin (LIPITOR) 20 MG tablet Take 1 tablet (20 mg total) by mouth daily.  90 tablet  1  . CIALIS 5 MG tablet Take 1 tablet (5 mg total) by mouth daily as needed for erectile dysfunction.  30 tablet  5  . diphenoxylate-atropine (LOMOTIL) 2.5-0.025 MG per tablet Take 1-2 tablets every 4-6 hours as needed for diarrhea  100 tablet  2  . glimepiride (AMARYL) 1 MG tablet Take 1 tablet (1 mg total) by mouth 2 (two) times daily.  60 tablet  3  . glucose blood (ONE TOUCH ULTRA TEST) test strip Use as instructed  100 each  2  . lisinopril (PRINIVIL,ZESTRIL) 10 MG tablet Take 1 tablet (10 mg total) by mouth 2 (two) times daily.  [place]  tablet  0  . sitaGLIPtin (JANUVIA) 100 MG tablet Take 100 mg by mouth daily.      . tamsulosin (FLOMAX) 0.4 MG CAPS capsule Take 1 capsule (0.4 mg total) by mouth daily.  30 capsule  3   No current facility-administered medications on file prior to visit.    Allergies  Allergen Reactions  . Contrast Media [Iodinated Diagnostic Agents] Swelling    Can take if premedicated with diphenhydramine  . Iodine Swelling    Review of Systems  Review of Systems  Constitutional: Negative for fever and malaise/fatigue.  HENT: Negative for congestion.   Eyes: Negative for discharge.  Respiratory: Negative for shortness of breath.   Cardiovascular: Negative for chest pain, palpitations and leg swelling.  Gastrointestinal: Negative for nausea, abdominal pain and diarrhea.  Genitourinary: Negative for dysuria.  Musculoskeletal: Negative for falls.  Skin: Negative for rash.  Neurological: Negative for loss of consciousness  and headaches.  Endo/Heme/Allergies: Negative for polydipsia.  Psychiatric/Behavioral: Negative for depression and suicidal ideas. The patient is not nervous/anxious and does not have insomnia.     Objective  BP 118/72  Pulse 89  Temp(Src) 97.9 F (36.6 C) (Oral)  Ht 6' (1.829 m)  Wt 188 lb 1.9 oz (85.331 kg)  BMI 25.51 kg/m2  SpO2 97%  Physical Exam  Physical Exam  Constitutional: He is oriented to person, place, and time and well-developed, well-nourished, and in no distress. No distress.  HENT:  Head: Normocephalic and atraumatic.  Eyes: Conjunctivae are normal.  Neck: Neck supple. No thyromegaly present.  Cardiovascular: Normal rate, regular rhythm and normal heart sounds.   No murmur heard. Pulmonary/Chest: Effort normal and breath sounds normal. No respiratory distress.  Abdominal: He exhibits no distension and no mass. There is no tenderness.  Musculoskeletal: He exhibits no edema.  Neurological: He is alert and oriented to person, place, and time.  Skin: Skin is warm.  Psychiatric: Memory, affect and judgment normal.    Lab Results  Component Value Date   TSH 2.045 12/09/2013   Lab Results  Component Value Date   WBC 6.7 12/09/2013   HGB 13.8 12/09/2013   HCT 39.9 12/09/2013   MCV 86.4 12/09/2013   PLT 216 12/09/2013   Lab Results  Component Value Date   CREATININE 0.74 12/09/2013   BUN 12 12/09/2013   NA 139 12/09/2013   K 4.1 12/09/2013   CL 102 12/09/2013   CO2 26 12/09/2013   Lab Results  Component Value Date   ALT 14 12/09/2013   AST 13 12/09/2013   ALKPHOS 60 12/09/2013   BILITOT 0.5 12/09/2013   Lab Results  Component Value Date   CHOL 102 12/09/2013   Lab Results  Component Value Date   HDL 30* 12/09/2013   Lab Results  Component Value Date   LDLCALC 47 12/09/2013   Lab Results  Component Value Date   TRIG 125 12/09/2013   Lab Results  Component Value Date   CHOLHDL 3.4 12/09/2013     Assessment & Plan  HTN (hypertension) Well  controlled, no changes to meds. Encouraged heart healthy diet such as the DASH diet and exercise as tolerated.   Type II or unspecified type diabetes mellitus without mention of complication, uncontrolled Poorly controlled, has not been taking meds. Has not been overly careful with his diet. Agrees to try Janumet 50/500 bid. Minimize simple carbs  Other and unspecified hyperlipidemia Tolerating statin, encouraged heart healthy diet, avoid trans fats, minimize simple carbs and saturated fats.  Increase exercise as tolerated  BPH (benign prostatic hyperplasia) Doing well without Flomax. Consider further treatment as needed

## 2014-04-04 NOTE — Assessment & Plan Note (Signed)
Doing well without Flomax. Consider further treatment as needed

## 2014-04-04 NOTE — Assessment & Plan Note (Signed)
Well controlled, no changes to meds. Encouraged heart healthy diet such as the DASH diet and exercise as tolerated.  °

## 2014-04-04 NOTE — Assessment & Plan Note (Addendum)
Tolerating statin, encouraged heart healthy diet, avoid trans fats, minimize simple carbs and saturated fats. Increase exercise as tolerated 

## 2014-04-04 NOTE — Assessment & Plan Note (Signed)
Poorly controlled, has not been taking meds. Has not been overly careful with his diet. Agrees to try Janumet 50/500 bid. Minimize simple carbs

## 2014-05-11 ENCOUNTER — Ambulatory Visit (INDEPENDENT_AMBULATORY_CARE_PROVIDER_SITE_OTHER): Payer: 59 | Admitting: Family Medicine

## 2014-05-11 ENCOUNTER — Encounter: Payer: Self-pay | Admitting: Family Medicine

## 2014-05-11 VITALS — HR 86 | Temp 98.1°F | Ht 72.0 in | Wt 189.5 lb

## 2014-05-11 DIAGNOSIS — E1165 Type 2 diabetes mellitus with hyperglycemia: Secondary | ICD-10-CM

## 2014-05-11 DIAGNOSIS — IMO0001 Reserved for inherently not codable concepts without codable children: Secondary | ICD-10-CM

## 2014-05-11 DIAGNOSIS — R197 Diarrhea, unspecified: Secondary | ICD-10-CM

## 2014-05-11 DIAGNOSIS — J069 Acute upper respiratory infection, unspecified: Secondary | ICD-10-CM

## 2014-05-11 DIAGNOSIS — E871 Hypo-osmolality and hyponatremia: Secondary | ICD-10-CM

## 2014-05-11 DIAGNOSIS — E785 Hyperlipidemia, unspecified: Secondary | ICD-10-CM

## 2014-05-11 DIAGNOSIS — I10 Essential (primary) hypertension: Secondary | ICD-10-CM

## 2014-05-11 LAB — CBC
HCT: 42.1 % (ref 39.0–52.0)
HEMOGLOBIN: 14.4 g/dL (ref 13.0–17.0)
MCHC: 34.3 g/dL (ref 30.0–36.0)
MCV: 87 fl (ref 78.0–100.0)
Platelets: 210 10*3/uL (ref 150.0–400.0)
RBC: 4.84 Mil/uL (ref 4.22–5.81)
RDW: 14.6 % (ref 11.5–15.5)
WBC: 7.1 10*3/uL (ref 4.0–10.5)

## 2014-05-11 LAB — LIPID PANEL
CHOL/HDL RATIO: 5
Cholesterol: 153 mg/dL (ref 0–200)
HDL: 30.5 mg/dL — AB (ref >39.00)
NONHDL: 122.5
Triglycerides: 252 mg/dL — ABNORMAL HIGH (ref 0.0–149.0)
VLDL: 50.4 mg/dL — AB (ref 0.0–40.0)

## 2014-05-11 LAB — RENAL FUNCTION PANEL
Albumin: 4.2 g/dL (ref 3.5–5.2)
BUN: 10 mg/dL (ref 6–23)
CO2: 23 meq/L (ref 19–32)
Calcium: 9.1 mg/dL (ref 8.4–10.5)
Chloride: 103 mEq/L (ref 96–112)
Creatinine, Ser: 0.7 mg/dL (ref 0.4–1.5)
GFR: 120.02 mL/min (ref >60.00)
Glucose, Bld: 198 mg/dL — ABNORMAL HIGH (ref 70–99)
Phosphorus: 2.9 mg/dL (ref 2.3–4.6)
Potassium: 3.9 mEq/L (ref 3.5–5.1)
SODIUM: 133 meq/L — AB (ref 135–145)

## 2014-05-11 LAB — LDL CHOLESTEROL, DIRECT: Direct LDL: 89 mg/dL

## 2014-05-11 LAB — TSH: TSH: 2.09 u[IU]/mL (ref 0.35–4.50)

## 2014-05-11 LAB — HEPATIC FUNCTION PANEL
ALT: 19 U/L (ref 0–53)
AST: 18 U/L (ref 0–37)
Albumin: 4.2 g/dL (ref 3.5–5.2)
Alkaline Phosphatase: 72 U/L (ref 39–117)
BILIRUBIN DIRECT: 0.1 mg/dL (ref 0.0–0.3)
BILIRUBIN TOTAL: 0.5 mg/dL (ref 0.2–1.2)
Total Protein: 7.6 g/dL (ref 6.0–8.3)

## 2014-05-11 LAB — HEMOGLOBIN A1C: HEMOGLOBIN A1C: 10.1 % — AB (ref 4.6–6.5)

## 2014-05-11 MED ORDER — SITAGLIPTIN PHOS-METFORMIN HCL 50-500 MG PO TABS: 1.0000 | ORAL_TABLET | Freq: Two times a day (BID) | ORAL | Status: DC

## 2014-05-11 MED ORDER — AMOXICILLIN 500 MG PO CAPS: 500.0000 mg | ORAL_CAPSULE | Freq: Three times a day (TID) | ORAL | Status: DC

## 2014-05-11 NOTE — Assessment & Plan Note (Signed)
Well controlled, no changes to meds. Encouraged heart healthy diet such as the DASH diet and exercise as tolerated.  °

## 2014-05-11 NOTE — Patient Instructions (Signed)
Encouraged increased rest and hydration, add probiotics such as Digestive Advantage, zinc such as Coldeze or Xicam. Treat fevers as needed. Add the antibiotics if worsens or does not resolve   Basic Carbohydrate Counting for Diabetes Mellitus Carbohydrate counting is a method for keeping track of the amount of carbohydrates you eat. Eating carbohydrates naturally increases the level of sugar (glucose) in your blood, so it is important for you to know the amount that is okay for you to have in every meal. Carbohydrate counting helps keep the level of glucose in your blood within normal limits. The amount of carbohydrates allowed is different for every person. A dietitian can help you calculate the amount that is right for you. Once you know the amount of carbohydrates you can have, you can count the carbohydrates in the foods you want to eat. Carbohydrates are found in the following foods:  Grains, such as breads and cereals.  Dried beans and soy products.  Starchy vegetables, such as potatoes, peas, and corn.  Fruit and fruit juices.  Milk and yogurt.  Sweets and snack foods, such as cake, cookies, candy, chips, soft drinks, and fruit drinks. CARBOHYDRATE COUNTING There are two ways to count the carbohydrates in your food. You can use either of the methods or a combination of both. Reading the "Nutrition Facts" on [place] The "Nutrition Facts" is an area that is included on the labels of almost all packaged food and beverages in the [place]. It includes the serving size of that food or beverage and information about the nutrients in each serving of the food, including the grams (g) of carbohydrate per serving.  Decide the number of servings of this food or beverage that you will be able to eat or drink. Multiply that number of servings by the number of grams of carbohydrate that is listed on the label for that serving. The total will be the amount of carbohydrates you will be having  when you eat or drink this food or beverage. Learning Standard Serving Sizes of Food When you eat food that is not packaged or does not include "Nutrition Facts" on the label, you need to measure the servings in order to count the amount of carbohydrates.A serving of most carbohydrate-rich foods contains about 15 g of carbohydrates. The following list includes serving sizes of carbohydrate-rich foods that provide 15 g ofcarbohydrate per serving:   1 slice of bread (1 oz) or 1 six-inch tortilla.    of a hamburger bun or English muffin.  4-6 crackers.   cup unsweetened dry cereal.    cup hot cereal.   cup rice or pasta.    cup mashed potatoes or  of a large baked potato.  1 cup fresh fruit or one small piece of fruit.    cup canned or frozen fruit or fruit juice.  1 cup milk.   cup plain fat-free yogurt or yogurt sweetened with artificial sweeteners.   cup cooked dried beans or starchy vegetable, such as peas, corn, or potatoes.  Decide the number of standard-size servings that you will eat. Multiply that number of servings by 15 (the grams of carbohydrates in that serving). For example, if you eat 2 cups of strawberries, you will have eaten 2 servings and 30 g of carbohydrates (2 servings x 15 g = 30 g). For foods such as soups and casseroles, in which more than one food is mixed in, you will need to count the carbohydrates in each food that is included.  EXAMPLE OF CARBOHYDRATE COUNTING Sample Dinner  3 oz chicken breast.   cup of brown rice.   cup of corn.  1 cup milk.   1 cup strawberries with sugar-free whipped topping.  Carbohydrate Calculation Step 1: Identify the foods that contain carbohydrates:   Rice.   Corn.   Milk.   Strawberries. Step 2:Calculate the number of servings eaten of each:   2 servings of rice.   1 serving of corn.   1 serving of milk.   1 serving of strawberries. Step 3: Multiply each of those number of  servings by 15 g:   2 servings of rice x 15 g = 30 g.   1 serving of corn x 15 g = 15 g.   1 serving of milk x 15 g = 15 g.   1 serving of strawberries x 15 g = 15 g. Step 4: Add together all of the amounts to find the total grams of carbohydrates eaten: 30 g + 15 g + 15 g + 15 g = 75 g. Document Released: 07/31/2005 Document Revised: 12/15/2013 Document Reviewed: 06/27/2013 [facility] Patient Information 2015 [place], [place]. This information is not intended to replace advice given to you by your health care provider. Make sure you discuss any questions you have with your health care provider.

## 2014-05-11 NOTE — Progress Notes (Signed)
[name], male   DOB: 18-Mar-1956, 58 y.o.   MRN: [id number] YADIER BRAMHALL [id number] 1955-12-11 05/11/2014      Progress Note-Follow Up  Subjective  Chief Complaint  Chief Complaint  Patient presents with  . Follow-up    6 week    HPI  Patient is a 58 year old male in today for routine medical care. He is struggling with respiratory symptoms for several weeks now but is improving. He started with head congestion with movement of the chest but is now felt back in his head. He had malaise myalgias and chills but those have improved. He's had no obvious fevers but he has had some fever blisters. Has had some nausea but no vomiting. Very infrequent diarrhea is also noted. Mucinex was not particularly helpful. He has had some mild anorexia but that is improved. Cultures initially were running in the 2-300 range after last visit but more recently have been in the 150 range. Denies polyuria and polydipsia. Denies CP/palp/SOB/HA/congestion/fevers/GI or GU c/o. Taking meds as prescribed  Past Medical History  Diagnosis Date  . Diabetes mellitus   . Hypertension   . History of chicken pox     childhood  . Seasonal allergies   . History of kidney stones   . Hyperlipidemia   . 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 Sciatica of right side 12/22/2012  . Insomnia 02/25/2013  . Abdominal pain, unspecified site 03/11/2013    Left side at site of excision   . Diarrhea 03/11/2013  . Acute neck pain 06/04/2013  . Diverticulosis 10/26/2013  . Colon polyps     adenomatous  . Acute upper respiratory infections of unspecified site 05/11/2014    Past Surgical History  Procedure Laterality Date  . Hernia repair    . Abdominal hernia repair    . Rotator cuff repair    . Cholecystectomy    . Mass excision  05/22/2012    Procedure: EXCISION MASS;  Surgeon: Joyice Faster. Cornett, MD;  Location: [facility];  Service: General;  Laterality: Left;   excision abdominal wall mass    Family History  Problem Relation Age of Onset  . Lung cancer Mother   . Breast cancer Neg Hx   . Colon cancer Neg Hx   . Prostate cancer Neg Hx   . Heart disease Neg Hx   . Diabetes Maternal Aunt   . Cancer Paternal Grandmother     History   Social History  . Marital Status: Married    Spouse Name: N/A    Number of Children: N/A  . Years of Education: N/A   Occupational History  . Not on file.   Social History Main Topics  . Smoking status: Never Smoker   . Smokeless tobacco: Never Used  . Alcohol Use: No  . Drug Use: No  . Sexual Activity: Not on file   Other Topics Concern  . Not on file   Social History Narrative  . No narrative on file    Current Outpatient Prescriptions on File Prior to Visit  Medication Sig Dispense Refill  . atorvastatin (LIPITOR) 20 MG tablet Take 1 tablet (20 mg total) by mouth daily.  90 tablet  1  . CIALIS 5 MG tablet Take 1 tablet (5 mg total) by mouth daily as needed for erectile dysfunction.  30 tablet  5  . diphenoxylate-atropine (LOMOTIL) 2.5-0.025 MG per tablet Take 1-2  tablets every 4-6 hours as needed for diarrhea  100 tablet  2  . glucose blood (ONE TOUCH ULTRA TEST) test strip Use as instructed  100 each  2  . lisinopril (PRINIVIL,ZESTRIL) 10 MG tablet Take 1 tablet (10 mg total) by mouth 2 (two) times daily.  180 tablet  0   No current facility-administered medications on file prior to visit.    Allergies  Allergen Reactions  . Contrast Media [Iodinated Diagnostic Agents] Swelling    Can take if premedicated with diphenhydramine  . Iodine Swelling    Review of Systems  Review of Systems  Constitutional: Positive for malaise/fatigue. Negative for fever.  HENT: Negative for congestion.   Eyes: Negative for discharge.  Respiratory: Negative for shortness of breath.   Cardiovascular: Negative for chest pain, palpitations and leg swelling.  Gastrointestinal: Negative for nausea,  abdominal pain and diarrhea.  Genitourinary: Negative for dysuria.  Musculoskeletal: Negative for falls.  Skin: Negative for rash.  Neurological: Negative for loss of consciousness and headaches.  Endo/Heme/Allergies: Negative for polydipsia.  Psychiatric/Behavioral: Negative for depression and suicidal ideas. The patient is not nervous/anxious and does not have insomnia.     Objective  Pulse 86  Temp(Src) 98.1 F (36.7 C) (Oral)  Ht 6' (1.829 m)  Wt 189 lb 8 oz (85.957 kg)  BMI 25.70 kg/m2  SpO2 95%  Physical Exam  Physical Exam  Constitutional: He is oriented to person, place, and time and well-developed, well-nourished, and in no distress. No distress.  HENT:  Head: Normocephalic and atraumatic.  Eyes: Conjunctivae are normal.  Neck: Neck supple. No thyromegaly present.  Cardiovascular: Normal rate, regular rhythm and normal heart sounds.   No murmur heard. Pulmonary/Chest: Effort normal and breath sounds normal. No respiratory distress.  Abdominal: He exhibits no distension and no mass. There is no tenderness.  Musculoskeletal: He exhibits no edema.  Neurological: He is alert and oriented to person, place, and time.  Skin: Skin is warm.  Psychiatric: Memory, affect and judgment normal.    Lab Results  Component Value Date   TSH 2.09 05/11/2014   Lab Results  Component Value Date   WBC 7.1 05/11/2014   HGB 14.4 05/11/2014   HCT 42.1 05/11/2014   MCV 87.0 05/11/2014   PLT 210.0 05/11/2014   Lab Results  Component Value Date   CREATININE 0.7 05/11/2014   BUN 10 05/11/2014   NA 133* 05/11/2014   K 3.9 05/11/2014   CL 103 05/11/2014   CO2 23 05/11/2014   Lab Results  Component Value Date   ALT 19 05/11/2014   AST 18 05/11/2014   ALKPHOS 72 05/11/2014   BILITOT 0.5 05/11/2014   Lab Results  Component Value Date   CHOL 153 05/11/2014   Lab Results  Component Value Date   HDL 30.50* 05/11/2014   Lab Results  Component Value Date   LDLCALC 63 03/30/2014   Lab  Results  Component Value Date   TRIG 252.0* 05/11/2014   Lab Results  Component Value Date   CHOLHDL 5 05/11/2014     Assessment & Plan  HTN (hypertension) Well controlled, no changes to meds. Encouraged heart healthy diet such as the DASH diet and exercise as tolerated.   Type II or unspecified type diabetes mellitus without mention of complication, uncontrolled A!1C too hi last month Added Janumet with good improvement. Lowest 113 . Continue same 138 14 day average. Has adjusted diet  Acute upper respiratory infections of unspecified site Encouraged increased rest  and hydration, add probiotics, zinc such as Coldeze or Xicam. Treat fevers as needed. Antibiotic if worsens  Diarrhea improved  Other and unspecified hyperlipidemia Encouraged heart healthy diet, increase exercise, avoid trans fats, consider a krill oil cap daily. Tolerating statin, no changes

## 2014-05-11 NOTE — Progress Notes (Signed)
Pre visit review using our clinic review tool, if applicable. No additional management support is needed unless otherwise documented below in the visit note. 

## 2014-05-11 NOTE — Assessment & Plan Note (Signed)
Encouraged increased rest and hydration, add probiotics, zinc such as Coldeze or Xicam. Treat fevers as needed. Antibiotic if worsens

## 2014-05-11 NOTE — Assessment & Plan Note (Signed)
Encouraged heart healthy diet, increase exercise, avoid trans fats, consider a krill oil cap daily. Tolerating statin, no changes

## 2014-05-11 NOTE — Assessment & Plan Note (Signed)
A!1C too hi last month Added Janumet with good improvement. Lowest 113 . Continue same 138 14 day average. Has adjusted diet

## 2014-05-11 NOTE — Assessment & Plan Note (Signed)
improved

## 2014-05-12 MED ORDER — ATORVASTATIN CALCIUM 40 MG PO TABS: 40.0000 mg | ORAL_TABLET | Freq: Every day | ORAL | Status: DC

## 2014-05-12 NOTE — Addendum Note (Signed)
Addended by: Varney Daily on: 05/12/2014 05:10 PM   Modules accepted: Orders

## 2014-05-13 NOTE — Progress Notes (Signed)
No I sent in 50/500 not 50/1000

## 2014-05-21 ENCOUNTER — Telehealth: Payer: Self-pay | Admitting: Family Medicine

## 2014-05-21 ENCOUNTER — Other Ambulatory Visit: Payer: Self-pay

## 2014-05-21 DIAGNOSIS — E089 Diabetes mellitus due to underlying condition without complications: Secondary | ICD-10-CM

## 2014-05-21 MED ORDER — SITAGLIPTIN PHOS-METFORMIN HCL 50-500 MG PO TABS: 1.0000 | ORAL_TABLET | Freq: Two times a day (BID) | ORAL | Status: DC

## 2014-05-21 NOTE — Telephone Encounter (Signed)
Was sent this morning

## 2014-05-21 NOTE — Telephone Encounter (Signed)
Caller name: Meredity  Call back number: [phone] Pharmacy: [facility]  Reason for call: pt is downstairs, and they can see the order for Janumet in [place], but they don't actually have the script down there.  Pt is down there right now, and Ailene Ravel wants to know if you can resend.

## 2014-06-09 ENCOUNTER — Other Ambulatory Visit: Payer: Self-pay | Admitting: Physician Assistant

## 2014-06-17 ENCOUNTER — Encounter: Payer: Self-pay | Admitting: Family Medicine

## 2014-07-16 ENCOUNTER — Ambulatory Visit: Payer: Self-pay | Admitting: Family Medicine

## 2014-07-17 ENCOUNTER — Ambulatory Visit (INDEPENDENT_AMBULATORY_CARE_PROVIDER_SITE_OTHER): Payer: 59 | Admitting: Family Medicine

## 2014-07-17 VITALS — BP 124/78 | HR 72

## 2014-07-17 DIAGNOSIS — E1149 Type 2 diabetes mellitus with other diabetic neurological complication: Secondary | ICD-10-CM

## 2014-07-17 DIAGNOSIS — G609 Hereditary and idiopathic neuropathy, unspecified: Secondary | ICD-10-CM

## 2014-07-17 DIAGNOSIS — J069 Acute upper respiratory infection, unspecified: Secondary | ICD-10-CM

## 2014-07-17 DIAGNOSIS — Z0000 Encounter for general adult medical examination without abnormal findings: Secondary | ICD-10-CM

## 2014-07-17 DIAGNOSIS — E785 Hyperlipidemia, unspecified: Secondary | ICD-10-CM

## 2014-07-17 DIAGNOSIS — I10 Essential (primary) hypertension: Secondary | ICD-10-CM

## 2014-07-17 DIAGNOSIS — E1140 Type 2 diabetes mellitus with diabetic neuropathy, unspecified: Secondary | ICD-10-CM

## 2014-07-19 LAB — RENAL FUNCTION PANEL
Albumin: 4.2 g/dL (ref 3.5–5.2)
BUN: 18 mg/dL (ref 6–23)
CO2: 17 meq/L — AB (ref 19–32)
Calcium: 9.4 mg/dL (ref 8.4–10.5)
Chloride: 106 mEq/L (ref 96–112)
Creatinine, Ser: 0.9 mg/dL (ref 0.4–1.5)
GFR: 96.41 mL/min (ref >60.00)
Glucose, Bld: 327 mg/dL — ABNORMAL HIGH (ref 70–99)
PHOSPHORUS: 4.1 mg/dL (ref 2.3–4.6)
POTASSIUM: 4.6 meq/L (ref 3.5–5.1)
SODIUM: 138 meq/L (ref 135–145)

## 2014-07-19 LAB — LIPID PANEL
CHOL/HDL RATIO: 5
Cholesterol: 188 mg/dL (ref 0–200)
HDL: 35.8 mg/dL — ABNORMAL LOW (ref >39.00)
NONHDL: 152.2
Triglycerides: 266 mg/dL — ABNORMAL HIGH (ref 0.0–149.0)
VLDL: 53.2 mg/dL — ABNORMAL HIGH (ref 0.0–40.0)

## 2014-07-19 LAB — TSH: TSH: 1.31 u[IU]/mL (ref 0.35–4.50)

## 2014-07-19 LAB — HEPATIC FUNCTION PANEL
ALK PHOS: 71 U/L (ref 39–117)
ALT: 21 U/L (ref 0–53)
AST: 19 U/L (ref 0–37)
Albumin: 4.2 g/dL (ref 3.5–5.2)
BILIRUBIN TOTAL: 0.5 mg/dL (ref 0.2–1.2)
Bilirubin, Direct: 0.1 mg/dL (ref 0.0–0.3)
TOTAL PROTEIN: 7.8 g/dL (ref 6.0–8.3)

## 2014-07-19 LAB — LDL CHOLESTEROL, DIRECT: Direct LDL: 116.4 mg/dL

## 2014-07-19 LAB — HEMOGLOBIN A1C: Hgb A1c MFr Bld: 10.2 % — ABNORMAL HIGH (ref 4.6–6.5)

## 2014-07-23 ENCOUNTER — Other Ambulatory Visit: Payer: Self-pay | Admitting: Family Medicine

## 2014-07-23 ENCOUNTER — Telehealth: Payer: Self-pay | Admitting: Family Medicine

## 2014-07-23 DIAGNOSIS — M25512 Pain in left shoulder: Secondary | ICD-10-CM

## 2014-07-23 NOTE — Telephone Encounter (Signed)
Pt is requesting referral to Dr Gardenia Phlegm

## 2014-07-26 ENCOUNTER — Encounter: Payer: Self-pay | Admitting: Family Medicine

## 2014-07-26 DIAGNOSIS — G609 Hereditary and idiopathic neuropathy, unspecified: Secondary | ICD-10-CM

## 2014-07-26 DIAGNOSIS — Z0000 Encounter for general adult medical examination without abnormal findings: Secondary | ICD-10-CM

## 2014-07-26 DIAGNOSIS — E785 Hyperlipidemia, unspecified: Secondary | ICD-10-CM

## 2014-07-26 DIAGNOSIS — E782 Mixed hyperlipidemia: Secondary | ICD-10-CM | POA: Insufficient documentation

## 2014-07-26 HISTORY — DX: Hyperlipidemia, unspecified: E78.5

## 2014-07-26 HISTORY — DX: Hereditary and idiopathic neuropathy, unspecified: G60.9

## 2014-07-26 HISTORY — DX: Encounter for general adult medical examination without abnormal findings: Z00.00

## 2014-07-26 NOTE — Assessment & Plan Note (Signed)
With peripheral neuropathy, discussed need to control sugars to prevent progression. Will increase insulin as directed and avoid simple carbs, eat small frequent meals with lean proteins.

## 2014-07-26 NOTE — Assessment & Plan Note (Signed)
Well controlled, no changes to meds. Encouraged heart healthy diet such as the DASH diet and exercise as tolerated.  °

## 2014-07-26 NOTE — Assessment & Plan Note (Signed)
Tolerating statin, encouraged heart healthy diet, avoid trans fats, minimize simple carbs and saturated fats. Increase exercise as tolerated 

## 2014-07-26 NOTE — Assessment & Plan Note (Signed)
resolved 

## 2014-07-26 NOTE — Assessment & Plan Note (Signed)
Patient encouraged to maintain heart healthy diet, regular exercise, adequate sleep. Consider daily probiotics. Take medications as prescribed. Discussed need for advanced directives. Discussed need for improved diet.

## 2014-07-26 NOTE — Assessment & Plan Note (Signed)
Declines meds, the discomfort is tolerable. Will report if worsens, encouraged to control sugar at length.

## 2014-07-26 NOTE — Progress Notes (Signed)
Albert [name]  [id number] 06-01-1956 07/26/2014      Progress Note-Follow Up  Subjective  Chief Complaint  No chief complaint on file.   HPI  Patient is a 58 y.o. male in today for routine medical care. He is in today for annual exam. He is doing fairly well. He does continue to struggle with peripheral neuropathy and tingling painful sensation in his feet. It is tolerable but persistent. No recent sugars colitis is resolved. Denies CP/palp/SOB/HA/congestion/fevers/GI or GU c/o. Taking meds as prescribed  Past Medical History  Diagnosis Date  . Diabetes mellitus   . Hypertension   . History of chicken pox     childhood  . Seasonal allergies   . History of kidney stones   . Hyperlipidemia   . 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 Sciatica of right side 12/22/2012  . Insomnia 02/25/2013  . Abdominal pain, unspecified site 03/11/2013    Left side at site of excision   . Diarrhea 03/11/2013  . Acute neck pain 06/04/2013  . Diverticulosis 10/26/2013  . Colon polyps     adenomatous  . Acute upper respiratory infections of unspecified site 05/11/2014  . DM (diabetes mellitus), type 2 with neurological complications 5/95/6387  . Hyperlipidemia 07/26/2014  . 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 Preventative health care 07/26/2014    Past Surgical History  Procedure Laterality Date  . Hernia repair    . Abdominal hernia repair    . Rotator cuff repair    . Cholecystectomy    . Mass excision  05/22/2012    Procedure: EXCISION MASS;  Surgeon: Joyice Faster. Cornett, MD;  Location: [facility];  Service: General;  Laterality: Left;  excision abdominal wall mass    Family History  Problem Relation Age of Onset  . Lung cancer Mother   . Breast cancer Neg Hx   . Colon cancer Neg Hx   . Prostate cancer Neg Hx   . Heart disease Neg Hx   . Diabetes Maternal Aunt   . Cancer Paternal Grandmother     History     Social History  . Marital Status: Married    Spouse Name: N/A    Number of Children: N/A  . Years of Education: N/A   Occupational History  . Not on file.   Social History Main Topics  . Smoking status: Never Smoker   . Smokeless tobacco: Never Used  . Alcohol Use: No  . Drug Use: No  . Sexual Activity: Not on file   Other Topics Concern  . Not on file   Social History Narrative    Current Outpatient Prescriptions on File Prior to Visit  Medication Sig Dispense Refill  . atorvastatin (LIPITOR) 40 MG tablet Take 1 tablet (40 mg total) by mouth daily. 90 tablet 1  . CIALIS 5 MG tablet Take 1 tablet (5 mg total) by mouth daily as needed for erectile dysfunction. 30 tablet 5  . diphenoxylate-atropine (LOMOTIL) 2.5-0.025 MG per tablet Take 1-2 tablets every 4-6 hours as needed for diarrhea 100 tablet 2  . glucose blood (ONE TOUCH ULTRA TEST) test strip Use as instructed 100 each 2  . lisinopril (PRINIVIL,ZESTRIL) 10 MG tablet TAKE 1 TABLET BY MOUTH 2 TIMES DAILY. 180 tablet 0  . sitaGLIPtin-metformin (JANUMET) 50-500 MG per tablet Take 1 tablet by mouth 2 (two) times daily with a meal. 180 tablet 1   No current  facility-administered medications on file prior to visit.    Allergies  Allergen Reactions  . Contrast Media [Iodinated Diagnostic Agents] Swelling    Can take if premedicated with diphenhydramine  . Iodine Swelling    Review of Systems  Review of Systems  Constitutional: Positive for malaise/fatigue. Negative for fever.  HENT: Negative for congestion.   Eyes: Negative for discharge.  Respiratory: Negative for shortness of breath.   Cardiovascular: Negative for chest pain, palpitations and leg swelling.  Gastrointestinal: Negative for nausea, abdominal pain and diarrhea.  Genitourinary: Negative for dysuria.  Musculoskeletal: Negative for falls.  Skin: Negative for rash.  Neurological: Positive for tingling. Negative for loss of consciousness and headaches.   Endo/Heme/Allergies: Negative for polydipsia.  Psychiatric/Behavioral: Negative for depression and suicidal ideas. The patient is not nervous/anxious and does not have insomnia.     Objective  BP 124/78 mmHg  Pulse 72  Physical Exam  Physical Exam  Constitutional: He is oriented to person, place, and time and well-developed, well-nourished, and in no distress. No distress.  HENT:  Head: Normocephalic and atraumatic.  Eyes: Conjunctivae are normal.  Neck: Neck supple. No thyromegaly present.  Cardiovascular: Normal rate, regular rhythm and normal heart sounds.   No murmur heard. Pulmonary/Chest: Effort normal and breath sounds normal. No respiratory distress.  Abdominal: He exhibits no distension and no mass. There is no tenderness.  Musculoskeletal: He exhibits no edema.  Neurological: He is alert and oriented to person, place, and time.  Skin: Skin is warm.  Psychiatric: Memory, affect and judgment normal.    Lab Results  Component Value Date   TSH 1.31 07/17/2014   Lab Results  Component Value Date   WBC 7.1 05/11/2014   HGB 14.4 05/11/2014   HCT 42.1 05/11/2014   MCV 87.0 05/11/2014   PLT 210.0 05/11/2014   Lab Results  Component Value Date   CREATININE 0.9 07/17/2014   BUN 18 07/17/2014   NA 138 07/17/2014   K 4.6 07/17/2014   CL 106 07/17/2014   CO2 17* 07/17/2014   Lab Results  Component Value Date   ALT 21 07/17/2014   AST 19 07/17/2014   ALKPHOS 71 07/17/2014   BILITOT 0.5 07/17/2014   Lab Results  Component Value Date   CHOL 188 07/17/2014   Lab Results  Component Value Date   HDL 35.80* 07/17/2014   Lab Results  Component Value Date   LDLCALC 63 03/30/2014   Lab Results  Component Value Date   TRIG 266.0* 07/17/2014   Lab Results  Component Value Date   CHOLHDL 5 07/17/2014     Assessment & Plan  HTN (hypertension) Well controlled, no changes to meds. Encouraged heart healthy diet such as the DASH diet and exercise as  tolerated.   Acute upper respiratory infection resolved  DM (diabetes mellitus), type 2 with neurological complications With peripheral neuropathy, discussed need to control sugars to prevent progression. Will increase insulin as directed and avoid simple carbs, eat small frequent meals with lean proteins.   Hyperlipidemia Tolerating statin, encouraged heart healthy diet, avoid trans fats, minimize simple carbs and saturated fats. Increase exercise as tolerated  Hereditary and idiopathic peripheral neuropathy Declines meds, the discomfort is tolerable. Will report if worsens, encouraged to control sugar at length.  Preventative health care Patient encouraged to maintain heart healthy diet, regular exercise, adequate sleep. Consider daily probiotics. Take medications as prescribed. Discussed need for advanced directives. Discussed need for improved diet.

## 2014-08-12 ENCOUNTER — Ambulatory Visit (INDEPENDENT_AMBULATORY_CARE_PROVIDER_SITE_OTHER): Payer: 59 | Admitting: Family Medicine

## 2014-08-12 ENCOUNTER — Encounter: Payer: Self-pay | Admitting: Family Medicine

## 2014-08-12 VITALS — BP 140/82 | HR 83 | Ht 72.0 in | Wt 198.0 lb

## 2014-08-12 DIAGNOSIS — M7552 Bursitis of left shoulder: Secondary | ICD-10-CM | POA: Insufficient documentation

## 2014-08-12 NOTE — Patient Instructions (Signed)
Good to see you.  Ice 20 minutes 2 times daily. Usually after activity and before bed. Exercises 3 times a week.  Turmeric 500mg  twice daily Fish oil 2 grams daily.  See me again in 3 weeks and we will see how we are doing.

## 2014-08-12 NOTE — Progress Notes (Signed)
[facility] [address] [place], [place] [address] Phone: [phone] Subjective:    I'm seeing this patient by the request  of:  [name], MD   CC: Left shoulder pain  [id number] Albert [name] is a 58 y.o. male coming in with complaint of left shoulder pain. Patient states that in July and patient did help his daughter move in hand pain in his left shoulder immediately. Patient does have a past medical history significant for rotator cuff tear on the right side but states that this felt different. Patient describes the pain is more of a dull aching pain that is intermittent and can be pain free from time. States that certain movements seem to be agitating and denies any weakness. Patient is tried some over-the-counter medicines with minimal improvement. Patient puts the severity of 6 out of 10. This is not stopping him from any activities of daily living. Patient states though if he rolls on that side at night it can wake him up.     Past medical history, social, surgical and family history all reviewed in electronic medical record.   Review of Systems: No headache, visual changes, nausea, vomiting, diarrhea, constipation, dizziness, abdominal pain, skin rash, fevers, chills, night sweats, weight loss, swollen lymph nodes, body aches, joint swelling, muscle aches, chest pain, shortness of breath, mood changes.   Objective Blood pressure 140/82, pulse 83, height 6' (1.829 m), weight 198 lb (89.812 kg), SpO2 96 %.  General: No apparent distress alert and oriented x3 mood and affect normal, dressed appropriately.  HEENT: Pupils equal, extraocular movements intact  Respiratory: Patient's speak in full sentences and does not appear short of breath  Cardiovascular: No lower extremity edema, non tender, no erythema  Skin: Warm dry intact with no signs of infection or rash on extremities or on axial skeleton.  Abdomen: Soft nontender  Neuro: Cranial nerves  II through XII are intact, neurovascularly intact in all extremities with 2+ DTRs and 2+ pulses.  Lymph: No lymphadenopathy of posterior or anterior cervical chain or axillae bilaterally.  Gait normal with good balance and coordination.  MSK:  Non tender with full range of motion and good stability and symmetric strength and tone of elbows, wrist, hip, knee and ankles bilaterally.  Shoulder: left Inspection reveals no abnormalities, atrophy or asymmetry. Palpation is normal with no tenderness over AC joint or bicipital groove. ROM is full in all planes passively. Mild rtc weakness compared to contralateral side. signs of impingement with positive Neer and Hawkin's tests, but negative empty can sign. Speeds and Yergason's tests normal. No labral pathology noted with negative Obrien's, negative clunk and good stability. Normal scapular function observed. No painful arc and no drop arm sign. No apprehension sign  MSK US performed of: left This study was ordered, performed, and interpreted by Charlann Boxer D.O.  Shoulder:   Supraspinatus:  Appears normal on long and transverse views, Bursal bulge seen with shoulder abduction on impingement view. Infraspinatus:  Appears normal on long and transverse views. Significant increase in Doppler flow Subscapularis:  Appears normal on long and transverse views. Positive bursa Teres Minor:  Appears normal on long and transverse views. AC joint:  Capsule undistended, no geyser sign. Glenohumeral Joint:  Appears normal without effusion. Glenoid Labrum:  Intact without visualized tears. Biceps Tendon:  Appears normal on long and transverse views, no fraying of tendon, tendon located in intertubercular groove, no subluxation with shoulder internal or external rotation.  Impression: Subacromial bursitis  After informed written and verbal consent, patient was seated on exam table. Left shoulder was prepped with alcohol swab and utilizing posterior approach,  patient's right glenohumeral space was injected with 4:1  marcaine 0.5%: Kenalog 40mg /dL. Patient tolerated the procedure well without immediate complications.    Impression and Recommendations:     This case required medical decision making of moderate complexity.

## 2014-08-12 NOTE — Assessment & Plan Note (Signed)
Patient was given an injection today. Discussed icing regimen, home exercises, as well as topical over-the-counter medications. Patient did feel significantly better after the injection so I think he will do well. Differential also includes partial rotator cuff tear but no CVA weakness. Discussed with patient that this may affect his blood sugars for a short course. Patient will come back in 3 weeks to make sure he is improving.

## 2014-08-15 ENCOUNTER — Encounter (HOSPITAL_COMMUNITY): Payer: Self-pay | Admitting: Emergency Medicine

## 2014-08-15 ENCOUNTER — Observation Stay (HOSPITAL_COMMUNITY)
Admission: EM | Admit: 2014-08-15 | Discharge: 2014-08-17 | Disposition: A | Discharge status: Home / Self Care | Payer: PRIVATE HEALTH INSURANCE | Attending: Surgery | Admitting: Surgery

## 2014-08-15 DIAGNOSIS — R52 Pain, unspecified: Secondary | ICD-10-CM

## 2014-08-15 DIAGNOSIS — E785 Hyperlipidemia, unspecified: Secondary | ICD-10-CM | POA: Insufficient documentation

## 2014-08-15 DIAGNOSIS — G609 Hereditary and idiopathic neuropathy, unspecified: Secondary | ICD-10-CM | POA: Diagnosis not present

## 2014-08-15 DIAGNOSIS — Z85828 Personal history of other malignant neoplasm of skin: Secondary | ICD-10-CM | POA: Insufficient documentation

## 2014-08-15 DIAGNOSIS — E119 Type 2 diabetes mellitus without complications: Secondary | ICD-10-CM | POA: Diagnosis not present

## 2014-08-15 DIAGNOSIS — K3580 Unspecified acute appendicitis: Secondary | ICD-10-CM | POA: Diagnosis not present

## 2014-08-15 DIAGNOSIS — Z87442 Personal history of urinary calculi: Secondary | ICD-10-CM | POA: Diagnosis not present

## 2014-08-15 DIAGNOSIS — Y9289 Other specified places as the place of occurrence of the external cause: Secondary | ICD-10-CM | POA: Diagnosis not present

## 2014-08-15 DIAGNOSIS — I10 Essential (primary) hypertension: Secondary | ICD-10-CM | POA: Diagnosis not present

## 2014-08-15 DIAGNOSIS — R0781 Pleurodynia: Secondary | ICD-10-CM | POA: Diagnosis present

## 2014-08-15 DIAGNOSIS — T1490XA Injury, unspecified, initial encounter: Secondary | ICD-10-CM

## 2014-08-15 DIAGNOSIS — R1011 Right upper quadrant pain: Secondary | ICD-10-CM
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
## 2014-08-16 ENCOUNTER — Emergency Department (HOSPITAL_COMMUNITY): Payer: PRIVATE HEALTH INSURANCE

## 2014-08-16 ENCOUNTER — Encounter (HOSPITAL_COMMUNITY): Payer: Self-pay | Admitting: Radiology

## 2014-08-16 ENCOUNTER — Encounter (HOSPITAL_COMMUNITY)
Admission: EM | Disposition: A | Discharge status: Home / Self Care | Payer: Self-pay | Source: Home / Self Care | Attending: Emergency Medicine

## 2014-08-16 ENCOUNTER — Observation Stay (HOSPITAL_COMMUNITY): Payer: PRIVATE HEALTH INSURANCE | Admitting: Anesthesiology

## 2014-08-16 DIAGNOSIS — K3580 Unspecified acute appendicitis: Secondary | ICD-10-CM | POA: Diagnosis present

## 2014-08-16 HISTORY — PX: LAPAROSCOPIC APPENDECTOMY: SHX408

## 2014-08-16 LAB — CBC WITH DIFFERENTIAL/PLATELET
BASOS ABS: 0 10*3/uL (ref 0.0–0.1)
Basophils Relative: 0 % (ref 0–1)
EOS ABS: 0 10*3/uL (ref 0.0–0.7)
Eosinophils Relative: 0 % (ref 0–5)
HEMATOCRIT: 45.2 % (ref 39.0–52.0)
Hemoglobin: 16.1 g/dL (ref 13.0–17.0)
Lymphocytes Relative: 14 % (ref 12–46)
Lymphs Abs: 2 10*3/uL (ref 0.7–4.0)
MCH: 29.8 pg (ref 26.0–34.0)
MCHC: 35.6 g/dL (ref 30.0–36.0)
MCV: 83.7 fL (ref 78.0–100.0)
MONO ABS: 0.6 10*3/uL (ref 0.1–1.0)
Monocytes Relative: 4 % (ref 3–12)
Neutro Abs: 11.1 10*3/uL — ABNORMAL HIGH (ref 1.7–7.7)
Neutrophils Relative %: 82 % — ABNORMAL HIGH (ref 43–77)
Platelets: 196 10*3/uL (ref 150–400)
RBC: 5.4 MIL/uL (ref 4.22–5.81)
RDW: 12.9 % (ref 11.5–15.5)
WBC: 13.7 10*3/uL — AB (ref 4.0–10.5)

## 2014-08-16 LAB — I-STAT CHEM 8, ED
BUN: 22 mg/dL (ref 6–23)
Calcium, Ion: 1.22 mmol/L (ref 1.12–1.23)
Chloride: 102 mEq/L (ref 96–112)
Creatinine, Ser: 0.8 mg/dL (ref 0.50–1.35)
GLUCOSE: 358 mg/dL — AB (ref 70–99)
HCT: 52 % (ref 39.0–52.0)
Hemoglobin: 17.7 g/dL — ABNORMAL HIGH (ref 13.0–17.0)
POTASSIUM: 3.8 mmol/L (ref 3.5–5.1)
Sodium: 136 mmol/L (ref 135–145)
TCO2: 19 mmol/L (ref 0–100)

## 2014-08-16 LAB — GLUCOSE, CAPILLARY
GLUCOSE-CAPILLARY: 328 mg/dL — AB (ref 70–99)
GLUCOSE-CAPILLARY: 291 mg/dL — AB (ref 70–99)
GLUCOSE-CAPILLARY: 297 mg/dL — AB (ref 70–99)
Glucose-Capillary: 344 mg/dL — ABNORMAL HIGH (ref 70–99)
Glucose-Capillary: 283 mg/dL — ABNORMAL HIGH (ref 70–99)

## 2014-08-16 LAB — SURGICAL PCR SCREEN
MRSA, PCR: NEGATIVE
Staphylococcus aureus: POSITIVE — AB

## 2014-08-16 LAB — PROTIME-INR
INR: 1.1 (ref 0.00–1.49)
Prothrombin Time: 14.3 seconds (ref 11.6–15.2)

## 2014-08-16 SURGERY — APPENDECTOMY, LAPAROSCOPIC
Anesthesia: General | Site: Abdomen

## 2014-08-16 MED ORDER — MORPHINE SULFATE 2 MG/ML IJ SOLN
INTRAMUSCULAR | Status: AC
  Filled 2014-08-16: qty 1

## 2014-08-16 MED ORDER — HYDROCORTISONE NA SUCCINATE PF 100 MG IJ SOLR
200.0000 mg | Freq: Once | INTRAMUSCULAR | Status: AC
  Administered 2014-08-16: 200 mg via INTRAVENOUS
  Filled 2014-08-16: qty 4

## 2014-08-16 MED ORDER — EPHEDRINE SULFATE 50 MG/ML IJ SOLN
INTRAMUSCULAR | Status: DC | PRN
  Administered 2014-08-16 (×2): 5 mg via INTRAVENOUS

## 2014-08-16 MED ORDER — ONDANSETRON HCL 4 MG/2ML IJ SOLN
4.0000 mg | Freq: Once | INTRAMUSCULAR | Status: AC
  Administered 2014-08-16: 4 mg via INTRAVENOUS
  Filled 2014-08-16: qty 2

## 2014-08-16 MED ORDER — ONDANSETRON HCL 4 MG/2ML IJ SOLN
4.0000 mg | Freq: Four times a day (QID) | INTRAMUSCULAR | Status: DC | PRN
Start: 2014-08-16 — End: 2014-08-17
  Administered 2014-08-16: 4 mg via INTRAVENOUS
  Filled 2014-08-16: qty 2

## 2014-08-16 MED ORDER — INSULIN ASPART 100 UNIT/ML ~~LOC~~ SOLN
SUBCUTANEOUS | Status: AC
  Filled 2014-08-16: qty 8

## 2014-08-16 MED ORDER — KCL IN DEXTROSE-NACL 20-5-0.9 MEQ/L-%-% IV SOLN
INTRAVENOUS | Status: DC
  Administered 2014-08-16 (×2): via INTRAVENOUS
  Filled 2014-08-16 (×5): qty 1000

## 2014-08-16 MED ORDER — DIPHENHYDRAMINE HCL 50 MG/ML IJ SOLN
50.0000 mg | Freq: Once | INTRAMUSCULAR | Status: AC
  Administered 2014-08-16: 50 mg via INTRAVENOUS
  Filled 2014-08-16: qty 1

## 2014-08-16 MED ORDER — MORPHINE SULFATE 2 MG/ML IJ SOLN
2.0000 mg | INTRAMUSCULAR | Status: DC | PRN
  Administered 2014-08-16 (×2): 4 mg via INTRAVENOUS
  Administered 2014-08-16: 2 mg via INTRAVENOUS
  Administered 2014-08-16: 4 mg via INTRAVENOUS
  Filled 2014-08-16 (×3): qty 2

## 2014-08-16 MED ORDER — MIDAZOLAM HCL 5 MG/5ML IJ SOLN
INTRAMUSCULAR | Status: DC | PRN
  Administered 2014-08-16: 2 mg via INTRAVENOUS

## 2014-08-16 MED ORDER — FENTANYL CITRATE 0.05 MG/ML IJ SOLN
INTRAMUSCULAR | Status: AC
  Filled 2014-08-16: qty 5

## 2014-08-16 MED ORDER — OXYCODONE HCL 5 MG/5ML PO SOLN: 5.0000 mg | Freq: Once | ORAL | Status: DC | PRN

## 2014-08-16 MED ORDER — PROPOFOL 10 MG/ML IV BOLUS
INTRAVENOUS | Status: AC
  Filled 2014-08-16: qty 20

## 2014-08-16 MED ORDER — IOHEXOL 300 MG/ML  SOLN
100.0000 mL | Freq: Once | INTRAMUSCULAR | Status: AC | PRN
  Administered 2014-08-16: 100 mL via INTRAVENOUS

## 2014-08-16 MED ORDER — BUPIVACAINE-EPINEPHRINE 0.5% -1:200000 IJ SOLN
INTRAMUSCULAR | Status: DC | PRN
  Administered 2014-08-16: 13 mL

## 2014-08-16 MED ORDER — OXYCODONE-ACETAMINOPHEN 5-325 MG PO TABS
1.0000 | ORAL_TABLET | ORAL | Status: DC | PRN
Start: 2014-08-16 — End: 2014-08-17
  Administered 2014-08-17 (×2): 1 via ORAL
  Filled 2014-08-16 (×2): qty 1

## 2014-08-16 MED ORDER — HYDROMORPHONE HCL 1 MG/ML IJ SOLN
INTRAMUSCULAR | Status: AC
  Filled 2014-08-16: qty 1

## 2014-08-16 MED ORDER — GABAPENTIN 300 MG PO CAPS
300.0000 mg | ORAL_CAPSULE | Freq: Every day | ORAL | Status: DC
  Administered 2014-08-16: 300 mg via ORAL
  Filled 2014-08-16 (×2): qty 1

## 2014-08-16 MED ORDER — PROPOFOL 10 MG/ML IV BOLUS
INTRAVENOUS | Status: DC | PRN
  Administered 2014-08-16: 170 mg via INTRAVENOUS

## 2014-08-16 MED ORDER — HYDROMORPHONE HCL 1 MG/ML IJ SOLN
0.2500 mg | INTRAMUSCULAR | Status: DC | PRN
  Administered 2014-08-16 (×4): 0.5 mg via INTRAVENOUS

## 2014-08-16 MED ORDER — IOHEXOL 300 MG/ML  SOLN: 100.0000 mL | Freq: Once | INTRAMUSCULAR | Status: AC | PRN

## 2014-08-16 MED ORDER — HYDROMORPHONE HCL 1 MG/ML IJ SOLN
1.0000 mg | Freq: Once | INTRAMUSCULAR | Status: AC
  Administered 2014-08-16: 1 mg via INTRAVENOUS
  Filled 2014-08-16: qty 1

## 2014-08-16 MED ORDER — GLYCOPYRROLATE 0.2 MG/ML IJ SOLN
INTRAMUSCULAR | Status: DC | PRN
  Administered 2014-08-16: 0.6 mg via INTRAVENOUS

## 2014-08-16 MED ORDER — BUPIVACAINE-EPINEPHRINE (PF) 0.5% -1:200000 IJ SOLN
INTRAMUSCULAR | Status: AC
  Filled 2014-08-16: qty 30

## 2014-08-16 MED ORDER — DIPHENHYDRAMINE HCL 50 MG/ML IJ SOLN: 12.5000 mg | Freq: Four times a day (QID) | INTRAMUSCULAR | Status: DC | PRN

## 2014-08-16 MED ORDER — OXYCODONE HCL 5 MG PO TABS: 5.0000 mg | ORAL_TABLET | Freq: Once | ORAL | Status: DC | PRN

## 2014-08-16 MED ORDER — MIDAZOLAM HCL 2 MG/2ML IJ SOLN
INTRAMUSCULAR | Status: AC
  Filled 2014-08-16: qty 2

## 2014-08-16 MED ORDER — MORPHINE SULFATE 4 MG/ML IJ SOLN
4.0000 mg | Freq: Once | INTRAMUSCULAR | Status: AC
Start: 2014-08-16 — End: 2014-08-16
  Administered 2014-08-16: 4 mg via INTRAVENOUS
  Filled 2014-08-16: qty 1

## 2014-08-16 MED ORDER — PHENYLEPHRINE HCL 10 MG/ML IJ SOLN
INTRAMUSCULAR | Status: DC | PRN
Start: 2014-08-16 — End: 2014-08-16
  Administered 2014-08-16 (×3): 40 ug via INTRAVENOUS

## 2014-08-16 MED ORDER — LIDOCAINE HCL 4 % MT SOLN
OROMUCOSAL | Status: DC | PRN
  Administered 2014-08-16: 4 mL via TOPICAL

## 2014-08-16 MED ORDER — LIDOCAINE HCL (CARDIAC) 20 MG/ML IV SOLN
INTRAVENOUS | Status: DC | PRN
  Administered 2014-08-16: 20 mg via INTRAVENOUS

## 2014-08-16 MED ORDER — ENOXAPARIN SODIUM 40 MG/0.4ML ~~LOC~~ SOLN
40.0000 mg | SUBCUTANEOUS | Status: DC
  Administered 2014-08-17: 40 mg via SUBCUTANEOUS
  Filled 2014-08-16 (×2): qty 0.4

## 2014-08-16 MED ORDER — 0.9 % SODIUM CHLORIDE (POUR BTL) OPTIME
TOPICAL | Status: DC | PRN
  Administered 2014-08-16: 1000 mL

## 2014-08-16 MED ORDER — ONDANSETRON HCL 4 MG/2ML IJ SOLN: 4.0000 mg | Freq: Once | INTRAMUSCULAR | Status: DC | PRN

## 2014-08-16 MED ORDER — PANTOPRAZOLE SODIUM 40 MG IV SOLR
40.0000 mg | Freq: Every day | INTRAVENOUS | Status: DC
Start: 2014-08-16 — End: 2014-08-17
  Administered 2014-08-16: 40 mg via INTRAVENOUS
  Filled 2014-08-16 (×2): qty 40

## 2014-08-16 MED ORDER — FENTANYL CITRATE 0.05 MG/ML IJ SOLN
INTRAMUSCULAR | Status: DC | PRN
  Administered 2014-08-16: 100 ug via INTRAVENOUS
  Administered 2014-08-16 (×3): 50 ug via INTRAVENOUS

## 2014-08-16 MED ORDER — METRONIDAZOLE IN NACL 5-0.79 MG/ML-% IV SOLN
500.0000 mg | Freq: Three times a day (TID) | INTRAVENOUS | Status: DC
  Administered 2014-08-16 – 2014-08-17 (×3): 500 mg via INTRAVENOUS
  Filled 2014-08-16 (×6): qty 100

## 2014-08-16 MED ORDER — ROCURONIUM BROMIDE 100 MG/10ML IV SOLN
INTRAVENOUS | Status: DC | PRN
  Administered 2014-08-16: 40 mg via INTRAVENOUS

## 2014-08-16 MED ORDER — DIPHENHYDRAMINE HCL 12.5 MG/5ML PO ELIX: 12.5000 mg | ORAL_SOLUTION | Freq: Four times a day (QID) | ORAL | Status: DC | PRN

## 2014-08-16 MED ORDER — ONDANSETRON HCL 4 MG/2ML IJ SOLN
INTRAMUSCULAR | Status: DC | PRN
  Administered 2014-08-16: 4 mg via INTRAVENOUS

## 2014-08-16 MED ORDER — ARTIFICIAL TEARS OP OINT
TOPICAL_OINTMENT | OPHTHALMIC | Status: DC | PRN
  Administered 2014-08-16: 1 via OPHTHALMIC

## 2014-08-16 MED ORDER — INSULIN ASPART 100 UNIT/ML ~~LOC~~ SOLN
0.0000 [IU] | Freq: Three times a day (TID) | SUBCUTANEOUS | Status: DC
  Administered 2014-08-17: 11 [IU] via SUBCUTANEOUS

## 2014-08-16 MED ORDER — SUCCINYLCHOLINE CHLORIDE 20 MG/ML IJ SOLN
INTRAMUSCULAR | Status: DC | PRN
Start: 2014-08-16 — End: 2014-08-16
  Administered 2014-08-16: 120 mg via INTRAVENOUS

## 2014-08-16 MED ORDER — SODIUM CHLORIDE 0.9 % IV SOLN
Freq: Once | INTRAVENOUS | Status: AC
  Administered 2014-08-16: 04:00:00 via INTRAVENOUS

## 2014-08-16 MED ORDER — ZOLPIDEM TARTRATE 5 MG PO TABS: 5.0000 mg | ORAL_TABLET | Freq: Every evening | ORAL | Status: DC | PRN

## 2014-08-16 MED ORDER — INSULIN ASPART 100 UNIT/ML ~~LOC~~ SOLN
0.0000 [IU] | SUBCUTANEOUS | Status: DC
  Administered 2014-08-16: 11 [IU] via SUBCUTANEOUS
  Administered 2014-08-16 (×2): 8 [IU] via SUBCUTANEOUS

## 2014-08-16 MED ORDER — LISINOPRIL 10 MG PO TABS
10.0000 mg | ORAL_TABLET | Freq: Two times a day (BID) | ORAL | Status: DC
  Administered 2014-08-16 – 2014-08-17 (×3): 10 mg via ORAL
  Filled 2014-08-16 (×5): qty 1

## 2014-08-16 MED ORDER — CEFTRIAXONE SODIUM IN DEXTROSE 40 MG/ML IV SOLN
2.0000 g | INTRAVENOUS | Status: DC
  Administered 2014-08-16 – 2014-08-17 (×2): 2 g via INTRAVENOUS
  Filled 2014-08-16 (×2): qty 50

## 2014-08-16 MED ORDER — NEOSTIGMINE METHYLSULFATE 10 MG/10ML IV SOLN
INTRAVENOUS | Status: DC | PRN
  Administered 2014-08-16: 4 mg via INTRAVENOUS

## 2014-08-16 MED ORDER — SODIUM CHLORIDE 0.9 % IR SOLN
Status: DC | PRN
  Administered 2014-08-16: 1

## 2014-08-16 MED ORDER — LACTATED RINGERS IV SOLN
INTRAVENOUS | Status: DC | PRN
  Administered 2014-08-16 (×2): via INTRAVENOUS

## 2014-08-16 MED ORDER — MIDAZOLAM HCL 2 MG/2ML IJ SOLN
2.0000 mg | Freq: Once | INTRAMUSCULAR | Status: AC
  Administered 2014-08-16: 0.5 mg via INTRAVENOUS

## 2014-08-16 SURGICAL SUPPLY — 40 items
APPLIER CLIP ROT 10 11.4 M/L (STAPLE)
BLADE SURG ROTATE 9660 (MISCELLANEOUS) IMPLANT
CANISTER SUCTION 2500CC (MISCELLANEOUS) ×2 IMPLANT
CHLORAPREP W/TINT 26ML (MISCELLANEOUS) ×2 IMPLANT
CLIP APPLIE ROT 10 11.4 M/L (STAPLE) IMPLANT
COVER SURGICAL LIGHT HANDLE (MISCELLANEOUS) ×2 IMPLANT
CUTTER FLEX LINEAR 45M (STAPLE) ×2 IMPLANT
DRAPE LAPAROSCOPIC ABDOMINAL (DRAPES) ×2 IMPLANT
DRAPE UTILITY XL STRL (DRAPES) ×4 IMPLANT
DRAPE WARM FLUID 44X44 (DRAPE) ×2 IMPLANT
ELECT REM PT RETURN 9FT ADLT (ELECTROSURGICAL) ×2
ELECTRODE REM PT RTRN 9FT ADLT (ELECTROSURGICAL) ×1 IMPLANT
ENDOLOOP SUT PDS II  0 18 (SUTURE)
ENDOLOOP SUT PDS II 0 18 (SUTURE) IMPLANT
GLOVE BIO SURGEON STRL SZ8 (GLOVE) ×2 IMPLANT
GLOVE BIOGEL PI IND STRL 8 (GLOVE) ×1 IMPLANT
GLOVE BIOGEL PI INDICATOR 8 (GLOVE) ×1
GOWN STRL REUS W/ TWL LRG LVL3 (GOWN DISPOSABLE) ×2 IMPLANT
GOWN STRL REUS W/ TWL XL LVL3 (GOWN DISPOSABLE) ×1 IMPLANT
GOWN STRL REUS W/TWL LRG LVL3 (GOWN DISPOSABLE) ×2
GOWN STRL REUS W/TWL XL LVL3 (GOWN DISPOSABLE) ×1
KIT BASIN OR (CUSTOM PROCEDURE TRAY) ×2 IMPLANT
KIT ROOM TURNOVER OR (KITS) ×2 IMPLANT
LIQUID BAND (GAUZE/BANDAGES/DRESSINGS) ×2 IMPLANT
NS IRRIG 1000ML POUR BTL (IV SOLUTION) ×2 IMPLANT
PAD ARMBOARD 7.5X6 YLW CONV (MISCELLANEOUS) ×4 IMPLANT
POUCH SPECIMEN RETRIEVAL 10MM (ENDOMECHANICALS) ×2 IMPLANT
RELOAD STAPLE TA45 3.5 REG BLU (ENDOMECHANICALS) ×2 IMPLANT
SCALPEL HARMONIC ACE (MISCELLANEOUS) ×2 IMPLANT
SET IRRIG TUBING LAPAROSCOPIC (IRRIGATION / IRRIGATOR) ×2 IMPLANT
SPECIMEN JAR SMALL (MISCELLANEOUS) ×2 IMPLANT
SUT MON AB 4-0 PC3 18 (SUTURE) ×2 IMPLANT
SUT VICRYL 0 UR6 27IN ABS (SUTURE) ×4 IMPLANT
TOWEL OR 17X24 6PK STRL BLUE (TOWEL DISPOSABLE) ×2 IMPLANT
TOWEL OR 17X26 10 PK STRL BLUE (TOWEL DISPOSABLE) ×2 IMPLANT
TRAY FOLEY CATH 16FR SILVER (SET/KITS/TRAYS/PACK) ×2 IMPLANT
TRAY LAPAROSCOPIC (CUSTOM PROCEDURE TRAY) ×2 IMPLANT
TROCAR XCEL BLADELESS 5X75MML (TROCAR) ×6 IMPLANT
TROCAR XCEL BLUNT TIP 100MML (ENDOMECHANICALS) ×2 IMPLANT
TUBING INSUFFLATION (TUBING) ×2 IMPLANT

## 2014-08-16 NOTE — ED Notes (Addendum)
Pt returned from CT °

## 2014-08-16 NOTE — ED Notes (Signed)
Pt went to restroom. Pt to CT.

## 2014-08-16 NOTE — Anesthesia Postprocedure Evaluation (Signed)
  Anesthesia Post-op Note  Patient: Albert [name]  Procedure(s) Performed: Procedure(s): APPENDECTOMY LAPAROSCOPIC (N/A)  Patient Location: PACU  Anesthesia Type:General  Level of Consciousness: awake, alert  and oriented  Airway and Oxygen Therapy: Patient Spontanous Breathing and Patient connected to nasal cannula oxygen  Post-op Pain: mild  Post-op Assessment: Post-op Vital signs reviewed, Patient's Cardiovascular Status Stable, Respiratory Function Stable, Patent Airway and Pain level controlled  Post-op Vital Signs: stable  Last Vitals:  Filed Vitals:   08/16/14 1418  BP: 131/68  Pulse: 92  Temp: 36.6 C  Resp: 15    Complications: No apparent anesthesia complications

## 2014-08-16 NOTE — ED Notes (Signed)
CT notified pre meds given

## 2014-08-16 NOTE — ED Provider Notes (Signed)
Date: 08/16/2014  Rate: 94  Rhythm: normal sinus rhythm  QRS Axis: normal  Intervals: normal  ST/T Wave abnormalities: normal  Conduction Disutrbances:noneRBBB  Narrative Interpretation:   Old EKG Reviewed: unchanged    Malvin Johns, MD 08/16/14 [phone]

## 2014-08-16 NOTE — ED Notes (Signed)
No bruising/abnormalities noted to right ribcage/chest upon inspection.

## 2014-08-16 NOTE — Anesthesia Preprocedure Evaluation (Addendum)
Anesthesia Evaluation  Patient identified by MRN, date of birth, ID band Patient awake    Reviewed: Allergy & Precautions, H&P , NPO status , Patient's Chart, lab work & pertinent test results  Airway Mallampati: I  TM Distance: >3 FB Neck ROM: Full    Dental  (+) Chipped, Teeth Intact, Dental Advisory Given   Pulmonary  breath sounds clear to auscultation        Cardiovascular hypertension, Pt. on medications Rhythm:Regular Rate:Normal     Neuro/Psych  Headaches,    GI/Hepatic Patient received Oral Contrast Agents,  Endo/Other  diabetes, Type 2, Oral Hypoglycemic Agents  Renal/GU      Musculoskeletal   Abdominal   Peds  Hematology   Anesthesia Other Findings Early acute appendicitis  Reproductive/Obstetrics                            Anesthesia Physical Anesthesia Plan  ASA: III and emergent  Anesthesia Plan: General   Post-op Pain Management:    Induction: Intravenous  Airway Management Planned: Oral ETT  Additional Equipment:   Intra-op Plan:   Post-operative Plan: Extubation in OR  Informed Consent: I have reviewed the patients History and Physical, chart, labs and discussed the procedure including the risks, benefits and alternatives for the proposed anesthesia with the patient or authorized representative who has indicated his/her understanding and acceptance.   Dental advisory given  Plan Discussed with: CRNA and Anesthesiologist  Anesthesia Plan Comments: (Acute appendicitis Type 2 DM last glucose 328 Hypertension  Plan GA with oral ETT  Roberts Gaudy)        Anesthesia Quick Evaluation

## 2014-08-16 NOTE — Progress Notes (Signed)
UR completed 
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
## 2014-08-16 NOTE — Transfer of Care (Signed)
Immediate Anesthesia Transfer of Care Note  Patient: Albert [name]  Procedure(s) Performed: Procedure(s): APPENDECTOMY LAPAROSCOPIC (N/A)  Patient Location: PACU  Anesthesia Type:General  Level of Consciousness: awake and alert   Airway & Oxygen Therapy: Patient Spontanous Breathing and Patient connected to nasal cannula oxygen  Post-op Assessment: Report given to PACU RN, Post -op Vital signs reviewed and stable and Patient moving all extremities  Post vital signs: Reviewed and stable  Complications: No apparent anesthesia complications

## 2014-08-16 NOTE — Brief Op Note (Signed)
08/15/2014 - 08/16/2014  12:28 PM  PATIENT:  Albert [name]  59 y.o. male  PRE-OPERATIVE DIAGNOSIS:  acute appendicitis  POST-OPERATIVE DIAGNOSIS:  acute appendicitis  PROCEDURE:  Procedure(s): APPENDECTOMY LAPAROSCOPIC (N/A)  SURGEON:  Surgeon(s) and Role:    * Erroll Luna, MD - Primary      ANESTHESIA:   local and general  EBL:  Total I/O In: 1000 [I.V.:1000] Out: -   BLOOD ADMINISTERED:none  DRAINS: none   LOCAL MEDICATIONS USED:  BUPIVICAINE   SPECIMEN:  Source of Specimen:  appendix  DISPOSITION OF SPECIMEN:  PATHOLOGY  COUNTS:  YES  TOURNIQUET:  * No tourniquets in log *  DICTATION: .Other Dictation: Dictation Number [id number]  PLAN OF CARE: Admit to inpatient   PATIENT DISPOSITION:  PACU - hemodynamically stable.   Delay start of Pharmacological VTE agent (>24hrs) due to surgical blood loss or risk of bleeding: no

## 2014-08-16 NOTE — Interval H&P Note (Signed)
History and Physical Interval Note:  08/16/2014 10:52 AM  Albert [name]  has presented today for surgery, with the diagnosis of acute appendicitis  The various methods of treatment have been discussed with the patient and family. After consideration of risks, benefits and other options for treatment, the patient has consented to  Procedure(s): APPENDECTOMY LAPAROSCOPIC (N/A) as a surgical intervention .  The patient's history has been reviewed, patient examined, no change in status, stable for surgery.  I have reviewed the patient's chart and labs.  Questions were answered to the patient's satisfaction.     Catheryn Slifer A.

## 2014-08-16 NOTE — Anesthesia Procedure Notes (Signed)
Procedure Name: Intubation Date/Time: 08/16/2014 11:05 AM Performed by: Suzy Bouchard Pre-anesthesia Checklist: Patient identified, Emergency Drugs available, Suction available, Patient being monitored and Timeout performed Patient Re-evaluated:Patient Re-evaluated prior to inductionOxygen Delivery Method: Circle system utilized Preoxygenation: Pre-oxygenation with 100% oxygen Intubation Type: Cricoid Pressure applied, Rapid sequence and IV induction Laryngoscope Size: Miller and 2 Grade View: Grade I Tube type: Oral Number of attempts: 1 Airway Equipment and Method: LTA kit utilized Placement Confirmation: breath sounds checked- equal and bilateral,  positive ETCO2 and ETT inserted through vocal cords under direct vision Secured at: 22 cm Tube secured with: Tape Dental Injury: Teeth and Oropharynx as per pre-operative assessment
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
## 2014-08-17 ENCOUNTER — Encounter (HOSPITAL_COMMUNITY): Payer: Self-pay | Admitting: Surgery

## 2014-08-17 ENCOUNTER — Encounter: Payer: Self-pay | Admitting: Family Medicine

## 2014-08-17 ENCOUNTER — Other Ambulatory Visit: Payer: Self-pay | Admitting: Family Medicine

## 2014-08-17 DIAGNOSIS — R0781 Pleurodynia: Secondary | ICD-10-CM | POA: Diagnosis present

## 2014-08-17 LAB — GLUCOSE, CAPILLARY: Glucose-Capillary: 324 mg/dL — ABNORMAL HIGH (ref 70–99)

## 2014-08-17 MED ORDER — OXYCODONE-ACETAMINOPHEN 5-325 MG PO TABS
1.0000 | ORAL_TABLET | Freq: Four times a day (QID) | ORAL | Status: DC | PRN
Start: 2014-08-17 — End: 2014-09-01

## 2014-08-17 NOTE — Progress Notes (Signed)
Patient being d/c to home via wheelchair with wife. D/c instructions and rx's given and explained with patient demonstrating understanding. VSS pain denied
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
## 2014-08-17 NOTE — Telephone Encounter (Signed)
Last filled:  05/12/14 Amt:  90, 1 Too soon to fill.

## 2014-08-17 NOTE — Discharge Instructions (Signed)
Your appointment is at 1:30pm, please arrive at least 30 min before your appointment to complete your check in paperwork.  If you are unable to arrive 30 min prior to your appointment time we may have to cancel or reschedule you.  LAPAROSCOPIC SURGERY: POST OP INSTRUCTIONS  1. DIET: Follow a light bland diet the first 24 hours after arrival home, such as soup, liquids, crackers, etc. Be sure to include lots of fluids daily. Avoid fast food or heavy meals as your are more likely to get nauseated. Eat a low fat the next few days after surgery.  2. Take your usually prescribed home medications unless otherwise directed. 3. PAIN CONTROL:  1. Pain is best controlled by a usual combination of three different methods TOGETHER:  1. Ice/Heat 2. Over the counter pain medication 3. Prescription pain medication 2. Most patients will experience some swelling and bruising around the incisions. Ice packs or heating pads (30-60 minutes up to 6 times a day) will help. Use ice for the first few days to help decrease swelling and bruising, then switch to heat to help relax tight/sore spots and speed recovery. Some people prefer to use ice alone, heat alone, alternating between ice & heat. Experiment to what works for you. Swelling and bruising can take several weeks to resolve.  3. It is helpful to take an over-the-counter pain medication regularly for the first few weeks. Choose one of the following that works best for you:  1. Naproxen (Aleve, etc) Two 220mg  tabs twice a day 2. Ibuprofen (Advil, etc) Three 200mg  tabs four times a day (every meal & bedtime) 3. Acetaminophen (Tylenol, etc) 500-650mg  four times a day (every meal & bedtime) 4. A prescription for pain medication (such as oxycodone, hydrocodone, etc) should be given to you upon discharge. Take your pain medication as prescribed.  1. If you are having problems/concerns with the prescription medicine (does not control pain, nausea, vomiting, rash, itching,  etc), please call us [phone] to see if we need to switch you to a different pain medicine that will work better for you and/or control your side effect better. 2. If you need a refill on your pain medication, please contact your pharmacy. They will contact our office to request authorization. Prescriptions will not be filled after 5 pm or on week-ends. 4. Avoid getting constipated. Between the surgery and the pain medications, it is common to experience some constipation. Increasing fluid intake and taking a fiber supplement (such as Metamucil, Citrucel, FiberCon, MiraLax, etc) 1-2 times a day regularly will usually help prevent this problem from occurring. A mild laxative (prune juice, Milk of Magnesia, MiraLax, etc) should be taken according to package directions if there are no bowel movements after 48 hours.  5. Watch out for diarrhea. If you have many loose bowel movements, simplify your diet to bland foods & liquids for a few days. Stop any stool softeners and decrease your fiber supplement. Switching to mild anti-diarrheal medications (Kayopectate, Pepto Bismol) can help. If this worsens or does not improve, please call us. 6. Wash / shower every day. You may shower over the dressings as they are waterproof. Continue to shower over incision(s) after the dressing is off. 7. Remove your waterproof bandages 5 days after surgery. You may leave the incision open to air. You may replace a dressing/Band-Aid to cover the incision for comfort if you wish.  8. ACTIVITIES as tolerated:  1. You may resume regular (light) daily activities beginning the next day--such as  walking, climbing stairs--gradually increasing activities as tolerated. If you can walk 30 minutes without difficulty, it is safe to try more intense activity such as jogging, treadmill, bicycling, low-impact aerobics, swimming, etc. °2. Save the most intensive and strenuous activity for last such as sit-ups, heavy lifting,  contact sports, etc Refrain from any heavy lifting or straining until you are off narcotics for pain control.  °3. DO NOT PUSH THROUGH PAIN. Let pain be your guide: If it hurts to do something, don't do it. Pain is your body warning you to avoid that activity for another week until the pain goes down. °4. You may drive when you are no longer taking prescription pain medication, you can comfortably wear a seatbelt, and you can safely maneuver your car and apply brakes. °5. You may have sexual intercourse when it is comfortable.  °9. FOLLOW UP in our office  °1. Please call [facility] at [phone] to set up an appointment to see your surgeon in the office for a follow-up appointment approximately 2-3 weeks after your surgery. °2. Make sure that you call for this appointment the day you arrive home to insure a convenient appointment time. °     10. IF YOU HAVE DISABILITY OR FAMILY LEAVE FORMS, BRING THEM TO THE               OFFICE FOR PROCESSING.  ° °WHEN TO CALL US [phone]:  °1. Poor pain control °2. Reactions / problems with new medications (rash/itching, nausea, etc)  °3. Fever over 101.5 F (38.5 C) °4. Inability to urinate °5. Nausea and/or vomiting °6. Worsening swelling or bruising °7. Continued bleeding from incision. °8. Increased pain, redness, or drainage from the incision ° °The clinic staff is available to answer your questions during regular business hours (8:30am-5pm). Please don’t hesitate to call and ask to speak to one of our nurses for clinical concerns.  °If you have a medical emergency, go to the nearest emergency room or call 911.  °A surgeon from [facility] is always on call at the hospitals  ° °[facility]  °[address], [address], [place], [place] [address] ?  °MAIN: [phone] ? TOLL FREE: [phone] ?  °FAX [phone]  °[email or web address] ° °

## 2014-08-20 ENCOUNTER — Telehealth: Payer: Self-pay | Admitting: Family Medicine

## 2014-08-20 ENCOUNTER — Encounter (HOSPITAL_BASED_OUTPATIENT_CLINIC_OR_DEPARTMENT_OTHER): Payer: Self-pay | Admitting: Family Medicine

## 2014-08-20 ENCOUNTER — Emergency Department (HOSPITAL_COMMUNITY): Payer: PRIVATE HEALTH INSURANCE

## 2014-08-20 ENCOUNTER — Emergency Department (HOSPITAL_BASED_OUTPATIENT_CLINIC_OR_DEPARTMENT_OTHER)
Admission: EM | Admit: 2014-08-20 | Discharge: 2014-08-20 | Disposition: A | Discharge status: Home / Self Care | Payer: PRIVATE HEALTH INSURANCE | Attending: Emergency Medicine | Admitting: Emergency Medicine

## 2014-08-20 ENCOUNTER — Emergency Department (HOSPITAL_BASED_OUTPATIENT_CLINIC_OR_DEPARTMENT_OTHER): Payer: PRIVATE HEALTH INSURANCE

## 2014-08-20 DIAGNOSIS — R51 Headache: Secondary | ICD-10-CM | POA: Diagnosis not present

## 2014-08-20 DIAGNOSIS — R42 Dizziness and giddiness: Secondary | ICD-10-CM | POA: Diagnosis not present

## 2014-08-20 DIAGNOSIS — E119 Type 2 diabetes mellitus without complications: Secondary | ICD-10-CM | POA: Insufficient documentation

## 2014-08-20 DIAGNOSIS — I10 Essential (primary) hypertension: Secondary | ICD-10-CM | POA: Diagnosis not present

## 2014-08-20 DIAGNOSIS — Z9049 Acquired absence of other specified parts of digestive tract: Secondary | ICD-10-CM

## 2014-08-20 DIAGNOSIS — R519 Headache, unspecified: Secondary | ICD-10-CM

## 2014-08-20 LAB — COMPREHENSIVE METABOLIC PANEL
ALT: 59 U/L — AB (ref 0–53)
ANION GAP: 13 (ref 5–15)
AST: 48 U/L — ABNORMAL HIGH (ref 0–37)
Albumin: 4.2 g/dL (ref 3.5–5.2)
Alkaline Phosphatase: 88 U/L (ref 39–117)
BUN: 20 mg/dL (ref 6–23)
CO2: 20 mmol/L (ref 19–32)
Calcium: 9.5 mg/dL (ref 8.4–10.5)
Chloride: 99 mEq/L (ref 96–112)
Creatinine, Ser: 0.75 mg/dL (ref 0.50–1.35)
GFR calc non Af Amer: >90 mL/min (ref >90)
Glucose, Bld: 382 mg/dL — ABNORMAL HIGH (ref 70–99)
Potassium: 4 mmol/L (ref 3.5–5.1)
SODIUM: 132 mmol/L — AB (ref 135–145)
TOTAL PROTEIN: 8.5 g/dL — AB (ref 6.0–8.3)
Total Bilirubin: 1 mg/dL (ref 0.3–1.2)

## 2014-08-20 LAB — CBC WITH DIFFERENTIAL/PLATELET
Basophils Absolute: 0 10*3/uL (ref 0.0–0.1)
Basophils Relative: 0 % (ref 0–1)
EOS PCT: 3 % (ref 0–5)
Eosinophils Absolute: 0.2 10*3/uL (ref 0.0–0.7)
HEMATOCRIT: 46.9 % (ref 39.0–52.0)
HEMOGLOBIN: 16.6 g/dL (ref 13.0–17.0)
LYMPHS ABS: 1.8 10*3/uL (ref 0.7–4.0)
LYMPHS PCT: 20 % (ref 12–46)
MCH: 29.3 pg (ref 26.0–34.0)
MCHC: 35.4 g/dL (ref 30.0–36.0)
MCV: 82.7 fL (ref 78.0–100.0)
Monocytes Absolute: 1 10*3/uL (ref 0.1–1.0)
Monocytes Relative: 11 % (ref 3–12)
NEUTROS ABS: 5.9 10*3/uL (ref 1.7–7.7)
Neutrophils Relative %: 66 % (ref 43–77)
PLATELETS: 228 10*3/uL (ref 150–400)
RBC: 5.67 MIL/uL (ref 4.22–5.81)
RDW: 13 % (ref 11.5–15.5)
WBC: 9 10*3/uL (ref 4.0–10.5)

## 2014-08-20 LAB — URINALYSIS, ROUTINE W REFLEX MICROSCOPIC
BILIRUBIN URINE: NEGATIVE
Glucose, UA: >1000 mg/dL — AB
Hgb urine dipstick: NEGATIVE
KETONES UR: NEGATIVE mg/dL
Leukocytes, UA: NEGATIVE
NITRITE: NEGATIVE
Protein, ur: NEGATIVE mg/dL
Specific Gravity, Urine: 1.042 — ABNORMAL HIGH (ref 1.005–1.030)
UROBILINOGEN UA: 1 mg/dL (ref 0.0–1.0)
pH: 5.5 (ref 5.0–8.0)

## 2014-08-20 LAB — URINE MICROSCOPIC-ADD ON

## 2014-08-20 LAB — TROPONIN I

## 2014-08-20 MED ORDER — MECLIZINE HCL 50 MG PO TABS: 50.0000 mg | ORAL_TABLET | Freq: Three times a day (TID) | ORAL | Status: DC | PRN

## 2014-08-20 MED ORDER — METOCLOPRAMIDE HCL 5 MG/ML IJ SOLN
10.0000 mg | Freq: Once | INTRAMUSCULAR | Status: AC
  Administered 2014-08-20: 10 mg via INTRAVENOUS
  Filled 2014-08-20: qty 2

## 2014-08-20 MED ORDER — SODIUM CHLORIDE 0.9 % IV BOLUS (SEPSIS)
1000.0000 mL | Freq: Once | INTRAVENOUS | Status: AC
  Administered 2014-08-20: 1000 mL via INTRAVENOUS

## 2014-08-20 MED ORDER — BUTALBITAL-APAP-CAFFEINE 50-325-40 MG PO TABS: 1.0000 | ORAL_TABLET | Freq: Four times a day (QID) | ORAL | Status: DC | PRN

## 2014-08-20 MED ORDER — HYDROCODONE-ACETAMINOPHEN 5-325 MG PO TABS
1.0000 | ORAL_TABLET | Freq: Once | ORAL | Status: AC
  Administered 2014-08-20: 1 via ORAL
  Filled 2014-08-20: qty 1

## 2014-08-20 MED ORDER — MORPHINE SULFATE 4 MG/ML IJ SOLN
4.0000 mg | Freq: Once | INTRAMUSCULAR | Status: AC
  Administered 2014-08-20: 4 mg via INTRAVENOUS
  Filled 2014-08-20: qty 1

## 2014-08-20 MED ORDER — SODIUM CHLORIDE 0.9 % IV SOLN
INTRAVENOUS | Status: DC
  Administered 2014-08-20: 15:00:00 via INTRAVENOUS

## 2014-08-20 MED ORDER — DIPHENHYDRAMINE HCL 50 MG/ML IJ SOLN
25.0000 mg | Freq: Once | INTRAMUSCULAR | Status: AC
  Administered 2014-08-20: 25 mg via INTRAVENOUS
  Filled 2014-08-20: qty 1

## 2014-08-20 NOTE — Telephone Encounter (Signed)
Transferred patient due to dizziness.

## 2014-08-20 NOTE — Telephone Encounter (Signed)
Patient Name: Albert [name]  DOB: Apr 16, 1956    Nurse Assessment  Nurse: Mallie Mussel, RN, Alveta Heimlich Date/Time Eilene Ghazi Time): 08/20/2014 12:02:01 PM  Confirm and document reason for call. If symptomatic, describe symptoms. ---Caller states that he has a headache and dizziness which began around 5AM today. He rates his pain as 9-10 on 0-10 scale. This is the worst headache of his life. He has taken Tyhlenol but it didn't help. He was operated on Sunday morning after having his appendix removed. He was discharged on Monday. He is able to stand and walk, but has to be careful. He has the room spinning sensation. He has to grab onto things in order to walk.  Has the patient traveled out of the country within the last 30 days? ---No  Does the patient require triage? ---Yes  Related visit to physician within the last 2 weeks? ---No  Does the PT have any chronic conditions? (i.e. diabetes, asthma, etc.) ---Yes  List chronic conditions. ---Type II Diabetes, HTN, Hypercholesterolemia     Guidelines    Guideline Title Affirmed Question Affirmed Notes  Dizziness - Vertigo SEVERE dizziness (vertigo) (e.g., unable to walk without assistance)    Final Disposition User   Go to ED Now (or PCP triage) Mallie Mussel, RN, Alveta Heimlich    Comments  I called the backline and spoke with Colletta Maryland. Information given. States she is not a Marine scientist, she will have to get a nurse on the line. I was connected to [facility]. Information given. They do not have an appointment within the specified time frame on profile for this outcome. Caller was notified. Verbalized understanding.
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
## 2014-08-20 NOTE — ED Provider Notes (Signed)
CSN: [id number]     Arrival date & time 08/20/14  1259 History   First MD Initiated Contact with Patient 08/20/14 1314     Chief Complaint  Patient presents with  . Dizziness     (Consider location/radiation/quality/duration/timing/severity/associated sxs/prior Treatment) HPI The patient reports that he woke this morning at 5 AM with a severe frontal headache and dizziness. The pain is not particularly positional with leaning forward. He denies any significant sinus drainage or nasal bleeding. This is not a symptom that he's had in the past. He reports that the pain is intense pressure in his forehead. He denies vomiting in association with this. Denies visual changes. He has not had gait incoordination. He is status post an appendectomy several days ago and felt that he had been recovering from that without significant pain or change in his baseline function. Not had fever or inability to tolerate food orally. Past Medical History  Diagnosis Date  . Diabetes mellitus   . Hypertension   . History of chicken pox     childhood  . Seasonal allergies   . History of kidney stones   . Hyperlipidemia   . 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 Sciatica of right side 12/22/2012  . Insomnia 02/25/2013  . Abdominal pain, unspecified site 03/11/2013    Left side at site of excision   . Diarrhea 03/11/2013  . Acute neck pain 06/04/2013  . Diverticulosis 10/26/2013  . Colon polyps     adenomatous  . Acute upper respiratory infections of unspecified site 05/11/2014  . DM (diabetes mellitus), type 2 with neurological complications 04/14/5175  . Hyperlipidemia 07/26/2014  . 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 Preventative health care 07/26/2014   Past Surgical History  Procedure Laterality Date  . Hernia repair    . Abdominal hernia repair    . Rotator cuff repair    . Cholecystectomy    . Mass excision  05/22/2012    Procedure: EXCISION  MASS;  Surgeon: Joyice Faster. Cornett, MD;  Location: [place];  Service: General;  Laterality: Left;  excision abdominal wall mass  . Laparoscopic appendectomy N/A 08/16/2014    Procedure: APPENDECTOMY LAPAROSCOPIC;  Surgeon: Erroll Luna, MD;  Location: [facility] OR;  Service: General;  Laterality: N/A;   Family History  Problem Relation Age of Onset  . Lung cancer Mother   . Breast cancer Neg Hx   . Colon cancer Neg Hx   . Prostate cancer Neg Hx   . Heart disease Neg Hx   . Diabetes Maternal Aunt   . Cancer Paternal Grandmother    History  Substance Use Topics  . Smoking status: Never Smoker   . Smokeless tobacco: Never Used  . Alcohol Use: No    Review of Systems 10 Systems reviewed and are negative for acute change except as noted in the HPI.    Allergies  Contrast media and Iodine  Home Medications   Prior to Admission medications   Medication Sig Start Date End Date Taking? Authorizing Provider  atorvastatin (LIPITOR) 40 MG tablet Take 1 tablet (40 mg total) by mouth daily. 05/12/14   Mosie Lukes, MD  CIALIS 5 MG tablet Take 1 tablet (5 mg total) by mouth daily as needed for erectile dysfunction. 03/04/14   Mosie Lukes, MD  diphenoxylate-atropine (LOMOTIL) 2.5-0.025 MG per tablet Take 1-2 tablets every 4-6 hours as needed for diarrhea Patient not taking:  Reported on 08/16/2014 12/10/13   Irene Shipper, MD  gabapentin (NEURONTIN) 100 MG capsule Take 300 mg by mouth at bedtime.    Historical Provider, MD  glucose blood (ONE TOUCH ULTRA TEST) test strip Use as instructed 05/24/12   Burnice Logan, MD  lisinopril (PRINIVIL,ZESTRIL) 10 MG tablet TAKE 1 TABLET BY MOUTH 2 TIMES DAILY. 06/09/14   Mosie Lukes, MD  OVER THE COUNTER MEDICATION Take 1 capsule by mouth 2 (two) times daily. tumeric    Historical Provider, MD  oxyCODONE-acetaminophen (PERCOCET/ROXICET) 5-325 MG per tablet Take 1-2 tablets by mouth every 6 (six) hours as needed for moderate pain. 08/17/14    Megan N Dort, PA-C  sitaGLIPtin-metformin (JANUMET) 50-500 MG per tablet Take 1 tablet by mouth 2 (two) times daily with a meal. 05/21/14   Mosie Lukes, MD  TURMERIC PO Take 1 capsule by mouth 2 (two) times daily.    Historical Provider, MD   BP 141/86 mmHg  Pulse 108  Temp(Src) 99 F (37.2 C) (Oral)  Resp 18  Ht 6' (1.829 m)  Wt 194 lb (87.998 kg)  BMI 26.31 kg/m2  SpO2 98% Physical Exam  Constitutional: He is oriented to person, place, and time. He appears well-developed and well-nourished.  HENT:  Head: Normocephalic and atraumatic.  Right Ear: External ear normal.  Left Ear: External ear normal.  Nose: Nose normal.  Mouth/Throat: No oropharyngeal exudate.  The patient does have percussion tenderness over the frontal sinuses.  Eyes: EOM are normal. Pupils are equal, round, and reactive to light.  Neck: Neck supple.  Cardiovascular: Normal rate, regular rhythm, normal heart sounds and intact distal pulses.   Pulmonary/Chest: Effort normal and breath sounds normal.  Abdominal: Soft. Bowel sounds are normal. He exhibits no distension. There is tenderness. There is no rebound.  Patient does have resolving ecchymoses from prior surgery. His surgical incisions are healing well. His abdomen is soft without significant localizing tenderness.  Musculoskeletal: Normal range of motion. He exhibits no edema.  Neurological: He is alert and oriented to person, place, and time. He has normal strength. Coordination normal. GCS eye subscore is 4. GCS verbal subscore is 5. GCS motor subscore is 6.  Skin: Skin is warm, dry and intact.  Psychiatric: He has a normal mood and affect.    ED Course  Procedures (including critical care time) Labs Review Labs Reviewed  COMPREHENSIVE METABOLIC PANEL - Abnormal; Notable for the following:    Sodium 132 (*)    Glucose, Bld 382 (*)    Total Protein 8.5 (*)    AST 48 (*)    ALT 59 (*)    All other components within normal limits  URINALYSIS,  ROUTINE W REFLEX MICROSCOPIC - Abnormal; Notable for the following:    Specific Gravity, Urine 1.042 (*)    Glucose, UA >1000 (*)    All other components within normal limits  CBC WITH DIFFERENTIAL  TROPONIN I  URINE MICROSCOPIC-ADD ON    Imaging Review Ct Head Wo Contrast  08/20/2014   CLINICAL DATA:  59 year old diabetic hypertensive male with hyperlipidemia awoke with frontal headache and dizziness. Post appendectomy 5 days ago. Initial encounter.  EXAM: CT HEAD WITHOUT CONTRAST  CT MAXILLOFACIAL WITHOUT CONTRAST  TECHNIQUE: Multidetector CT imaging of the head and maxillofacial structures were performed using the standard protocol without intravenous contrast. Multiplanar CT image reconstructions of the maxillofacial structures were also generated.  COMPARISON:  05/28/2013 cervical spine CT.  11/22/2012 head CT.  FINDINGS: CT  HEAD FINDINGS  No intracranial hemorrhage.  Prominent slightly dense right sigmoid sinus, right transverse sinus and sigmoid sinus unchanged and probably an incidental rather than representing thrombosis. If there are progressive headaches, MR imaging may be considered to confirm this impression and exclude thrombosis.  No CT evidence of large acute infarct. If infarct is of high clinical concern (particularly posterior fossa infarct) MR may be considered.  No intracranial mass lesion noted on this unenhanced exam.  No hydrocephalus.  CT MAXILLOFACIAL FINDINGS  Visualized paranasal sinuses are clear.  Aeration of the left sphenoid sinus extends into the clinoid. The bony cover of the optic nerve as it courses through the superior aspect of the sphenoid sinus air cells is thin/dehiscent. Left sphenoid sinus is dominant with septation extending to the right carotid canal. Post lateral wall of the sphenoid sinuses formed by the carotid canal projecting into this region.  Concha bullosa middle turbinate larger on the right. Vertical positioning of the uncinate process. Infundibulum  patent bilaterally.  Visualized orbital structures unremarkable.  Atrophic left submandibular gland.  IMPRESSION: CT HEAD  No intracranial hemorrhage.  No CT evidence of large acute infarct. If infarct is of high clinical concern (particularly posterior fossa infarct) MR may be considered.  Prominent slightly dense right sigmoid sinus, right transverse sinus and sigmoid sinus without significant change and probably an incidental rather than representing thrombosis. If there are progressive headaches, MR imaging may be considered to confirm this impression and exclude thrombosis.  CT MAXILLOFACIAL  Visualized paranasal sinuses are clear.  Please see above for further detail.   Electronically Signed   By: Chauncey Cruel M.D.   On: 08/20/2014 14:17   Ct Maxillofacial Wo Cm  08/20/2014   CLINICAL DATA:  59 year old diabetic hypertensive male with hyperlipidemia awoke with frontal headache and dizziness. Post appendectomy 5 days ago. Initial encounter.  EXAM: CT HEAD WITHOUT CONTRAST  CT MAXILLOFACIAL WITHOUT CONTRAST  TECHNIQUE: Multidetector CT imaging of the head and maxillofacial structures were performed using the standard protocol without intravenous contrast. Multiplanar CT image reconstructions of the maxillofacial structures were also generated.  COMPARISON:  05/28/2013 cervical spine CT.  11/22/2012 head CT.  FINDINGS: CT HEAD FINDINGS  No intracranial hemorrhage.  Prominent slightly dense right sigmoid sinus, right transverse sinus and sigmoid sinus unchanged and probably an incidental rather than representing thrombosis. If there are progressive headaches, MR imaging may be considered to confirm this impression and exclude thrombosis.  No CT evidence of large acute infarct. If infarct is of high clinical concern (particularly posterior fossa infarct) MR may be considered.  No intracranial mass lesion noted on this unenhanced exam.  No hydrocephalus.  CT MAXILLOFACIAL FINDINGS  Visualized paranasal sinuses are  clear.  Aeration of the left sphenoid sinus extends into the clinoid. The bony cover of the optic nerve as it courses through the superior aspect of the sphenoid sinus air cells is thin/dehiscent. Left sphenoid sinus is dominant with septation extending to the right carotid canal. Post lateral wall of the sphenoid sinuses formed by the carotid canal projecting into this region.  Concha bullosa middle turbinate larger on the right. Vertical positioning of the uncinate process. Infundibulum patent bilaterally.  Visualized orbital structures unremarkable.  Atrophic left submandibular gland.  IMPRESSION: CT HEAD  No intracranial hemorrhage.  No CT evidence of large acute infarct. If infarct is of high clinical concern (particularly posterior fossa infarct) MR may be considered.  Prominent slightly dense right sigmoid sinus, right transverse sinus and sigmoid sinus  without significant change and probably an incidental rather than representing thrombosis. If there are progressive headaches, MR imaging may be considered to confirm this impression and exclude thrombosis.  CT MAXILLOFACIAL  Visualized paranasal sinuses are clear.  Please see above for further detail.   Electronically Signed   By: Chauncey Cruel M.D.   On: 08/20/2014 14:17     EKG Interpretation None     Consult: This case was reviewed with Dr. Janann Colonel. At this point with CT findings and the patient's onset of frontal headache with dizziness symptoms the patient will be sent to [facility] for MRI/MRA/MRV without contrast. Per Dr. Janann Colonel if there are abnormalities identified, he is to be contacted for further evaluation. If diagnostic study is negative and patient is improving no further neurology consult would be indicated.  Recheck 14:55. The patient ports improvement in pain after treatment with fluids, Benadryl and Reglan.Marland Kitchen His mental status is normal. MDM   Final diagnoses:  Headache  Status post appendectomy   The patient reports acute  onset of frontal headache with dizziness. He does not have a headache history. He does not appear to have an infectious etiology at this point. There is no fever or incremental development of symptoms. Incidentally he did recently have an appendectomy performed however there do not appear to be any complicating symptoms in association with that at this point. The patient reports his pain and recovery course with his abdominal surgery seem to be as he would expect having been through a cholecystectomy in the past. He has not had fever, increasing pain or intractable nausea or vomiting. After discussion with Dr. Janann Colonel regarding the CT findings and the patient's acute onset of headache and dizziness he will be sent to [facility] emergency department for MR studies.    Charlesetta Shanks, MD 08/20/14 [phone]

## 2014-08-20 NOTE — Discharge Instructions (Signed)

## 2014-08-20 NOTE — ED Notes (Signed)
Received report from [facility].  Pt seen at [facility] for headache and dizziness since this morning.  Neurology consulted and wanted MRI.  Pt transported for MRI.

## 2014-08-20 NOTE — ED Notes (Signed)
Patient transported to xray, EKG to be done on patient's return.

## 2014-08-20 NOTE — Telephone Encounter (Signed)
Patient went to ED
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
## 2014-08-20 NOTE — Telephone Encounter (Signed)
Spoke with patient who states that he is going to [facility] ED. States that he is dizzy and weak. Overall feels poorly.   FYI to PCP

## 2014-08-20 NOTE — ED Notes (Signed)
Pt c/o having a headache upon awakening at 5am and feeling dizzy. Pt had appendectomy on Sunday morning and was dc'ed home Monday. Pt sts he last took pain med at 2am. Pt denies fever/chills, n/v/d.

## 2014-08-20 NOTE — ED Notes (Signed)
Report called to Chad RN

## 2014-08-20 NOTE — Telephone Encounter (Signed)
Patient Name: Albert [name]  DOB: 06-22-1956    Nurse Assessment  Nurse: Mallie Mussel, RN, Alveta Heimlich Date/Time Eilene Ghazi Time): 08/20/2014 12:02:01 PM  Confirm and document reason for call. If symptomatic, describe symptoms. ---Caller states that he has a headache and dizziness which began around 5AM today. He rates his pain as 9-10 on 0-10 scale. This is the worst headache of his life. He has taken Tyhlenol but it didn't help. He was operated on Sunday morning after having his appendix removed. He was discharged on Monday. He is able to stand and walk, but has to be careful. He has the room spinning sensation. He has to grab onto things in order to walk.  Has the patient traveled out of the country within the last 30 days? ---No  Does the patient require triage? ---Yes  Related visit to physician within the last 2 weeks? ---No  Does the PT have any chronic conditions? (i.e. diabetes, asthma, etc.) ---Yes  List chronic conditions. ---Type II Diabetes, HTN, Hypercholesterolemia     Guidelines    Guideline Title Affirmed Question Affirmed Notes  Dizziness - Vertigo SEVERE dizziness (vertigo) (e.g., unable to walk without assistance)    Final Disposition User   Go to ED Now (or PCP triage) Mallie Mussel, RN, Alveta Heimlich    Comments  I called the backline and spoke with Colletta Maryland. Information given. States she is not a [employer] scientist, she will have to get a nurse on the line. I was connected to [facility]. Information given. They do not have an appointment within the specified time frame on profile for this outcome. Caller was notified. Verbalized understanding.

## 2014-09-01 ENCOUNTER — Encounter: Payer: Self-pay | Admitting: Physician Assistant

## 2014-09-01 ENCOUNTER — Telehealth: Payer: Self-pay | Admitting: Family Medicine

## 2014-09-01 ENCOUNTER — Ambulatory Visit (INDEPENDENT_AMBULATORY_CARE_PROVIDER_SITE_OTHER): Payer: 59 | Admitting: Physician Assistant

## 2014-09-01 VITALS — BP 127/77 | HR 91 | Temp 98.1°F | Resp 16 | Ht 70.0 in | Wt 185.1 lb

## 2014-09-01 DIAGNOSIS — R51 Headache: Secondary | ICD-10-CM

## 2014-09-01 DIAGNOSIS — G4700 Insomnia, unspecified: Secondary | ICD-10-CM

## 2014-09-01 DIAGNOSIS — K353 Acute appendicitis with localized peritonitis, without perforation or gangrene: Secondary | ICD-10-CM

## 2014-09-01 DIAGNOSIS — R519 Headache, unspecified: Secondary | ICD-10-CM

## 2014-09-01 MED ORDER — ZOLPIDEM TARTRATE 5 MG PO TABS: 5.0000 mg | ORAL_TABLET | Freq: Every evening | ORAL | Status: DC | PRN

## 2014-09-01 NOTE — Progress Notes (Signed)
Pre visit review using our clinic review tool, if applicable. No additional management support is needed unless otherwise documented below in the visit note/SLS  

## 2014-09-01 NOTE — Patient Instructions (Signed)
Please follow-up with your Surgeon as scheduled.  The wound site looks great and is healing nicely.  For the headache, I do think significant lack of sleep is contributing.  Use the Ambien at bedtime over the next couple of weeks to help get you back into your sleeping routine.  Use the Excedrin for acute headache.  Stay well hydrated.  Your liver enzymes were mildly elevated but this is likely due to the amount of Hydrocodone-Acetaminophen use.  Now that you've stopped this medications, I want you to return to the lab in 2 weeks for repeat liver enzyme testing.  Please call to make this appointment.  Follow-up with Dr. Charlett Blake as scheduled.  General Headache Without Cause A general headache is pain or discomfort felt around the head or neck area. The cause may not be found.  HOME CARE   Keep all doctor visits.  Only take medicines as told by your doctor.  Lie down in a dark, quiet room when you have a headache.  Keep a journal to find out if certain things bring on headaches. For example, write down:  What you eat and drink.  How much sleep you get.  Any change to your diet or medicines.  Relax by getting a massage or doing other relaxing activities.  Put ice or heat packs on the head and neck area as told by your doctor.  Lessen stress.  Sit up straight. Do not tighten (tense) your muscles.  Quit smoking if you smoke.  Lessen how much alcohol you drink.  Lessen how much caffeine you drink, or stop drinking caffeine.  Eat and sleep on a regular schedule.  Get 7 to 9 hours of sleep, or as told by your doctor.  Keep lights dim if bright lights bother you or make your headaches worse. GET HELP RIGHT AWAY IF:   Your headache becomes really bad.  You have a fever.  You have a stiff neck.  You have trouble seeing.  Your muscles are weak, or you lose muscle control.  You lose your balance or have trouble walking.  You feel like you will pass out (faint), or you pass  out.  You have really bad symptoms that are different than your first symptoms.  You have problems with the medicines given to you by your doctor.  Your medicines do not work.  Your headache feels different than the other headaches.  You feel sick to your stomach (nauseous) or throw up (vomit). MAKE SURE YOU:   Understand these instructions.  Will watch your condition.  Will get help right away if you are not doing well or get worse. Document Released: 05/09/2008 Document Revised: 10/23/2011 Document Reviewed: 07/21/2011 [facility] Patient Information 2015 [place], [place]. This information is not intended to replace advice given to you by your health care provider. Make sure you discuss any questions you have with your health care provider.

## 2014-09-01 NOTE — Progress Notes (Signed)
Patient presents to clinic today for hospital follow-up after having an emergency appendectomy. Chart and surgical procedure reviewed. Patient tolerated procedure well. Endorses minimal pain at incision site. Denies fever, chills, malaise. Some mild tenderness at incision. Endorses good urinary and bowel output. Of note, patient was seen in the ER after his appendectomy complaining of severe headache. Both CT and MRI performed without abnormal findings. Patient's symptoms resolved with good hydration. Patient denies recurrence of symptoms. Has recently switched shifts at his job. Is endorsing some difficulty with thin insomnia. Endorses difficulty both falling asleep and staying asleep.  Past Medical History  Diagnosis Date  . Diabetes mellitus   . Hypertension   . History of chicken pox     childhood  . Seasonal allergies   . History of kidney stones   . Hyperlipidemia   . 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 Sciatica of right side 12/22/2012  . Insomnia 02/25/2013  . Abdominal pain, unspecified site 03/11/2013    Left side at site of excision   . Diarrhea 03/11/2013  . Acute neck pain 06/04/2013  . Diverticulosis 10/26/2013  . Colon polyps     adenomatous  . Acute upper respiratory infections of unspecified site 05/11/2014  . DM (diabetes mellitus), type 2 with neurological complications 9/83/3825  . Hyperlipidemia 07/26/2014  . 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 Preventative health care 07/26/2014    Current Outpatient Prescriptions on File Prior to Visit  Medication Sig Dispense Refill  . atorvastatin (LIPITOR) 40 MG tablet Take 1 tablet (40 mg total) by mouth daily. 90 tablet 1  . CIALIS 5 MG tablet Take 1 tablet (5 mg total) by mouth daily as needed for erectile dysfunction. 30 tablet 5  . gabapentin (NEURONTIN) 100 MG capsule Take 300 mg by mouth at bedtime.    Marland Kitchen glucose blood (ONE TOUCH ULTRA TEST) test strip  Use as instructed 100 each 2  . lisinopril (PRINIVIL,ZESTRIL) 10 MG tablet TAKE 1 TABLET BY MOUTH 2 TIMES DAILY. 180 tablet 0  . meclizine (ANTIVERT) 50 MG tablet Take 1 tablet (50 mg total) by mouth 3 (three) times daily as needed for dizziness. 30 tablet 0  . OVER THE COUNTER MEDICATION Take 1 capsule by mouth 2 (two) times daily. tumeric    . sitaGLIPtin-metformin (JANUMET) 50-500 MG per tablet Take 1 tablet by mouth 2 (two) times daily with a meal. 180 tablet 1  . TURMERIC PO Take 1 capsule by mouth 2 (two) times daily.     No current facility-administered medications on file prior to visit.    Allergies  Allergen Reactions  . Contrast Media [Iodinated Diagnostic Agents] Swelling    Can take if premedicated with diphenhydramine  . Iodine Swelling    Family History  Problem Relation Age of Onset  . Lung cancer Mother   . Breast cancer Neg Hx   . Colon cancer Neg Hx   . Prostate cancer Neg Hx   . Heart disease Neg Hx   . Diabetes Maternal Aunt   . Cancer Paternal Grandmother     History   Social History  . Marital Status: Married    Spouse Name: N/A    Number of Children: N/A  . Years of Education: N/A   Social History Main Topics  . Smoking status: Never Smoker   . Smokeless tobacco: Never Used  . Alcohol Use: No  . Drug Use: No  .  Sexual Activity: None   Other Topics Concern  . None   Social History Narrative    Review of Systems - See HPI.  All other ROS are negative.  BP 127/77 mmHg  Pulse 91  Temp(Src) 98.1 F (36.7 C) (Oral)  Resp 16  Ht 5' 10"  (1.778 m)  Wt 185 lb 2 oz (83.972 kg)  BMI 26.56 kg/m2  SpO2 98%  Physical Exam  Constitutional: He is well-developed, well-nourished, and in no distress.  HENT:  Head: Normocephalic and atraumatic.  Right Ear: External ear normal.  Left Ear: External ear normal.  Nose: Nose normal.  Mouth/Throat: Oropharynx is clear and moist. No oropharyngeal exudate.  TM within normal limits bilaterally.  Eyes:  Conjunctivae are normal. Pupils are equal, round, and reactive to light.  Neck: Neck supple.  Cardiovascular: Normal rate, regular rhythm, normal heart sounds and intact distal pulses.   Pulmonary/Chest: Effort normal and breath sounds normal. No respiratory distress. He has no wheezes. He has no rales. He exhibits no tenderness.  Abdominal: Soft. Bowel sounds are normal. He exhibits no distension. There is no tenderness.  Skin: Skin is warm and dry.  3 incision sites noted of abdomen, the largest of which is over the umbilicus.  Routine healing noted.  No evidence or severe tenderness, erythema, warmth or drainage.  Vitals reviewed.   Recent Results (from the past 2160 hour(s))  HgB A1c     Status: Abnormal   Collection Time: 07/17/14  8:16 AM  Result Value Ref Range   Hgb A1c MFr Bld 10.2 (H) 4.6 - 6.5 %    Comment: Glycemic Control Guidelines for People with Diabetes:Non Diabetic:  <6%Goal of Therapy: <7%Additional Action Suggested:  >8%   Lipid panel     Status: Abnormal   Collection Time: 07/17/14  8:16 AM  Result Value Ref Range   Cholesterol 188 0 - 200 mg/dL    Comment: ATP III Classification       Desirable:  < 200 mg/dL               Borderline High:  200 - 239 mg/dL          High:  > = 240 mg/dL   Triglycerides 266.0 (H) 0.0 - 149.0 mg/dL    Comment: Normal:  <150 mg/dLBorderline High:  150 - 199 mg/dL   HDL 35.80 (L) >39.00 mg/dL   VLDL 53.2 (H) 0.0 - 40.0 mg/dL   Total CHOL/HDL Ratio 5     Comment:                Men          Women1/2 Average Risk     3.4          3.3Average Risk          5.0          4.42X Average Risk          9.6          7.13X Average Risk          15.0          11.0                       NonHDL 152.20     Comment: NOTE:  Non-HDL goal should be 30 mg/dL higher than patient's LDL goal (i.e. LDL goal of < 70 mg/dL, would have non-HDL goal of < 100 mg/dL)  Renal Function Panel  Status: Abnormal   Collection Time: 07/17/14  8:16 AM  Result Value Ref  Range   Sodium 138 135 - 145 mEq/L   Potassium 4.6 3.5 - 5.1 mEq/L   Chloride 106 96 - 112 mEq/L   CO2 17 (L) 19 - 32 mEq/L   Calcium 9.4 8.4 - 10.5 mg/dL   Albumin 4.2 3.5 - 5.2 g/dL   BUN 18 6 - 23 mg/dL   Creatinine, Ser 0.9 0.4 - 1.5 mg/dL   Glucose, Bld 327 (H) 70 - 99 mg/dL   Phosphorus 4.1 2.3 - 4.6 mg/dL   GFR 96.41 >60.00 mL/min  TSH     Status: None   Collection Time: 07/17/14  8:16 AM  Result Value Ref Range   TSH 1.31 0.35 - 4.50 uIU/mL  Hepatic function panel     Status: None   Collection Time: 07/17/14  8:16 AM  Result Value Ref Range   Total Bilirubin 0.5 0.2 - 1.2 mg/dL   Bilirubin, Direct 0.1 0.0 - 0.3 mg/dL   Alkaline Phosphatase 71 39 - 117 U/L   AST 19 0 - 37 U/L   ALT 21 0 - 53 U/L   Total Protein 7.8 6.0 - 8.3 g/dL   Albumin 4.2 3.5 - 5.2 g/dL  LDL cholesterol, direct     Status: None   Collection Time: 07/17/14  8:16 AM  Result Value Ref Range   Direct LDL 116.4 mg/dL    Comment: Optimal:  <100 mg/dLNear or Above Optimal:  100-129 mg/dLBorderline High:  130-159 mg/dLHigh:  160-189 mg/dLVery High:  >190 mg/dL  I-stat chem 8, ed     Status: Abnormal   Collection Time: 08/16/14 12:39 AM  Result Value Ref Range   Sodium 136 135 - 145 mmol/L   Potassium 3.8 3.5 - 5.1 mmol/L   Chloride 102 96 - 112 mEq/L   BUN 22 6 - 23 mg/dL   Creatinine, Ser 0.80 0.50 - 1.35 mg/dL   Glucose, Bld 358 (H) 70 - 99 mg/dL   Calcium, Ion 1.22 1.12 - 1.23 mmol/L   TCO2 19 0 - 100 mmol/L   Hemoglobin 17.7 (H) 13.0 - 17.0 g/dL   HCT 52.0 39.0 - 52.0 %  CBC with Differential     Status: Abnormal   Collection Time: 08/16/14  3:30 AM  Result Value Ref Range   WBC 13.7 (H) 4.0 - 10.5 K/uL   RBC 5.40 4.22 - 5.81 MIL/uL   Hemoglobin 16.1 13.0 - 17.0 g/dL   HCT 45.2 39.0 - 52.0 %   MCV 83.7 78.0 - 100.0 fL   MCH 29.8 26.0 - 34.0 pg   MCHC 35.6 30.0 - 36.0 g/dL   RDW 12.9 11.5 - 15.5 %   Platelets 196 150 - 400 K/uL   Neutrophils Relative % 82 (H) 43 - 77 %   Neutro Abs 11.1  (H) 1.7 - 7.7 K/uL   Lymphocytes Relative 14 12 - 46 %   Lymphs Abs 2.0 0.7 - 4.0 K/uL   Monocytes Relative 4 3 - 12 %   Monocytes Absolute 0.6 0.1 - 1.0 K/uL   Eosinophils Relative 0 0 - 5 %   Eosinophils Absolute 0.0 0.0 - 0.7 K/uL   Basophils Relative 0 0 - 1 %   Basophils Absolute 0.0 0.0 - 0.1 K/uL  Protime-INR     Status: None   Collection Time: 08/16/14  3:30 AM  Result Value Ref Range   Prothrombin Time 14.3 11.6 - 15.2 seconds  INR 1.10 0.00 - 1.49  Glucose, capillary     Status: Abnormal   Collection Time: 08/16/14  6:25 AM  Result Value Ref Range   Glucose-Capillary 344 (H) 70 - 99 mg/dL   Comment 1 Documented in Chart    Comment 2 Notify RN   Glucose, capillary     Status: Abnormal   Collection Time: 08/16/14  7:42 AM  Result Value Ref Range   Glucose-Capillary 328 (H) 70 - 99 mg/dL  Surgical pcr screen     Status: Abnormal   Collection Time: 08/16/14  7:43 AM  Result Value Ref Range   MRSA, PCR NEGATIVE NEGATIVE   Staphylococcus aureus POSITIVE (A) NEGATIVE    Comment:        The Xpert SA Assay (FDA approved for NASAL specimens in patients over 57 years of age), is one component of a comprehensive surveillance program.  Test performance has been validated by [employer] for patients greater than or equal to 15 year old. It is not intended to diagnose infection nor to guide or monitor treatment.   Glucose, capillary     Status: Abnormal   Collection Time: 08/16/14 12:37 PM  Result Value Ref Range   Glucose-Capillary 283 (H) 70 - 99 mg/dL   Comment 1 Notify RN   Glucose, capillary     Status: Abnormal   Collection Time: 08/16/14  4:14 PM  Result Value Ref Range   Glucose-Capillary 291 (H) 70 - 99 mg/dL   Comment 1 Notify RN   Glucose, capillary     Status: Abnormal   Collection Time: 08/16/14  6:27 PM  Result Value Ref Range   Glucose-Capillary 297 (H) 70 - 99 mg/dL   Comment 1 Documented in Chart    Comment 2 Notify RN   Glucose, capillary      Status: Abnormal   Collection Time: 08/17/14  7:47 AM  Result Value Ref Range   Glucose-Capillary 324 (H) 70 - 99 mg/dL  Comprehensive metabolic panel     Status: Abnormal   Collection Time: 08/20/14  1:55 PM  Result Value Ref Range   Sodium 132 (L) 135 - 145 mmol/L    Comment: Please note change in reference range.   Potassium 4.0 3.5 - 5.1 mmol/L    Comment: Please note change in reference range.   Chloride 99 96 - 112 mEq/L   CO2 20 19 - 32 mmol/L   Glucose, Bld 382 (H) 70 - 99 mg/dL   BUN 20 6 - 23 mg/dL   Creatinine, Ser 0.75 0.50 - 1.35 mg/dL   Calcium 9.5 8.4 - 10.5 mg/dL   Total Protein 8.5 (H) 6.0 - 8.3 g/dL   Albumin 4.2 3.5 - 5.2 g/dL   AST 48 (H) 0 - 37 U/L   ALT 59 (H) 0 - 53 U/L   Alkaline Phosphatase 88 39 - 117 U/L   Total Bilirubin 1.0 0.3 - 1.2 mg/dL   GFR calc non Af Amer >90 >90 mL/min   GFR calc Af Amer >90 >90 mL/min    Comment: (NOTE) The eGFR has been calculated using the CKD EPI equation. This calculation has not been validated in all clinical situations. eGFR's persistently <90 mL/min signify possible Chronic Kidney Disease.    Anion gap 13 5 - 15  CBC with Differential     Status: None   Collection Time: 08/20/14  1:55 PM  Result Value Ref Range   WBC 9.0 4.0 - 10.5 K/uL   RBC  5.67 4.22 - 5.81 MIL/uL   Hemoglobin 16.6 13.0 - 17.0 g/dL   HCT 46.9 39.0 - 52.0 %   MCV 82.7 78.0 - 100.0 fL   MCH 29.3 26.0 - 34.0 pg   MCHC 35.4 30.0 - 36.0 g/dL   RDW 13.0 11.5 - 15.5 %   Platelets 228 150 - 400 K/uL   Neutrophils Relative % 66 43 - 77 %   Neutro Abs 5.9 1.7 - 7.7 K/uL   Lymphocytes Relative 20 12 - 46 %   Lymphs Abs 1.8 0.7 - 4.0 K/uL   Monocytes Relative 11 3 - 12 %   Monocytes Absolute 1.0 0.1 - 1.0 K/uL   Eosinophils Relative 3 0 - 5 %   Eosinophils Absolute 0.2 0.0 - 0.7 K/uL   Basophils Relative 0 0 - 1 %   Basophils Absolute 0.0 0.0 - 0.1 K/uL  Troponin I     Status: None   Collection Time: 08/20/14  1:55 PM  Result Value Ref Range     Troponin I <0.03 <0.031 ng/mL    Comment:        NO INDICATION OF MYOCARDIAL INJURY. Please note change in reference range.   Urinalysis, Routine w reflex microscopic     Status: Abnormal   Collection Time: 08/20/14  2:15 PM  Result Value Ref Range   Color, Urine YELLOW YELLOW   APPearance CLEAR CLEAR   Specific Gravity, Urine 1.042 (H) 1.005 - 1.030   pH 5.5 5.0 - 8.0   Glucose, UA >1000 (A) NEGATIVE mg/dL   Hgb urine dipstick NEGATIVE NEGATIVE   Bilirubin Urine NEGATIVE NEGATIVE   Ketones, ur NEGATIVE NEGATIVE mg/dL   Protein, ur NEGATIVE NEGATIVE mg/dL   Urobilinogen, UA 1.0 0.0 - 1.0 mg/dL   Nitrite NEGATIVE NEGATIVE   Leukocytes, UA NEGATIVE NEGATIVE  Urine microscopic-add on     Status: None   Collection Time: 08/20/14  2:15 PM  Result Value Ref Range   Squamous Epithelial / LPF RARE RARE   WBC, UA 0-2 <3 WBC/hpf   RBC / HPF 0-2 <3 RBC/hpf   Bacteria, UA RARE RARE   Urine-Other MUCOUS PRESENT     Assessment/Plan: Acute appendicitis Status post emergent appendectomy. Patient doing very well. Minimal pain. Incision sites examined without abnormal or concerning findings. Abdominal exam within normal limits. Patient to continue supportive measures. Follow-up with surgeon as scheduled. Alarm signs and symptoms reviewed with patient. He is to call 911 or proceed to an ER if the symptoms occur.   Headache Workup unremarkable. Resolved an ER with IV fluids. No recurrence. Patient is to continue to monitor for recurrence of symptoms. Stay well hydrated.   Insomnia Onset coincides with shift change at work. Discussed sleep hygiene practices. Will give short course of Ambien to help get patient in a normal sleep pattern. Follow-up with primary care provider in one month.

## 2014-09-01 NOTE — Telephone Encounter (Signed)
Ok patient scheduled lab appointment 09/22/14

## 2014-09-01 NOTE — Telephone Encounter (Signed)
Per [facility], lab is not due until [2] weeks from [place] today, orders will be placed at that time, as to not be drawn early/SLS

## 2014-09-01 NOTE — Telephone Encounter (Signed)
Pt states PA requested lab work to check liver, requesting orders.

## 2014-09-02 ENCOUNTER — Ambulatory Visit (INDEPENDENT_AMBULATORY_CARE_PROVIDER_SITE_OTHER): Payer: 59 | Admitting: Family Medicine

## 2014-09-02 ENCOUNTER — Encounter: Payer: Self-pay | Admitting: Family Medicine

## 2014-09-02 VITALS — BP 120/84 | HR 82 | Ht 70.0 in | Wt 190.0 lb

## 2014-09-02 DIAGNOSIS — M7552 Bursitis of left shoulder: Secondary | ICD-10-CM

## 2014-09-02 NOTE — Patient Instructions (Signed)
Very good to see you i am am glad you are doing well.  ICe is your friend after activity  Exercises 3 times a week.  See me again in 4 weeks if not perfect

## 2014-09-02 NOTE — Assessment & Plan Note (Signed)
Patient is doing very well even without doing home exercises. Patient is motivated and I think he will do very well with the conservative therapy. Patient will do some icing regimen and we discussed other over-the-counter supplementations. His lungs patient does well he can follow-up with me on an as-needed basis if he is not pain free in the next 4 weeks.
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
## 2014-09-06 NOTE — Assessment & Plan Note (Signed)
Status post emergent appendectomy. Patient doing very well. Minimal pain. Incision sites examined without abnormal or concerning findings. Abdominal exam within normal limits. Patient to continue supportive measures. Follow-up with surgeon as scheduled. Alarm signs and symptoms reviewed with patient. He is to call 911 or proceed to an ER if the symptoms occur.

## 2014-09-06 NOTE — Assessment & Plan Note (Signed)
Workup unremarkable. Resolved an ER with IV fluids. No recurrence. Patient is to continue to monitor for recurrence of symptoms. Stay well hydrated.

## 2014-09-06 NOTE — Assessment & Plan Note (Signed)
Onset coincides with shift change at work. Discussed sleep hygiene practices. Will give short course of Ambien to help get patient in a normal sleep pattern. Follow-up with primary care provider in one month.

## 2014-09-22 ENCOUNTER — Other Ambulatory Visit: Payer: 59

## 2014-10-05 ENCOUNTER — Ambulatory Visit: Payer: Self-pay | Admitting: Family Medicine

## 2014-10-23 ENCOUNTER — Ambulatory Visit: Payer: Self-pay | Admitting: Family Medicine

## 2014-10-26 ENCOUNTER — Encounter: Payer: Self-pay | Admitting: Family Medicine

## 2014-10-26 ENCOUNTER — Ambulatory Visit (INDEPENDENT_AMBULATORY_CARE_PROVIDER_SITE_OTHER): Payer: 59 | Admitting: Family Medicine

## 2014-10-26 VITALS — BP 102/64 | HR 96 | Temp 98.2°F | Ht 72.0 in | Wt 193.2 lb

## 2014-10-26 DIAGNOSIS — R519 Headache, unspecified: Secondary | ICD-10-CM

## 2014-10-26 DIAGNOSIS — E1149 Type 2 diabetes mellitus with other diabetic neurological complication: Secondary | ICD-10-CM

## 2014-10-26 DIAGNOSIS — E1140 Type 2 diabetes mellitus with diabetic neuropathy, unspecified: Secondary | ICD-10-CM

## 2014-10-26 DIAGNOSIS — E1169 Type 2 diabetes mellitus with other specified complication: Secondary | ICD-10-CM

## 2014-10-26 DIAGNOSIS — M5416 Radiculopathy, lumbar region: Secondary | ICD-10-CM

## 2014-10-26 DIAGNOSIS — E782 Mixed hyperlipidemia: Secondary | ICD-10-CM

## 2014-10-26 DIAGNOSIS — E119 Type 2 diabetes mellitus without complications: Secondary | ICD-10-CM

## 2014-10-26 DIAGNOSIS — R51 Headache: Secondary | ICD-10-CM

## 2014-10-26 DIAGNOSIS — K353 Acute appendicitis with localized peritonitis, without perforation or gangrene: Secondary | ICD-10-CM

## 2014-10-26 DIAGNOSIS — I10 Essential (primary) hypertension: Secondary | ICD-10-CM

## 2014-10-26 DIAGNOSIS — E669 Obesity, unspecified: Secondary | ICD-10-CM

## 2014-10-26 DIAGNOSIS — G609 Hereditary and idiopathic neuropathy, unspecified: Secondary | ICD-10-CM

## 2014-10-26 DIAGNOSIS — M5441 Lumbago with sciatica, right side: Secondary | ICD-10-CM

## 2014-10-26 DIAGNOSIS — R0789 Other chest pain: Secondary | ICD-10-CM

## 2014-10-26 MED ORDER — RANITIDINE HCL 300 MG PO TABS: 300.0000 mg | ORAL_TABLET | Freq: Every day | ORAL | Status: DC

## 2014-10-26 MED ORDER — ASPIRIN EC 81 MG PO TBEC: 81.0000 mg | DELAYED_RELEASE_TABLET | Freq: Every day | ORAL | Status: AC

## 2014-10-26 MED ORDER — TRAMADOL HCL 50 MG PO TABS: 50.0000 mg | ORAL_TABLET | Freq: Two times a day (BID) | ORAL | Status: DC | PRN

## 2014-10-26 MED ORDER — GABAPENTIN 300 MG PO CAPS: 300.0000 mg | ORAL_CAPSULE | Freq: Every day | ORAL | Status: DC

## 2014-10-26 MED ORDER — NITROGLYCERIN 0.4 MG SL SUBL: 0.4000 mg | SUBLINGUAL_TABLET | SUBLINGUAL | Status: DC | PRN

## 2014-10-26 NOTE — Progress Notes (Signed)
Pre visit review using our clinic review tool, if applicable. No additional management support is needed unless otherwise documented below in the visit note. 

## 2014-10-27 LAB — COMPREHENSIVE METABOLIC PANEL
ALBUMIN: 4.3 g/dL (ref 3.5–5.2)
ALT: 21 U/L (ref 0–53)
AST: 14 U/L (ref 0–37)
Alkaline Phosphatase: 82 U/L (ref 39–117)
BUN: 10 mg/dL (ref 6–23)
CALCIUM: 9.6 mg/dL (ref 8.4–10.5)
CHLORIDE: 99 meq/L (ref 96–112)
CO2: 27 mEq/L (ref 19–32)
Creatinine, Ser: 0.84 mg/dL (ref 0.40–1.50)
GFR: 100.29 mL/min (ref >60.00)
Glucose, Bld: 263 mg/dL — ABNORMAL HIGH (ref 70–99)
POTASSIUM: 3.8 meq/L (ref 3.5–5.1)
SODIUM: 132 meq/L — AB (ref 135–145)
Total Bilirubin: 0.7 mg/dL (ref 0.2–1.2)
Total Protein: 7.5 g/dL (ref 6.0–8.3)

## 2014-10-27 LAB — CBC
HCT: 45.3 % (ref 39.0–52.0)
Hemoglobin: 15.6 g/dL (ref 13.0–17.0)
MCHC: 34.4 g/dL (ref 30.0–36.0)
MCV: 87.9 fl (ref 78.0–100.0)
Platelets: 182 10*3/uL (ref 150.0–400.0)
RBC: 5.15 Mil/uL (ref 4.22–5.81)
RDW: 14.7 % (ref 11.5–15.5)
WBC: 7.5 10*3/uL (ref 4.0–10.5)

## 2014-10-27 LAB — LIPID PANEL
Cholesterol: 177 mg/dL (ref 0–200)
HDL: 34.3 mg/dL — ABNORMAL LOW (ref >39.00)
NONHDL: 142.7
Total CHOL/HDL Ratio: 5
Triglycerides: 229 mg/dL — ABNORMAL HIGH (ref 0.0–149.0)
VLDL: 45.8 mg/dL — ABNORMAL HIGH (ref 0.0–40.0)

## 2014-10-27 LAB — TSH: TSH: 1.28 u[IU]/mL (ref 0.35–4.50)

## 2014-10-27 LAB — LDL CHOLESTEROL, DIRECT: LDL DIRECT: 109 mg/dL

## 2014-10-27 LAB — HEMOGLOBIN A1C: Hgb A1c MFr Bld: 11.9 % — ABNORMAL HIGH (ref 4.6–6.5)

## 2014-10-29 ENCOUNTER — Other Ambulatory Visit: Payer: Self-pay | Admitting: Family Medicine

## 2014-10-29 NOTE — Telephone Encounter (Signed)
Opened in error

## 2014-10-30 ENCOUNTER — Ambulatory Visit: Payer: 59 | Admitting: Family Medicine

## 2014-10-30 ENCOUNTER — Encounter: Payer: Self-pay | Admitting: *Deleted

## 2014-10-30 ENCOUNTER — Telehealth: Payer: Self-pay | Admitting: *Deleted

## 2014-10-30 ENCOUNTER — Other Ambulatory Visit: Payer: Self-pay | Admitting: Family Medicine

## 2014-10-30 DIAGNOSIS — E1149 Type 2 diabetes mellitus with other diabetic neurological complication: Secondary | ICD-10-CM

## 2014-10-30 MED ORDER — INSULIN GLARGINE 100 UNIT/ML SOLOSTAR PEN: 10.0000 [IU] | PEN_INJECTOR | Freq: Every day | SUBCUTANEOUS | Status: DC

## 2014-10-30 NOTE — Telephone Encounter (Signed)
Notified patient that Insulin pen sent to [facility] and to pick up pen and bring to nurse visit.  Patient stated understanding.

## 2014-10-30 NOTE — Progress Notes (Signed)
Gave patient the following instructions per Dr. Frederik Pear lab notes:  Lantus Flexpen:  Start with 10 units subcutaneous daily.  Please check your sugar with each meal and before bed for a while.  If your blood sugars all stay above 130, you can increase the Lantus by 2 every units every 3 days.  Once your sugar drops below 130, you should stop increasing.  Please keep a sugar log and come in to review in 4-8 weeks.  Patient demonstrated correct use of insulin pen.  Patient verbalized understanding of instructions using read back.   Patient verbalized signs of low blood sugar and what to do if these symptoms occur.

## 2014-11-01 ENCOUNTER — Encounter: Payer: Self-pay | Admitting: Family Medicine

## 2014-11-01 DIAGNOSIS — R0789 Other chest pain: Secondary | ICD-10-CM | POA: Insufficient documentation

## 2014-11-01 NOTE — Assessment & Plan Note (Signed)
Well controlled, no changes to meds. Encouraged heart healthy diet such as the DASH diet and exercise as tolerated.  °
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
## 2014-11-01 NOTE — Assessment & Plan Note (Signed)
Encouraged increased hydration, 64 ounces of clear fluids daily. Minimize alcohol and caffeine. Eat small frequent meals with lean proteins and complex carbs. Avoid high and low blood sugars. Get adequate sleep, 7-8 hours a night. Needs exercise daily preferably in the morning. Uses Excedrine Migraine prn.

## 2014-11-01 NOTE — Assessment & Plan Note (Signed)
Continues to struggle with pain but manages most days. Consider referral to pain management.

## 2014-11-01 NOTE — Assessment & Plan Note (Addendum)
Numerous risk factors for cardiac disease but atypical presentation. No pain day of visit. Referred to cardiology for further consideration. He will seek care if recurrent symptoms occur. EKG shows no concerning changes just stable RBB

## 2014-11-01 NOTE — Assessment & Plan Note (Addendum)
Poor control, needs to minimize simple carbs and given rx to start Lantus 10 units daily and then titrate up by 2 units every 3 rd day as lon as numbers running above 100. Is asked to return for training on the Lantus pen.

## 2014-11-01 NOTE — Assessment & Plan Note (Signed)
S/p an injury at work, has been healing well.

## 2014-11-01 NOTE — Assessment & Plan Note (Signed)
Given refill on Gabapentin today

## 2014-11-03 ENCOUNTER — Other Ambulatory Visit: Payer: Self-pay | Admitting: Family Medicine

## 2014-11-03 NOTE — Telephone Encounter (Signed)
Med filled.  

## 2014-11-12 ENCOUNTER — Telehealth: Payer: Self-pay | Admitting: Family Medicine

## 2014-11-12 MED ORDER — BAYER CONTOUR MONITOR W/DEVICE KIT: PACK | Status: DC

## 2014-11-12 MED ORDER — GLUCOSE BLOOD VI STRP: ORAL_STRIP | Status: DC

## 2014-11-12 NOTE — Telephone Encounter (Signed)
Called the patient to request a call back to let us know what type meter/strips to send in. Left message to call back

## 2014-11-12 NOTE — Telephone Encounter (Signed)
Called the patient back to inform received fax from pharmacy informing to send in Contour meter and strips, patient has been informed.

## 2014-11-12 NOTE — Telephone Encounter (Signed)
Caller name: [place], Meador Relation to pt: self  Call back number: [phone] Pharmacy:  Reason for call:  Pt requesting a new meter and test strip

## 2014-11-13 ENCOUNTER — Other Ambulatory Visit: Payer: Self-pay | Admitting: Family Medicine

## 2014-11-13 NOTE — Telephone Encounter (Signed)
Updated medication list. Pharmacy states insurance does cover one touch meter and strips.  Removed the contour meter and strips.

## 2014-12-02 ENCOUNTER — Ambulatory Visit (INDEPENDENT_AMBULATORY_CARE_PROVIDER_SITE_OTHER): Payer: 59 | Admitting: Cardiology

## 2014-12-02 ENCOUNTER — Encounter: Payer: Self-pay | Admitting: Cardiology

## 2014-12-02 VITALS — BP 124/80 | HR 93 | Ht 72.0 in | Wt 191.8 lb

## 2014-12-02 DIAGNOSIS — R0789 Other chest pain: Secondary | ICD-10-CM

## 2014-12-02 DIAGNOSIS — I10 Essential (primary) hypertension: Secondary | ICD-10-CM

## 2014-12-02 DIAGNOSIS — R079 Chest pain, unspecified: Secondary | ICD-10-CM

## 2014-12-02 DIAGNOSIS — E785 Hyperlipidemia, unspecified: Secondary | ICD-10-CM | POA: Diagnosis not present

## 2014-12-02 NOTE — Assessment & Plan Note (Signed)
Continue statin. 

## 2014-12-02 NOTE — Assessment & Plan Note (Signed)
Blood pressure controlled. Continue present medications. 

## 2014-12-02 NOTE — Patient Instructions (Signed)
Your physician recommends that you schedule a follow-up appointment in: AS NEEDED PENDING TEST RESULTS  Your physician has requested that you have an exercise tolerance test. For further information please visit [email or web address]. Please also follow instruction sheet, as given.     Exercise Stress Electrocardiogram An exercise stress electrocardiogram is a test to check how blood flows to your heart. It is done to find areas of poor blood flow. You will need to walk on a treadmill for this test. The electrocardiogram will record your heartbeat when you are at rest and when you are exercising. BEFORE THE PROCEDURE  Do not have drinks with caffeine or foods with caffeine for 24 hours before the test, or as told by your doctor. This includes coffee, tea (even decaf tea), sodas, chocolate, and cocoa.  Follow your doctor's instructions about eating and drinking before the test.  Ask your doctor what medicines you should or should not take before the test. Take your medicines with water unless told by your doctor not to.  If you use an inhaler, bring it with you to the test.  Bring a snack to eat after the test.  Do not  smoke for 4 hours before the test.  Do not put lotions, powders, creams, or oils on your chest before the test.  Wear comfortable shoes and clothing. PROCEDURE  You will have patches put on your chest. Small areas of your chest may need to be shaved. Wires will be connected to the patches.  Your heart rate will be watched while you are resting and while you are exercising.  You will walk on the treadmill. The treadmill will slowly get faster to raise your heart rate.  The test will take about 1-2 hours. AFTER THE PROCEDURE  Your heart rate and blood pressure will be watched after the test.  You may return to your normal diet, activities, and medicines or as told by your doctor. Document Released: 01/17/2008 Document Revised: 12/15/2013 Document Reviewed:  04/07/2013 ExitCare Patient Information 2015 [employer]. This information is not intended to replace advice given to you by your health care provider. Make sure you discuss any questions you have with your health care provider.   

## 2014-12-02 NOTE — Addendum Note (Signed)
Addended by: Cristopher Estimable on: 12/02/2014 10:41 AM   Modules accepted: Orders

## 2014-12-02 NOTE — Progress Notes (Signed)
HPI: 59 year old male for evaluation of chest pain. Patient states he had a cardiac catheterization in 2005 in [place] for chest pain with no coronary disease. No records available. Exercise treadmill May 2014 showed no ST changes. Laboratories March 2016 showed normal hemoglobin, TSH and BUN 10/creatinine 0.84. Liver functions normal. Patient states in March he was under stress because of his daughter becoming married. He had several episodes of chest pain. These episodes occurred in the left chest and radiated to the left upper extremity. They were described as a sharp pain. Not exertional, pleuritic, positional or related to food. Resolved after 10 minutes. No associated symptoms. Otherwise no dyspnea on exertion, orthopnea, PND, pedal edema, exertional chest pain or syncope.  Current Outpatient Prescriptions  Medication Sig Dispense Refill  . aspirin EC 81 MG tablet Take 1 tablet (81 mg total) by mouth daily.    Marland Kitchen atorvastatin (LIPITOR) 40 MG tablet Take 1 tablet (40 mg total) by mouth daily. 90 tablet 1  . Blood Glucose Monitoring Suppl (ONE TOUCH ULTRA SYSTEM KIT) W/DEVICE KIT Use as directed to check blood sugar.  Diagnosis code E11.40    . gabapentin (NEURONTIN) 300 MG capsule Take 1 capsule (300 mg total) by mouth at bedtime. 90 capsule 3  . glucose blood test strip Use as directed twice daily to check blood sugar.  Diagnosis code E11.40    . Insulin Glargine (LANTUS) 100 UNIT/ML Solostar Pen Inject 10 Units into the skin daily. 15 mL 6  . lisinopril (PRINIVIL,ZESTRIL) 10 MG tablet TAKE 1 TABLET BY MOUTH 2 TIMES DAILY. 180 tablet 0  . loperamide (IMODIUM A-D) 2 MG tablet Take 2 mg by mouth 4 (four) times daily as needed for diarrhea or loose stools.    . nitroGLYCERIN (NITROSTAT) 0.4 MG SL tablet Place 1 tablet (0.4 mg total) under the tongue every 5 (five) minutes as needed for chest pain. X 3 doses 25 tablet 1  . ranitidine (ZANTAC) 300 MG tablet Take 1 tablet (300 mg total) by mouth  at bedtime. 30 tablet 2  . sitaGLIPtin-metformin (JANUMET) 50-500 MG per tablet Take 1 tablet by mouth 2 (two) times daily with a meal. 180 tablet 1  . traMADol (ULTRAM) 50 MG tablet Take 1 tablet (50 mg total) by mouth every 12 (twelve) hours as needed. 40 tablet 0  . TURMERIC PO Take 1 capsule by mouth 2 (two) times daily.     No current facility-administered medications for this visit.    Allergies  Allergen Reactions  . Contrast Media [Iodinated Diagnostic Agents] Swelling    Can take if premedicated with diphenhydramine  . Iodine Swelling     Past Medical History  Diagnosis Date  . Hypertension   . History of chicken pox     childhood  . Seasonal allergies   . History of kidney stones   . 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 Sciatica of right side 12/22/2012  . Insomnia 02/25/2013  . Diverticulosis 10/26/2013  . Colon polyps     adenomatous  . DM (diabetes mellitus), type 2 with neurological complications 3/73/4287  . Hyperlipidemia 07/26/2014  . 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 Preventative health care 07/26/2014    Past Surgical History  Procedure Laterality Date  . Hernia repair    . Abdominal hernia repair    . Rotator cuff repair    . Cholecystectomy    . Mass excision  05/22/2012  Procedure: EXCISION MASS;  Surgeon: Joyice Faster. Cornett, MD;  Location: [facility];  Service: General;  Laterality: Left;  excision abdominal wall mass  . Laparoscopic appendectomy N/A 08/16/2014    Procedure: APPENDECTOMY LAPAROSCOPIC;  Surgeon: Erroll Luna, MD;  Location: [facility] OR;  Service: General;  Laterality: N/A;    History   Social History  . Marital Status: Married    Spouse Name: N/A  . Number of Children: 3  . Years of Education: N/A   Occupational History  .      Security officer [employer]   Social History Main Topics  . Smoking status: Never Smoker   . Smokeless tobacco: Never Used  .  Alcohol Use: No  . Drug Use: No  . Sexual Activity: Not on file   Other Topics Concern  . Not on file   Social History Narrative    Family History  Problem Relation Age of Onset  . Lung cancer Mother   . Breast cancer Neg Hx   . Colon cancer Neg Hx   . Prostate cancer Neg Hx   . Heart disease Neg Hx   . Diabetes Maternal Aunt   . Cancer Paternal Grandmother     ROS: some arthralgias but no fevers or chills, productive cough, hemoptysis, dysphasia, odynophagia, melena, hematochezia, dysuria, hematuria, rash, seizure activity, orthopnea, PND, pedal edema, claudication. Remaining systems are negative.  Physical Exam:   Blood pressure 124/80, pulse 93, height 6' (1.829 m), weight 191 lb 12.8 oz (87 kg), SpO2 99 %.  General:  Well developed/well nourished in NAD Skin warm/dry Patient not depressed No peripheral clubbing Back-normal HEENT-normal/normal eyelids Neck supple/normal carotid upstroke bilaterally; no bruits; no JVD; no thyromegaly chest - CTA/ normal expansion CV - RRR/normal S1 and S2; no murmurs, rubs or gallops;  PMI nondisplaced Abdomen -NT/ND, no HSM, no mass, + bowel sounds, no bruit 2+ femoral pulses, no bruits Ext-no edema, chords, 2+ DP Neuro-grossly nonfocal  ECG 10/26/2014-sinus rhythm, right bundle branch block.

## 2014-12-02 NOTE — Assessment & Plan Note (Signed)
Given 20 years of diabetes mellitus we will risk stratify with an exercise treadmill.

## 2014-12-03 ENCOUNTER — Encounter: Payer: Self-pay | Admitting: Family Medicine

## 2014-12-08 ENCOUNTER — Encounter: Payer: Self-pay | Admitting: Family Medicine

## 2014-12-08 ENCOUNTER — Ambulatory Visit (INDEPENDENT_AMBULATORY_CARE_PROVIDER_SITE_OTHER): Payer: 59 | Admitting: Family Medicine

## 2014-12-08 VITALS — HR 83 | Temp 98.4°F | Ht 72.0 in | Wt 194.4 lb

## 2014-12-08 DIAGNOSIS — E119 Type 2 diabetes mellitus without complications: Secondary | ICD-10-CM

## 2014-12-08 DIAGNOSIS — I10 Essential (primary) hypertension: Secondary | ICD-10-CM

## 2014-12-08 DIAGNOSIS — E1169 Type 2 diabetes mellitus with other specified complication: Secondary | ICD-10-CM

## 2014-12-08 DIAGNOSIS — E1149 Type 2 diabetes mellitus with other diabetic neurological complication: Secondary | ICD-10-CM

## 2014-12-08 DIAGNOSIS — E782 Mixed hyperlipidemia: Secondary | ICD-10-CM | POA: Diagnosis not present

## 2014-12-08 DIAGNOSIS — E669 Obesity, unspecified: Secondary | ICD-10-CM

## 2014-12-08 DIAGNOSIS — E1140 Type 2 diabetes mellitus with diabetic neuropathy, unspecified: Secondary | ICD-10-CM

## 2014-12-08 DIAGNOSIS — R0781 Pleurodynia: Secondary | ICD-10-CM

## 2014-12-08 DIAGNOSIS — E785 Hyperlipidemia, unspecified: Secondary | ICD-10-CM

## 2014-12-08 NOTE — Patient Instructions (Signed)

## 2014-12-09 ENCOUNTER — Encounter: Payer: Self-pay | Admitting: Family Medicine

## 2014-12-18 ENCOUNTER — Observation Stay (HOSPITAL_COMMUNITY)
Admission: EM | Admit: 2014-12-18 | Discharge: 2014-12-19 | Disposition: A | Discharge status: Home / Self Care | Payer: 59 | Attending: Internal Medicine | Admitting: Internal Medicine

## 2014-12-18 ENCOUNTER — Encounter (HOSPITAL_COMMUNITY): Payer: Self-pay

## 2014-12-18 ENCOUNTER — Emergency Department (HOSPITAL_COMMUNITY): Payer: 59

## 2014-12-18 DIAGNOSIS — I10 Essential (primary) hypertension: Secondary | ICD-10-CM | POA: Diagnosis not present

## 2014-12-18 DIAGNOSIS — G609 Hereditary and idiopathic neuropathy, unspecified: Secondary | ICD-10-CM | POA: Diagnosis not present

## 2014-12-18 DIAGNOSIS — I951 Orthostatic hypotension: Secondary | ICD-10-CM

## 2014-12-18 DIAGNOSIS — E785 Hyperlipidemia, unspecified: Secondary | ICD-10-CM | POA: Insufficient documentation

## 2014-12-18 DIAGNOSIS — E782 Mixed hyperlipidemia: Secondary | ICD-10-CM | POA: Diagnosis present

## 2014-12-18 DIAGNOSIS — R55 Syncope and collapse: Principal | ICD-10-CM | POA: Insufficient documentation

## 2014-12-18 DIAGNOSIS — Z87442 Personal history of urinary calculi: Secondary | ICD-10-CM | POA: Diagnosis not present

## 2014-12-18 DIAGNOSIS — E780 Pure hypercholesterolemia: Secondary | ICD-10-CM | POA: Insufficient documentation

## 2014-12-18 DIAGNOSIS — Z8619 Personal history of other infectious and parasitic diseases: Secondary | ICD-10-CM | POA: Insufficient documentation

## 2014-12-18 DIAGNOSIS — Z86010 Personal history of colonic polyps: Secondary | ICD-10-CM | POA: Insufficient documentation

## 2014-12-18 DIAGNOSIS — Z8589 Personal history of malignant neoplasm of other organs and systems: Secondary | ICD-10-CM | POA: Diagnosis not present

## 2014-12-18 DIAGNOSIS — R079 Chest pain, unspecified: Secondary | ICD-10-CM | POA: Diagnosis not present

## 2014-12-18 DIAGNOSIS — E1149 Type 2 diabetes mellitus with other diabetic neurological complication: Secondary | ICD-10-CM | POA: Diagnosis present

## 2014-12-18 DIAGNOSIS — R197 Diarrhea, unspecified: Secondary | ICD-10-CM | POA: Diagnosis not present

## 2014-12-18 DIAGNOSIS — E1140 Type 2 diabetes mellitus with diabetic neuropathy, unspecified: Secondary | ICD-10-CM | POA: Diagnosis not present

## 2014-12-18 DIAGNOSIS — G4700 Insomnia, unspecified: Secondary | ICD-10-CM | POA: Insufficient documentation

## 2014-12-18 DIAGNOSIS — Z8719 Personal history of other diseases of the digestive system: Secondary | ICD-10-CM | POA: Diagnosis not present

## 2014-12-18 DIAGNOSIS — R0789 Other chest pain: Secondary | ICD-10-CM | POA: Diagnosis not present

## 2014-12-18 DIAGNOSIS — I209 Angina pectoris, unspecified: Secondary | ICD-10-CM

## 2014-12-18 DIAGNOSIS — R61 Generalized hyperhidrosis: Secondary | ICD-10-CM | POA: Diagnosis not present

## 2014-12-18 DIAGNOSIS — N200 Calculus of kidney: Secondary | ICD-10-CM

## 2014-12-18 LAB — BASIC METABOLIC PANEL
Anion gap: 10 (ref 5–15)
BUN: 12 mg/dL (ref 6–20)
CHLORIDE: 101 mmol/L (ref 101–111)
CO2: 24 mmol/L (ref 22–32)
Calcium: 9.3 mg/dL (ref 8.9–10.3)
Creatinine, Ser: 0.87 mg/dL (ref 0.61–1.24)
GFR calc non Af Amer: >60 mL/min (ref >60)
GLUCOSE: 152 mg/dL — AB (ref 70–99)
Potassium: 3.7 mmol/L (ref 3.5–5.1)
Sodium: 135 mmol/L (ref 135–145)

## 2014-12-18 LAB — GLUCOSE, CAPILLARY
Glucose-Capillary: 81 mg/dL (ref 70–99)
Glucose-Capillary: 159 mg/dL — ABNORMAL HIGH (ref 70–99)
Glucose-Capillary: 109 mg/dL — ABNORMAL HIGH (ref 70–99)

## 2014-12-18 LAB — CBC WITH DIFFERENTIAL/PLATELET
BASOS ABS: 0.1 10*3/uL (ref 0.0–0.1)
BASOS PCT: 1 % (ref 0–1)
EOS ABS: 0.5 10*3/uL (ref 0.0–0.7)
EOS PCT: 6 % — AB (ref 0–5)
HEMATOCRIT: 43.3 % (ref 39.0–52.0)
HEMOGLOBIN: 14.8 g/dL (ref 13.0–17.0)
Lymphocytes Relative: 35 % (ref 12–46)
Lymphs Abs: 2.8 10*3/uL (ref 0.7–4.0)
MCH: 30.1 pg (ref 26.0–34.0)
MCHC: 34.2 g/dL (ref 30.0–36.0)
MCV: 88.2 fL (ref 78.0–100.0)
MONO ABS: 0.7 10*3/uL (ref 0.1–1.0)
MONOS PCT: 9 % (ref 3–12)
Neutro Abs: 4 10*3/uL (ref 1.7–7.7)
Neutrophils Relative %: 49 % (ref 43–77)
Platelets: 208 10*3/uL (ref 150–400)
RBC: 4.91 MIL/uL (ref 4.22–5.81)
RDW: 12.9 % (ref 11.5–15.5)
WBC: 8.1 10*3/uL (ref 4.0–10.5)

## 2014-12-18 LAB — TROPONIN I
Troponin I: <0.03 ng/mL (ref <0.031)
Troponin I: <0.03 ng/mL (ref <0.031)
Troponin I: <0.03 ng/mL (ref <0.031)
Troponin I: <0.03 ng/mL (ref <0.031)

## 2014-12-18 LAB — CBG MONITORING, ED: Glucose-Capillary: 141 mg/dL — ABNORMAL HIGH (ref 70–99)

## 2014-12-18 LAB — TSH: TSH: 2.917 u[IU]/mL (ref 0.350–4.500)

## 2014-12-18 MED ORDER — SODIUM CHLORIDE 0.9 % IV BOLUS (SEPSIS)
1000.0000 mL | Freq: Once | INTRAVENOUS | Status: AC
  Administered 2014-12-18: 1000 mL via INTRAVENOUS

## 2014-12-18 MED ORDER — ONDANSETRON HCL 4 MG/2ML IJ SOLN: 4.0000 mg | Freq: Four times a day (QID) | INTRAMUSCULAR | Status: DC | PRN

## 2014-12-18 MED ORDER — MECLIZINE HCL 25 MG PO TABS
25.0000 mg | ORAL_TABLET | Freq: Once | ORAL | Status: AC
  Administered 2014-12-18: 25 mg via ORAL
  Filled 2014-12-18: qty 1

## 2014-12-18 MED ORDER — ASPIRIN 81 MG PO CHEW
324.0000 mg | CHEWABLE_TABLET | Freq: Once | ORAL | Status: AC
  Administered 2014-12-18: 324 mg via ORAL
  Filled 2014-12-18: qty 4

## 2014-12-18 MED ORDER — SODIUM CHLORIDE 0.9 % IV SOLN
INTRAVENOUS | Status: AC
  Administered 2014-12-18: 1000 mL via INTRAVENOUS

## 2014-12-18 MED ORDER — ENOXAPARIN SODIUM 40 MG/0.4ML ~~LOC~~ SOLN
40.0000 mg | SUBCUTANEOUS | Status: DC
  Administered 2014-12-18: 40 mg via SUBCUTANEOUS
  Filled 2014-12-18: qty 0.4

## 2014-12-18 MED ORDER — FAMOTIDINE 20 MG PO TABS
20.0000 mg | ORAL_TABLET | Freq: Every day | ORAL | Status: DC
  Administered 2014-12-18: 20 mg via ORAL
  Filled 2014-12-18: qty 1

## 2014-12-18 MED ORDER — LISINOPRIL 10 MG PO TABS
10.0000 mg | ORAL_TABLET | Freq: Two times a day (BID) | ORAL | Status: DC
  Administered 2014-12-19: 10 mg via ORAL
  Filled 2014-12-18 (×2): qty 1

## 2014-12-18 MED ORDER — INSULIN ASPART 100 UNIT/ML ~~LOC~~ SOLN
0.0000 [IU] | Freq: Three times a day (TID) | SUBCUTANEOUS | Status: DC
  Administered 2014-12-18: 3 [IU] via SUBCUTANEOUS

## 2014-12-18 MED ORDER — LOPERAMIDE HCL 2 MG PO CAPS: 2.0000 mg | ORAL_CAPSULE | Freq: Two times a day (BID) | ORAL | Status: DC | PRN

## 2014-12-18 MED ORDER — GABAPENTIN 300 MG PO CAPS
300.0000 mg | ORAL_CAPSULE | Freq: Every day | ORAL | Status: DC
  Administered 2014-12-18: 300 mg via ORAL
  Filled 2014-12-18: qty 1

## 2014-12-18 MED ORDER — HYDROCODONE-ACETAMINOPHEN 5-325 MG PO TABS
1.0000 | ORAL_TABLET | Freq: Once | ORAL | Status: AC
  Administered 2014-12-18: 1 via ORAL
  Filled 2014-12-18: qty 1

## 2014-12-18 MED ORDER — ACETAMINOPHEN 325 MG PO TABS
650.0000 mg | ORAL_TABLET | ORAL | Status: DC | PRN
  Administered 2014-12-18: 650 mg via ORAL
  Filled 2014-12-18: qty 2

## 2014-12-18 MED ORDER — ASPIRIN EC 81 MG PO TBEC
81.0000 mg | DELAYED_RELEASE_TABLET | Freq: Every day | ORAL | Status: DC
  Administered 2014-12-19: 81 mg via ORAL
  Filled 2014-12-18: qty 1

## 2014-12-18 MED ORDER — INSULIN GLARGINE 100 UNIT/ML ~~LOC~~ SOLN
10.0000 [IU] | Freq: Every day | SUBCUTANEOUS | Status: DC
  Administered 2014-12-19: 10 [IU] via SUBCUTANEOUS
  Filled 2014-12-18: qty 0.1

## 2014-12-18 MED ORDER — ATORVASTATIN CALCIUM 40 MG PO TABS
40.0000 mg | ORAL_TABLET | Freq: Every day | ORAL | Status: DC
  Administered 2014-12-19: 40 mg via ORAL
  Filled 2014-12-18: qty 1

## 2014-12-18 MED ORDER — MORPHINE SULFATE 2 MG/ML IJ SOLN
1.0000 mg | INTRAMUSCULAR | Status: DC | PRN
  Administered 2014-12-19: 1 mg via INTRAVENOUS
  Filled 2014-12-18: qty 1

## 2014-12-18 NOTE — ED Notes (Signed)
Pt was at work when he became dizzy and diaphoretic.  Pt reports having chest pain for the past three days intermittently and feeling dizzy and "just not feeling well" this morning.  Pt remains pale and diaphoretic at this time.  Pt is scheduled for a stress test next week.

## 2014-12-18 NOTE — H&P (Signed)
[facility] History and Physical  Albert [name] CHE:[id number] DOB: August 09, 1956 DOA: 12/18/2014   PCP: [name], MD  Specialists: Recently seen by Dr. Stanford Breed and a stress test was planned for next week  Chief Complaint: Dizziness and chest pain  HPI: Albert [name] is a 59 y.o. male with a past medical history of diabetes on insulin and oral agents, hypertension, hypercholesterolemia, who works as a Presenter, broadcasting at [employer] who presented to the Emergency Department after experiencing lightheadedness, dizziness and chest pain earlier today while he was working. Patient tells me that his chest pain has been ongoing since March. It has been on and off, irrespective of activity level. It is usually present in the left side of the chest radiating to the left shoulder and arm. Describes as a sharp pain and mostly about 8-10 out of 10 in intensity. It last usually about 20-30 minutes. Associated with some dizziness and dry cough. No shortness of breath usually. No fever, no chills. No nausea, vomiting. He saw his primary care physician and was prescribed nitroglycerin and an appointment was made with cardiology. He saw the cardiologist and a stress test was scheduled for next week. Today he was at work and experienced lightheadedness and chest pain. He took a nitroglycerin. The pain resolved but he continued to feel lightheaded and so decided to seek attention. He denies any palpitations or leg swelling currently. Denies any chest pain currently. He also mentions that he ate at [employer] last night and has had diarrhea since 2:00 this morning. Last episode was at 4:30. Has some chronic abdominal pain issues. He reports being up-to-date on colonoscopy.  Home Medications: Prior to Admission medications   Medication Sig Start Date End Date Taking? Authorizing Provider  aspirin EC 81 MG tablet Take 1 tablet (81 mg total) by mouth daily. 10/26/14  Yes Mosie Lukes, MD  atorvastatin (LIPITOR) 40 MG  tablet Take 1 tablet (40 mg total) by mouth daily. 05/12/14  Yes Mosie Lukes, MD  Blood Glucose Monitoring Suppl (ONE TOUCH ULTRA SYSTEM KIT) W/DEVICE KIT Use as directed to check blood sugar.  Diagnosis code E11.40   Yes Historical Provider, MD  gabapentin (NEURONTIN) 300 MG capsule Take 1 capsule (300 mg total) by mouth at bedtime. 10/26/14  Yes Mosie Lukes, MD  glucose blood test strip Use as directed twice daily to check blood sugar.  Diagnosis code E11.40   Yes Historical Provider, MD  Insulin Glargine (LANTUS) 100 UNIT/ML Solostar Pen Inject 10 Units into the skin daily. Patient taking differently: Inject 12 Units into the skin daily.  10/30/14  Yes Mosie Lukes, MD  lisinopril (PRINIVIL,ZESTRIL) 10 MG tablet TAKE 1 TABLET BY MOUTH 2 TIMES DAILY. 11/03/14  Yes Mosie Lukes, MD  loperamide (IMODIUM A-D) 2 MG tablet Take 2 mg by mouth 4 (four) times daily as needed for diarrhea or loose stools.   Yes Historical Provider, MD  Multiple Vitamins-Minerals (MULTIVITAMIN WITH MINERALS) tablet Take 1 tablet by mouth daily.   Yes Historical Provider, MD  nitroGLYCERIN (NITROSTAT) 0.4 MG SL tablet Place 1 tablet (0.4 mg total) under the tongue every 5 (five) minutes as needed for chest pain. X 3 doses 10/26/14  Yes Mosie Lukes, MD  ranitidine (ZANTAC) 300 MG tablet Take 1 tablet (300 mg total) by mouth at bedtime. 10/26/14  Yes Mosie Lukes, MD  sitaGLIPtin-metformin (JANUMET) 50-500 MG per tablet Take 1 tablet by mouth 2 (two) times daily with a meal.  05/21/14  Yes Mosie Lukes, MD    Allergies:  Allergies  Allergen Reactions  . Contrast Media [Iodinated Diagnostic Agents] Swelling    Can take if premedicated with diphenhydramine  . Iodine Swelling    Past Medical History: Past Medical History  Diagnosis Date  . Hypertension   . History of chicken pox     childhood  . Seasonal allergies   . History of kidney stones   . Squamous cell carcinoma in situ 2014    forehead, s/p  excision by derm  . Sciatica of right side 12/22/2012  . Insomnia 02/25/2013  . Diverticulosis 10/26/2013  . Colon polyps     adenomatous  . DM (diabetes mellitus), type 2 with neurological complications 10/27/1759  . Hyperlipidemia 07/26/2014  . 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 Preventative health care 07/26/2014    Past Surgical History  Procedure Laterality Date  . Hernia repair    . Abdominal hernia repair    . Rotator cuff repair    . Cholecystectomy    . Mass excision  05/22/2012    Procedure: EXCISION MASS;  Surgeon: Joyice Faster. Cornett, MD;  Location: [place];  Service: General;  Laterality: Left;  excision abdominal wall mass  . Laparoscopic appendectomy N/A 08/16/2014    Procedure: APPENDECTOMY LAPAROSCOPIC;  Surgeon: Erroll Luna, MD;  Location: [place];  Service: General;  Laterality: N/A;    Social History: Patient lives in [place] with his spouse. No smoking, alcohol use or illicit drug use. Independent with daily activities.  Family History:  Family History  Problem Relation Age of Onset  . Lung cancer Mother   . Breast cancer Neg Hx   . Colon cancer Neg Hx   . Prostate cancer Neg Hx   . Heart disease Neg Hx   . Diabetes Maternal Aunt   . Cancer Paternal Grandmother      Review of Systems - History obtained from the patient General ROS: positive for  - fatigue Psychological ROS: negative Ophthalmic ROS: negative ENT ROS: negative Allergy and Immunology ROS: negative Hematological and Lymphatic ROS: negative Endocrine ROS: negative Respiratory ROS: as in hpi Cardiovascular ROS: as in hpi Gastrointestinal ROS: as in hpi Genito-Urinary ROS: no dysuria, trouble voiding, or hematuria Musculoskeletal ROS: negative Neurological ROS: no TIA or stroke symptoms Dermatological ROS: negative  Physical Examination  Filed Vitals:   12/18/14 1100 12/18/14 1130 12/18/14 1145 12/18/14 1200  BP: 115/73 111/67 112/72  116/78  Pulse: 73 75 79 76  Temp:      TempSrc:      Resp: 12 10 24 16   Height:      Weight:      SpO2: 99% 100% 100% 100%    BP 116/78 mmHg  Pulse 76  Temp(Src) 98 F (36.7 C) (Oral)  Resp 16  Ht 6' (1.829 m)  Wt 87.998 kg (194 lb)  BMI 26.31 kg/m2  SpO2 100%  General appearance: alert, cooperative, appears stated age and no distress Head: Normocephalic, without obvious abnormality, atraumatic Eyes: conjunctivae/corneas clear. PERRL, EOM's intact.  Throat: lips, mucosa, and tongue normal; teeth and gums normal Neck: no adenopathy, no carotid bruit, no JVD, supple, symmetrical, trachea midline and thyroid not enlarged, symmetric, no tenderness/mass/nodules Resp: clear to auscultation bilaterally Cardio: regular rate and rhythm, S1, S2 normal, no murmur, click, rub or gallop GI: soft, non-tender; bowel sounds normal; no masses,  no organomegaly Extremities: extremities normal, atraumatic, no cyanosis or edema Pulses: 2+ and  symmetric Skin: Skin color, texture, turgor normal. No rashes or lesions Lymph nodes: Cervical, supraclavicular, and axillary nodes normal. Neurologic: No focal deficits.  Laboratory Data: Results for orders placed or performed during the hospital encounter of 12/18/14 (from the past 48 hour(s))  CBG monitoring, ED     Status: Abnormal   Collection Time: 12/18/14  9:43 AM  Result Value Ref Range   Glucose-Capillary 141 (H) 70 - 99 mg/dL  Basic metabolic panel     Status: Abnormal   Collection Time: 12/18/14  9:50 AM  Result Value Ref Range   Sodium 135 135 - 145 mmol/L   Potassium 3.7 3.5 - 5.1 mmol/L   Chloride 101 101 - 111 mmol/L   CO2 24 22 - 32 mmol/L   Glucose, Bld 152 (H) 70 - 99 mg/dL   BUN 12 6 - 20 mg/dL   Creatinine, Ser 0.87 0.61 - 1.24 mg/dL   Calcium 9.3 8.9 - 10.3 mg/dL   GFR calc non Af Amer >60 >60 mL/min   GFR calc Af Amer >60 >60 mL/min    Comment: (NOTE) The eGFR has been calculated using the CKD EPI equation. This  calculation has not been validated in all clinical situations. eGFR's persistently <60 mL/min signify possible Chronic Kidney Disease.    Anion gap 10 5 - 15  CBC with Differential     Status: Abnormal   Collection Time: 12/18/14  9:50 AM  Result Value Ref Range   WBC 8.1 4.0 - 10.5 K/uL   RBC 4.91 4.22 - 5.81 MIL/uL   Hemoglobin 14.8 13.0 - 17.0 g/dL   HCT 43.3 39.0 - 52.0 %   MCV 88.2 78.0 - 100.0 fL   MCH 30.1 26.0 - 34.0 pg   MCHC 34.2 30.0 - 36.0 g/dL   RDW 12.9 11.5 - 15.5 %   Platelets 208 150 - 400 K/uL   Neutrophils Relative % 49 43 - 77 %   Neutro Abs 4.0 1.7 - 7.7 K/uL   Lymphocytes Relative 35 12 - 46 %   Lymphs Abs 2.8 0.7 - 4.0 K/uL   Monocytes Relative 9 3 - 12 %   Monocytes Absolute 0.7 0.1 - 1.0 K/uL   Eosinophils Relative 6 (H) 0 - 5 %   Eosinophils Absolute 0.5 0.0 - 0.7 K/uL   Basophils Relative 1 0 - 1 %   Basophils Absolute 0.1 0.0 - 0.1 K/uL  Troponin I     Status: None   Collection Time: 12/18/14  9:50 AM  Result Value Ref Range   Troponin I <0.03 <0.031 ng/mL    Comment:        NO INDICATION OF MYOCARDIAL INJURY.     Radiology Reports: Dg Chest 2 View  12/18/2014   CLINICAL DATA:  Dizziness and diaphoresis  EXAM: CHEST  2 VIEW  COMPARISON:  August 16, 2014  FINDINGS: Lungs are clear. Heart size and pulmonary vascularity are normal. No adenopathy. There is postoperative change in the right shoulder region.  IMPRESSION: No edema or consolidation.   Electronically Signed   By: Lowella Grip III M.D.   On: 12/18/2014 10:17    Electrocardiogram: Sinus rhythm at 84 bpm. Normal axis. Right bundle branch block is noted. No Q waves. No concerning ST or T-wave changes.  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 H has been seen by cardiology recently for similar symptoms and an outpatient stress test was planned. He has risk factors for coronary artery disease including diabetes, hypertension and hypercholesterolemia. However, his symptoms are somewhat atypical. Denies any history of heartburns.  Plan: #1 chest pain with lightheadedness/Orthostatic Hypotension: Orthostatics were checked in the ED and he was noted to be orthostatic. His systolic blood pressure went from 1:15 to 96. His heart rate went from 83-97. Nitroglycerin could've contributed. EKG does not show any acute ST changes. However, since stress test was planned we will go ahead and request cardiology consultation to consider testing as an inpatient as well. For his orthostatics he has been given IV fluids. We will repeat orthostatics tomorrow morning.  #2 Diabetes mellitus type 2: Continue with his Lantus insulin coverage. Hold his oral agents for now. Sliding scale coverage will be initiated. Check HbA1c.  #3 Essential hypertension: Continue with home medications.  #4 history of hypercholesterolemia: Continue with home medications.   #5 Acute diarrhea: Most likely secondary to something he ate at [employer]. Hasn't had any further episodes since 4:30 this morning. Imodium as needed. Check TSH  DVT Prophylaxis: Lovenox Code Status: Full code Family Communication: Discussed with the patient and his wife  Disposition Plan: Observe to telemetry   Further management decisions will depend on results of further testing and patient's response to treatment.   [facility]  [facility] Pager [phone]  If 7PM-7AM, please contact night-coverage [email or web address] Password [email or web address]  12/18/2014, 12:30 PM

## 2014-12-18 NOTE — Plan of Care (Signed)
Problem: Phase I Progression Outcomes Goal: Anginal pain relieved Outcome: Progressing Patient has had no complaints of angina throughout shift. Patient complains of pain 10/10 in right hand. PRN medications given. Will continue to monitor.

## 2014-12-18 NOTE — ED Provider Notes (Signed)
CSN: [id number]     Arrival date & time 12/18/14  0941 History   First MD Initiated Contact with Patient 12/18/14 [phone]     Chief Complaint  Patient presents with  . 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 Pt states that he has been having intermittent cp over the last couple of days. Pain is not associated with exertion. Pain is sharp and substernal in nature and it radiates down the left arm. Was having pain this morning but is not having any at this time. He did take nitro this morning. He was seen by cardiology he is schedule for a stress test next week.dizziness this morning was feeling near syncopal and now he feels like the room is spinning. Denies sob, vomiting, fever, cough. Has had some diarrhea.   The history is provided by the patient. No language interpreter was used.    Past Medical History  Diagnosis Date  . Hypertension   . History of chicken pox     childhood  . Seasonal allergies   . History of kidney stones   . 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 Sciatica of right side 12/22/2012  . Insomnia 02/25/2013  . Diverticulosis 10/26/2013  . Colon polyps     adenomatous  . DM (diabetes mellitus), type 2 with neurological complications 04/30/9149  . Hyperlipidemia 07/26/2014  . 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 Preventative health care 07/26/2014   Past Surgical History  Procedure Laterality Date  . Hernia repair    . Abdominal hernia repair    . Rotator cuff repair    . Cholecystectomy    . Mass excision  05/22/2012    Procedure: EXCISION MASS;  Surgeon: Joyice Faster. Cornett, MD;  Location: [facility];  Service: General;  Laterality: Left;  excision abdominal wall mass  . Laparoscopic appendectomy N/A 08/16/2014     Procedure: APPENDECTOMY LAPAROSCOPIC;  Surgeon: Erroll Luna, MD;  Location: [facility];  Service: General;  Laterality: N/A;   Family History  Problem Relation Age of Onset  . Lung cancer Mother   . Breast cancer Neg Hx   . Colon cancer Neg Hx   . Prostate cancer Neg Hx   . Heart disease Neg Hx   . Diabetes Maternal Aunt   . Cancer Paternal Grandmother    History  Substance Use Topics  . Smoking status: Never Smoker   . Smokeless tobacco: Never Used  . Alcohol Use: No    Review of Systems  All other systems reviewed and are negative.     Allergies  Contrast media and Iodine  Home Medications   Prior to Admission medications   Medication Sig Start Date End Date Taking? Authorizing Provider  aspirin EC 81 MG tablet Take 1 tablet (81 mg total) by mouth daily. 10/26/14   Mosie Lukes, MD  atorvastatin (LIPITOR) 40 MG tablet Take 1 tablet (40 mg total) by mouth daily. 05/12/14   Mosie Lukes, MD  Blood Glucose Monitoring Suppl (ONE TOUCH ULTRA SYSTEM KIT) W/DEVICE KIT Use as directed to check blood sugar.  Diagnosis code E11.40    Historical Provider, MD  gabapentin (NEURONTIN) 300 MG capsule Take 1 capsule (300 mg total) by mouth at bedtime. 10/26/14   Mosie Lukes, MD  glucose blood test strip  Use as directed twice daily to check blood sugar.  Diagnosis code E11.40    Historical Provider, MD  Insulin Glargine (LANTUS) 100 UNIT/ML Solostar Pen Inject 10 Units into the skin daily. Patient taking differently: Inject 12 Units into the skin daily.  10/30/14   Mosie Lukes, MD  lisinopril (PRINIVIL,ZESTRIL) 10 MG tablet TAKE 1 TABLET BY MOUTH 2 TIMES DAILY. 11/03/14   Mosie Lukes, MD  loperamide (IMODIUM A-D) 2 MG tablet Take 2 mg by mouth 4 (four) times daily as needed for diarrhea or loose stools.    Historical Provider, MD  nitroGLYCERIN (NITROSTAT) 0.4 MG SL tablet Place 1 tablet (0.4 mg total) under the tongue every 5 (five) minutes as needed for chest pain. X 3 doses  10/26/14   Mosie Lukes, MD  ranitidine (ZANTAC) 300 MG tablet Take 1 tablet (300 mg total) by mouth at bedtime. 10/26/14   Mosie Lukes, MD  sitaGLIPtin-metformin (JANUMET) 50-500 MG per tablet Take 1 tablet by mouth 2 (two) times daily with a meal. 05/21/14   Mosie Lukes, MD   BP 111/69 mmHg  Pulse 88  Temp(Src) 98 F (36.7 C) (Oral)  Resp 17  Ht 6' (1.829 m)  Wt 194 lb (87.998 kg)  BMI 26.31 kg/m2  SpO2 100% Physical Exam  Constitutional: He is oriented to person, place, and time. He appears well-developed and well-nourished.  Eyes: Conjunctivae and EOM are normal. Pupils are equal, round, and reactive to light.  Neck: Normal range of motion. Neck supple.  Cardiovascular: Normal rate and regular rhythm.   Pulmonary/Chest: Effort normal and breath sounds normal. He exhibits no tenderness.  Abdominal: Soft. Bowel sounds are normal. There is no tenderness.  Musculoskeletal: Normal range of motion.  Neurological: He is alert and oriented to person, place, and time.  Skin: Skin is warm and dry.  Psychiatric: He has a normal mood and affect.  Nursing note and vitals reviewed.   ED Course  Procedures (including critical care time) Labs Review Labs Reviewed  CBC WITH DIFFERENTIAL/PLATELET - Abnormal; Notable for the following:    Eosinophils Relative 6 (*)    All other components within normal limits  CBG MONITORING, ED - Abnormal; Notable for the following:    Glucose-Capillary 141 (*)    All other components within normal limits  BASIC METABOLIC PANEL  TROPONIN I    Imaging Review Dg Chest 2 View  12/18/2014   CLINICAL DATA:  Dizziness and diaphoresis  EXAM: CHEST  2 VIEW  COMPARISON:  August 16, 2014  FINDINGS: Lungs are clear. Heart size and pulmonary vascularity are normal. No adenopathy. There is postoperative change in the right shoulder region.  IMPRESSION: No edema or consolidation.   Electronically Signed   By: Lowella Grip III M.D.   On: 12/18/2014 10:17      EKG Interpretation   Date/Time:  Friday Dec 18 2014 09:46:42 EDT Ventricular Rate:  84 PR Interval:  173 QRS Duration: 142 QT Interval:  390 QTC Calculation: 461 R Axis:   86 Text Interpretation:  Sinus rhythm Right bundle branch block No  significant change since last tracing Confirmed by Kathrynn Humble, MD, Thelma Comp  [phone]) on 12/18/2014 10:29:06 AM      MDM   Final diagnoses:  Chest pain  Near syncope    Spoke with cardiology and they will consult. Pt is to be admitted for cp rule. Pt has multiple risk factors including diabetes, high cholesterol and hypertension    Glendell Docker,  NP 12/18/14 [place], MD 12/18/14 1639

## 2014-12-18 NOTE — Consult Note (Signed)
[name] MRN: [id number], DOB/AGE: 1955/12/14   Admit date: 12/18/2014   Primary Physician: [name], MD Primary Cardiologist: Dr. Stanford Breed  Pt. Profile:  59 y/o male with multiple cardiac risk factors including HTN, HLD and IDDM but no known CAD admitted for chest pain.   Problem List  Past Medical History  Diagnosis Date  . Hypertension   . History of chicken pox     childhood  . Seasonal allergies   . History of kidney stones   . 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 Sciatica of right side 12/22/2012  . Insomnia 02/25/2013  . Diverticulosis 10/26/2013  . Colon polyps     adenomatous  . DM (diabetes mellitus), type 2 with neurological complications 03/14/6552  . Hyperlipidemia 07/26/2014  . 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 Preventative health care 07/26/2014    Past Surgical History  Procedure Laterality Date  . Hernia repair    . Abdominal hernia repair    . Rotator cuff repair    . Cholecystectomy    . Mass excision  05/22/2012    Procedure: EXCISION MASS;  Surgeon: Joyice Faster. Cornett, MD;  Location: [facility];  Service: General;  Laterality: Left;  excision abdominal wall mass  . Laparoscopic appendectomy N/A 08/16/2014    Procedure: APPENDECTOMY LAPAROSCOPIC;  Surgeon: Erroll Luna, MD;  Location: [facility] OR;  Service: General;  Laterality: N/A;     Allergies  Allergies  Allergen Reactions  . Contrast Media [Iodinated Diagnostic Agents] Swelling    Can take if premedicated with diphenhydramine  . Iodine Swelling    HPI  The patient is a 59 y/o male, followed by Dr. Stanford Breed, with multiple cardiac risk factors but no known CAD being admitted for chest pain. 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 His PMH is significant for HTN, HLD and DM. He is followed medically by Dr. Charlett Blake. He is on insulin for his DM. He reportedly had a cardiac catheterization in  2005 in [place] for chest pain with no coronary disease. No records available. Exercise treadmill May 2014 showed no ST changes.   He was recently seen by Dr. Stanford Breed in clinic on 12/02/14 for chest pain evaluation. Per office note, the patient endorsed several episodes of chest pain. These episodes occurred in the left chest and radiated to the left upper extremity. They were described as a sharp pain. Not exertional, pleuritic, positional or related to food. Resolved after 10 minutes. No associated symptoms. Otherwise no dyspnea on exertion, orthopnea, PND, pedal edema, exertional chest pain or syncope. Given his risk factors including a 20 + year h/o DM, decision was made to order and exercise stress test to risk stratify. This has not yet been completed. Scheduled for 5/13.  Since 5/9, he has developed recurrent left sided chest pain radiating to the arm. Sharp in nature. Can occur at rest. Not worse with exertion. Episodes have occurred intermittently over the last few days. Longest episode this week lasted 45 min. Earlier today he had recurrent pain with associated nausea and diaphoresis. No vomiting, dyspnea, syncope/near syncope. He took SL NTG x 1 and pain resolved after 10 minutes. Episode prompted him to report to ER. Now CP free.  In ER, vital signs are stable. Initial troponin is negative. EKG shows NSR with chronic RBBB. CBC and BMP unremarkable. CXR shows no acute abnormalities. No edema or consolidation. Internal  Medicine has admitted to telemetry.    Home Medications  Prior to Admission medications   Medication Sig Start Date End Date Taking? Authorizing Provider  aspirin EC 81 MG tablet Take 1 tablet (81 mg total) by mouth daily. 10/26/14  Yes Mosie Lukes, MD  atorvastatin (LIPITOR) 40 MG tablet Take 1 tablet (40 mg total) by mouth daily. 05/12/14  Yes Mosie Lukes, MD  Blood Glucose Monitoring Suppl (ONE TOUCH ULTRA SYSTEM KIT) W/DEVICE KIT Use as directed to check blood sugar.   Diagnosis code E11.40   Yes Historical Provider, MD  gabapentin (NEURONTIN) 300 MG capsule Take 1 capsule (300 mg total) by mouth at bedtime. 10/26/14  Yes Mosie Lukes, MD  glucose blood test strip Use as directed twice daily to check blood sugar.  Diagnosis code E11.40   Yes Historical Provider, MD  Insulin Glargine (LANTUS) 100 UNIT/ML Solostar Pen Inject 10 Units into the skin daily. Patient taking differently: Inject 12 Units into the skin daily.  10/30/14  Yes Mosie Lukes, MD  lisinopril (PRINIVIL,ZESTRIL) 10 MG tablet TAKE 1 TABLET BY MOUTH 2 TIMES DAILY. 11/03/14  Yes Mosie Lukes, MD  loperamide (IMODIUM A-D) 2 MG tablet Take 2 mg by mouth 4 (four) times daily as needed for diarrhea or loose stools.   Yes Historical Provider, MD  Multiple Vitamins-Minerals (MULTIVITAMIN WITH MINERALS) tablet Take 1 tablet by mouth daily.   Yes Historical Provider, MD  nitroGLYCERIN (NITROSTAT) 0.4 MG SL tablet Place 1 tablet (0.4 mg total) under the tongue every 5 (five) minutes as needed for chest pain. X 3 doses 10/26/14  Yes Mosie Lukes, MD  ranitidine (ZANTAC) 300 MG tablet Take 1 tablet (300 mg total) by mouth at bedtime. 10/26/14  Yes Mosie Lukes, MD  sitaGLIPtin-metformin (JANUMET) 50-500 MG per tablet Take 1 tablet by mouth 2 (two) times daily with a meal. 05/21/14  Yes Mosie Lukes, MD    Family History  Family History  Problem Relation Age of Onset  . Lung cancer Mother   . Breast cancer Neg Hx   . Colon cancer Neg Hx   . Prostate cancer Neg Hx   . Heart disease Neg Hx   . Diabetes Maternal Aunt   . Cancer Paternal Grandmother     Social History  History   Social History  . Marital Status: Married    Spouse Name: N/A  . Number of Children: 3  . Years of Education: N/A   Occupational History  .      Security officer [employer]   Social History Main Topics  . Smoking status: Never Smoker   . Smokeless tobacco: Never Used  . Alcohol Use: No  . Drug Use: No  . Sexual  Activity: Not on file   Other Topics Concern  . Not on file   Social History Narrative     Review of Systems General:  No chills, fever, night sweats or weight changes.  Cardiovascular:  No chest pain, dyspnea on exertion, edema, orthopnea, palpitations, paroxysmal nocturnal dyspnea. Dermatological: No rash, lesions/masses Respiratory: No cough, dyspnea Urologic: No hematuria, dysuria Abdominal:   No nausea, vomiting, diarrhea, bright red blood per rectum, melena, or hematemesis Neurologic:  No visual changes, wkns, changes in mental status. All other systems reviewed and are otherwise negative except as noted above.  Physical Exam  Blood pressure 116/78, pulse 76, temperature 98 F (36.7 C), temperature source Oral, resp. rate 16, height 6' (1.829 m), weight 194  lb (87.998 kg), SpO2 100 %.  General: Pleasant, NAD Psych: Normal affect. Neuro: Alert and oriented X 3. Moves all extremities spontaneously. HEENT: Normal  Neck: Supple without bruits or JVD. Lungs:  Resp regular and unlabored, CTA. Heart: RRR no s3, s4, or murmurs. Abdomen: Soft, non-tender, non-distended, BS + x 4.  Extremities: No clubbing, cyanosis or edema. DP/PT/Radials 2+ and equal bilaterally.  Labs  Troponin (Point of Care Test) No results for input(s): TROPIPOC in the last 72 hours.  Recent Labs  12/18/14 0950  TROPONINI <0.03   Lab Results  Component Value Date   WBC 8.1 12/18/2014   HGB 14.8 12/18/2014   HCT 43.3 12/18/2014   MCV 88.2 12/18/2014   PLT 208 12/18/2014    Recent Labs Lab 12/18/14 0950  NA 135  K 3.7  CL 101  CO2 24  BUN 12  CREATININE 0.87  CALCIUM 9.3  GLUCOSE 152*   Lab Results  Component Value Date   CHOL 177 10/26/2014   HDL 34.30* 10/26/2014   LDLCALC 63 03/30/2014   TRIG 229.0* 10/26/2014   No results found for: DDIMER   Radiology/Studies  Dg Chest 2 View  12/18/2014   CLINICAL DATA:  Dizziness and diaphoresis  EXAM: CHEST  2 VIEW  COMPARISON:  August 16, 2014  FINDINGS: Lungs are clear. Heart size and pulmonary vascularity are normal. No adenopathy. There is postoperative change in the right shoulder region.  IMPRESSION: No edema or consolidation.   Electronically Signed   By: Lowella Grip III M.D.   On: 12/18/2014 10:17    ECG  NSR with chronic RBBB    ASSESSMENT AND PLAN  Principal Problem:   Chest pain Active Problems:   DM (diabetes mellitus), type 2 with neurological complications   HTN (hypertension)   Diarrhea   Hyperlipidemia   1. Chest Pain: atypical symptoms but with multiple cardiac risk factors including IDDM, HTN and HLD. Was scheduled for outpatient exercise stress test for 5/13. Recommend cycling CE x 3 and completing exercise Myoview tomorrow to r/o ischemia. NPO at midnight. Continue risk factor modification by control of his DM, HTN and HLD. Continue ASA, statin and ACE-I.    Signed, Lyda Jester, PA-C 12/18/2014, 1:07 PM  Patient examned chart reviewed.  Normal exam.  Troponin negative no acute ECG changes.  Pain a bit atypical with left hand pain and non exertional components. Agree with inpatient myovue in am.  Had been scheduled for 5/13 as outpatient by Dr Stanford Breed.  Discussed with patient and wife and they agree with plan  Jenkins Rouge

## 2014-12-18 NOTE — ED Notes (Signed)
Admitting MD at bedside.

## 2014-12-19 ENCOUNTER — Observation Stay (HOSPITAL_COMMUNITY): Payer: 59

## 2014-12-19 DIAGNOSIS — E1140 Type 2 diabetes mellitus with diabetic neuropathy, unspecified: Secondary | ICD-10-CM | POA: Diagnosis not present

## 2014-12-19 DIAGNOSIS — E785 Hyperlipidemia, unspecified: Secondary | ICD-10-CM

## 2014-12-19 DIAGNOSIS — I10 Essential (primary) hypertension: Secondary | ICD-10-CM | POA: Diagnosis not present

## 2014-12-19 DIAGNOSIS — R079 Chest pain, unspecified: Secondary | ICD-10-CM | POA: Diagnosis not present

## 2014-12-19 LAB — GLUCOSE, CAPILLARY
Glucose-Capillary: 148 mg/dL — ABNORMAL HIGH (ref 70–99)
Glucose-Capillary: 135 mg/dL — ABNORMAL HIGH (ref 70–99)

## 2014-12-19 LAB — URINALYSIS, ROUTINE W REFLEX MICROSCOPIC
Bilirubin Urine: NEGATIVE
Glucose, UA: NEGATIVE mg/dL
Hgb urine dipstick: NEGATIVE
Ketones, ur: NEGATIVE mg/dL
Leukocytes, UA: NEGATIVE
Nitrite: NEGATIVE
Protein, ur: NEGATIVE mg/dL
SPECIFIC GRAVITY, URINE: 1.007 (ref 1.005–1.030)
Urobilinogen, UA: 1 mg/dL (ref 0.0–1.0)
pH: 6 (ref 5.0–8.0)

## 2014-12-19 LAB — HEMOGLOBIN A1C
HEMOGLOBIN A1C: 8.4 % — AB (ref 4.8–5.6)
Mean Plasma Glucose: 194 mg/dL

## 2014-12-19 LAB — BASIC METABOLIC PANEL
Anion gap: 8 (ref 5–15)
BUN: 10 mg/dL (ref 6–20)
CHLORIDE: 104 mmol/L (ref 101–111)
CO2: 23 mmol/L (ref 22–32)
Calcium: 8.9 mg/dL (ref 8.9–10.3)
Creatinine, Ser: 0.81 mg/dL (ref 0.61–1.24)
GFR calc non Af Amer: >60 mL/min (ref >60)
Glucose, Bld: 138 mg/dL — ABNORMAL HIGH (ref 70–99)
POTASSIUM: 3.8 mmol/L (ref 3.5–5.1)
Sodium: 135 mmol/L (ref 135–145)

## 2014-12-19 LAB — CBC
HEMATOCRIT: 42.6 % (ref 39.0–52.0)
Hemoglobin: 14.6 g/dL (ref 13.0–17.0)
MCH: 30 pg (ref 26.0–34.0)
MCHC: 34.3 g/dL (ref 30.0–36.0)
MCV: 87.5 fL (ref 78.0–100.0)
Platelets: 211 10*3/uL (ref 150–400)
RBC: 4.87 MIL/uL (ref 4.22–5.81)
RDW: 12.8 % (ref 11.5–15.5)
WBC: 7.5 10*3/uL (ref 4.0–10.5)

## 2014-12-19 MED ORDER — TECHNETIUM TC 99M SESTAMIBI GENERIC - CARDIOLITE
10.0000 | Freq: Once | INTRAVENOUS | Status: AC | PRN
  Administered 2014-12-19: 10 via INTRAVENOUS

## 2014-12-19 MED ORDER — TECHNETIUM TC 99M SESTAMIBI GENERIC - CARDIOLITE
30.0000 | Freq: Once | INTRAVENOUS | Status: AC | PRN
  Administered 2014-12-19: 30 via INTRAVENOUS

## 2014-12-19 NOTE — Progress Notes (Signed)
GXT MV performed, 1 day study, [place] to read.  Rosaria Ferries, Hershal Coria 12/19/2014 9:18 AM Beeper [phone]

## 2014-12-19 NOTE — Discharge Summary (Signed)
PATIENT DETAILS Name: Albert [name] Age: 59 y.o. Sex: male Date of Birth: 27-Feb-1956 MRN: [id number]. Admitting Physician: Bonnielee Haff, MD WCB:JSEGB, Erline Levine, MD  Admit Date: 12/18/2014 Discharge date: 12/19/2014  Recommendations for Outpatient Follow-up:  1. Gen Health Maintenance.   PRIMARY DISCHARGE DIAGNOSIS:  Principal Problem:   Chest pain Active Problems:   DM (diabetes mellitus), type 2 with neurological complications   HTN (hypertension)   Diarrhea   Hyperlipidemia      PAST MEDICAL HISTORY: Past Medical History  Diagnosis Date  . Hypertension   . History of chicken pox     childhood  . Seasonal allergies   . History of kidney stones   . 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 Sciatica of right side 12/22/2012  . Insomnia 02/25/2013  . Diverticulosis 10/26/2013  . Colon polyps     adenomatous  . DM (diabetes mellitus), type 2 with neurological complications 1/51/7616  . Hyperlipidemia 07/26/2014  . 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 Preventative health care 07/26/2014    DISCHARGE MEDICATIONS: Current Discharge Medication List    CONTINUE these medications which have NOT CHANGED   Details  aspirin EC 81 MG tablet Take 1 tablet (81 mg total) by mouth daily.    atorvastatin (LIPITOR) 40 MG tablet Take 1 tablet (40 mg total) by mouth daily. Qty: 90 tablet, Refills: 1    Blood Glucose Monitoring Suppl (ONE TOUCH ULTRA SYSTEM KIT) W/DEVICE KIT Use as directed to check blood sugar.  Diagnosis code E11.40    gabapentin (NEURONTIN) 300 MG capsule Take 1 capsule (300 mg total) by mouth at bedtime. Qty: 90 capsule, Refills: 3    glucose blood test strip Use as directed twice daily to check blood sugar.  Diagnosis code E11.40    Insulin Glargine (LANTUS) 100 UNIT/ML Solostar Pen Inject 10 Units into the skin daily. Qty: 15 mL, Refills: 6    lisinopril (PRINIVIL,ZESTRIL) 10 MG tablet TAKE 1 TABLET  BY MOUTH 2 TIMES DAILY. Qty: 180 tablet, Refills: 0    loperamide (IMODIUM A-D) 2 MG tablet Take 2 mg by mouth 4 (four) times daily as needed for diarrhea or loose stools.    Multiple Vitamins-Minerals (MULTIVITAMIN WITH MINERALS) tablet Take 1 tablet by mouth daily.    nitroGLYCERIN (NITROSTAT) 0.4 MG SL tablet Place 1 tablet (0.4 mg total) under the tongue every 5 (five) minutes as needed for chest pain. X 3 doses Qty: 25 tablet, Refills: 1    ranitidine (ZANTAC) 300 MG tablet Take 1 tablet (300 mg total) by mouth at bedtime. Qty: 30 tablet, Refills: 2    sitaGLIPtin-metformin (JANUMET) 50-500 MG per tablet Take 1 tablet by mouth 2 (two) times daily with a meal. Qty: 180 tablet, Refills: 1   Associated Diagnoses: Diabetes mellitus due to underlying condition without complications        ALLERGIES:   Allergies  Allergen Reactions  . Contrast Media [Iodinated Diagnostic Agents] Swelling    Can take if premedicated with diphenhydramine  . Iodine Swelling    BRIEF HPI:  See H&P, Labs, Consult and Test reports for all details in brief, patient  is a 59 y.o. male with a past medical history of diabetes on insulin and oral agents, hypertension, hypercholesterolemia admitted for evaluation of chest pain.  CONSULTATIONS:   cardiology  PERTINENT RADIOLOGIC STUDIES: Dg Chest 2 View  12/18/2014   CLINICAL DATA:  Dizziness and diaphoresis  EXAM: CHEST  2 VIEW  COMPARISON:  August 16, 2014  FINDINGS: Lungs are clear. Heart size and pulmonary vascularity are normal. No adenopathy. There is postoperative change in the right shoulder region.  IMPRESSION: No edema or consolidation.   Electronically Signed   By: Lowella Grip III M.D.   On: 12/18/2014 10:17   US Renal  12/19/2014   CLINICAL DATA:  Left flank pain  EXAM: RENAL / URINARY TRACT ULTRASOUND COMPLETE  COMPARISON:  None.  FINDINGS: Right Kidney:  Length: 10.9 cm. Echogenicity within normal limits. No mass or hydronephrosis  visualized.  Left Kidney:  Length: 12.4 cm. Echogenicity within normal limits. No mass or hydronephrosis visualized.  Bladder:  Appears normal for degree of bladder distention.  IMPRESSION: Normal.   Electronically Signed   By: Andreas Newport M.D.   On: 12/19/2014 04:29   Nm Myocar Multi W/spect W/wall Motion / Ef  12/19/2014   CLINICAL DATA:  Chest pain, diabetes, hypertension.  EXAM: MYOCARDIAL IMAGING WITH SPECT (REST AND EXERCISE)  GATED LEFT VENTRICULAR WALL MOTION STUDY  LEFT VENTRICULAR EJECTION FRACTION  TECHNIQUE: Standard myocardial SPECT imaging was performed after resting intravenous injection of 10.0 mCi Tc-29msestamibi. Subsequently, exercise tolerance test was performed by the patient under the supervision of the Cardiology staff. At peak-stress, 30.0 mCi [id number] was injected intravenously and standard myocardial SPECT imaging was performed. Quantitative gated imaging was also performed to evaluate left ventricular wall motion, and estimate left ventricular ejection fraction.  COMPARISON:  06/10/2008 by report only  FINDINGS: Perfusion: No decreased activity in the left ventricle on stress imaging to suggest reversible ischemia or infarction.  Wall Motion: Normal left ventricular wall motion. No left ventricular dilation.  Left Ventricular Ejection Fraction: 66 %  End diastolic volume 67 ml  End systolic volume 22 ml  IMPRESSION: 1. No reversible ischemia or infarction.  2. Normal left ventricular wall motion.  3. Left ventricular ejection fraction 66%  4. Low-risk stress test findings*.  *2012 Appropriate Use Criteria for Coronary Revascularization Focused Update: J Am Coll Cardiol. 206283;66(2):947-654http://content.onairportbarriers.comspx?articleid=1201161   Electronically Signed   By: D Lucrezia Europe.D.   On: 12/19/2014 13:33     PERTINENT LAB RESULTS: CBC:  Recent Labs  12/18/14 0950 12/19/14 0615  WBC 8.1 7.5  HGB 14.8 14.6  HCT 43.3 42.6  PLT 208 211    CMET CMP     Component Value Date/Time   NA 135 12/19/2014 0615   K 3.8 12/19/2014 0615   CL 104 12/19/2014 0615   CO2 23 12/19/2014 0615   GLUCOSE 138* 12/19/2014 0615   BUN 10 12/19/2014 0615   CREATININE 0.81 12/19/2014 0615   CREATININE 0.90 03/30/2014 0858   CALCIUM 8.9 12/19/2014 0615   PROT 7.5 10/26/2014 1624   ALBUMIN 4.3 10/26/2014 1624   AST 14 10/26/2014 1624   ALT 21 10/26/2014 1624   ALKPHOS 82 10/26/2014 1624   BILITOT 0.7 10/26/2014 1624   GFRNONAA >60 12/19/2014 0615   GFRAA >60 12/19/2014 0615    GFR Estimated Creatinine Clearance: 111.8 mL/min (by C-G formula based on Cr of 0.81). No results for input(s): LIPASE, AMYLASE in the last 72 hours.  Recent Labs  12/18/14 1353 12/18/14 1652 12/18/14 1832  TROPONINI <0.03 <0.03 <0.03   Invalid input(s): POCBNP No results for input(s): DDIMER in the last 72 hours.  Recent Labs  12/18/14 1353  HGBA1C 8.4*   No results for input(s): CHOL, HDL, LDLCALC, TRIG, CHOLHDL, LDLDIRECT in the last 72 hours.  Recent Labs  12/18/14 1353  TSH 2.917   No results for input(s): VITAMINB12, FOLATE, FERRITIN, TIBC, IRON, RETICCTPCT in the last 72 hours. Coags: No results for input(s): INR in the last 72 hours.  Invalid input(s): PT Microbiology: No results found for this or any previous visit (from the past 240 hour(s)).   BRIEF HOSPITAL COURSE:   Principal Problem:   Chest pain:with mostly atypical features, had seen cardiology as outpatient-who planned a outpatient stress test. Because of recurrent chest pain, patient was admitted, cardiac enzymes were cycled, card was consulted. Post admission, he was completely chest pain free. Underwent a Nuc Stress Test that was negative. No further recommendations from cardiology, will discharge patient home today. Have instructed patient to follow up as outpatient.   Active Problems:   DM (diabetes mellitus), type 2 with neurological complications: CBG's stable,  resume Lantus and Janumet on discharge. Follow with PCP for further optimization     HTN (hypertension):Controlled, continue Lisinopril    Hyperlipidemia:continue statin  TODAY-DAY OF DISCHARGE:  Subjective:   Jaclynn Major today has no headache,no chest abdominal pain,no new weakness tingling or numbness, feels much better wants to go home today.   Objective:   Blood pressure 116/69, pulse 150, temperature 98 F (36.7 C), temperature source Oral, resp. rate 18, height 6' (1.829 m), weight 85.095 kg (187 lb 9.6 oz), SpO2 98 %.  Intake/Output Summary (Last 24 hours) at 12/19/14 1425 Last data filed at 12/19/14 0900  Gross per 24 hour  Intake  597.5 ml  Output    800 ml  Net -202.5 ml   Filed Weights   12/18/14 0953 12/18/14 1319 12/19/14 0500  Weight: 87.998 kg (194 lb) 85.5 kg (188 lb 7.9 oz) 85.095 kg (187 lb 9.6 oz)    Exam Awake Alert, Oriented *3, No new F.N deficits, Normal affect [place].AT,PERRAL Supple Neck,No JVD, No cervical lymphadenopathy appriciated.  Symmetrical Chest wall movement, Good air movement bilaterally, CTAB RRR,No Gallops,Rubs or new Murmurs, No Parasternal Heave +ve B.Sounds, Abd Soft, Non tender, No organomegaly appriciated, No rebound -guarding or rigidity. No Cyanosis, Clubbing or edema, No new Rash or bruise  DISCHARGE CONDITION: Stable  DISPOSITION: Home  DISCHARGE INSTRUCTIONS:    Activity:  As tolerated   Diet recommendation: Diabetic Diet Heart Healthy diet  Discharge Instructions    Diet - low sodium heart healthy    Complete by:  As directed      Diet Carb Modified    Complete by:  As directed      Increase activity slowly    Complete by:  As directed            Follow-up Information    Follow up with [name], MD. Schedule an appointment as soon as possible for a visit in 1 week.   Specialty:  Family Medicine   Contact information:   [place] [address] [phone]      Total  Time spent on discharge equals 25 minutes.  SignedOren Binet 12/19/2014 2:25 PM

## 2014-12-19 NOTE — Progress Notes (Signed)
Dr Hal Hope notified of sudden onset L flank pain. Pt states he has 1 kidney stone left of the 3  that he had recently. New orders obtained . 1000cc NSS hung and infusing at 75 cc/hr as ordered. Orders placed for microscopic UA and renal ultrasound when possible. VSS

## 2014-12-19 NOTE — Progress Notes (Signed)
Renal Ultrasound obtained. Medicated for increasing L flank pain.

## 2014-12-19 NOTE — Progress Notes (Signed)
Patient Name: Albert [name] Date of Encounter: 12/19/2014  Principal Problem:   Chest pain Active Problems:   DM (diabetes mellitus), type 2 with neurological complications   HTN (hypertension)   Diarrhea   Hyperlipidemia   Primary Cardiologist: Dr Stanford Breed  Patient Profile: 59 yo male w/ hx HTN, DM, HL, no CAD, admitted w/ chest pain, cards consult.  SUBJECTIVE: No more chest pain, no SOB.  OBJECTIVE Filed Vitals:   12/18/14 2215 12/18/14 2232 12/19/14 0123 12/19/14 0500  BP: 101/66 106/64 102/69 114/70  Pulse:  80 77 76  Temp:  97.8 F (36.6 C) 97.9 F (36.6 C) 97.9 F (36.6 C)  TempSrc:  Oral Oral Oral  Resp:  17 18   Height:    6' (1.829 m)  Weight:    187 lb 9.6 oz (85.095 kg)  SpO2:  99% 98% 100%    Intake/Output Summary (Last 24 hours) at 12/19/14 0758 Last data filed at 12/19/14 0600  Gross per 24 hour  Intake  597.5 ml  Output    800 ml  Net -202.5 ml   Filed Weights   12/18/14 0953 12/18/14 1319 12/19/14 0500  Weight: 194 lb (87.998 kg) 188 lb 7.9 oz (85.5 kg) 187 lb 9.6 oz (85.095 kg)    PHYSICAL EXAM General: Well developed, well nourished, male in no acute distress. Head: Normocephalic, atraumatic.  Neck: Supple without bruits, JVD not elevated. Lungs:  Resp regular and unlabored, CTA. Heart: RRR, S1, S2, no S3, S4, or murmur; no rub. Abdomen: Soft, non-tender, non-distended, BS + x 4.  Extremities: No clubbing, cyanosis, edema.  Neuro: Alert and oriented X 3. Moves all extremities spontaneously. Psych: Normal affect.  LABS: CBC: Recent Labs  12/18/14 0950 12/19/14 0615  WBC 8.1 7.5  NEUTROABS 4.0  --   HGB 14.8 14.6  HCT 43.3 42.6  MCV 88.2 87.5  PLT 208 458   Basic Metabolic Panel: Recent Labs  12/18/14 0950 12/19/14 0615  NA 135 135  K 3.7 3.8  CL 101 104  CO2 24 23  GLUCOSE 152* 138*  BUN 12 10  CREATININE 0.87 0.81  CALCIUM 9.3 8.9   Cardiac Enzymes: Recent Labs  12/18/14 1353 12/18/14 1652 12/18/14 1832    TROPONINI <0.03 <0.03 <0.03   Hemoglobin A1C: Recent Labs  12/18/14 1353  HGBA1C 8.4*   Thyroid Function Tests: Recent Labs  12/18/14 1353  TSH 2.917   TELE:  SR, no sig ectopy      Radiology/Studies: Dg Chest 2 View  12/18/2014   CLINICAL DATA:  Dizziness and diaphoresis  EXAM: CHEST  2 VIEW  COMPARISON:  August 16, 2014  FINDINGS: Lungs are clear. Heart size and pulmonary vascularity are normal. No adenopathy. There is postoperative change in the right shoulder region.  IMPRESSION: No edema or consolidation.   Electronically Signed   By: Lowella Grip III M.D.   On: 12/18/2014 10:17   US Renal  12/19/2014   CLINICAL DATA:  Left flank pain  EXAM: RENAL / URINARY TRACT ULTRASOUND COMPLETE  COMPARISON:  None.  FINDINGS: Right Kidney:  Length: 10.9 cm. Echogenicity within normal limits. No mass or hydronephrosis visualized.  Left Kidney:  Length: 12.4 cm. Echogenicity within normal limits. No mass or hydronephrosis visualized.  Bladder:  Appears normal for degree of bladder distention.  IMPRESSION: Normal.   Electronically Signed   By: Andreas Newport M.D.   On: 12/19/2014 04:29     Current Medications:  . aspirin  EC  81 mg Oral Daily  . atorvastatin  40 mg Oral Daily  . enoxaparin (LOVENOX) injection  40 mg Subcutaneous Q24H  . famotidine  20 mg Oral QHS  . gabapentin  300 mg Oral QHS  . insulin aspart  0-15 Units Subcutaneous TID WC  . insulin glargine  10 Units Subcutaneous Daily  . lisinopril  10 mg Oral BID   . sodium chloride 75 mL/hr at 12/19/14 0600    ASSESSMENT AND PLAN: Principal Problem:   Chest pain - ez neg MI, MV today to assess for ischemia - no further workup if negative  Otherwise, per IM Active Problems:   DM (diabetes mellitus), type 2 with neurological complications   HTN (hypertension)   Diarrhea   Hyperlipidemia   Signed, Rosaria Ferries , PA-C 7:58 AM 12/19/2014

## 2014-12-20 ENCOUNTER — Encounter: Payer: Self-pay | Admitting: Family Medicine

## 2014-12-20 NOTE — Progress Notes (Signed)
Albert [name]  [id number] Feb 14, 1956 12/20/2014      Progress Note-Follow Up  Subjective  Chief Complaint  Chief Complaint  Patient presents with  . Follow-up    6 week    HPI  Patient is a 59 y.o. male in today for routine medical care. Patient in today for follow-up. Blood sugars have been. He notes low of 53, but a high of 370. Lower numbers are more recent with medication adjustments. For the last 2 weeks. He has not had a number above 200. Denies polyuria and polydipsia. Is trying to maintain a low carbohydrate diet. Denies CP/palp/SOB/HA/congestion/fevers/GI or GU c/o. Taking meds as prescribed Past Medical History  Diagnosis Date  . Hypertension   . History of chicken pox     childhood  . Seasonal allergies   . History of kidney stones   . 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 Sciatica of right side 12/22/2012  . Insomnia 02/25/2013  . Diverticulosis 10/26/2013  . Colon polyps     adenomatous  . DM (diabetes mellitus), type 2 with neurological complications 03/16/2335  . Hyperlipidemia 07/26/2014  . 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 Preventative health care 07/26/2014    Past Surgical History  Procedure Laterality Date  . Hernia repair    . Abdominal hernia repair    . Rotator cuff repair    . Cholecystectomy    . Mass excision  05/22/2012    Procedure: EXCISION MASS;  Surgeon: Joyice Faster. Cornett, MD;  Location: [facility];  Service: General;  Laterality: Left;  excision abdominal wall mass  . Laparoscopic appendectomy N/A 08/16/2014    Procedure: APPENDECTOMY LAPAROSCOPIC;  Surgeon: Erroll Luna, MD;  Location: [facility] OR;  Service: General;  Laterality: N/A;    Family History  Problem Relation Age of Onset  . Lung cancer Mother   . Breast cancer Neg Hx   . Colon cancer Neg Hx   . Prostate cancer Neg Hx   . Heart disease Neg Hx   . Diabetes Maternal Aunt   . Cancer Paternal  Grandmother     History   Social History  . Marital Status: Married    Spouse Name: N/A  . Number of Children: 3  . Years of Education: N/A   Occupational History  .      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 Smoking status: Never Smoker   . Smokeless tobacco: Never Used  . Alcohol Use: No  . Drug Use: No  . Sexual Activity: Not on file   Other Topics Concern  . Not on file   Social History Narrative    Current Outpatient Prescriptions on File Prior to Visit  Medication Sig Dispense Refill  . aspirin EC 81 MG tablet Take 1 tablet (81 mg total) by mouth daily.    Marland Kitchen atorvastatin (LIPITOR) 40 MG tablet Take 1 tablet (40 mg total) by mouth daily. 90 tablet 1  . Blood Glucose Monitoring Suppl (ONE TOUCH ULTRA SYSTEM KIT) W/DEVICE KIT Use as directed to check blood sugar.  Diagnosis code E11.40    . gabapentin (NEURONTIN) 300 MG capsule Take 1 capsule (300 mg total) by mouth at bedtime. 90 capsule 3  . glucose blood test strip Use as directed twice daily to check blood sugar.  Diagnosis code E11.40    . Insulin Glargine (LANTUS) 100 UNIT/ML Solostar Pen Inject 10 Units into  the skin daily. (Patient taking differently: Inject 12 Units into the skin daily. ) 15 mL 6  . lisinopril (PRINIVIL,ZESTRIL) 10 MG tablet TAKE 1 TABLET BY MOUTH 2 TIMES DAILY. 180 tablet 0  . loperamide (IMODIUM A-D) 2 MG tablet Take 2 mg by mouth 4 (four) times daily as needed for diarrhea or loose stools.    . nitroGLYCERIN (NITROSTAT) 0.4 MG SL tablet Place 1 tablet (0.4 mg total) under the tongue every 5 (five) minutes as needed for chest pain. X 3 doses 25 tablet 1  . ranitidine (ZANTAC) 300 MG tablet Take 1 tablet (300 mg total) by mouth at bedtime. 30 tablet 2  . sitaGLIPtin-metformin (JANUMET) 50-500 MG per tablet Take 1 tablet by mouth 2 (two) times daily with a meal. 180 tablet 1   No current facility-administered medications on file prior to visit.    Allergies  Allergen Reactions    . Contrast Media [Iodinated Diagnostic Agents] Swelling    Can take if premedicated with diphenhydramine  . Iodine Swelling    Review of Systems  Review of Systems  Constitutional: Positive for malaise/fatigue. Negative for fever.  HENT: Negative for congestion.   Eyes: Negative for discharge.  Respiratory: Negative for shortness of breath.   Cardiovascular: Negative for chest pain, palpitations and leg swelling.  Gastrointestinal: Negative for nausea, abdominal pain and diarrhea.  Genitourinary: Negative for dysuria.  Musculoskeletal: Negative for falls.  Skin: Negative for rash.  Neurological: Negative for loss of consciousness and headaches.  Endo/Heme/Allergies: Negative for polydipsia.  Psychiatric/Behavioral: Negative for depression and suicidal ideas. The patient is not nervous/anxious and does not have insomnia.     Objective  Pulse 83  Temp(Src) 98.4 F (36.9 C) (Oral)  Ht 6' (1.829 m)  Wt 194 lb 6 oz (88.168 kg)  BMI 26.36 kg/m2  SpO2 99%  Physical Exam  Physical Exam  Constitutional: He is oriented to person, place, and time and well-developed, well-nourished, and in no distress. No distress.  HENT:  Head: Normocephalic and atraumatic.  Eyes: Conjunctivae are normal.  Neck: Neck supple. No thyromegaly present.  Cardiovascular: Normal rate, regular rhythm and normal heart sounds.   Pulmonary/Chest: Effort normal and breath sounds normal. No respiratory distress.  Abdominal: He exhibits no distension and no mass. There is no tenderness.  Musculoskeletal: He exhibits no edema.  Neurological: He is alert and oriented to person, place, and time.  Skin: Skin is warm.  Psychiatric: Memory, affect and judgment normal.    Lab Results  Component Value Date   TSH 2.917 12/18/2014   Lab Results  Component Value Date   WBC 7.5 12/19/2014   HGB 14.6 12/19/2014   HCT 42.6 12/19/2014   MCV 87.5 12/19/2014   PLT 211 12/19/2014   Lab Results  Component Value  Date   CREATININE 0.81 12/19/2014   BUN 10 12/19/2014   NA 135 12/19/2014   K 3.8 12/19/2014   CL 104 12/19/2014   CO2 23 12/19/2014   Lab Results  Component Value Date   ALT 21 10/26/2014   AST 14 10/26/2014   ALKPHOS 82 10/26/2014   BILITOT 0.7 10/26/2014   Lab Results  Component Value Date   CHOL 177 10/26/2014   Lab Results  Component Value Date   HDL 34.30* 10/26/2014   Lab Results  Component Value Date   LDLCALC 63 03/30/2014   Lab Results  Component Value Date   TRIG 229.0* 10/26/2014   Lab Results  Component Value Date     CHOLHDL 5 10/26/2014     Assessment & Plan  HTN (hypertension) Well controlled, no changes to meds. Encouraged heart healthy diet such as the DASH diet and exercise as tolerated.    DM (diabetes mellitus), type 2 with neurological complications Labile, minimize simple carbs. Increase exercise as tolerated. Continue current meds. Will need to adjust Lantus as tolerated and as needed if BS remain consistently above 130   Hyperlipidemia Tolerating statin, encouraged heart healthy diet, avoid trans fats, minimize simple carbs and saturated fats. Increase exercise as tolerated   Rib pain on right side Nodule on right breast for a couple of days better today, if persits or worsens may need to proceed with imaging. Patient will monitor for now.

## 2014-12-20 NOTE — Assessment & Plan Note (Signed)
Tolerating statin, encouraged heart healthy diet, avoid trans fats, minimize simple carbs and saturated fats. Increase exercise as tolerated 

## 2014-12-20 NOTE — Assessment & Plan Note (Signed)
Nodule on right breast for a couple of days better today, if persits or worsens may need to proceed with imaging. Patient will monitor for now.

## 2014-12-20 NOTE — Assessment & Plan Note (Addendum)
Labile, minimize simple carbs. Increase exercise as tolerated. Continue current meds. Will need to adjust Lantus as tolerated and as needed if BS remain consistently above 130

## 2014-12-20 NOTE — Assessment & Plan Note (Signed)
Well controlled, no changes to meds. Encouraged heart healthy diet such as the DASH diet and exercise as tolerated.  °

## 2014-12-25 ENCOUNTER — Encounter (HOSPITAL_COMMUNITY): Payer: Self-pay

## 2015-01-01 ENCOUNTER — Encounter: Payer: Self-pay | Admitting: Physician Assistant

## 2015-01-01 ENCOUNTER — Ambulatory Visit (INDEPENDENT_AMBULATORY_CARE_PROVIDER_SITE_OTHER): Payer: 59 | Admitting: Physician Assistant

## 2015-01-01 VITALS — BP 122/82 | HR 95 | Temp 98.1°F | Resp 16 | Ht 72.0 in | Wt 189.1 lb

## 2015-01-01 DIAGNOSIS — R0789 Other chest pain: Secondary | ICD-10-CM

## 2015-01-01 NOTE — Progress Notes (Signed)
Patient presents to clinic today for hospital follow-up of atypical chest pain.  Patient seen in ER on 12/18/14 with complaints of chest pain and dizziness.  Initial workup negative but patient admitted to rule out cardiac etiology.  Patient was found to be dehydrated and rehydrated with IV fluids.  Troponins and serial EKGS negative.  Myoview performed and unremarkable.  Patient symptoms resolved with rest and rehydration.  Was discharged with PCP follow-up for general health maintenance.  Patient denies recurrence of symptoms.  Denies chest pain, palpitations, lightheadedness or dizziness.  Endorses good fluid intake and well-balanced diet.  Endorses good urinary and bowel output. Is taking medications as directed. Has had fasting CBGs averaging 90s-140s.    Past Medical History  Diagnosis Date  . Hypertension   . History of chicken pox     childhood  . Seasonal allergies   . History of kidney stones   . 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 Sciatica of right side 12/22/2012  . Insomnia 02/25/2013  . Diverticulosis 10/26/2013  . Colon polyps     adenomatous  . DM (diabetes mellitus), type 2 with neurological complications 7/41/6384  . Hyperlipidemia 07/26/2014  . 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 Preventative health care 07/26/2014    Current Outpatient Prescriptions on File Prior to Visit  Medication Sig Dispense Refill  . aspirin EC 81 MG tablet Take 1 tablet (81 mg total) by mouth daily.    Marland Kitchen atorvastatin (LIPITOR) 40 MG tablet Take 1 tablet (40 mg total) by mouth daily. 90 tablet 1  . Blood Glucose Monitoring Suppl (ONE TOUCH ULTRA SYSTEM KIT) W/DEVICE KIT Use as directed to check blood sugar.  Diagnosis code E11.40    . gabapentin (NEURONTIN) 300 MG capsule Take 1 capsule (300 mg total) by mouth at bedtime. 90 capsule 3  . glucose blood test strip Use as directed twice daily to check blood sugar.  Diagnosis code  E11.40    . Insulin Glargine (LANTUS) 100 UNIT/ML Solostar Pen Inject 10 Units into the skin daily. (Patient taking differently: Inject 12 Units into the skin daily. ) 15 mL 6  . lisinopril (PRINIVIL,ZESTRIL) 10 MG tablet TAKE 1 TABLET BY MOUTH 2 TIMES DAILY. 180 tablet 0  . loperamide (IMODIUM A-D) 2 MG tablet Take 2 mg by mouth 4 (four) times daily as needed for diarrhea or loose stools.    . Multiple Vitamins-Minerals (MULTIVITAMIN WITH MINERALS) tablet Take 1 tablet by mouth daily.    . nitroGLYCERIN (NITROSTAT) 0.4 MG SL tablet Place 1 tablet (0.4 mg total) under the tongue every 5 (five) minutes as needed for chest pain. X 3 doses 25 tablet 1  . ranitidine (ZANTAC) 300 MG tablet Take 1 tablet (300 mg total) by mouth at bedtime. 30 tablet 2  . sitaGLIPtin-metformin (JANUMET) 50-500 MG per tablet Take 1 tablet by mouth 2 (two) times daily with a meal. 180 tablet 1   No current facility-administered medications on file prior to visit.    Allergies  Allergen Reactions  . Contrast Media [Iodinated Diagnostic Agents] Swelling    Can take if premedicated with diphenhydramine  . Iodine Swelling    Family History  Problem Relation Age of Onset  . Lung cancer Mother   . Breast cancer Neg Hx   . Colon cancer Neg Hx   . Prostate cancer Neg Hx   . Heart disease Neg Hx   . Diabetes Maternal  Aunt   . Cancer Paternal Grandmother     History   Social History  . Marital Status: Married    Spouse Name: N/A  . Number of Children: 3  . Years of Education: N/A   Occupational History  .      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 Smoking status: Never Smoker   . Smokeless tobacco: Never Used  . Alcohol Use: No  . Drug Use: No  . Sexual Activity: Not on file   Other Topics Concern  . None   Social History Narrative   Review of Systems - See HPI.  All other ROS are negative.  BP 122/82 mmHg  Pulse 95  Temp(Src) 98.1 F (36.7 C) (Oral)  Resp 16  Ht 6' (1.829 m)   Wt 189 lb 2 oz (85.787 kg)  BMI 25.64 kg/m2  SpO2 96%  Physical Exam  Constitutional: He is oriented to person, place, and time and well-developed, well-nourished, and in no distress.  HENT:  Head: Normocephalic and atraumatic.  Mouth/Throat: Oropharynx is clear and moist. No oropharyngeal exudate.  Eyes: Conjunctivae are normal.  Neck: Neck supple. No thyromegaly present.  Cardiovascular: Normal rate, regular rhythm, normal heart sounds and intact distal pulses.   Pulmonary/Chest: Effort normal and breath sounds normal. No respiratory distress. He has no wheezes. He has no rales. He exhibits no tenderness.  Lymphadenopathy:    He has no cervical adenopathy.  Neurological: He is alert and oriented to person, place, and time.  Skin: Skin is warm and dry. No rash noted.  Vitals reviewed.   Recent Results (from the past 2160 hour(s))  CBC     Status: None   Collection Time: 10/26/14  4:24 PM  Result Value Ref Range   WBC 7.5 4.0 - 10.5 K/uL   RBC 5.15 4.22 - 5.81 Mil/uL   Platelets 182.0 150.0 - 400.0 K/uL   Hemoglobin 15.6 13.0 - 17.0 g/dL   HCT 45.3 39.0 - 52.0 %   MCV 87.9 78.0 - 100.0 fl   MCHC 34.4 30.0 - 36.0 g/dL   RDW 14.7 11.5 - 15.5 %  TSH     Status: None   Collection Time: 10/26/14  4:24 PM  Result Value Ref Range   TSH 1.28 0.35 - 4.50 uIU/mL  Lipid panel     Status: Abnormal   Collection Time: 10/26/14  4:24 PM  Result Value Ref Range   Cholesterol 177 0 - 200 mg/dL    Comment: ATP III Classification       Desirable:  < 200 mg/dL               Borderline High:  200 - 239 mg/dL          High:  > = 240 mg/dL   Triglycerides 229.0 (H) 0.0 - 149.0 mg/dL    Comment: Normal:  <150 mg/dLBorderline High:  150 - 199 mg/dL   HDL 34.30 (L) >39.00 mg/dL   VLDL 45.8 (H) 0.0 - 40.0 mg/dL   Total CHOL/HDL Ratio 5     Comment:                Men          Women1/2 Average Risk     3.4          3.3Average Risk          5.0          4.42X Average Risk  9.6           7.13X Average Risk          15.0          11.0                       NonHDL 142.70     Comment: NOTE:  Non-HDL goal should be 30 mg/dL higher than patient's LDL goal (i.e. LDL goal of < 70 mg/dL, would have non-HDL goal of < 100 mg/dL)  Hemoglobin A1c     Status: Abnormal   Collection Time: 10/26/14  4:24 PM  Result Value Ref Range   Hgb A1c MFr Bld 11.9 Repeated and verified X2. (H) 4.6 - 6.5 %    Comment: Glycemic Control Guidelines for People with Diabetes:Non Diabetic:  <6%Goal of Therapy: <7%Additional Action Suggested:  >8%   Comprehensive metabolic panel     Status: Abnormal   Collection Time: 10/26/14  4:24 PM  Result Value Ref Range   Sodium 132 (L) 135 - 145 mEq/L   Potassium 3.8 3.5 - 5.1 mEq/L   Chloride 99 96 - 112 mEq/L   CO2 27 19 - 32 mEq/L   Glucose, Bld 263 (H) 70 - 99 mg/dL   BUN 10 6 - 23 mg/dL   Creatinine, Ser 0.84 0.40 - 1.50 mg/dL   Total Bilirubin 0.7 0.2 - 1.2 mg/dL   Alkaline Phosphatase 82 39 - 117 U/L   AST 14 0 - 37 U/L   ALT 21 0 - 53 U/L   Total Protein 7.5 6.0 - 8.3 g/dL   Albumin 4.3 3.5 - 5.2 g/dL   Calcium 9.6 8.4 - 10.5 mg/dL   GFR 100.29 >60.00 mL/min  LDL cholesterol, direct     Status: None   Collection Time: 10/26/14  4:24 PM  Result Value Ref Range   Direct LDL 109.0 mg/dL    Comment: Optimal:  <100 mg/dLNear or Above Optimal:  100-129 mg/dLBorderline High:  130-159 mg/dLHigh:  160-189 mg/dLVery High:  >190 mg/dL  CBG monitoring, ED     Status: Abnormal   Collection Time: 12/18/14  9:43 AM  Result Value Ref Range   Glucose-Capillary 141 (H) 70 - 99 mg/dL  Basic metabolic panel     Status: Abnormal   Collection Time: 12/18/14  9:50 AM  Result Value Ref Range   Sodium 135 135 - 145 mmol/L   Potassium 3.7 3.5 - 5.1 mmol/L   Chloride 101 101 - 111 mmol/L   CO2 24 22 - 32 mmol/L   Glucose, Bld 152 (H) 70 - 99 mg/dL   BUN 12 6 - 20 mg/dL   Creatinine, Ser 0.87 0.61 - 1.24 mg/dL   Calcium 9.3 8.9 - 10.3 mg/dL   GFR calc non Af Amer >60  >60 mL/min   GFR calc Af Amer >60 >60 mL/min    Comment: (NOTE) The eGFR has been calculated using the CKD EPI equation. This calculation has not been validated in all clinical situations. eGFR's persistently <60 mL/min signify possible Chronic Kidney Disease.    Anion gap 10 5 - 15  CBC with Differential     Status: Abnormal   Collection Time: 12/18/14  9:50 AM  Result Value Ref Range   WBC 8.1 4.0 - 10.5 K/uL   RBC 4.91 4.22 - 5.81 MIL/uL   Hemoglobin 14.8 13.0 - 17.0 g/dL   HCT 43.3 39.0 - 52.0 %   MCV 88.2 78.0 - 100.0  fL   MCH 30.1 26.0 - 34.0 pg   MCHC 34.2 30.0 - 36.0 g/dL   RDW 12.9 11.5 - 15.5 %   Platelets 208 150 - 400 K/uL   Neutrophils Relative % 49 43 - 77 %   Neutro Abs 4.0 1.7 - 7.7 K/uL   Lymphocytes Relative 35 12 - 46 %   Lymphs Abs 2.8 0.7 - 4.0 K/uL   Monocytes Relative 9 3 - 12 %   Monocytes Absolute 0.7 0.1 - 1.0 K/uL   Eosinophils Relative 6 (H) 0 - 5 %   Eosinophils Absolute 0.5 0.0 - 0.7 K/uL   Basophils Relative 1 0 - 1 %   Basophils Absolute 0.1 0.0 - 0.1 K/uL  Troponin I     Status: None   Collection Time: 12/18/14  9:50 AM  Result Value Ref Range   Troponin I <0.03 <0.031 ng/mL    Comment:        NO INDICATION OF MYOCARDIAL INJURY.   Hemoglobin A1c     Status: Abnormal   Collection Time: 12/18/14  1:53 PM  Result Value Ref Range   Hgb A1c MFr Bld 8.4 (H) 4.8 - 5.6 %    Comment: (NOTE)         Pre-diabetes: 5.7 - 6.4         Diabetes: >6.4         Glycemic control for adults with diabetes: <7.0    Mean Plasma Glucose 194 mg/dL    Comment: (NOTE) Performed At: [facility] [place], [place] [id number] Lindon Romp MD [id number]   Troponin I-serum (0, 3, 6 hours)     Status: None   Collection Time: 12/18/14  1:53 PM  Result Value Ref Range   Troponin I <0.03 <0.031 ng/mL    Comment:        NO INDICATION OF MYOCARDIAL INJURY.   TSH     Status: None   Collection Time: 12/18/14  1:53 PM  Result Value  Ref Range   TSH 2.917 0.350 - 4.500 uIU/mL  Glucose, capillary     Status: None   Collection Time: 12/18/14  2:15 PM  Result Value Ref Range   Glucose-Capillary 81 70 - 99 mg/dL  Glucose, capillary     Status: Abnormal   Collection Time: 12/18/14  4:31 PM  Result Value Ref Range   Glucose-Capillary 159 (H) 70 - 99 mg/dL  Troponin I-serum (0, 3, 6 hours)     Status: None   Collection Time: 12/18/14  4:52 PM  Result Value Ref Range   Troponin I <0.03 <0.031 ng/mL    Comment:        NO INDICATION OF MYOCARDIAL INJURY.   Troponin I-serum (0, 3, 6 hours)     Status: None   Collection Time: 12/18/14  6:32 PM  Result Value Ref Range   Troponin I <0.03 <0.031 ng/mL    Comment:        NO INDICATION OF MYOCARDIAL INJURY.   Glucose, capillary     Status: Abnormal   Collection Time: 12/18/14  8:48 PM  Result Value Ref Range   Glucose-Capillary 109 (H) 70 - 99 mg/dL  Urinalysis, Routine w reflex microscopic     Status: None   Collection Time: 12/18/14 11:31 PM  Result Value Ref Range   Color, Urine YELLOW YELLOW   APPearance CLEAR CLEAR   Specific Gravity, Urine 1.007 1.005 - 1.030   pH 6.0 5.0 - 8.0  Glucose, UA NEGATIVE NEGATIVE mg/dL   Hgb urine dipstick NEGATIVE NEGATIVE   Bilirubin Urine NEGATIVE NEGATIVE   Ketones, ur NEGATIVE NEGATIVE mg/dL   Protein, ur NEGATIVE NEGATIVE mg/dL   Urobilinogen, UA 1.0 0.0 - 1.0 mg/dL   Nitrite NEGATIVE NEGATIVE   Leukocytes, UA NEGATIVE NEGATIVE    Comment: MICROSCOPIC NOT DONE ON URINES WITH NEGATIVE PROTEIN, BLOOD, LEUKOCYTES, NITRITE, OR GLUCOSE <1000 mg/dL.  CBC     Status: None   Collection Time: 12/19/14  6:15 AM  Result Value Ref Range   WBC 7.5 4.0 - 10.5 K/uL   RBC 4.87 4.22 - 5.81 MIL/uL   Hemoglobin 14.6 13.0 - 17.0 g/dL   HCT 42.6 39.0 - 52.0 %   MCV 87.5 78.0 - 100.0 fL   MCH 30.0 26.0 - 34.0 pg   MCHC 34.3 30.0 - 36.0 g/dL   RDW 12.8 11.5 - 15.5 %   Platelets 211 150 - 400 K/uL  Basic metabolic panel     Status:  Abnormal   Collection Time: 12/19/14  6:15 AM  Result Value Ref Range   Sodium 135 135 - 145 mmol/L   Potassium 3.8 3.5 - 5.1 mmol/L   Chloride 104 101 - 111 mmol/L   CO2 23 22 - 32 mmol/L   Glucose, Bld 138 (H) 70 - 99 mg/dL   BUN 10 6 - 20 mg/dL   Creatinine, Ser 0.81 0.61 - 1.24 mg/dL   Calcium 8.9 8.9 - 10.3 mg/dL   GFR calc non Af Amer >60 >60 mL/min   GFR calc Af Amer >60 >60 mL/min    Comment: (NOTE) The eGFR has been calculated using the CKD EPI equation. This calculation has not been validated in all clinical situations. eGFR's persistently <60 mL/min signify possible Chronic Kidney Disease.    Anion gap 8 5 - 15  Glucose, capillary     Status: Abnormal   Collection Time: 12/19/14  7:34 AM  Result Value Ref Range   Glucose-Capillary 148 (H) 70 - 99 mg/dL   Comment 1 Notify RN   Glucose, capillary     Status: Abnormal   Collection Time: 12/19/14 11:55 AM  Result Value Ref Range   Glucose-Capillary 135 (H) 70 - 99 mg/dL   Comment 1 Notify RN     Assessment/Plan: Atypical chest pain Extensive cardiac workup performed without worrisome findings.  No recurrence of symptoms.  Exam within normal limits.  Will continue to work on cholesterol and diabetes control.  Encouraged patient to continue Lantus titration until fasting CBG 80-120. Follow-up with PCP as scheduled.  If any symptoms recur, he is to call 911.

## 2015-01-01 NOTE — Assessment & Plan Note (Signed)
Extensive cardiac workup performed without worrisome findings.  No recurrence of symptoms.  Exam within normal limits.  Will continue to work on cholesterol and diabetes control.  Encouraged patient to continue Lantus titration until fasting CBG 80-120. Follow-up with PCP as scheduled.  If any symptoms recur, he is to call 911.

## 2015-01-01 NOTE — Patient Instructions (Signed)
Please stay well hydrated and eat a well-balanced diet. Continue current diabetes regimen, making sure to check sugars twice daily. Write these numbers down and bring to next visit with Dr. Charlett Blake. Our goal is for your fasting blood sugars to range 75-130 and it seems like we are getting there now.  If you notice sugars are getting <60, please reduce insulin 2 units until sugars back in goal range.

## 2015-01-01 NOTE — Progress Notes (Signed)
Pre visit review using our clinic review tool, if applicable. No additional management support is needed unless otherwise documented below in the visit note. 

## 2015-01-31 ENCOUNTER — Encounter (HOSPITAL_BASED_OUTPATIENT_CLINIC_OR_DEPARTMENT_OTHER): Payer: Self-pay | Admitting: *Deleted

## 2015-01-31 ENCOUNTER — Emergency Department (HOSPITAL_BASED_OUTPATIENT_CLINIC_OR_DEPARTMENT_OTHER): Payer: 59

## 2015-01-31 ENCOUNTER — Emergency Department (HOSPITAL_BASED_OUTPATIENT_CLINIC_OR_DEPARTMENT_OTHER)
Admission: EM | Admit: 2015-01-31 | Discharge: 2015-01-31 | Disposition: A | Discharge status: Home / Self Care | Payer: 59 | Attending: Emergency Medicine | Admitting: Emergency Medicine

## 2015-01-31 DIAGNOSIS — Z794 Long term (current) use of insulin: Secondary | ICD-10-CM | POA: Insufficient documentation

## 2015-01-31 DIAGNOSIS — Z7982 Long term (current) use of aspirin: Secondary | ICD-10-CM | POA: Insufficient documentation

## 2015-01-31 DIAGNOSIS — E785 Hyperlipidemia, unspecified: Secondary | ICD-10-CM | POA: Insufficient documentation

## 2015-01-31 DIAGNOSIS — Z8719 Personal history of other diseases of the digestive system: Secondary | ICD-10-CM | POA: Insufficient documentation

## 2015-01-31 DIAGNOSIS — R42 Dizziness and giddiness: Secondary | ICD-10-CM | POA: Insufficient documentation

## 2015-01-31 DIAGNOSIS — Z87442 Personal history of urinary calculi: Secondary | ICD-10-CM | POA: Insufficient documentation

## 2015-01-31 DIAGNOSIS — Z8669 Personal history of other diseases of the nervous system and sense organs: Secondary | ICD-10-CM | POA: Insufficient documentation

## 2015-01-31 DIAGNOSIS — E1140 Type 2 diabetes mellitus with diabetic neuropathy, unspecified: Secondary | ICD-10-CM | POA: Diagnosis not present

## 2015-01-31 DIAGNOSIS — Z79899 Other long term (current) drug therapy: Secondary | ICD-10-CM | POA: Insufficient documentation

## 2015-01-31 DIAGNOSIS — Z8619 Personal history of other infectious and parasitic diseases: Secondary | ICD-10-CM | POA: Insufficient documentation

## 2015-01-31 DIAGNOSIS — Z85828 Personal history of other malignant neoplasm of skin: Secondary | ICD-10-CM | POA: Insufficient documentation

## 2015-01-31 LAB — CBC WITH DIFFERENTIAL/PLATELET
BASOS ABS: 0 10*3/uL (ref 0.0–0.1)
Basophils Relative: 0 % (ref 0–1)
Eosinophils Absolute: 0.1 10*3/uL (ref 0.0–0.7)
Eosinophils Relative: 1 % (ref 0–5)
HEMATOCRIT: 38.8 % — AB (ref 39.0–52.0)
Hemoglobin: 13.6 g/dL (ref 13.0–17.0)
LYMPHS ABS: 1.6 10*3/uL (ref 0.7–4.0)
Lymphocytes Relative: 15 % (ref 12–46)
MCH: 30.3 pg (ref 26.0–34.0)
MCHC: 35.1 g/dL (ref 30.0–36.0)
MCV: 86.4 fL (ref 78.0–100.0)
MONO ABS: 0.7 10*3/uL (ref 0.1–1.0)
MONOS PCT: 7 % (ref 3–12)
NEUTROS ABS: 8 10*3/uL — AB (ref 1.7–7.7)
Neutrophils Relative %: 77 % (ref 43–77)
Platelets: 194 10*3/uL (ref 150–400)
RBC: 4.49 MIL/uL (ref 4.22–5.81)
RDW: 12.9 % (ref 11.5–15.5)
WBC: 10.4 10*3/uL (ref 4.0–10.5)

## 2015-01-31 LAB — BASIC METABOLIC PANEL
Anion gap: 11 (ref 5–15)
BUN: 14 mg/dL (ref 6–20)
CALCIUM: 9.3 mg/dL (ref 8.9–10.3)
CO2: 25 mmol/L (ref 22–32)
Chloride: 98 mmol/L — ABNORMAL LOW (ref 101–111)
Creatinine, Ser: 0.73 mg/dL (ref 0.61–1.24)
GFR calc non Af Amer: >60 mL/min (ref >60)
GLUCOSE: 276 mg/dL — AB (ref 65–99)
POTASSIUM: 3.8 mmol/L (ref 3.5–5.1)
Sodium: 134 mmol/L — ABNORMAL LOW (ref 135–145)

## 2015-01-31 LAB — CBG MONITORING, ED: GLUCOSE-CAPILLARY: 253 mg/dL — AB (ref 65–99)

## 2015-01-31 LAB — TROPONIN I: Troponin I: <0.03 ng/mL (ref <0.031)

## 2015-01-31 MED ORDER — SODIUM CHLORIDE 0.9 % IV BOLUS (SEPSIS)
1000.0000 mL | Freq: Once | INTRAVENOUS | Status: AC
  Administered 2015-01-31: 1000 mL via INTRAVENOUS

## 2015-01-31 MED ORDER — ONDANSETRON HCL 4 MG/2ML IJ SOLN
4.0000 mg | Freq: Once | INTRAMUSCULAR | Status: AC
  Administered 2015-01-31: 4 mg via INTRAVENOUS
  Filled 2015-01-31: qty 2

## 2015-01-31 NOTE — ED Notes (Signed)
NP at bedside.

## 2015-01-31 NOTE — ED Provider Notes (Signed)
CSN: [id number]     Arrival date & time 01/31/15  1147 History   First MD Initiated Contact with Patient 01/31/15 1202     Chief Complaint  Patient presents with  . Emesis  . 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 He states that he took his medications this morning. He had a large vanilla coffee and a bagel this morning. No cp or sob. Diaphoretic. Pt states that he is feeling a little better now just tired. Had similar episode last month with cp that he was admitted and evaluated for  The history is provided by the patient. No language interpreter was used.    Past Medical History  Diagnosis Date  . Hypertension   . History of chicken pox     childhood  . Seasonal allergies   . History of kidney stones   . 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 Sciatica of right side 12/22/2012  . Insomnia 02/25/2013  . Diverticulosis 10/26/2013  . Colon polyps     adenomatous  . DM (diabetes mellitus), type 2 with neurological complications 0/25/4270  . Hyperlipidemia 07/26/2014  . 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 Preventative health care 07/26/2014   Past Surgical History  Procedure Laterality Date  . Hernia repair    . Abdominal hernia repair    . Rotator cuff repair    . Cholecystectomy    . Mass excision  05/22/2012    Procedure: EXCISION MASS;  Surgeon: Joyice Faster. Cornett, MD;  Location: [facility];  Service: General;  Laterality: Left;  excision abdominal wall mass  . Laparoscopic appendectomy N/A 08/16/2014    Procedure: APPENDECTOMY LAPAROSCOPIC;  Surgeon: Erroll Luna, MD;  Location: [facility];  Service: General;  Laterality: N/A;   Family History  Problem Relation Age of Onset  . Lung  cancer Mother   . Breast cancer Neg Hx   . Colon cancer Neg Hx   . Prostate cancer Neg Hx   . Heart disease Neg Hx   . Diabetes Maternal Aunt   . Cancer Paternal Grandmother    History  Substance Use Topics  . Smoking status: Never Smoker   . Smokeless tobacco: Never Used  . Alcohol Use: No    Review of Systems  All other systems reviewed and are negative.     Allergies  Contrast media and Iodine  Home Medications   Prior to Admission medications   Medication Sig Start Date End Date Taking? Authorizing Provider  aspirin EC 81 MG tablet Take 1 tablet (81 mg total) by mouth daily. 10/26/14   Mosie Lukes, MD  atorvastatin (LIPITOR) 40 MG tablet Take 1 tablet (40 mg total) by mouth daily. 05/12/14   Mosie Lukes, MD  Blood Glucose Monitoring Suppl (ONE TOUCH ULTRA SYSTEM KIT) W/DEVICE KIT Use as directed to check blood sugar.  Diagnosis code E11.40    Historical Provider, MD  gabapentin (NEURONTIN) 300 MG capsule Take 1 capsule (300 mg total) by mouth at bedtime. 10/26/14   Mosie Lukes, MD  glucose blood test strip Use as directed twice daily to check blood sugar.  Diagnosis code E11.40    Historical Provider, MD  Insulin Glargine (LANTUS) 100 UNIT/ML Solostar Pen Inject 10 Units into the  skin daily. Patient taking differently: Inject 12 Units into the skin daily.  10/30/14   Mosie Lukes, MD  lisinopril (PRINIVIL,ZESTRIL) 10 MG tablet TAKE 1 TABLET BY MOUTH 2 TIMES DAILY. 11/03/14   Mosie Lukes, MD  loperamide (IMODIUM A-D) 2 MG tablet Take 2 mg by mouth 4 (four) times daily as needed for diarrhea or loose stools.    Historical Provider, MD  Multiple Vitamins-Minerals (MULTIVITAMIN WITH MINERALS) tablet Take 1 tablet by mouth daily.    Historical Provider, MD  nitroGLYCERIN (NITROSTAT) 0.4 MG SL tablet Place 1 tablet (0.4 mg total) under the tongue every 5 (five) minutes as needed for chest pain. X 3 doses 10/26/14   Mosie Lukes, MD  ranitidine (ZANTAC) 300 MG tablet  Take 1 tablet (300 mg total) by mouth at bedtime. 10/26/14   Mosie Lukes, MD  sitaGLIPtin-metformin (JANUMET) 50-500 MG per tablet Take 1 tablet by mouth 2 (two) times daily with a meal. 05/21/14   Mosie Lukes, MD   BP 114/58 mmHg  Pulse 88  Temp(Src) 97.7 F (36.5 C) (Oral)  Resp 9  Ht 6' (1.829 m)  Wt 189 lb (85.73 kg)  BMI 25.63 kg/m2  SpO2 99% Physical Exam  Constitutional: He is oriented to person, place, and time. He appears well-developed and well-nourished.  HENT:  Head: Normocephalic and atraumatic.  Eyes: Conjunctivae and EOM are normal. Pupils are equal, round, and reactive to light.  Cardiovascular: Normal rate and regular rhythm.   Pulmonary/Chest: Effort normal and breath sounds normal.  Abdominal: Soft. Bowel sounds are normal. There is no tenderness.  Musculoskeletal: Normal range of motion.  Neurological: He is alert and oriented to person, place, and time. He exhibits normal muscle tone. Coordination normal.  Skin: Skin is warm and dry.  Psychiatric: He has a normal mood and affect.  Nursing note and vitals reviewed.   ED Course  Procedures (including critical care time) Labs Review Labs Reviewed  BASIC METABOLIC PANEL - Abnormal; Notable for the following:    Sodium 134 (*)    Chloride 98 (*)    Glucose, Bld 276 (*)    All other components within normal limits  CBC WITH DIFFERENTIAL/PLATELET - Abnormal; Notable for the following:    HCT 38.8 (*)    Neutro Abs 8.0 (*)    All other components within normal limits  CBG MONITORING, ED - Abnormal; Notable for the following:    Glucose-Capillary 253 (*)    All other components within normal limits  TROPONIN I    Imaging Review Dg Chest 2 View  01/31/2015   CLINICAL DATA:  Dizziness.  EXAM: CHEST  2 VIEW  COMPARISON:  Dec 18, 2014.  FINDINGS: The heart size and mediastinal contours are within normal limits. Both lungs are clear. No pneumothorax or pleural effusion is noted. The visualized skeletal  structures are unremarkable.  IMPRESSION: No active cardiopulmonary disease.   Electronically Signed   By: Marijo Conception, M.D.   On: 01/31/2015 12:46     EKG Interpretation   Date/Time:  Sunday January 31 2015 12:00:51 EDT Ventricular Rate:  81 PR Interval:  200 QRS Duration: 132 QT Interval:  422 QTC Calculation: 490 R Axis:   91 Text Interpretation:  Normal sinus rhythm Right bundle branch block  Abnormal ECG No significant change since last tracing Confirmed by RAY MD,  Andee Poles [phone]) on 01/31/2015 12:52:24 PM      MDM   Final diagnoses:  Dizziness  Pt is feeling better after fluids. Ambulatory without any problems. Continued to not having cp or sob. Think pt is okay to follow up as needed. Discussed return precautions     Glendell Docker, NP 01/31/15 1500  Pattricia Boss, MD 02/02/15 1510

## 2015-01-31 NOTE — Discharge Instructions (Signed)

## 2015-01-31 NOTE — ED Notes (Signed)
Vrinda EDNP at bedside.

## 2015-01-31 NOTE — ED Notes (Signed)
Pt d/c home with family- states feels better after IVF- follow care discussed

## 2015-01-31 NOTE — ED Notes (Signed)
EKG complete and given to Dr Jeanell Sparrow to review

## 2015-01-31 NOTE — ED Notes (Signed)
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

## 2015-02-01 ENCOUNTER — Other Ambulatory Visit: Payer: Self-pay | Admitting: Family Medicine

## 2015-02-08 ENCOUNTER — Other Ambulatory Visit (INDEPENDENT_AMBULATORY_CARE_PROVIDER_SITE_OTHER): Payer: 59

## 2015-02-08 ENCOUNTER — Other Ambulatory Visit: Payer: Self-pay | Admitting: Family Medicine

## 2015-02-08 DIAGNOSIS — E669 Obesity, unspecified: Secondary | ICD-10-CM | POA: Diagnosis not present

## 2015-02-08 DIAGNOSIS — E1169 Type 2 diabetes mellitus with other specified complication: Secondary | ICD-10-CM

## 2015-02-08 DIAGNOSIS — E782 Mixed hyperlipidemia: Secondary | ICD-10-CM

## 2015-02-08 DIAGNOSIS — I10 Essential (primary) hypertension: Secondary | ICD-10-CM | POA: Diagnosis not present

## 2015-02-08 DIAGNOSIS — E119 Type 2 diabetes mellitus without complications: Secondary | ICD-10-CM | POA: Diagnosis not present

## 2015-02-08 LAB — COMPREHENSIVE METABOLIC PANEL
ALT: 16 U/L (ref 0–53)
AST: 15 U/L (ref 0–37)
Albumin: 3.8 g/dL (ref 3.5–5.2)
Alkaline Phosphatase: 56 U/L (ref 39–117)
BUN: 9 mg/dL (ref 6–23)
CO2: 26 mEq/L (ref 19–32)
Calcium: 9.2 mg/dL (ref 8.4–10.5)
Chloride: 104 mEq/L (ref 96–112)
Creatinine, Ser: 0.79 mg/dL (ref 0.40–1.50)
GFR: 107.55 mL/min (ref >60.00)
Glucose, Bld: 175 mg/dL — ABNORMAL HIGH (ref 70–99)
POTASSIUM: 3.7 meq/L (ref 3.5–5.1)
SODIUM: 136 meq/L (ref 135–145)
TOTAL PROTEIN: 7 g/dL (ref 6.0–8.3)
Total Bilirubin: 0.5 mg/dL (ref 0.2–1.2)

## 2015-02-08 LAB — LIPID PANEL
CHOLESTEROL: 113 mg/dL (ref 0–200)
HDL: 35.5 mg/dL — ABNORMAL LOW (ref >39.00)
LDL CALC: 61 mg/dL (ref 0–99)
NONHDL: 77.5
Total CHOL/HDL Ratio: 3
Triglycerides: 85 mg/dL (ref 0.0–149.0)
VLDL: 17 mg/dL (ref 0.0–40.0)

## 2015-02-08 LAB — CBC
HCT: 38.2 % — ABNORMAL LOW (ref 39.0–52.0)
Hemoglobin: 13 g/dL (ref 13.0–17.0)
MCHC: 34 g/dL (ref 30.0–36.0)
MCV: 89.2 fl (ref 78.0–100.0)
PLATELETS: 196 10*3/uL (ref 150.0–400.0)
RBC: 4.28 Mil/uL (ref 4.22–5.81)
RDW: 14.2 % (ref 11.5–15.5)
WBC: 6.1 10*3/uL (ref 4.0–10.5)

## 2015-02-08 LAB — TSH: TSH: 1.99 u[IU]/mL (ref 0.35–4.50)

## 2015-02-08 LAB — HEMOGLOBIN A1C: Hgb A1c MFr Bld: 7.8 % — ABNORMAL HIGH (ref 4.6–6.5)

## 2015-02-09 ENCOUNTER — Other Ambulatory Visit: Payer: Self-pay

## 2015-02-11 ENCOUNTER — Ambulatory Visit: Payer: Self-pay | Admitting: Family Medicine

## 2015-02-16 ENCOUNTER — Ambulatory Visit (INDEPENDENT_AMBULATORY_CARE_PROVIDER_SITE_OTHER): Payer: 59 | Admitting: Family Medicine

## 2015-02-16 ENCOUNTER — Encounter: Payer: Self-pay | Admitting: Family Medicine

## 2015-02-16 VITALS — BP 118/74 | HR 100 | Temp 98.7°F | Ht 72.0 in | Wt 192.1 lb

## 2015-02-16 DIAGNOSIS — I10 Essential (primary) hypertension: Secondary | ICD-10-CM | POA: Diagnosis not present

## 2015-02-16 DIAGNOSIS — E1140 Type 2 diabetes mellitus with diabetic neuropathy, unspecified: Secondary | ICD-10-CM

## 2015-02-16 DIAGNOSIS — E785 Hyperlipidemia, unspecified: Secondary | ICD-10-CM

## 2015-02-16 DIAGNOSIS — N200 Calculus of kidney: Secondary | ICD-10-CM

## 2015-02-16 DIAGNOSIS — E1149 Type 2 diabetes mellitus with other diabetic neurological complication: Secondary | ICD-10-CM

## 2015-02-16 MED ORDER — INSULIN GLARGINE 100 UNIT/ML SOLOSTAR PEN: 10.0000 [IU] | PEN_INJECTOR | Freq: Every day | SUBCUTANEOUS | Status: DC

## 2015-02-16 MED ORDER — METHOCARBAMOL 500 MG PO TABS: 500.0000 mg | ORAL_TABLET | Freq: Two times a day (BID) | ORAL | Status: DC | PRN

## 2015-02-16 MED ORDER — OXYCODONE-ACETAMINOPHEN 5-325 MG PO TABS: 1.0000 | ORAL_TABLET | Freq: Three times a day (TID) | ORAL | Status: DC | PRN

## 2015-02-16 NOTE — Patient Instructions (Signed)
Aspercreme with Lidocaine patches  Sciatica Sciatica is pain, weakness, numbness, or tingling along the path of the sciatic nerve. The nerve starts in the lower back and runs down the back of each leg. The nerve controls the muscles in the lower leg and in the back of the knee, while also providing sensation to the back of the thigh, lower leg, and the sole of your foot. Sciatica is a symptom of another medical condition. For instance, nerve damage or certain conditions, such as a herniated disk or bone spur on the spine, pinch or put pressure on the sciatic nerve. This causes the pain, weakness, or other sensations normally associated with sciatica. Generally, sciatica only affects one side of the body. CAUSES   Herniated or slipped disc.  Degenerative disk disease.  A pain disorder involving the narrow muscle in the buttocks (piriformis syndrome).  Pelvic injury or fracture.  Pregnancy.  Tumor (rare). SYMPTOMS  Symptoms can vary from mild to very severe. The symptoms usually travel from the low back to the buttocks and down the back of the leg. Symptoms can include:  Mild tingling or dull aches in the lower back, leg, or hip.  Numbness in the back of the calf or sole of the foot.  Burning sensations in the lower back, leg, or hip.  Sharp pains in the lower back, leg, or hip.  Leg weakness.  Severe back pain inhibiting movement. These symptoms may get worse with coughing, sneezing, laughing, or prolonged sitting or standing. Also, being overweight may worsen symptoms. DIAGNOSIS  Your caregiver will perform a physical exam to look for common symptoms of sciatica. He or she may ask you to do certain movements or activities that would trigger sciatic nerve pain. Other tests may be performed to find the cause of the sciatica. These may include:  Blood tests.  X-rays.  Imaging tests, such as an MRI or CT scan. TREATMENT  Treatment is directed at the cause of the sciatic pain.  Sometimes, treatment is not necessary and the pain and discomfort goes away on its own. If treatment is needed, your caregiver may suggest:  Over-the-counter medicines to relieve pain.  Prescription medicines, such as anti-inflammatory medicine, muscle relaxants, or narcotics.  Applying heat or ice to the painful area.  Steroid injections to lessen pain, irritation, and inflammation around the nerve.  Reducing activity during periods of pain.  Exercising and stretching to strengthen your abdomen and improve flexibility of your spine. Your caregiver may suggest losing weight if the extra weight makes the back pain worse.  Physical therapy.  Surgery to eliminate what is pressing or pinching the nerve, such as a bone spur or part of a herniated disk. HOME CARE INSTRUCTIONS   Only take over-the-counter or prescription medicines for pain or discomfort as directed by your caregiver.  Apply ice to the affected area for 20 minutes, 3-4 times a day for the first 48-72 hours. Then try heat in the same way.  Exercise, stretch, or perform your usual activities if these do not aggravate your pain.  Attend physical therapy sessions as directed by your caregiver.  Keep all follow-up appointments as directed by your caregiver.  Do not wear high heels or shoes that do not provide proper support.  Check your mattress to see if it is too soft. A firm mattress may lessen your pain and discomfort. SEEK IMMEDIATE MEDICAL CARE IF:   You lose control of your bowel or bladder (incontinence).  You have increasing weakness in  the lower back, pelvis, buttocks, or legs.  You have redness or swelling of your back.  You have a burning sensation when you urinate.  You have pain that gets worse when you lie down or awakens you at night.  Your pain is worse than you have experienced in the past.  Your pain is lasting longer than 4 weeks.  You are suddenly losing weight without reason. MAKE SURE  YOU:  Understand these instructions.  Will watch your condition.  Will get help right away if you are not doing well or get worse. Document Released: 07/25/2001 Document Revised: 01/30/2012 Document Reviewed: 12/10/2011 [facility] Patient Information 2015 [place], [place]. This information is not intended to replace advice given to you by your health care provider. Make sure you discuss any questions you have with your health care provider.

## 2015-02-16 NOTE — Progress Notes (Signed)
Albert [name]  [id number] 10/19/1955 02/16/2015      Progress Note-Follow Up  Subjective  Chief Complaint  Chief Complaint  Patient presents with  . Follow-up    HPI  Patient is a 59 y.o. male in today for routine medical care. Patient is in today for follow-up all in all doing much better. Continues to have some intermittent radicular pain down his right leg but otherwise denies any ongoing pain. His abdominal and back pain have improved. No fevers or chills. No polyuria or polydipsia. He believes he has passed his last kidney stone. Denies CP/palp/SOB/HA/congestion/fevers/GI or GU c/o. Taking meds as prescribed  Past Medical History  Diagnosis Date  . Hypertension   . History of chicken pox     childhood  . Seasonal allergies   . History of kidney stones   . 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 Sciatica of right side 12/22/2012  . Insomnia 02/25/2013  . Diverticulosis 10/26/2013  . Colon polyps     adenomatous  . DM (diabetes mellitus), type 2 with neurological complications 2/87/6811  . Hyperlipidemia 07/26/2014  . 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 Preventative health care 07/26/2014    Past Surgical History  Procedure Laterality Date  . Hernia repair    . Abdominal hernia repair    . Rotator cuff repair    . Cholecystectomy    . Mass excision  05/22/2012    Procedure: EXCISION MASS;  Surgeon: Joyice Faster. Cornett, MD;  Location: [facility];  Service: General;  Laterality: Left;  excision abdominal wall mass  . Laparoscopic appendectomy N/A 08/16/2014    Procedure: APPENDECTOMY LAPAROSCOPIC;  Surgeon: Erroll Luna, MD;  Location: [facility] OR;  Service: General;  Laterality: N/A;    Family History  Problem Relation Age of Onset  . Lung cancer Mother   . Breast cancer Neg Hx   . Colon cancer Neg Hx   . Prostate cancer Neg Hx   . Heart disease Neg Hx   . Diabetes Maternal Aunt   . Cancer  Paternal Grandmother     History   Social History  . Marital Status: Married    Spouse Name: N/A  . Number of Children: 3  . Years of Education: N/A   Occupational History  .      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 Smoking status: Never Smoker   . Smokeless tobacco: Never Used  . Alcohol Use: No  . Drug Use: No  . Sexual Activity: Not on file   Other Topics Concern  . Not on file   Social History Narrative    Current Outpatient Prescriptions on File Prior to Visit  Medication Sig Dispense Refill  . aspirin EC 81 MG tablet Take 1 tablet (81 mg total) by mouth daily.    Marland Kitchen atorvastatin (LIPITOR) 40 MG tablet Take 1 tablet (40 mg total) by mouth daily. 90 tablet 1  . Blood Glucose Monitoring Suppl (ONE TOUCH ULTRA SYSTEM KIT) W/DEVICE KIT Use as directed to check blood sugar.  Diagnosis code E11.40    . glucose blood test strip Use as directed twice daily to check blood sugar.  Diagnosis code E11.40    . Insulin Glargine (LANTUS) 100 UNIT/ML Solostar Pen Inject 10 Units into the skin daily. (Patient taking differently: Inject 12 Units into the skin daily. ) 15 mL 6  . JANUMET 50-1000 MG per  tablet TAKE 1 TABLET BY MOUTH 2 TIMES A DAY 60 tablet 3  . lisinopril (PRINIVIL,ZESTRIL) 10 MG tablet TAKE 1 TABLET BY MOUTH 2 TIMES DAILY. 180 tablet 3  . loperamide (IMODIUM A-D) 2 MG tablet Take 2 mg by mouth 4 (four) times daily as needed for diarrhea or loose stools.    . Multiple Vitamins-Minerals (MULTIVITAMIN WITH MINERALS) tablet Take 1 tablet by mouth daily.     No current facility-administered medications on file prior to visit.    Allergies  Allergen Reactions  . Contrast Media [Iodinated Diagnostic Agents] Swelling    Can take if premedicated with diphenhydramine  . Iodine Swelling    Review of Systems  Review of Systems  Constitutional: Negative for fever and malaise/fatigue.  HENT: Negative for congestion.   Eyes: Negative for discharge.    Respiratory: Negative for shortness of breath.   Cardiovascular: Negative for chest pain, palpitations and leg swelling.  Gastrointestinal: Negative for nausea, abdominal pain and diarrhea.  Genitourinary: Negative for dysuria.  Musculoskeletal: Negative for falls.  Skin: Negative for rash.  Neurological: Negative for loss of consciousness and headaches.  Endo/Heme/Allergies: Negative for polydipsia.  Psychiatric/Behavioral: Negative for depression and suicidal ideas. The patient is not nervous/anxious and does not have insomnia.     Objective  BP 118/74 mmHg  Pulse 100  Temp(Src) 98.7 F (37.1 C) (Oral)  Ht 6' (1.829 m)  Wt 192 lb 2 oz (87.147 kg)  BMI 26.05 kg/m2  SpO2 94%  Physical Exam  Physical Exam  Constitutional: He is oriented to person, place, and time and well-developed, well-nourished, and in no distress. No distress.  HENT:  Head: Normocephalic and atraumatic.  Eyes: Conjunctivae are normal.  Neck: Neck supple. No thyromegaly present.  Cardiovascular: Normal rate, regular rhythm and normal heart sounds.   No murmur heard. Pulmonary/Chest: Effort normal and breath sounds normal. No respiratory distress.  Abdominal: He exhibits no distension and no mass. There is no tenderness.  Musculoskeletal: He exhibits no edema.  Neurological: He is alert and oriented to person, place, and time.  Skin: Skin is warm.  Psychiatric: Memory, affect and judgment normal.    Lab Results  Component Value Date   TSH 1.99 02/08/2015   Lab Results  Component Value Date   WBC 6.1 02/08/2015   HGB 13.0 02/08/2015   HCT 38.2* 02/08/2015   MCV 89.2 02/08/2015   PLT 196.0 02/08/2015   Lab Results  Component Value Date   CREATININE 0.79 02/08/2015   BUN 9 02/08/2015   NA 136 02/08/2015   K 3.7 02/08/2015   CL 104 02/08/2015   CO2 26 02/08/2015   Lab Results  Component Value Date   ALT 16 02/08/2015   AST 15 02/08/2015   ALKPHOS 56 02/08/2015   BILITOT 0.5 02/08/2015    Lab Results  Component Value Date   CHOL 113 02/08/2015   Lab Results  Component Value Date   HDL 35.50* 02/08/2015   Lab Results  Component Value Date   LDLCALC 61 02/08/2015   Lab Results  Component Value Date   TRIG 85.0 02/08/2015   Lab Results  Component Value Date   CHOLHDL 3 02/08/2015     Assessment & Plan  DM (diabetes mellitus), type 2 with neurological complications Sugars improving. hgba1c acceptable, minimize simple carbs. Increase exercise as tolerated. Continue current meds  HTN (hypertension) Well controlled, no changes to meds. Encouraged heart healthy diet such as the DASH diet and exercise as tolerated.  Hyperlipidemia Tolerating statin, encouraged heart healthy diet, avoid trans fats, minimize simple carbs and saturated fats. Increase exercise as tolerated  Renal lithiasis He believes he has passed all of his stones and he is no longer having pain, encouraged adequate hydration.

## 2015-02-16 NOTE — Progress Notes (Signed)
Pre visit review using our clinic review tool, if applicable. No additional management support is needed unless otherwise documented below in the visit note. 

## 2015-02-21 ENCOUNTER — Encounter: Payer: Self-pay | Admitting: Family Medicine

## 2015-02-21 DIAGNOSIS — N200 Calculus of kidney: Secondary | ICD-10-CM | POA: Insufficient documentation

## 2015-02-21 HISTORY — DX: Calculus of kidney: N20.0

## 2015-02-21 NOTE — Assessment & Plan Note (Signed)
He believes he has passed all of his stones and he is no longer having pain, encouraged adequate hydration.

## 2015-02-21 NOTE — Assessment & Plan Note (Signed)
Well controlled, no changes to meds. Encouraged heart healthy diet such as the DASH diet and exercise as tolerated.  °

## 2015-02-21 NOTE — Assessment & Plan Note (Signed)
Sugars improving. hgba1c acceptable, minimize simple carbs. Increase exercise as tolerated. Continue current meds

## 2015-02-21 NOTE — Assessment & Plan Note (Signed)
Tolerating statin, encouraged heart healthy diet, avoid trans fats, minimize simple carbs and saturated fats. Increase exercise as tolerated 

## 2015-02-22 ENCOUNTER — Encounter: Payer: Self-pay | Admitting: Family Medicine

## 2015-03-24 ENCOUNTER — Other Ambulatory Visit: Payer: Self-pay | Admitting: Family Medicine

## 2015-03-24 NOTE — Telephone Encounter (Signed)
Caller name: °Relation to pt: °Call back number: °Pharmacy: ° °Reason for call:  ° °

## 2015-03-24 NOTE — Telephone Encounter (Signed)
Caller name: [place] Relation to pt: self Call back number: [phone] ok to leave message Pharmacy: [place], [place]  Reason for call: Pt is requesting rx for oxyCODONE-acetaminophen (ROXICET) 5-325 MG per tablet. Please advise.

## 2015-03-24 NOTE — Telephone Encounter (Signed)
Last office visit 02/16/15 Labs 02/08/15  Medication refilled

## 2015-03-25 ENCOUNTER — Other Ambulatory Visit: Payer: Self-pay | Admitting: Family Medicine

## 2015-03-25 MED ORDER — OXYCODONE-ACETAMINOPHEN 5-325 MG PO TABS: 1.0000 | ORAL_TABLET | Freq: Three times a day (TID) | ORAL | Status: DC | PRN

## 2015-05-13 ENCOUNTER — Other Ambulatory Visit (INDEPENDENT_AMBULATORY_CARE_PROVIDER_SITE_OTHER): Payer: 59

## 2015-05-13 ENCOUNTER — Telehealth: Payer: Self-pay | Admitting: Family Medicine

## 2015-05-13 DIAGNOSIS — E785 Hyperlipidemia, unspecified: Secondary | ICD-10-CM | POA: Diagnosis not present

## 2015-05-13 DIAGNOSIS — I10 Essential (primary) hypertension: Secondary | ICD-10-CM | POA: Diagnosis not present

## 2015-05-13 DIAGNOSIS — E1149 Type 2 diabetes mellitus with other diabetic neurological complication: Secondary | ICD-10-CM

## 2015-05-13 DIAGNOSIS — E1140 Type 2 diabetes mellitus with diabetic neuropathy, unspecified: Secondary | ICD-10-CM

## 2015-05-13 LAB — COMPREHENSIVE METABOLIC PANEL
ALT: 16 U/L (ref 0–53)
AST: 12 U/L (ref 0–37)
Albumin: 3.8 g/dL (ref 3.5–5.2)
Alkaline Phosphatase: 66 U/L (ref 39–117)
BUN: 13 mg/dL (ref 6–23)
CALCIUM: 8.9 mg/dL (ref 8.4–10.5)
CHLORIDE: 105 meq/L (ref 96–112)
CO2: 26 mEq/L (ref 19–32)
CREATININE: 0.79 mg/dL (ref 0.40–1.50)
GFR: 107.45 mL/min (ref >60.00)
Glucose, Bld: 185 mg/dL — ABNORMAL HIGH (ref 70–99)
Potassium: 3.8 mEq/L (ref 3.5–5.1)
Sodium: 139 mEq/L (ref 135–145)
Total Bilirubin: 0.4 mg/dL (ref 0.2–1.2)
Total Protein: 7.2 g/dL (ref 6.0–8.3)

## 2015-05-13 LAB — LIPID PANEL
CHOLESTEROL: 100 mg/dL (ref 0–200)
HDL: 29.5 mg/dL — ABNORMAL LOW (ref >39.00)
LDL Cholesterol: 50 mg/dL (ref 0–99)
NonHDL: 70.75
TRIGLYCERIDES: 103 mg/dL (ref 0.0–149.0)
Total CHOL/HDL Ratio: 3
VLDL: 20.6 mg/dL (ref 0.0–40.0)

## 2015-05-13 LAB — CBC
HCT: 39.9 % (ref 39.0–52.0)
HEMOGLOBIN: 13.3 g/dL (ref 13.0–17.0)
MCHC: 33.4 g/dL (ref 30.0–36.0)
MCV: 90.3 fl (ref 78.0–100.0)
PLATELETS: 208 10*3/uL (ref 150.0–400.0)
RBC: 4.42 Mil/uL (ref 4.22–5.81)
RDW: 14.1 % (ref 11.5–15.5)
WBC: 6.1 10*3/uL (ref 4.0–10.5)

## 2015-05-13 LAB — HEMOGLOBIN A1C: Hgb A1c MFr Bld: 8 % — ABNORMAL HIGH (ref 4.6–6.5)

## 2015-05-13 LAB — TSH: TSH: 1.34 u[IU]/mL (ref 0.35–4.50)

## 2015-05-13 NOTE — Telephone Encounter (Signed)
9/29 KO lm to reschedule (see tel notes)  Pt is scheduled for 05/20/15 at 2:00pm. I need to switch him to 3:00pm appt same day and move the 3:00pm pt to 2:00pm if he calls back.

## 2015-05-20 ENCOUNTER — Encounter: Payer: Self-pay | Admitting: Family Medicine

## 2015-05-20 ENCOUNTER — Ambulatory Visit (INDEPENDENT_AMBULATORY_CARE_PROVIDER_SITE_OTHER): Payer: 59 | Admitting: Family Medicine

## 2015-05-20 VITALS — BP 114/70 | HR 78 | Temp 98.2°F | Ht 72.0 in | Wt 193.5 lb

## 2015-05-20 DIAGNOSIS — M542 Cervicalgia: Secondary | ICD-10-CM

## 2015-05-20 DIAGNOSIS — E1149 Type 2 diabetes mellitus with other diabetic neurological complication: Secondary | ICD-10-CM | POA: Diagnosis not present

## 2015-05-20 DIAGNOSIS — I10 Essential (primary) hypertension: Secondary | ICD-10-CM

## 2015-05-20 DIAGNOSIS — E785 Hyperlipidemia, unspecified: Secondary | ICD-10-CM | POA: Diagnosis not present

## 2015-05-20 DIAGNOSIS — G609 Hereditary and idiopathic neuropathy, unspecified: Secondary | ICD-10-CM

## 2015-05-20 DIAGNOSIS — E119 Type 2 diabetes mellitus without complications: Secondary | ICD-10-CM | POA: Diagnosis not present

## 2015-05-20 DIAGNOSIS — E1169 Type 2 diabetes mellitus with other specified complication: Secondary | ICD-10-CM

## 2015-05-20 DIAGNOSIS — E669 Obesity, unspecified: Secondary | ICD-10-CM

## 2015-05-20 MED ORDER — SITAGLIPTIN PHOS-METFORMIN HCL 50-1000 MG PO TABS: 1.0000 | ORAL_TABLET | Freq: Two times a day (BID) | ORAL | Status: DC

## 2015-05-20 MED ORDER — METHOCARBAMOL 500 MG PO TABS: 500.0000 mg | ORAL_TABLET | Freq: Two times a day (BID) | ORAL | Status: DC | PRN

## 2015-05-20 MED ORDER — INSULIN GLARGINE 100 UNIT/ML SOLOSTAR PEN: 12.0000 [IU] | PEN_INJECTOR | Freq: Every day | SUBCUTANEOUS | Status: DC

## 2015-05-20 MED ORDER — OXYCODONE-ACETAMINOPHEN 5-325 MG PO TABS: 1.0000 | ORAL_TABLET | Freq: Three times a day (TID) | ORAL | Status: DC | PRN

## 2015-05-20 NOTE — Progress Notes (Signed)
Pre visit review using our clinic review tool, if applicable. No additional management support is needed unless otherwise documented below in the visit note. 

## 2015-05-20 NOTE — Patient Instructions (Addendum)
NOW probiotic 10 strain version, luckyvitamins.com, [employer] Carbohydrate Counting for Diabetes Mellitus Carbohydrate counting is a method for keeping track of the amount of carbohydrates you eat. Eating carbohydrates naturally increases the level of sugar (glucose) in your blood, so it is important for you to know the amount that is okay for you to have in every meal. Carbohydrate counting helps keep the level of glucose in your blood within normal limits. The amount of carbohydrates allowed is different for every person. A dietitian can help you calculate the amount that is right for you. Once you know the amount of carbohydrates you can have, you can count the carbohydrates in the foods you want to eat. Carbohydrates are found in the following foods:  Grains, such as breads and cereals.  Dried beans and soy products.  Starchy vegetables, such as potatoes, peas, and corn.  Fruit and fruit juices.  Milk and yogurt.  Sweets and snack foods, such as cake, cookies, candy, chips, soft drinks, and fruit drinks. CARBOHYDRATE COUNTING There are two ways to count the carbohydrates in your food. You can use either of the methods or a combination of both. Reading the "Nutrition Facts" on [place] The "Nutrition Facts" is an area that is included on the labels of almost all packaged food and beverages in the [place]. It includes the serving size of that food or beverage and information about the nutrients in each serving of the food, including the grams (g) of carbohydrate per serving.  Decide the number of servings of this food or beverage that you will be able to eat or drink. Multiply that number of servings by the number of grams of carbohydrate that is listed on the label for that serving. The total will be the amount of carbohydrates you will be having when you eat or drink this food or beverage. Learning Standard Serving Sizes of Food When you eat food that is not packaged or  does not include "Nutrition Facts" on the label, you need to measure the servings in order to count the amount of carbohydrates.A serving of most carbohydrate-rich foods contains about 15 g of carbohydrates. The following list includes serving sizes of carbohydrate-rich foods that provide 15 g ofcarbohydrate per serving:   1 slice of bread (1 oz) or 1 six-inch tortilla.    of a hamburger bun or English muffin.  4-6 crackers.   cup unsweetened dry cereal.    cup hot cereal.   cup rice or pasta.    cup mashed potatoes or  of a large baked potato.  1 cup fresh fruit or one small piece of fruit.    cup canned or frozen fruit or fruit juice.  1 cup milk.   cup plain fat-free yogurt or yogurt sweetened with artificial sweeteners.   cup cooked dried beans or starchy vegetable, such as peas, corn, or potatoes.  Decide the number of standard-size servings that you will eat. Multiply that number of servings by 15 (the grams of carbohydrates in that serving). For example, if you eat 2 cups of strawberries, you will have eaten 2 servings and 30 g of carbohydrates (2 servings x 15 g = 30 g). For foods such as soups and casseroles, in which more than one food is mixed in, you will need to count the carbohydrates in each food that is included. EXAMPLE OF CARBOHYDRATE COUNTING Sample Dinner  3 oz chicken breast.   cup of brown rice.   cup of corn.  1 cup milk.   1 cup strawberries with sugar-free whipped topping.  Carbohydrate Calculation Step 1: Identify the foods that contain carbohydrates:   Rice.   Corn.   Milk.   Strawberries. Step 2:Calculate the number of servings eaten of each:   2 servings of rice.   1 serving of corn.   1 serving of milk.   1 serving of strawberries. Step 3: Multiply each of those number of servings by 15 g:   2 servings of rice x 15 g = 30 g.   1 serving of corn x 15 g = 15 g.   1 serving of milk x 15 g = 15 g.    1 serving of strawberries x 15 g = 15 g. Step 4: Add together all of the amounts to find the total grams of carbohydrates eaten: 30 g + 15 g + 15 g + 15 g = 75 g.   This information is not intended to replace advice given to you by your health care provider. Make sure you discuss any questions you have with your health care provider.   Document Released: 07/31/2005 Document Revised: 08/21/2014 Document Reviewed: 06/27/2013 Elsevier Interactive Patient Education 2016 [employer]. Diarrhea Diarrhea is frequent loose and watery bowel movements. It can cause you to feel weak and dehydrated. Dehydration can cause you to become tired and thirsty, have a dry mouth, and have decreased urination that often is dark yellow. Diarrhea is a sign of another problem, most often an infection that will not last long. In most cases, diarrhea typically lasts 2-3 days. However, it can last longer if it is a sign of something more serious. It is important to treat your diarrhea as directed by your caregiver to lessen or prevent future episodes of diarrhea. CAUSES  Some common causes include:  Gastrointestinal infections caused by viruses, bacteria, or parasites.  Food poisoning or food allergies.  Certain medicines, such as antibiotics, chemotherapy, and laxatives.  Artificial sweeteners and fructose.  Digestive disorders. HOME CARE INSTRUCTIONS  Ensure adequate fluid intake (hydration): Have 1 cup (8 oz) of fluid for each diarrhea episode. Avoid fluids that contain simple sugars or sports drinks, fruit juices, whole milk products, and sodas. Your urine should be clear or pale yellow if you are drinking enough fluids. Hydrate with an oral rehydration solution that you can purchase at pharmacies, retail stores, and online. You can prepare an oral rehydration solution at home by mixing the following ingredients together:   - tsp table salt.   tsp baking soda.   tsp salt substitute containing potassium  chloride.  1  tablespoons sugar.  1 L (34 oz) of water.  Certain foods and beverages may increase the speed at which food moves through the gastrointestinal (GI) tract. These foods and beverages should be avoided and include:  Caffeinated and alcoholic beverages.  High-fiber foods, such as raw fruits and vegetables, nuts, seeds, and whole grain breads and cereals.  Foods and beverages sweetened with sugar alcohols, such as xylitol, sorbitol, and mannitol.  Some foods may be well tolerated and may help thicken stool including:  Starchy foods, such as rice, toast, pasta, low-sugar cereal, oatmeal, grits, baked potatoes, crackers, and bagels.  Bananas.  Applesauce.  Add probiotic-rich foods to help increase healthy bacteria in the GI tract, such as yogurt and fermented milk products.  Wash your hands well after each diarrhea episode.  Only take over-the-counter or prescription medicines as directed by your caregiver.  Take a warm bath  to relieve any burning or pain from frequent diarrhea episodes. SEEK IMMEDIATE MEDICAL CARE IF:   You are unable to keep fluids down.  You have persistent vomiting.  You have blood in your stool, or your stools are black and tarry.  You do not urinate in 6-8 hours, or there is only a small amount of very dark urine.  You have abdominal pain that increases or localizes.  You have weakness, dizziness, confusion, or light-headedness.  You have a severe headache.  Your diarrhea gets worse or does not get better.  You have a fever or persistent symptoms for more than 2-3 days.  You have a fever and your symptoms suddenly get worse. MAKE SURE YOU:   Understand these instructions.  Will watch your condition.  Will get help right away if you are not doing well or get worse.   This information is not intended to replace advice given to you by your health care provider. Make sure you discuss any questions you have with your health care  provider.   Document Released: 07/21/2002 Document Revised: 08/21/2014 Document Reviewed: 04/07/2012 Elsevier Interactive Patient Education 2016 Elsevier Inc. Viral Gastroenteritis Viral gastroenteritis is also known as stomach flu. This condition affects the stomach and intestinal tract. It can cause sudden diarrhea and vomiting. The illness typically lasts 3 to 8 days. Most people develop an immune response that eventually gets rid of the virus. While this natural response develops, the virus can make you quite ill. CAUSES  Many different viruses can cause gastroenteritis, such as rotavirus or noroviruses. You can catch one of these viruses by consuming contaminated food or water. You may also catch a virus by sharing utensils or other personal items with an infected person or by touching a contaminated surface. SYMPTOMS  The most common symptoms are diarrhea and vomiting. These problems can cause a severe loss of body fluids (dehydration) and a body salt (electrolyte) imbalance. Other symptoms may include:  Fever.  Headache.  Fatigue.  Abdominal pain. DIAGNOSIS  Your caregiver can usually diagnose viral gastroenteritis based on your symptoms and a physical exam. A stool sample may also be taken to test for the presence of viruses or other infections. TREATMENT  This illness typically goes away on its own. Treatments are aimed at rehydration. The most serious cases of viral gastroenteritis involve vomiting so severely that you are not able to keep fluids down. In these cases, fluids must be given through an intravenous line (IV). HOME CARE INSTRUCTIONS   Drink enough fluids to keep your urine clear or pale yellow. Drink small amounts of fluids frequently and increase the amounts as tolerated.  Ask your caregiver for specific rehydration instructions.  Avoid:  Foods high in sugar.  Alcohol.  Carbonated drinks.  Tobacco.  Juice.  Caffeine drinks.  Extremely hot or cold  fluids.  Fatty, greasy foods.  Too much intake of anything at one time.  Dairy products until 24 to 48 hours after diarrhea stops.  You may consume probiotics. Probiotics are active cultures of beneficial bacteria. They may lessen the amount and number of diarrheal stools in adults. Probiotics can be found in yogurt with active cultures and in supplements.  Wash your hands well to avoid spreading the virus.  Only take over-the-counter or prescription medicines for pain, discomfort, or fever as directed by your caregiver. Do not give aspirin to children. Antidiarrheal medicines are not recommended.  Ask your caregiver if you should continue to take your regular prescribed and over-the-counter  medicines.  Keep all follow-up appointments as directed by your caregiver. SEEK IMMEDIATE MEDICAL CARE IF:   You are unable to keep fluids down.  You do not urinate at least once every 6 to 8 hours.  You develop shortness of breath.  You notice blood in your stool or vomit. This may look like coffee grounds.  You have abdominal pain that increases or is concentrated in one small area (localized).  You have persistent vomiting or diarrhea.  You have a fever.  The patient is a child younger than 3 months, and he or she has a fever.  The patient is a child older than 3 months, and he or she has a fever and persistent symptoms.  The patient is a child older than 3 months, and he or she has a fever and symptoms suddenly get worse.  The patient is a baby, and he or she has no tears when crying. MAKE SURE YOU:   Understand these instructions.  Will watch your condition.  Will get help right away if you are not doing well or get worse.   This information is not intended to replace advice given to you by your health care provider. Make sure you discuss any questions you have with your health care provider.   Document Released: 07/31/2005 Document Revised: 10/23/2011 Document Reviewed:  05/17/2011 Elsevier Interactive Patient Education [employer].

## 2015-05-23 NOTE — Assessment & Plan Note (Signed)
hgba1c acceptable, minimize simple carbs. Increase exercise as tolerated. Continue current meds 

## 2015-05-23 NOTE — Assessment & Plan Note (Signed)
Tolerating statin, encouraged heart healthy diet, avoid trans fats, minimize simple carbs and saturated fats. Increase exercise as tolerated 

## 2015-05-23 NOTE — Assessment & Plan Note (Signed)
Well controlled, no changes to meds. Encouraged heart healthy diet such as the DASH diet and exercise as tolerated.  °

## 2015-05-24 ENCOUNTER — Encounter: Payer: Self-pay | Admitting: Family Medicine

## 2015-05-24 NOTE — Assessment & Plan Note (Signed)
Encouraged moist heat and gentle stretching as tolerated. May try NSAIDs and prescription meds as directed and report if symptoms worsen or seek immediate care. Allowed refill on pain meds today

## 2015-05-24 NOTE — Progress Notes (Signed)
Subjective:    [name], male    DOB: Aug 28, 1955, 59 y.o.   MRN: [id number]  Chief Complaint  Patient presents with  . Follow-up    HPI Patient is in today for follow-up. Reports blood sugars have been improved but unfortunately he's had some low numbers in the 80s that left him feeling shaky and weak. No syncope or presyncope but he has had to eat and rest to get his sugars back up. 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 No recent illness or new injury. Continues to struggle with chronic pain. Denies CP/palp/SOB/HA/congestion/fevers/GI or GU c/o. Taking meds as prescribed  Past Medical History  Diagnosis Date  . Hypertension   . History of chicken pox     childhood  . Seasonal allergies   . History of kidney stones   . 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 Sciatica of right side 12/22/2012  . Insomnia 02/25/2013  . Diverticulosis 10/26/2013  . Colon polyps     adenomatous  . DM (diabetes mellitus), type 2 with neurological complications ([place]) 3/66/2947  . Hyperlipidemia 07/26/2014  . 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 Preventative health care 07/26/2014  . Renal lithiasis 02/21/2015    Past Surgical History  Procedure Laterality Date  . Hernia repair    . Abdominal hernia repair    . Rotator cuff repair    . Cholecystectomy    . Mass excision  05/22/2012    Procedure: EXCISION MASS;  Surgeon: Joyice Faster. Cornett, MD;  Location: [facility];  Service: General;  Laterality: Left;  excision abdominal wall mass  . Laparoscopic appendectomy N/A 08/16/2014    Procedure: APPENDECTOMY LAPAROSCOPIC;  Surgeon: Erroll Luna, MD;  Location: [facility] OR;  Service: General;  Laterality: N/A;    Family History  Problem Relation Age of Onset  . Lung cancer Mother   . Breast cancer Neg Hx   . Colon cancer Neg Hx   . Prostate cancer Neg Hx   . Heart  disease Neg Hx   . Diabetes Maternal Aunt   . Cancer Paternal Grandmother     Social History   Social History  . Marital Status: Married    Spouse Name: N/A  . Number of Children: 3  . Years of Education: N/A   Occupational History  .      Security officer [employer]   Social History Main Topics  . Smoking status: Never Smoker   . Smokeless tobacco: Never Used  . Alcohol Use: No  . Drug Use: No  . Sexual Activity: Not on file   Other Topics Concern  . Not on file   Social History Narrative    Outpatient Prescriptions Prior to Visit  Medication Sig Dispense Refill  . aspirin EC 81 MG tablet Take 1 tablet (81 mg total) by mouth daily.    Marland Kitchen atorvastatin (LIPITOR) 40 MG tablet TAKE 1 TABLET BY MOUTH DAILY 90 tablet 1  . Blood Glucose Monitoring Suppl (ONE TOUCH ULTRA SYSTEM KIT) W/DEVICE KIT Use as directed to check blood sugar.  Diagnosis code E11.40    . glucose blood test strip Use as directed twice daily to check blood sugar.  Diagnosis code E11.40    . lisinopril (PRINIVIL,ZESTRIL) 10 MG tablet TAKE 1 TABLET BY MOUTH 2 TIMES DAILY. 180 tablet 3  . loperamide (IMODIUM A-D) 2 MG tablet  Take 2 mg by mouth 4 (four) times daily as needed for diarrhea or loose stools.    . Multiple Vitamins-Minerals (MULTIVITAMIN WITH MINERALS) tablet Take 1 tablet by mouth daily.    . Insulin Glargine (LANTUS) 100 UNIT/ML Solostar Pen Inject 10 Units into the skin daily. 15 mL 6  . JANUMET 50-1000 MG per tablet TAKE 1 TABLET BY MOUTH 2 TIMES A DAY 60 tablet 3  . methocarbamol (ROBAXIN) 500 MG tablet Take 1 tablet (500 mg total) by mouth 2 (two) times daily as needed for muscle spasms. 60 tablet 1  . oxyCODONE-acetaminophen (ROXICET) 5-325 MG per tablet Take 1-2 tablets by mouth every 8 (eight) hours as needed for severe pain. 40 tablet 0   No facility-administered medications prior to visit.    Allergies  Allergen Reactions  . Contrast Media [Iodinated Diagnostic Agents] Swelling    Can take if  premedicated with diphenhydramine  . Iodine Swelling    Review of Systems  Constitutional: Negative for fever and malaise/fatigue.  HENT: Negative for congestion.   Eyes: Negative for discharge.  Respiratory: Negative for shortness of breath.   Cardiovascular: Negative for chest pain, palpitations and leg swelling.  Gastrointestinal: Negative for nausea and abdominal pain.  Genitourinary: Negative for dysuria.  Musculoskeletal: Positive for back pain, joint pain and neck pain. Negative for falls.  Skin: Negative for rash.  Neurological: Negative for loss of consciousness and headaches.  Endo/Heme/Allergies: Negative for environmental allergies.  Psychiatric/Behavioral: Negative for depression. The patient is not nervous/anxious.        Objective:    Physical Exam  Constitutional: He is oriented to person, place, and time. He appears well-developed and well-nourished. No distress.  HENT:  Head: Normocephalic and atraumatic.  Nose: Nose normal.  Eyes: Right eye exhibits no discharge. Left eye exhibits no discharge.  Neck: Normal range of motion. Neck supple.  Cardiovascular: Normal rate and regular rhythm.   No murmur heard. Pulmonary/Chest: Effort normal and breath sounds normal.  Abdominal: Soft. Bowel sounds are normal. There is no tenderness.  Musculoskeletal: He exhibits no edema.  Neurological: He is alert and oriented to person, place, and time.  Skin: Skin is warm and dry.  Psychiatric: He has a normal mood and affect.  Nursing note and vitals reviewed.   BP 114/70 mmHg  Pulse 78  Temp(Src) 98.2 F (36.8 C) (Oral)  Ht 6' (1.829 m)  Wt 193 lb 8 oz (87.771 kg)  BMI 26.24 kg/m2  SpO2 96% Wt Readings from Last 3 Encounters:  05/20/15 193 lb 8 oz (87.771 kg)  02/16/15 192 lb 2 oz (87.147 kg)  01/31/15 189 lb (85.73 kg)     Lab Results  Component Value Date   WBC 6.1 05/13/2015   HGB 13.3 05/13/2015   HCT 39.9 05/13/2015   PLT 208.0 05/13/2015   GLUCOSE  185* 05/13/2015   CHOL 100 05/13/2015   TRIG 103.0 05/13/2015   HDL 29.50* 05/13/2015   LDLDIRECT 109.0 10/26/2014   LDLCALC 50 05/13/2015   ALT 16 05/13/2015   AST 12 05/13/2015   NA 139 05/13/2015   K 3.8 05/13/2015   CL 105 05/13/2015   CREATININE 0.79 05/13/2015   BUN 13 05/13/2015   CO2 26 05/13/2015   TSH 1.34 05/13/2015   PSA 0.64 09/12/2013   INR 1.10 08/16/2014   HGBA1C 8.0* 05/13/2015   MICROALBUR 2.59* 03/15/2012    Lab Results  Component Value Date   TSH 1.34 05/13/2015   Lab Results  Component  Value Date   WBC 6.1 05/13/2015   HGB 13.3 05/13/2015   HCT 39.9 05/13/2015   MCV 90.3 05/13/2015   PLT 208.0 05/13/2015   Lab Results  Component Value Date   NA 139 05/13/2015   K 3.8 05/13/2015   CO2 26 05/13/2015   GLUCOSE 185* 05/13/2015   BUN 13 05/13/2015   CREATININE 0.79 05/13/2015   BILITOT 0.4 05/13/2015   ALKPHOS 66 05/13/2015   AST 12 05/13/2015   ALT 16 05/13/2015   PROT 7.2 05/13/2015   ALBUMIN 3.8 05/13/2015   CALCIUM 8.9 05/13/2015   ANIONGAP 11 01/31/2015   GFR 107.45 05/13/2015   Lab Results  Component Value Date   CHOL 100 05/13/2015   Lab Results  Component Value Date   HDL 29.50* 05/13/2015   Lab Results  Component Value Date   LDLCALC 50 05/13/2015   Lab Results  Component Value Date   TRIG 103.0 05/13/2015   Lab Results  Component Value Date   CHOLHDL 3 05/13/2015   Lab Results  Component Value Date   HGBA1C 8.0* 05/13/2015       Assessment & Plan:   Problem List Items Addressed This Visit    Hyperlipidemia    Tolerating statin, encouraged heart healthy diet, avoid trans fats, minimize simple carbs and saturated fats. Increase exercise as tolerated      Relevant Orders   TSH   CBC   Hemoglobin A1c   Lipid panel   Comprehensive metabolic panel   HTN (hypertension)    Well controlled, no changes to meds. Encouraged heart healthy diet such as the DASH diet and exercise as tolerated.       Relevant  Orders   TSH   CBC   Hemoglobin A1c   Lipid panel   Comprehensive metabolic panel   Hereditary and idiopathic peripheral neuropathy   Relevant Medications   methocarbamol (ROBAXIN) 500 MG tablet   DM (diabetes mellitus), type 2 with neurological complications ([facility])    MCNO7S acceptable, minimize simple carbs. Increase exercise as tolerated. Continue current meds      Relevant Medications   Insulin Glargine (LANTUS) 100 UNIT/ML Solostar Pen   sitaGLIPtin-metformin (JANUMET) 50-1000 MG tablet   Other Relevant Orders   TSH   CBC   Hemoglobin A1c   Lipid panel   Comprehensive metabolic panel   Acute neck pain    Encouraged moist heat and gentle stretching as tolerated. May try NSAIDs and prescription meds as directed and report if symptoms worsen or seek immediate care. Allowed refill on pain meds today       Other Visit Diagnoses    Diabetes mellitus type 2 in obese ([place])    -  Primary    Relevant Medications    Insulin Glargine (LANTUS) 100 UNIT/ML Solostar Pen    sitaGLIPtin-metformin (JANUMET) 50-1000 MG tablet    Other Relevant Orders    TSH    CBC    Hemoglobin A1c    Lipid panel    Comprehensive metabolic panel       I have changed [name] to sitaGLIPtin-metformin. I have also changed his Insulin Glargine and oxyCODONE-acetaminophen. I am also having him maintain his loperamide, aspirin EC, ONE TOUCH ULTRA SYSTEM KIT, glucose blood, multivitamin with minerals, lisinopril, atorvastatin, and methocarbamol.  Meds ordered this encounter  Medications  . Insulin Glargine (LANTUS) 100 UNIT/ML Solostar Pen    Sig: Inject 12 Units into the skin daily.    Dispense:  15 mL  Refill:  6    DISCONTINUE ALL PREVIOUS REFILLS FOR THIS MEDICATION  . oxyCODONE-acetaminophen (ROXICET) 5-325 MG tablet    Sig: Take 1-2 tablets by mouth every 8 (eight) hours as needed for severe pain.    Dispense:  40 tablet    Refill:  0  . sitaGLIPtin-metformin (JANUMET) 50-1000 MG  tablet    Sig: Take 1 tablet by mouth 2 (two) times daily.    Dispense:  60 tablet    Refill:  3  . methocarbamol (ROBAXIN) 500 MG tablet    Sig: Take 1 tablet (500 mg total) by mouth 2 (two) times daily as needed for muscle spasms.    Dispense:  60 tablet    Refill:  1     [name], MD

## 2015-06-03 ENCOUNTER — Emergency Department (HOSPITAL_COMMUNITY)
Admission: EM | Admit: 2015-06-03 | Discharge: 2015-06-03 | Disposition: A | Discharge status: Home / Self Care | Payer: PRIVATE HEALTH INSURANCE | Attending: Emergency Medicine | Admitting: Emergency Medicine

## 2015-06-03 ENCOUNTER — Encounter (HOSPITAL_COMMUNITY): Payer: Self-pay | Admitting: Emergency Medicine

## 2015-06-03 DIAGNOSIS — I10 Essential (primary) hypertension: Secondary | ICD-10-CM | POA: Insufficient documentation

## 2015-06-03 DIAGNOSIS — X58XXXA Exposure to other specified factors, initial encounter: Secondary | ICD-10-CM | POA: Insufficient documentation

## 2015-06-03 DIAGNOSIS — Z79899 Other long term (current) drug therapy: Secondary | ICD-10-CM | POA: Diagnosis not present

## 2015-06-03 DIAGNOSIS — Z791 Long term (current) use of non-steroidal anti-inflammatories (NSAID): Secondary | ICD-10-CM | POA: Insufficient documentation

## 2015-06-03 DIAGNOSIS — Z7984 Long term (current) use of oral hypoglycemic drugs: Secondary | ICD-10-CM | POA: Insufficient documentation

## 2015-06-03 DIAGNOSIS — E785 Hyperlipidemia, unspecified: Secondary | ICD-10-CM | POA: Diagnosis not present

## 2015-06-03 DIAGNOSIS — Z7982 Long term (current) use of aspirin: Secondary | ICD-10-CM | POA: Insufficient documentation

## 2015-06-03 DIAGNOSIS — S199XXA Unspecified injury of neck, initial encounter: Secondary | ICD-10-CM | POA: Diagnosis not present

## 2015-06-03 DIAGNOSIS — Z87442 Personal history of urinary calculi: Secondary | ICD-10-CM | POA: Insufficient documentation

## 2015-06-03 DIAGNOSIS — Y9289 Other specified places as the place of occurrence of the external cause: Secondary | ICD-10-CM | POA: Insufficient documentation

## 2015-06-03 DIAGNOSIS — Y990 Civilian activity done for income or pay: Secondary | ICD-10-CM | POA: Diagnosis not present

## 2015-06-03 DIAGNOSIS — Z8619 Personal history of other infectious and parasitic diseases: Secondary | ICD-10-CM | POA: Insufficient documentation

## 2015-06-03 DIAGNOSIS — Z794 Long term (current) use of insulin: Secondary | ICD-10-CM | POA: Diagnosis not present

## 2015-06-03 DIAGNOSIS — E1149 Type 2 diabetes mellitus with other diabetic neurological complication: Secondary | ICD-10-CM | POA: Insufficient documentation

## 2015-06-03 DIAGNOSIS — Z8719 Personal history of other diseases of the digestive system: Secondary | ICD-10-CM | POA: Insufficient documentation

## 2015-06-03 DIAGNOSIS — M542 Cervicalgia: Secondary | ICD-10-CM

## 2015-06-03 DIAGNOSIS — Y9389 Activity, other specified: Secondary | ICD-10-CM | POA: Insufficient documentation

## 2015-06-03 DIAGNOSIS — Z8669 Personal history of other diseases of the nervous system and sense organs: Secondary | ICD-10-CM | POA: Diagnosis not present

## 2015-06-03 DIAGNOSIS — Z85828 Personal history of other malignant neoplasm of skin: Secondary | ICD-10-CM | POA: Insufficient documentation

## 2015-06-03 MED ORDER — IBUPROFEN 800 MG PO TABS: 800.0000 mg | ORAL_TABLET | Freq: Three times a day (TID) | ORAL | Status: DC

## 2015-06-03 MED ORDER — METHOCARBAMOL 500 MG PO TABS: 500.0000 mg | ORAL_TABLET | Freq: Two times a day (BID) | ORAL | Status: DC

## 2015-06-03 MED ORDER — OXYCODONE-ACETAMINOPHEN 5-325 MG PO TABS
1.0000 | ORAL_TABLET | Freq: Once | ORAL | Status: AC
  Administered 2015-06-03: 1 via ORAL
  Filled 2015-06-03: qty 1

## 2015-06-03 NOTE — ED Notes (Signed)
sts he "popped his left side of neck" while helping an employee with a car, is [employer], will file workers comp

## 2015-06-03 NOTE — ED Notes (Addendum)
Patient is alert and orientedx4.  Patient was explained discharge instructions and they understood them with no questions.  The patient said he was not going to drive that he was going to sit in the office.  I advised him he should not be involved in anything that would require him to make any sound decisions since he was given percocet in triage.  He understood.  The patient's wife, Demetry Bendickson is taking the patient home at 1800.

## 2015-06-03 NOTE — Discharge Instructions (Signed)
You have been seen today for a neck strain while at work. You have been given ibuprofen and robaxin, a muscle relaxer. Follow up with PCP as needed. Return to ED should you have numbness/tingling/weakness, or any other serious concerns.     Cervical Strain and Sprain With Rehab Cervical strain and sprain are injuries that commonly occur with "whiplash" injuries. Whiplash occurs when the neck is forcefully whipped backward or forward, such as during a motor vehicle accident or during contact sports. The muscles, ligaments, tendons, discs, and nerves of the neck are susceptible to injury when this occurs. RISK FACTORS Risk of having a whiplash injury increases if:  Osteoarthritis of the spine.  Situations that make head or neck accidents or trauma more likely.  High-risk sports (football, rugby, wrestling, hockey, auto racing, gymnastics, diving, contact karate, or boxing).  Poor strength and flexibility of the neck.  Previous neck injury.  Poor tackling technique.  Improperly fitted or padded equipment. SYMPTOMS   Pain or stiffness in the front or back of neck or both.  Symptoms may present immediately or up to 24 hours after injury.  Dizziness, headache, nausea, and vomiting.  Muscle spasm with soreness and stiffness in the neck.  Tenderness and swelling at the injury site. PREVENTION  Learn and use proper technique (avoid tackling with the head, spearing, and head-butting; use proper falling techniques to avoid landing on the head).  Warm up and stretch properly before activity.  Maintain physical fitness:  Strength, flexibility, and endurance.  Cardiovascular fitness.  Wear properly fitted and padded protective equipment, such as padded soft collars, for participation in contact sports. PROGNOSIS  Recovery from cervical strain and sprain injuries is dependent on the extent of the injury. These injuries are usually curable in 1 week to 3 months with appropriate  treatment.  RELATED COMPLICATIONS   Temporary numbness and weakness may occur if the nerve roots are damaged, and this may persist until the nerve has completely healed.  Chronic pain due to frequent recurrence of symptoms.  Prolonged healing, especially if activity is resumed too soon (before complete recovery). TREATMENT  Treatment initially involves the use of ice and medication to help reduce pain and inflammation. It is also important to perform strengthening and stretching exercises and modify activities that worsen symptoms so the injury does not get worse. These exercises may be performed at home or with a therapist. For patients who experience severe symptoms, a soft, padded collar may be recommended to be worn around the neck.  Improving your posture may help reduce symptoms. Posture improvement includes pulling your chin and abdomen in while sitting or standing. If you are sitting, sit in a firm chair with your buttocks against the back of the chair. While sleeping, try replacing your pillow with a small towel rolled to 2 inches in diameter, or use a cervical pillow or soft cervical collar. Poor sleeping positions delay healing.  For patients with nerve root damage, which causes numbness or weakness, the use of a cervical traction apparatus may be recommended. Surgery is rarely necessary for these injuries. However, cervical strain and sprains that are present at birth (congenital) may require surgery. MEDICATION   If pain medication is necessary, nonsteroidal anti-inflammatory medications, such as aspirin and ibuprofen, or other minor pain relievers, such as acetaminophen, are often recommended.  Do not take pain medication for 7 days before surgery.  Prescription pain relievers may be given if deemed necessary by your caregiver. Use only as directed and only as  much as you need. HEAT AND COLD:   Cold treatment (icing) relieves pain and reduces inflammation. Cold treatment should be  applied for 10 to 15 minutes every 2 to 3 hours for inflammation and pain and immediately after any activity that aggravates your symptoms. Use ice packs or an ice massage.  Heat treatment may be used prior to performing the stretching and strengthening activities prescribed by your caregiver, physical therapist, or athletic trainer. Use a heat pack or a warm soak. SEEK MEDICAL CARE IF:   Symptoms get worse or do not improve in 2 weeks despite treatment.  New, unexplained symptoms develop (drugs used in treatment may produce side effects). EXERCISES RANGE OF MOTION (ROM) AND STRETCHING EXERCISES - Cervical Strain and Sprain These exercises may help you when beginning to rehabilitate your injury. In order to successfully resolve your symptoms, you must improve your posture. These exercises are designed to help reduce the forward-head and rounded-shoulder posture which contributes to this condition. Your symptoms may resolve with or without further involvement from your physician, physical therapist or athletic trainer. While completing these exercises, remember:   Restoring tissue flexibility helps normal motion to return to the joints. This allows healthier, less painful movement and activity.  An effective stretch should be held for at least 20 seconds, although you may need to begin with shorter hold times for comfort.  A stretch should never be painful. You should only feel a gentle lengthening or release in the stretched tissue. STRETCH- Axial Extensors  Lie on your back on the floor. You may bend your knees for comfort. Place a rolled-up hand towel or dish towel, about 2 inches in diameter, under the part of your head that makes contact with the floor.  Gently tuck your chin, as if trying to make a "double chin," until you feel a gentle stretch at the base of your head.  Hold __________ seconds. Repeat __________ times. Complete this exercise __________ times per day.  STRETCH - Axial  Extension   Stand or sit on a firm surface. Assume a good posture: chest up, shoulders drawn back, abdominal muscles slightly tense, knees unlocked (if standing) and feet hip width apart.  Slowly retract your chin so your head slides back and your chin slightly lowers. Continue to look straight ahead.  You should feel a gentle stretch in the back of your head. Be certain not to feel an aggressive stretch since this can cause headaches later.  Hold for __________ seconds. Repeat __________ times. Complete this exercise __________ times per day. STRETCH - Cervical Side Bend   Stand or sit on a firm surface. Assume a good posture: chest up, shoulders drawn back, abdominal muscles slightly tense, knees unlocked (if standing) and feet hip width apart.  Without letting your nose or shoulders move, slowly tip your right / left ear to your shoulder until your feel a gentle stretch in the muscles on the opposite side of your neck.  Hold __________ seconds. Repeat __________ times. Complete this exercise __________ times per day. STRETCH - Cervical Rotators   Stand or sit on a firm surface. Assume a good posture: chest up, shoulders drawn back, abdominal muscles slightly tense, knees unlocked (if standing) and feet hip width apart.  Keeping your eyes level with the ground, slowly turn your head until you feel a gentle stretch along the back and opposite side of your neck.  Hold __________ seconds. Repeat __________ times. Complete this exercise __________ times per day. RANGE OF MOTION -  Neck Circles   Stand or sit on a firm surface. Assume a good posture: chest up, shoulders drawn back, abdominal muscles slightly tense, knees unlocked (if standing) and feet hip width apart.  Gently roll your head down and around from the back of one shoulder to the back of the other. The motion should never be forced or painful.  Repeat the motion 10-20 times, or until you feel the neck muscles relax and  loosen. Repeat __________ times. Complete the exercise __________ times per day. STRENGTHENING EXERCISES - Cervical Strain and Sprain These exercises may help you when beginning to rehabilitate your injury. They may resolve your symptoms with or without further involvement from your physician, physical therapist, or athletic trainer. While completing these exercises, remember:   Muscles can gain both the endurance and the strength needed for everyday activities through controlled exercises.  Complete these exercises as instructed by your physician, physical therapist, or athletic trainer. Progress the resistance and repetitions only as guided.  You may experience muscle soreness or fatigue, but the pain or discomfort you are trying to eliminate should never worsen during these exercises. If this pain does worsen, stop and make certain you are following the directions exactly. If the pain is still present after adjustments, discontinue the exercise until you can discuss the trouble with your clinician. STRENGTH - Cervical Flexors, Isometric  Face a wall, standing about 6 inches away. Place a small pillow, a ball about 6-8 inches in diameter, or a folded towel between your forehead and the wall.  Slightly tuck your chin and gently push your forehead into the soft object. Push only with mild to moderate intensity, building up tension gradually. Keep your jaw and forehead relaxed.  Hold 10 to 20 seconds. Keep your breathing relaxed.  Release the tension slowly. Relax your neck muscles completely before you start the next repetition. Repeat __________ times. Complete this exercise __________ times per day. STRENGTH- Cervical Lateral Flexors, Isometric   Stand about 6 inches away from a wall. Place a small pillow, a ball about 6-8 inches in diameter, or a folded towel between the side of your head and the wall.  Slightly tuck your chin and gently tilt your head into the soft object. Push only with  mild to moderate intensity, building up tension gradually. Keep your jaw and forehead relaxed.  Hold 10 to 20 seconds. Keep your breathing relaxed.  Release the tension slowly. Relax your neck muscles completely before you start the next repetition. Repeat __________ times. Complete this exercise __________ times per day. STRENGTH - Cervical Extensors, Isometric   Stand about 6 inches away from a wall. Place a small pillow, a ball about 6-8 inches in diameter, or a folded towel between the back of your head and the wall.  Slightly tuck your chin and gently tilt your head back into the soft object. Push only with mild to moderate intensity, building up tension gradually. Keep your jaw and forehead relaxed.  Hold 10 to 20 seconds. Keep your breathing relaxed.  Release the tension slowly. Relax your neck muscles completely before you start the next repetition. Repeat __________ times. Complete this exercise __________ times per day. POSTURE AND BODY MECHANICS CONSIDERATIONS - Cervical Strain and Sprain Keeping correct posture when sitting, standing or completing your activities will reduce the stress put on different body tissues, allowing injured tissues a chance to heal and limiting painful experiences. The following are general guidelines for improved posture. Your physician or physical therapist will provide you  with any instructions specific to your needs. While reading these guidelines, remember:  The exercises prescribed by your provider will help you have the flexibility and strength to maintain correct postures.  The correct posture provides the optimal environment for your joints to work. All of your joints have less wear and tear when properly supported by a spine with good posture. This means you will experience a healthier, less painful body.  Correct posture must be practiced with all of your activities, especially prolonged sitting and standing. Correct posture is as important when  doing repetitive low-stress activities (typing) as it is when doing a single heavy-load activity (lifting). PROLONGED STANDING WHILE SLIGHTLY LEANING FORWARD When completing a task that requires you to lean forward while standing in one place for a long time, place either foot up on a stationary 2- to 4-inch high object to help maintain the best posture. When both feet are on the ground, the low back tends to lose its slight inward curve. If this curve flattens (or becomes too large), then the back and your other joints will experience too much stress, fatigue more quickly, and can cause pain.  RESTING POSITIONS Consider which positions are most painful for you when choosing a resting position. If you have pain with flexion-based activities (sitting, bending, stooping, squatting), choose a position that allows you to rest in a less flexed posture. You would want to avoid curling into a fetal position on your side. If your pain worsens with extension-based activities (prolonged standing, working overhead), avoid resting in an extended position such as sleeping on your stomach. Most people will find more comfort when they rest with their spine in a more neutral position, neither too rounded nor too arched. Lying on a non-sagging bed on your side with a pillow between your knees, or on your back with a pillow under your knees will often provide some relief. Keep in mind, being in any one position for a prolonged period of time, no matter how correct your posture, can still lead to stiffness. WALKING Walk with an upright posture. Your ears, shoulders, and hips should all line up. OFFICE WORK When working at a desk, create an environment that supports good, upright posture. Without extra support, muscles fatigue and lead to excessive strain on joints and other tissues. CHAIR:  A chair should be able to slide under your desk when your back makes contact with the back of the chair. This allows you to work  closely.  The chair's height should allow your eyes to be level with the upper part of your monitor and your hands to be slightly lower than your elbows.  Body position:  Your feet should make contact with the floor. If this is not possible, use a foot rest.  Keep your ears over your shoulders. This will reduce stress on your neck and low back.   This information is not intended to replace advice given to you by your health care provider. Make sure you discuss any questions you have with your health care provider.   Document Released: 07/31/2005 Document Revised: 08/21/2014 Document Reviewed: 11/12/2008 Elsevier Interactive Patient Education 2016 [place].  Cryotherapy    Cryotherapy means treatment with cold. Ice or gel packs can be used to reduce both pain and swelling. Ice is the most helpful within the first 24 to 48 hours after an injury or flare-up from overusing a muscle or joint. Sprains, strains, spasms, burning pain, shooting pain, and aches can all be eased with  ice. Ice can also be used when recovering from surgery. Ice is effective, has very few side effects, and is safe for most people to use.  PRECAUTIONS  Ice is not a safe treatment option for people with:  Raynaud phenomenon. This is a condition affecting small blood vessels in the extremities. Exposure to cold may cause your problems to return.  Cold hypersensitivity. There are many forms of cold hypersensitivity, including:  Cold urticaria. Red, itchy hives appear on the skin when the tissues begin to warm after being iced.  Cold erythema. This is a red, itchy rash caused by exposure to cold.  Cold hemoglobinuria. Red blood cells break down when the tissues begin to warm after being iced. The hemoglobin that carry oxygen are passed into the urine because they cannot combine with blood proteins fast enough. Numbness or altered sensitivity in the area being iced. If you have any of the following conditions, do not use  ice until you have discussed cryotherapy with your caregiver:  Heart conditions, such as arrhythmia, angina, or chronic heart disease.  High blood pressure.  Healing wounds or open skin in the area being iced.  Current infections.  Rheumatoid arthritis.  Poor circulation.  Diabetes. Ice slows the blood flow in the region it is applied. This is beneficial when trying to stop inflamed tissues from spreading irritating chemicals to surrounding tissues. However, if you expose your skin to cold temperatures for too long or without the proper protection, you can damage your skin or nerves. Watch for signs of skin damage due to cold.  HOME CARE INSTRUCTIONS  Follow these tips to use ice and cold packs safely.  Place a dry or damp towel between the ice and skin. A damp towel will cool the skin more quickly, so you may need to shorten the time that the ice is used.  For a more rapid response, add gentle compression to the ice.  Ice for no more than 10 to 20 minutes at a time. The bonier the area you are icing, the less time it will take to get the benefits of ice.  Check your skin after 5 minutes to make sure there are no signs of a poor response to cold or skin damage.  Rest 20 minutes or more between uses.  Once your skin is numb, you can end your treatment. You can test numbness by very lightly touching your skin. The touch should be so light that you do not see the skin dimple from the pressure of your fingertip. When using ice, most people will feel these normal sensations in this order: cold, burning, aching, and numbness.  Do not use ice on someone who cannot communicate their responses to pain, such as small children or people with dementia. HOW TO MAKE AN ICE PACK  Ice packs are the most common way to use ice therapy. Other methods include ice massage, ice baths, and cryosprays. Muscle creams that cause a cold, tingly feeling do not offer the same benefits that ice offers and should not be used as  a substitute unless recommended by your caregiver.  To make an ice pack, do one of the following:  Place crushed ice or a bag of frozen vegetables in a sealable plastic bag. Squeeze out the excess air. Place this bag inside another plastic bag. Slide the bag into a pillowcase or place a damp towel between your skin and the bag.  Mix 3 parts water with 1 part rubbing alcohol. Freeze the mixture  in a sealable plastic bag. When you remove the mixture from the freezer, it will be slushy. Squeeze out the excess air. Place this bag inside another plastic bag. Slide the bag into a pillowcase or place a damp towel between your skin and the bag. SEEK MEDICAL CARE IF:  You develop white spots on your skin. This may give the skin a blotchy (mottled) appearance.  Your skin turns blue or pale.  Your skin becomes waxy or hard.  Your swelling gets worse. MAKE SURE YOU:  Understand these instructions.  Will watch your condition.  Will get help right away if you are not doing well or get worse. This information is not intended to replace advice given to you by your health care provider. Make sure you discuss any questions you have with your health care provider.  Document Released: 03/27/2011 Document Revised: 08/21/2014 Document Reviewed: 03/27/2011  Elsevier Interactive Patient Education 2016 [place] therapy can help ease sore, stiff, injured, and tight muscles and joints. Heat relaxes your muscles, which may help ease your pain.  RISKS AND COMPLICATIONS  If you have any of the following conditions, do not use heat therapy unless your health care provider has approved:  Poor circulation.  Healing wounds or scarred skin in the area being treated.  Diabetes, heart disease, or high blood pressure.  Not being able to feel (numbness) the area being treated.  Unusual swelling of the area being treated.  Active infections.  Blood clots.  Cancer.  Inability to communicate pain.  This may include young children and people who have problems with their brain function (dementia).  Pregnancy. Heat therapy should only be used on old, pre-existing, or long-lasting (chronic) injuries. Do not use heat therapy on new injuries unless directed by your health care provider.  HOW TO USE HEAT THERAPY  There are several different kinds of heat therapy, including:  Moist heat pack.  Warm water bath.  Hot water bottle.  Electric heating pad.  Heated gel pack.  Heated wrap.  Electric heating pad. Use the heat therapy method suggested by your health care provider. Follow your health care provider's instructions on when and how to use heat therapy.  GENERAL HEAT THERAPY RECOMMENDATIONS  Do not sleep while using heat therapy. Only use heat therapy while you are awake.  Your skin may turn pink while using heat therapy. Do not use heat therapy if your skin turns red.  Do not use heat therapy if you have new pain.  High heat or long exposure to heat can cause burns. Be careful when using heat therapy to avoid burning your skin.  Do not use heat therapy on areas of your skin that are already irritated, such as with a rash or sunburn. SEEK MEDICAL CARE IF:  You have blisters, redness, swelling, or numbness.  You have new pain.  Your pain is worse. MAKE SURE YOU:  Understand these instructions.  Will watch your condition.  Will get help right away if you are not doing well or get worse. This information is not intended to replace advice given to you by your health care provider. Make sure you discuss any questions you have with your health care provider.  Document Released: 10/23/2011 Document Revised: 08/21/2014 Document Reviewed: 09/23/2013  [employer]vier Interactive Patient Education [employer].

## 2015-06-03 NOTE — ED Provider Notes (Signed)
CSN: [id number]     Arrival date & time 06/03/15  1454 History  By signing my name below, I, Meriel Pica, attest that this documentation has been prepared under the direction and in the presence of Tatyana Kirichenko, PA-C. Electronically Signed: Meriel Pica, ED Scribe. 06/03/2015. 3:37 PM.   Chief Complaint  Patient presents with  . Neck Pain   The history is provided by the patient. No language interpreter was used.   HPI Comments: Albert [name] is a 59 y.o. 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 Pt reports he was assisting another staff member with her car when he was lying on the ground and turned his neck and felt a pop in the left side of his neck. His pain is worse with rotation of the neck. He denies a prior injury to neck, PShx to neck, numbness or tingling. Pt also denies hitting his head during the process. He is followed by Albert Alma, Albert [name] with [facility]. The pt reports his wife is picking him up from work today but he will be returning to work after evaluation to finish out his shift.   Past Medical History  Diagnosis Date  . Hypertension   . History of chicken pox     childhood  . Seasonal allergies   . History of kidney stones   . 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 Sciatica of right side 12/22/2012  . Insomnia 02/25/2013  . Diverticulosis 10/26/2013  . Colon polyps     adenomatous  . DM (diabetes mellitus), type 2 with neurological complications ([place]) 0/10/4915  . Hyperlipidemia 07/26/2014  . 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 Preventative health care 07/26/2014  . Renal lithiasis 02/21/2015   Past Surgical History  Procedure Laterality Date  . Hernia repair    . Abdominal hernia repair    . Rotator cuff repair    . Cholecystectomy    . Mass  excision  05/22/2012    Procedure: EXCISION MASS;  Surgeon: Joyice Faster. Cornett, Albert [name];  Location: [facility];  Service: General;  Laterality: Left;  excision abdominal wall mass  . Laparoscopic appendectomy N/A 08/16/2014    Procedure: APPENDECTOMY LAPAROSCOPIC;  Surgeon: Erroll Luna, Albert [name];  Location: [facility] OR;  Service: General;  Laterality: N/A;   Family History  Problem Relation Age of Onset  . Lung cancer Mother   . Breast cancer Neg Hx   . Colon cancer Neg Hx   . Prostate cancer Neg Hx   . Heart disease Neg Hx   . Diabetes Maternal Aunt   . Cancer Paternal Grandmother    Social History  Substance Use Topics  . Smoking status: Never Smoker   . Smokeless tobacco: Never Used  . Alcohol Use: No    Review of Systems  Musculoskeletal: Positive for neck pain.  Skin: Negative for color change and wound.  Neurological: Negative for dizziness, syncope, weakness, numbness and headaches.  All other systems reviewed and are negative.  Allergies  Contrast media and Iodine  Home Medications   Prior to Admission medications   Medication Sig Start Date End Date Taking? Authorizing Provider  aspirin EC 81 MG tablet Take 1 tablet (81 mg total) by mouth daily. 10/26/14   Mosie Lukes, Albert [name]  atorvastatin (LIPITOR) 40 MG  tablet TAKE 1 TABLET BY MOUTH DAILY 03/24/15   Mosie Lukes, Albert [name]  Blood Glucose Monitoring Suppl (ONE TOUCH ULTRA SYSTEM KIT) W/DEVICE KIT Use as directed to check blood sugar.  Diagnosis code E11.40    Historical Provider, Albert [name]  glucose blood test strip Use as directed twice daily to check blood sugar.  Diagnosis code E11.40    Historical Provider, Albert [name]  ibuprofen (ADVIL,MOTRIN) 800 MG tablet Take 1 tablet (800 mg total) by mouth 3 (three) times daily. 06/03/15   Velora Horstman C Cedric Mcclaine, PA-C  Insulin Glargine (LANTUS) 100 UNIT/ML Solostar Pen Inject 12 Units into the skin daily. 05/20/15   Mosie Lukes, Albert [name]  lisinopril (PRINIVIL,ZESTRIL) 10 MG tablet TAKE 1 TABLET BY MOUTH 2 TIMES  DAILY. 02/08/15   Mosie Lukes, Albert [name]  loperamide (IMODIUM A-D) 2 MG tablet Take 2 mg by mouth 4 (four) times daily as needed for diarrhea or loose stools.    Historical Provider, Albert [name]  methocarbamol (ROBAXIN) 500 MG tablet Take 1 tablet (500 mg total) by mouth 2 (two) times daily as needed for muscle spasms. 05/20/15   Mosie Lukes, Albert [name]  methocarbamol (ROBAXIN) 500 MG tablet Take 1 tablet (500 mg total) by mouth 2 (two) times daily. 06/03/15   Jaydeen Darley C Moana Munford, PA-C  Multiple Vitamins-Minerals (MULTIVITAMIN WITH MINERALS) tablet Take 1 tablet by mouth daily.    Historical Provider, Albert [name]  oxyCODONE-acetaminophen (ROXICET) 5-325 MG tablet Take 1-2 tablets by mouth every 8 (eight) hours as needed for severe pain. 05/20/15   Mosie Lukes, Albert [name]  sitaGLIPtin-metformin (JANUMET) 50-1000 MG tablet Take 1 tablet by mouth 2 (two) times daily. 05/20/15   Mosie Lukes, Albert [name]   BP 128/92 mmHg  Pulse 94  Temp(Src) 98.3 F (36.8 C) (Oral)  Resp 18  Ht 6' (1.829 m)  Wt 190 lb (86.183 kg)  BMI 25.76 kg/m2  SpO2 97% Physical Exam  Constitutional: He appears well-developed and well-nourished. No distress.  HENT:  Head: Normocephalic and atraumatic.  Eyes: Conjunctivae are normal. Pupils are equal, round, and reactive to light.  Neck: Normal range of motion. Neck supple.  Cardiovascular: Normal rate.   Pulmonary/Chest: Effort normal. No respiratory distress.  Musculoskeletal: Normal range of motion.  Full spinal ROM. Tenderness to left neck musculature.   Neurological: He is alert. Coordination normal.  No numbness, tingling or neurologic deficits. Strength 5/5 in all extremities.   Skin: Skin is warm and dry.  Psychiatric: He has a normal mood and affect. His behavior is normal.  Nursing note and vitals reviewed.   ED Course  Procedures  DIAGNOSTIC STUDIES: Oxygen Saturation is 97% on RA, normal by my interpretation.    COORDINATION OF CARE: 3:33 PM Discussed treatment plan with pt at bedside and pt agreed  to plan. Will prescribe ibuprofen and Robaxin. Advised pt he can apply ice and heat as needed. Also advised strict return precautions such as numbness, tingling, or weakness. Pt has a ride home form work today.    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  Pt exhibits no signs of neurologic deficits or other serious injury. Suspect muscle strain. No indications for imaging at this time. Pt discharged with ibuprofen and Robaxin. Pt confirms that he has a ride home after his shift.   I personally performed the services described in this documentation, which was scribed in my presence. The recorded information has been reviewed and  is accurate.    Lorayne Bender, PA-C 06/03/15 1635  Lacretia Leigh, Albert [name] 06/04/15 [phone]

## 2015-06-04 NOTE — Progress Notes (Signed)
At 1426 pt d/c  Off floor via ambulance.  Karie Kirks, Therapist, sports.

## 2015-06-23 ENCOUNTER — Other Ambulatory Visit: Payer: Self-pay | Admitting: Family Medicine

## 2015-06-23 ENCOUNTER — Encounter: Payer: Self-pay | Admitting: Family Medicine

## 2015-06-24 ENCOUNTER — Other Ambulatory Visit: Payer: Self-pay | Admitting: Family Medicine

## 2015-06-24 MED ORDER — OXYCODONE-ACETAMINOPHEN 5-325 MG PO TABS: 1.0000 | ORAL_TABLET | Freq: Three times a day (TID) | ORAL | Status: DC | PRN

## 2015-06-24 NOTE — Telephone Encounter (Signed)
Printed oxycodone per PCP instructions from a mychart refill message. On counter for signature by PCP and patient will be informed to pickup tomorrow 06/25/2015 when PCP arrives in the office.

## 2015-07-09 ENCOUNTER — Encounter: Payer: Self-pay | Admitting: Family Medicine

## 2015-07-20 ENCOUNTER — Ambulatory Visit (INDEPENDENT_AMBULATORY_CARE_PROVIDER_SITE_OTHER): Payer: 59 | Admitting: Medical

## 2015-07-20 ENCOUNTER — Encounter: Payer: Self-pay | Admitting: Medical

## 2015-07-20 VITALS — BP 138/80 | HR 86 | Temp 98.1°F | Ht 72.0 in | Wt 197.6 lb

## 2015-07-20 DIAGNOSIS — J0100 Acute maxillary sinusitis, unspecified: Secondary | ICD-10-CM

## 2015-07-20 MED ORDER — HYDROCODONE-HOMATROPINE 5-1.5 MG/5ML PO SYRP: 5.0000 mL | ORAL_SOLUTION | Freq: Three times a day (TID) | ORAL | Status: DC | PRN

## 2015-07-20 MED ORDER — FLUTICASONE PROPIONATE 50 MCG/ACT NA SUSP: 2.0000 | Freq: Every day | NASAL | Status: DC

## 2015-07-20 MED ORDER — AZITHROMYCIN 250 MG PO TABS
ORAL_TABLET | ORAL | Status: DC
Start: 2015-07-20 — End: 2015-08-26

## 2015-07-20 NOTE — Patient Instructions (Signed)
Sinusitis with some component early OM and possible component preceding URI vs allergies.  Will rx flonase for nasal congestion.  Hycodan cough syrup.  Azithromycin antibiotic.  Follow up 7 days with any residual symptom or as needed.

## 2015-07-20 NOTE — Progress Notes (Signed)
Pre visit review using our clinic review tool, if applicable. No additional management support is needed unless otherwise documented below in the visit note. 

## 2015-07-20 NOTE — Progress Notes (Signed)
Subjective:    [name], male    DOB: 1956-07-03, 59 y.o.   MRN: [id number]  HPI   Pt in with congested, Ha, sinus pressure, ear aches, and some occasional productive cough. Pt had some fevers and chills. Symptoms since Friday.   Review of Systems  Constitutional: Positive for chills and fatigue. Negative for fever.  HENT: Positive for congestion, ear pain, sinus pressure and sneezing.   Respiratory: Positive for cough. Negative for shortness of breath and wheezing.   Cardiovascular: Negative for chest pain and palpitations.  Gastrointestinal: Negative for abdominal pain.  Musculoskeletal: Negative for back pain.  Neurological: Positive for headaches. Negative for dizziness.       Describes around frontal and maxillary sinus areas  Hematological: Negative for adenopathy. Does not bruise/bleed easily.  Psychiatric/Behavioral: Negative for behavioral problems and confusion.    Past Medical History  Diagnosis Date  . Hypertension   . History of chicken pox     childhood  . Seasonal allergies   . History of kidney stones   . 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 Sciatica of right side 12/22/2012  . Insomnia 02/25/2013  . Diverticulosis 10/26/2013  . Colon polyps     adenomatous  . DM (diabetes mellitus), type 2 with neurological complications ([place]) 6/83/7290  . Hyperlipidemia 07/26/2014  . 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 Preventative health care 07/26/2014  . Renal lithiasis 02/21/2015    Social History   Social History  . Marital Status: Married    Spouse Name: N/A  . Number of Children: 3  . Years of Education: N/A   Occupational History  .      Security officer [employer]   Social History Main Topics  . Smoking status: Never Smoker   . Smokeless tobacco: Never Used  . Alcohol Use: No  . Drug Use: No  . Sexual Activity: Not on file   Other Topics Concern  . Not on file   Social  History Narrative    Past Surgical History  Procedure Laterality Date  . Hernia repair    . Abdominal hernia repair    . Rotator cuff repair    . Cholecystectomy    . Mass excision  05/22/2012    Procedure: EXCISION MASS;  Surgeon: Joyice Faster. Cornett, MD;  Location: [facility];  Service: General;  Laterality: Left;  excision abdominal wall mass  . Laparoscopic appendectomy N/A 08/16/2014    Procedure: APPENDECTOMY LAPAROSCOPIC;  Surgeon: Erroll Luna, MD;  Location: [facility] OR;  Service: General;  Laterality: N/A;    Family History  Problem Relation Age of Onset  . Lung cancer Mother   . Breast cancer Neg Hx   . Colon cancer Neg Hx   . Prostate cancer Neg Hx   . Heart disease Neg Hx   . Diabetes Maternal Aunt   . Cancer Paternal Grandmother     Allergies  Allergen Reactions  . Contrast Media [Iodinated Diagnostic Agents] Swelling    Can take if premedicated with diphenhydramine  . Iodine Swelling    Current Outpatient Prescriptions on File Prior to Visit  Medication Sig Dispense Refill  . aspirin EC 81 MG tablet Take 1 tablet (81 mg total) by mouth daily.    Marland Kitchen atorvastatin (LIPITOR) 40 MG tablet TAKE 1 TABLET BY MOUTH DAILY 90 tablet 1  . Blood Glucose Monitoring Suppl (ONE TOUCH ULTRA SYSTEM KIT) W/DEVICE  KIT Use as directed to check blood sugar.  Diagnosis code E11.40    . glucose blood test strip Use as directed twice daily to check blood sugar.  Diagnosis code E11.40    . ibuprofen (ADVIL,MOTRIN) 800 MG tablet Take 1 tablet (800 mg total) by mouth 3 (three) times daily. 21 tablet 0  . Insulin Glargine (LANTUS) 100 UNIT/ML Solostar Pen Inject 12 Units into the skin daily. 15 mL 6  . lisinopril (PRINIVIL,ZESTRIL) 10 MG tablet TAKE 1 TABLET BY MOUTH 2 TIMES DAILY. 180 tablet 3  . loperamide (IMODIUM A-D) 2 MG tablet Take 2 mg by mouth 4 (four) times daily as needed for diarrhea or loose stools.    . methocarbamol (ROBAXIN) 500 MG tablet Take 1 tablet (500 mg total)  by mouth 2 (two) times daily as needed for muscle spasms. 60 tablet 1  . Multiple Vitamins-Minerals (MULTIVITAMIN WITH MINERALS) tablet Take 1 tablet by mouth daily.    Marland Kitchen oxyCODONE-acetaminophen (ROXICET) 5-325 MG tablet Take 1-2 tablets by mouth every 8 (eight) hours as needed for severe pain. 40 tablet 0  . sitaGLIPtin-metformin (JANUMET) 50-1000 MG tablet Take 1 tablet by mouth 2 (two) times daily. 60 tablet 3   No current facility-administered medications on file prior to visit.    BP 153/102 mmHg  Pulse 86  Temp(Src) 98.1 F (36.7 C) (Oral)  Ht 6' (1.829 m)  Wt 197 lb 9.6 oz (89.631 kg)  BMI 26.79 kg/m2  SpO2 97%       Objective:   Physical Exam  General  Mental Status - Alert. General Appearance - Well groomed. Not in acute distress.  Skin Rashes- No Rashes.  HEENT Head- Normal. Ear Auditory Canal - Left- Normal. Right - Normal.Tympanic Membrane- Left- norm Right- pinkish/dull red. Eye Sclera/Conjunctiva- Left- Normal. Right- Normal. Nose & Sinuses Nasal Mucosa- Left-  Boggy and Congested. Right-  Boggy and  Congested.Bilateral  maxillary and frontal sinus pressure. Mouth & Throat Lips: Upper Lip- Normal: no dryness, cracking, pallor, cyanosis, or vesicular eruption. Lower Lip-Normal: no dryness, cracking, pallor, cyanosis or vesicular eruption. Buccal Mucosa- Bilateral- No Aphthous ulcers. Oropharynx- No Discharge or Erythema. Tonsils: Characteristics- Bilateral- No Erythema or Congestion. Size/Enlargement- Bilateral- No enlargement. Discharge- bilateral-None.  Neck Neck- Supple. No Masses.   Chest and Lung Exam Auscultation: Breath Sounds:-Clear even and unlabored.  Cardiovascular Auscultation:Rythm- Regular, rate and rhythm. Murmurs & Other Heart Sounds:Ausculatation of the heart reveal- No Murmurs.  Lymphatic Head & Neck General Head & Neck Lymphatics: Bilateral: Description- No Localized lymphadenopathy.      Assessment & Plan:  Sinusitis with  some component early OM and possible component preceding URI vs allergies.  Will rx flonase for nasal congestion.  Hycodan cough syrup.  Azithromycin antibiotic.  Follow up 7 days with any residual symptom or as needed.

## 2015-07-25 ENCOUNTER — Encounter: Payer: Self-pay | Admitting: Family Medicine

## 2015-07-26 ENCOUNTER — Other Ambulatory Visit: Payer: Self-pay | Admitting: Family Medicine

## 2015-07-26 ENCOUNTER — Telehealth: Payer: Self-pay | Admitting: Family Medicine

## 2015-07-26 MED ORDER — HYDROCODONE-HOMATROPINE 5-1.5 MG/5ML PO SYRP: 5.0000 mL | ORAL_SOLUTION | Freq: Three times a day (TID) | ORAL | Status: DC | PRN

## 2015-07-26 NOTE — Telephone Encounter (Signed)
Left message for patient to call back,.  

## 2015-07-26 NOTE — Telephone Encounter (Signed)
Patient was advised that Rx was ready for pickup at front desk.

## 2015-07-26 NOTE — Telephone Encounter (Signed)
Relation to [id number] Call back [phone]   Reason for call:  Patient requesting a refill HYDROcodone-homatropine (HYCODAN) 5-1.5 MG/5ML syrup

## 2015-07-26 NOTE — Telephone Encounter (Signed)
Pt states he has a continued cough and takes at night to help him sleep too. Ok to call back if needed.

## 2015-08-20 ENCOUNTER — Other Ambulatory Visit (INDEPENDENT_AMBULATORY_CARE_PROVIDER_SITE_OTHER): Payer: 59

## 2015-08-20 DIAGNOSIS — E119 Type 2 diabetes mellitus without complications: Secondary | ICD-10-CM | POA: Diagnosis not present

## 2015-08-20 DIAGNOSIS — I10 Essential (primary) hypertension: Secondary | ICD-10-CM

## 2015-08-20 DIAGNOSIS — E669 Obesity, unspecified: Secondary | ICD-10-CM

## 2015-08-20 DIAGNOSIS — E1149 Type 2 diabetes mellitus with other diabetic neurological complication: Secondary | ICD-10-CM

## 2015-08-20 DIAGNOSIS — E785 Hyperlipidemia, unspecified: Secondary | ICD-10-CM | POA: Diagnosis not present

## 2015-08-20 DIAGNOSIS — E1169 Type 2 diabetes mellitus with other specified complication: Secondary | ICD-10-CM

## 2015-08-20 LAB — CBC
HEMATOCRIT: 45 % (ref 39.0–52.0)
Hemoglobin: 15 g/dL (ref 13.0–17.0)
MCHC: 33.3 g/dL (ref 30.0–36.0)
MCV: 91.3 fl (ref 78.0–100.0)
Platelets: 203 10*3/uL (ref 150.0–400.0)
RBC: 4.93 Mil/uL (ref 4.22–5.81)
RDW: 14.2 % (ref 11.5–15.5)
WBC: 5.4 10*3/uL (ref 4.0–10.5)

## 2015-08-20 LAB — LIPID PANEL
Cholesterol: 115 mg/dL (ref 0–200)
HDL: 34 mg/dL — AB (ref >39.00)
LDL Cholesterol: 60 mg/dL (ref 0–99)
NONHDL: 81.43
TRIGLYCERIDES: 107 mg/dL (ref 0.0–149.0)
Total CHOL/HDL Ratio: 3
VLDL: 21.4 mg/dL (ref 0.0–40.0)

## 2015-08-20 LAB — TSH: TSH: 0.84 u[IU]/mL (ref 0.35–4.50)

## 2015-08-20 LAB — COMPREHENSIVE METABOLIC PANEL
ALT: 19 U/L (ref 0–53)
AST: 16 U/L (ref 0–37)
Albumin: 4.1 g/dL (ref 3.5–5.2)
Alkaline Phosphatase: 64 U/L (ref 39–117)
BUN: 9 mg/dL (ref 6–23)
CO2: 28 mEq/L (ref 19–32)
Calcium: 9.8 mg/dL (ref 8.4–10.5)
Chloride: 101 mEq/L (ref 96–112)
Creatinine, Ser: 0.75 mg/dL (ref 0.40–1.50)
GFR: 113.98 mL/min (ref >60.00)
GLUCOSE: 208 mg/dL — AB (ref 70–99)
Potassium: 5 mEq/L (ref 3.5–5.1)
Sodium: 136 mEq/L (ref 135–145)
TOTAL PROTEIN: 7.3 g/dL (ref 6.0–8.3)
Total Bilirubin: 0.6 mg/dL (ref 0.2–1.2)

## 2015-08-20 LAB — HEMOGLOBIN A1C: HEMOGLOBIN A1C: 9 % — AB (ref 4.6–6.5)

## 2015-08-25 MED FILL — JANUMET 50-1,000 MG TABLET: 50-1000 | 30 days supply | Qty: 60 | Fill #1

## 2015-08-26 ENCOUNTER — Ambulatory Visit (INDEPENDENT_AMBULATORY_CARE_PROVIDER_SITE_OTHER): Payer: 59 | Admitting: Family Medicine

## 2015-08-26 ENCOUNTER — Encounter: Payer: Self-pay | Admitting: Family Medicine

## 2015-08-26 VITALS — BP 118/78 | HR 103 | Temp 98.1°F | Ht 72.0 in | Wt 194.5 lb

## 2015-08-26 DIAGNOSIS — E1169 Type 2 diabetes mellitus with other specified complication: Secondary | ICD-10-CM

## 2015-08-26 DIAGNOSIS — E1149 Type 2 diabetes mellitus with other diabetic neurological complication: Secondary | ICD-10-CM

## 2015-08-26 DIAGNOSIS — M5416 Radiculopathy, lumbar region: Secondary | ICD-10-CM

## 2015-08-26 DIAGNOSIS — E669 Obesity, unspecified: Secondary | ICD-10-CM

## 2015-08-26 DIAGNOSIS — E785 Hyperlipidemia, unspecified: Secondary | ICD-10-CM

## 2015-08-26 DIAGNOSIS — E119 Type 2 diabetes mellitus without complications: Secondary | ICD-10-CM

## 2015-08-26 DIAGNOSIS — Z0000 Encounter for general adult medical examination without abnormal findings: Secondary | ICD-10-CM

## 2015-08-26 DIAGNOSIS — I10 Essential (primary) hypertension: Secondary | ICD-10-CM

## 2015-08-26 MED ORDER — INSULIN GLARGINE 100 UNIT/ML SOLOSTAR PEN: 16.0000 [IU] | PEN_INJECTOR | Freq: Every day | SUBCUTANEOUS | Status: DC

## 2015-08-26 MED ORDER — MECLIZINE HCL 25 MG PO TABS: 25.0000 mg | ORAL_TABLET | Freq: Three times a day (TID) | ORAL | Status: DC | PRN

## 2015-08-26 MED ORDER — MUPIROCIN 2 % EX OINT: 1.0000 "application " | TOPICAL_OINTMENT | Freq: Two times a day (BID) | CUTANEOUS | Status: DC

## 2015-08-26 MED ORDER — HYOSCYAMINE SULFATE 0.125 MG SL SUBL: 0.1250 mg | SUBLINGUAL_TABLET | SUBLINGUAL | Status: DC | PRN

## 2015-08-26 MED ORDER — HYDROCODONE-HOMATROPINE 5-1.5 MG/5ML PO SYRP: 5.0000 mL | ORAL_SOLUTION | Freq: Three times a day (TID) | ORAL | Status: DC | PRN

## 2015-08-26 MED FILL — HYDROCODONE-HOMATROPINE SYR: 5-1.5 | 9 days supply | Qty: 120 | Fill #0

## 2015-08-26 MED FILL — MUPIROCIN 2% OINTMENT: 2 | 10 days supply | Qty: 22 | Fill #0

## 2015-08-26 MED FILL — HYOSCYAMINE 0.125 MG TAB SL: 0.125 | 5 days supply | Qty: 30 | Fill #0

## 2015-08-26 MED FILL — MECLIZINE 25 MG TABLET: 25 | 10 days supply | Qty: 30 | Fill #0

## 2015-08-26 NOTE — Assessment & Plan Note (Signed)
Well controlled, no changes to meds. Encouraged heart healthy diet such as the DASH diet and exercise as tolerated.  °

## 2015-08-26 NOTE — Patient Instructions (Signed)
Continue current meds but increase Lantus to 16 units then can increase by 2 units every 3 days if sugars remain consistently hi above 130    Basic Carbohydrate Counting for Diabetes Mellitus Carbohydrate counting is a method for keeping track of the amount of carbohydrates you eat. Eating carbohydrates naturally increases the level of sugar (glucose) in your blood, so it is important for you to know the amount that is okay for you to have in every meal. Carbohydrate counting helps keep the level of glucose in your blood within normal limits. The amount of carbohydrates allowed is different for every person. A dietitian can help you calculate the amount that is right for you. Once you know the amount of carbohydrates you can have, you can count the carbohydrates in the foods you want to eat. Carbohydrates are found in the following foods:  Grains, such as breads and cereals.  Dried beans and soy products.  Starchy vegetables, such as potatoes, peas, and corn.  Fruit and fruit juices.  Milk and yogurt.  Sweets and snack foods, such as cake, cookies, candy, chips, soft drinks, and fruit drinks. CARBOHYDRATE COUNTING There are two ways to count the carbohydrates in your food. You can use either of the methods or a combination of both. Reading the "Nutrition Facts" on [place] The "Nutrition Facts" is an area that is included on the labels of almost all packaged food and beverages in the [place]. It includes the serving size of that food or beverage and information about the nutrients in each serving of the food, including the grams (g) of carbohydrate per serving.  Decide the number of servings of this food or beverage that you will be able to eat or drink. Multiply that number of servings by the number of grams of carbohydrate that is listed on the label for that serving. The total will be the amount of carbohydrates you will be having when you eat or drink this food or  beverage. Learning Standard Serving Sizes of Food When you eat food that is not packaged or does not include "Nutrition Facts" on the label, you need to measure the servings in order to count the amount of carbohydrates.A serving of most carbohydrate-rich foods contains about 15 g of carbohydrates. The following list includes serving sizes of carbohydrate-rich foods that provide 15 g ofcarbohydrate per serving:   1 slice of bread (1 oz) or 1 six-inch tortilla.    of a hamburger bun or English muffin.  4-6 crackers.   cup unsweetened dry cereal.    cup hot cereal.   cup rice or pasta.    cup mashed potatoes or  of a large baked potato.  1 cup fresh fruit or one small piece of fruit.    cup canned or frozen fruit or fruit juice.  1 cup milk.   cup plain fat-free yogurt or yogurt sweetened with artificial sweeteners.   cup cooked dried beans or starchy vegetable, such as peas, corn, or potatoes.  Decide the number of standard-size servings that you will eat. Multiply that number of servings by 15 (the grams of carbohydrates in that serving). For example, if you eat 2 cups of strawberries, you will have eaten 2 servings and 30 g of carbohydrates (2 servings x 15 g = 30 g). For foods such as soups and casseroles, in which more than one food is mixed in, you will need to count the carbohydrates in each food that is included. EXAMPLE OF CARBOHYDRATE  COUNTING Sample Dinner  3 oz chicken breast.   cup of brown rice.   cup of corn.  1 cup milk.   1 cup strawberries with sugar-free whipped topping.  Carbohydrate Calculation Step 1: Identify the foods that contain carbohydrates:   Rice.   Corn.   Milk.   Strawberries. Step 2:Calculate the number of servings eaten of each:   2 servings of rice.   1 serving of corn.   1 serving of milk.   1 serving of strawberries. Step 3: Multiply each of those number of servings by 15 g:   2 servings of  rice x 15 g = 30 g.   1 serving of corn x 15 g = 15 g.   1 serving of milk x 15 g = 15 g.   1 serving of strawberries x 15 g = 15 g. Step 4: Add together all of the amounts to find the total grams of carbohydrates eaten: 30 g + 15 g + 15 g + 15 g = 75 g.   This information is not intended to replace advice given to you by your health care provider. Make sure you discuss any questions you have with your health care provider.   Document Released: 07/31/2005 Document Revised: 08/21/2014 Document Reviewed: 06/27/2013 Elsevier Interactive Patient Education 2016 [place] current meds but increase Lantus to 16 units then can increase by 2 units every 3 days if sugars remain consistently hi above 130

## 2015-08-26 NOTE — Progress Notes (Signed)
[name], male   DOB: Oct 10, 1955, 60 y.o.   MRN: [id number]   Subjective:    [name], male    DOB: 14-Oct-1955, 60 y.o.   MRN: [id number]  Chief Complaint  Patient presents with  . Follow-up    HPI Patient is in today for follow-up. He continues to struggle with back pain manages with his current medications. 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 No incontinence. He denies any recent acute illness. Endorses some fatigue. Endorses some polyuria but denies dysuria. Denies CP/palp/SOB/HA/congestion/fevers/GI c/o. Taking meds as prescribed  Past Medical History  Diagnosis Date  . Hypertension   . History of chicken pox     childhood  . Seasonal allergies   . History of kidney stones   . 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 Sciatica of right side 12/22/2012  . Insomnia 02/25/2013  . Diverticulosis 10/26/2013  . Colon polyps     adenomatous  . DM (diabetes mellitus), type 2 with neurological complications ([place]) 8/34/3735  . Hyperlipidemia 07/26/2014  . 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 Preventative health care 07/26/2014  . Renal lithiasis 02/21/2015    Past Surgical History  Procedure Laterality Date  . Hernia repair    . Abdominal hernia repair    . Rotator cuff repair    . Cholecystectomy    . Mass excision  05/22/2012    Procedure: EXCISION MASS;  Surgeon: Joyice Faster. Cornett, MD;  Location: [place];  Service: General;  Laterality: Left;  excision abdominal wall mass  . Laparoscopic appendectomy N/A 08/16/2014    Procedure: APPENDECTOMY LAPAROSCOPIC;  Surgeon: Erroll Luna, MD;  Location[facility] OR;  Service: General;  Laterality: N/A;    Family History  Problem Relation Age of Onset  . Lung cancer Mother   . Breast cancer Neg Hx   . Colon cancer Neg Hx   . Prostate cancer Neg Hx   . Heart disease Neg Hx   .  Diabetes Maternal Aunt   . Cancer Paternal Grandmother     Social History   Social History  . Marital Status: Married    Spouse Name: N/A  . Number of Children: 3  . Years of Education: N/A   Occupational History  .      Security officer [employer]   Social History Main Topics  . Smoking status: Never Smoker   . Smokeless tobacco: Never Used  . Alcohol Use: No  . Drug Use: No  . Sexual Activity: Not on file   Other Topics Concern  . Not on file   Social History Narrative    Outpatient Prescriptions Prior to Visit  Medication Sig Dispense Refill  . aspirin EC 81 MG tablet Take 1 tablet (81 mg total) by mouth daily.    Marland Kitchen atorvastatin (LIPITOR) 40 MG tablet TAKE 1 TABLET BY MOUTH DAILY 90 tablet 1  . Blood Glucose Monitoring Suppl (ONE TOUCH ULTRA SYSTEM KIT) W/DEVICE KIT Use as directed to check blood sugar.  Diagnosis code E11.40    . fluticasone (FLONASE) 50 MCG/ACT nasal spray Place 2 sprays into both nostrils daily. 16 g 1  . glucose blood test strip Use as directed twice daily to check blood sugar.  Diagnosis code E11.40    . ibuprofen (ADVIL,MOTRIN) 800 MG tablet Take 1 tablet (800 mg total) by mouth 3 (  three) times daily. 21 tablet 0  . lisinopril (PRINIVIL,ZESTRIL) 10 MG tablet TAKE 1 TABLET BY MOUTH 2 TIMES DAILY. 180 tablet 3  . loperamide (IMODIUM A-D) 2 MG tablet Take 2 mg by mouth 4 (four) times daily as needed for diarrhea or loose stools.    . Multiple Vitamins-Minerals (MULTIVITAMIN WITH MINERALS) tablet Take 1 tablet by mouth daily.    Marland Kitchen oxyCODONE-acetaminophen (ROXICET) 5-325 MG tablet Take 1-2 tablets by mouth every 8 (eight) hours as needed for severe pain. 40 tablet 0  . sitaGLIPtin-metformin (JANUMET) 50-1000 MG tablet Take 1 tablet by mouth 2 (two) times daily. 60 tablet 3  . azithromycin (ZITHROMAX) 250 MG tablet Take 2 tablets by mouth on day 1, followed by 1 tablet by mouth daily for 4 days. 6 tablet 0  . HYDROcodone-homatropine (HYCODAN) 5-1.5 MG/5ML syrup  Take 5 mLs by mouth every 8 (eight) hours as needed for cough. 120 mL 0  . Insulin Glargine (LANTUS) 100 UNIT/ML Solostar Pen Inject 12 Units into the skin daily. 15 mL 6  . methocarbamol (ROBAXIN) 500 MG tablet Take 1 tablet (500 mg total) by mouth 2 (two) times daily as needed for muscle spasms. 60 tablet 1   No facility-administered medications prior to visit.    Allergies  Allergen Reactions  . Contrast Media [Iodinated Diagnostic Agents] Swelling    Can take if premedicated with diphenhydramine  . Iodine Swelling    Review of Systems  Constitutional: Positive for malaise/fatigue. Negative for fever.  HENT: Negative for congestion.   Eyes: Negative for discharge.  Respiratory: Negative for shortness of breath.   Cardiovascular: Negative for chest pain, palpitations and leg swelling.  Gastrointestinal: Negative for nausea and abdominal pain.  Genitourinary: Negative for dysuria.  Musculoskeletal: Positive for back pain and joint pain. Negative for falls.  Skin: Negative for rash.  Neurological: Negative for loss of consciousness and headaches.  Endo/Heme/Allergies: Negative for environmental allergies.  Psychiatric/Behavioral: Negative for depression. The patient is not nervous/anxious.        Objective:    Physical Exam  Constitutional: He is oriented to person, place, and time. He appears well-developed and well-nourished. No distress.  HENT:  Head: Normocephalic and atraumatic.  Nose: Nose normal.  Eyes: Right eye exhibits no discharge. Left eye exhibits no discharge.  Neck: Normal range of motion. Neck supple.  Cardiovascular: Normal rate and regular rhythm.   Pulmonary/Chest: Effort normal and breath sounds normal.  Abdominal: Soft. Bowel sounds are normal. There is no tenderness.  Musculoskeletal: He exhibits no edema.  Neurological: He is alert and oriented to person, place, and time.  Skin: Skin is warm and dry.  Psychiatric: He has a normal mood and affect.    Nursing note and vitals reviewed.   BP 118/78 mmHg  Pulse 103  Temp(Src) 98.1 F (36.7 C) (Oral)  Ht 6' (1.829 m)  Wt 194 lb 8 oz (88.225 kg)  BMI 26.37 kg/m2  SpO2 96% Wt Readings from Last 3 Encounters:  08/26/15 194 lb 8 oz (88.225 kg)  07/20/15 197 lb 9.6 oz (89.631 kg)  06/03/15 190 lb (86.183 kg)     Lab Results  Component Value Date   WBC 5.4 08/20/2015   HGB 15.0 08/20/2015   HCT 45.0 08/20/2015   PLT 203.0 08/20/2015   GLUCOSE 208* 08/20/2015   CHOL 115 08/20/2015   TRIG 107.0 08/20/2015   HDL 34.00* 08/20/2015   LDLDIRECT 109.0 10/26/2014   LDLCALC 60 08/20/2015   ALT 19 08/20/2015  AST 16 08/20/2015   NA 136 08/20/2015   K 5.0 08/20/2015   CL 101 08/20/2015   CREATININE 0.75 08/20/2015   BUN 9 08/20/2015   CO2 28 08/20/2015   TSH 0.84 08/20/2015   PSA 0.64 09/12/2013   INR 1.10 08/16/2014   HGBA1C 9.0* 08/20/2015   MICROALBUR 2.59* 03/15/2012    Lab Results  Component Value Date   TSH 0.84 08/20/2015   Lab Results  Component Value Date   WBC 5.4 08/20/2015   HGB 15.0 08/20/2015   HCT 45.0 08/20/2015   MCV 91.3 08/20/2015   PLT 203.0 08/20/2015   Lab Results  Component Value Date   NA 136 08/20/2015   K 5.0 08/20/2015   CO2 28 08/20/2015   GLUCOSE 208* 08/20/2015   BUN 9 08/20/2015   CREATININE 0.75 08/20/2015   BILITOT 0.6 08/20/2015   ALKPHOS 64 08/20/2015   AST 16 08/20/2015   ALT 19 08/20/2015   PROT 7.3 08/20/2015   ALBUMIN 4.1 08/20/2015   CALCIUM 9.8 08/20/2015   ANIONGAP 11 01/31/2015   GFR 113.98 08/20/2015   Lab Results  Component Value Date   CHOL 115 08/20/2015   Lab Results  Component Value Date   HDL 34.00* 08/20/2015   Lab Results  Component Value Date   LDLCALC 60 08/20/2015   Lab Results  Component Value Date   TRIG 107.0 08/20/2015   Lab Results  Component Value Date   CHOLHDL 3 08/20/2015   Lab Results  Component Value Date   HGBA1C 9.0* 08/20/2015       Assessment & Plan:   Problem  List Items Addressed This Visit    DM (diabetes mellitus), type 2 with neurological complications ([place])    [id number] unacceptable, minimize simple carbs. Increase exercise as tolerated. Continue current meds but increase Lantus to 16 units then can increase by 2 units every 3 days if sugars remain consistently hi above 130. Foot exam done today, referred to opthamology      Relevant Medications   Insulin Glargine (LANTUS) 100 UNIT/ML Solostar Pen   Other Relevant Orders   TSH   CBC   Comprehensive metabolic panel   Hemoglobin A1c   Microalbumin / creatinine urine ratio   HTN (hypertension) - Primary    Well controlled, no changes to meds. Encouraged heart healthy diet such as the DASH diet and exercise as tolerated.       Relevant Orders   TSH   CBC   Comprehensive metabolic panel   Hyperlipidemia    Tolerating statin, encouraged heart healthy diet, avoid trans fats, minimize simple carbs and saturated fats. Increase exercise as tolerated      Relevant Orders   TSH   CBC   Comprehensive metabolic panel   Lipid panel   Lumbar radiculopathy    Continues to work despite his ongoing pain, may continue current meds for now and stay as active as tolerated      Preventative health care   Relevant Orders   Ambulatory referral to Ophthalmology    Other Visit Diagnoses    Diabetes mellitus type 2 in obese (HCC)        Relevant Medications    Insulin Glargine (LANTUS) 100 UNIT/ML Solostar Pen    Type 2 diabetes mellitus without complication, without long-term current use of insulin (HCC)        Relevant Medications    Insulin Glargine (LANTUS) 100 UNIT/ML Solostar Pen    Other Relevant Orders  Ambulatory referral to Ophthalmology       I have discontinued Mr. Stepanek methocarbamol and azithromycin. I have also changed his Insulin Glargine. Additionally, I am having him start on hyoscyamine, mupirocin ointment, and meclizine. Lastly, I am having him maintain his loperamide,  aspirin EC, ONE TOUCH ULTRA SYSTEM KIT, glucose blood, multivitamin with minerals, lisinopril, atorvastatin, sitaGLIPtin-metformin, ibuprofen, oxyCODONE-acetaminophen, fluticasone, and HYDROcodone-homatropine.  Meds ordered this encounter  Medications  . Insulin Glargine (LANTUS) 100 UNIT/ML Solostar Pen    Sig: Inject 16 Units into the skin daily.    Dispense:  15 mL    Refill:  6    DISCONTINUE ALL PREVIOUS REFILLS FOR THIS MEDICATION  . hyoscyamine (LEVSIN SL) 0.125 MG SL tablet    Sig: Place 1 tablet (0.125 mg total) under the tongue every 4 (four) hours as needed (hiccups).    Dispense:  30 tablet    Refill:  0  . mupirocin ointment (BACTROBAN) 2 %    Sig: Place 1 application into the nose 2 (two) times daily.    Dispense:  22 g    Refill:  0  . HYDROcodone-homatropine (HYCODAN) 5-1.5 MG/5ML syrup    Sig: Take 5 mLs by mouth every 8 (eight) hours as needed for cough.    Dispense:  120 mL    Refill:  0  . meclizine (ANTIVERT) 25 MG tablet    Sig: Take 1 tablet (25 mg total) by mouth 3 (three) times daily as needed for dizziness.    Dispense:  30 tablet    Refill:  0     Willette Alma, MD

## 2015-08-26 NOTE — Assessment & Plan Note (Signed)
Tolerating statin, encouraged heart healthy diet, avoid trans fats, minimize simple carbs and saturated fats. Increase exercise as tolerated 

## 2015-08-26 NOTE — Assessment & Plan Note (Addendum)
hgba1c unacceptable, minimize simple carbs. Increase exercise as tolerated. Continue current meds but increase Lantus to 16 units then can increase by 2 units every 3 days if sugars remain consistently hi above 130. Foot exam done today, referred to opthamology

## 2015-08-26 NOTE — Progress Notes (Signed)
Pre visit review using our clinic review tool, if applicable. No additional management support is needed unless otherwise documented below in the visit note. 

## 2015-09-03 DIAGNOSIS — E119 Type 2 diabetes mellitus without complications: Secondary | ICD-10-CM | POA: Diagnosis not present

## 2015-09-03 LAB — HM DIABETES EYE EXAM

## 2015-09-05 NOTE — Assessment & Plan Note (Signed)
Continues to work despite his ongoing pain, may continue current meds for now and stay as active as tolerated

## 2015-09-06 ENCOUNTER — Telehealth: Payer: Self-pay | Admitting: Family Medicine

## 2015-09-06 NOTE — Telephone Encounter (Signed)
Spoke to the patient and he will discuss with his wife.

## 2015-09-06 NOTE — Telephone Encounter (Signed)
-----   Message from Mosie Lukes, MD sent at 09/05/2015  2:26 PM EST ----- I would love for him to take a referral to endocrinology due to increased hgba1c, see if he is willing to accept referral

## 2015-09-06 NOTE — Telephone Encounter (Signed)
Called left message to call back 

## 2015-09-07 ENCOUNTER — Ambulatory Visit: Payer: 59

## 2015-09-07 ENCOUNTER — Ambulatory Visit (INDEPENDENT_AMBULATORY_CARE_PROVIDER_SITE_OTHER): Payer: 59 | Admitting: Behavioral Health

## 2015-09-07 DIAGNOSIS — Z23 Encounter for immunization: Secondary | ICD-10-CM | POA: Diagnosis not present

## 2015-09-07 NOTE — Progress Notes (Signed)
Pre visit review using our clinic review tool, if applicable. No additional management support is needed unless otherwise documented below in the visit note.  Patient tolerated injection well.  

## 2015-09-08 ENCOUNTER — Encounter: Payer: Self-pay | Admitting: Family Medicine

## 2015-09-09 ENCOUNTER — Other Ambulatory Visit: Payer: Self-pay | Admitting: Family Medicine

## 2015-09-09 DIAGNOSIS — E1149 Type 2 diabetes mellitus with other diabetic neurological complication: Secondary | ICD-10-CM

## 2015-09-10 MED FILL — FLUTICASONE PROP 50 MCG SPR: 50 | 30 days supply | Qty: 16 | Fill #0

## 2015-09-14 ENCOUNTER — Encounter: Payer: Self-pay | Admitting: Family Medicine

## 2015-09-15 ENCOUNTER — Encounter: Payer: Self-pay | Admitting: Family Medicine

## 2015-09-20 ENCOUNTER — Other Ambulatory Visit: Payer: Self-pay | Admitting: Family Medicine

## 2015-09-20 MED FILL — ATORVASTATIN 40 MG TABLET: 40 | 90 days supply | Qty: 90 | Fill #0

## 2015-09-27 MED FILL — JANUMET 50-1,000 MG TABLET: 50-1000 | 30 days supply | Qty: 60 | Fill #0

## 2015-10-07 ENCOUNTER — Ambulatory Visit (INDEPENDENT_AMBULATORY_CARE_PROVIDER_SITE_OTHER): Payer: 59 | Admitting: Endocrinology

## 2015-10-07 ENCOUNTER — Telehealth: Payer: Self-pay | Admitting: Endocrinology

## 2015-10-07 ENCOUNTER — Encounter: Payer: Self-pay | Admitting: Endocrinology

## 2015-10-07 VITALS — BP 120/64 | HR 86 | Ht 73.0 in | Wt 196.0 lb

## 2015-10-07 DIAGNOSIS — Z794 Long term (current) use of insulin: Secondary | ICD-10-CM

## 2015-10-07 DIAGNOSIS — E785 Hyperlipidemia, unspecified: Secondary | ICD-10-CM | POA: Diagnosis not present

## 2015-10-07 DIAGNOSIS — N529 Male erectile dysfunction, unspecified: Secondary | ICD-10-CM | POA: Insufficient documentation

## 2015-10-07 DIAGNOSIS — N528 Other male erectile dysfunction: Secondary | ICD-10-CM

## 2015-10-07 DIAGNOSIS — E1165 Type 2 diabetes mellitus with hyperglycemia: Secondary | ICD-10-CM

## 2015-10-07 MED ORDER — TADALAFIL 20 MG PO TABS
20.0000 mg | ORAL_TABLET | Freq: Every day | ORAL | Status: DC | PRN
Start: 2015-10-07 — End: 2016-05-31

## 2015-10-07 MED ORDER — GLUCOSE BLOOD VI STRP: ORAL_STRIP | Status: DC

## 2015-10-07 MED ORDER — CANAGLIFLOZIN 100 MG PO TABS: ORAL_TABLET | ORAL | Status: DC

## 2015-10-07 MED FILL — CIALIS 20 MG TABLET: 20 | 30 days supply | Qty: 6 | Fill #0

## 2015-10-07 MED FILL — INVOKANA 100 MG TABLET: 100 | 30 days supply | Qty: 30 | Fill #0

## 2015-10-07 MED FILL — CONTOUR NEXT STRIPS: 50 days supply | Qty: 100 | Fill #0

## 2015-10-07 NOTE — Patient Instructions (Signed)
Stop Lisinopril and janumet  Check blood sugars on waking up 3-4 times a week Also check blood sugars about 2 hours after a meal  Once daily and do this after different meals by rotation  Recommended blood sugar levels on waking up is 90-130 and about 2 hours after meal is 130-160  Please bring your blood sugar monitor to each visit, thank you

## 2015-10-07 NOTE — Progress Notes (Signed)
[name], male   DOB: 1955-11-18, 60 y.o.   MRN: [id number]           Reason for Appointment: Consultation for Type 2 Diabetes   History of Present Illness:          Date of diagnosis of type 2 diabetes mellitus:        Background history:   He thinks his blood sugars at the time of diagnosis was around 500 and he was symptomatic with increased thirst and urination He has been taking metformin for several years but also has been tried on Amaryl Since about 2013 he has been on Januvia In early 2016 his blood sugars were significantly higher with A1c 11.9 and he was started on Lantus insulin, initially 10 units Janumet was continued  Recent history:   INSULIN regimen is: 16 units Lantus in am     Current management, blood sugar patterns and problems identified:    his blood sugars were poorly controlled over the last 3 months or so and A1c had gone up to 9% in January  He was told to increase his Lantus to 16 units and continue his Janumet  He has been inconsistent with his diet over the last few weeks but more recently because his blood sugars had been harder to control he has been improving his diet with cutting back on carbohydrates, fats and adding more salads.  He has been more consistent since about 10 days ago when his blood sugar went up to 287  He thinks his blood sugars are higher in the afternoon but recently has checked it only once  He is eating more carbohydrates at lunch than at suppertime but has not done any readings except once after supper  He has had a couple of episodes of hypoglycemia, once felt sweaty and weak at about midnight and another time got hypoglycemic at work after being very active with glucose down to 58, this was last week  Non-insulin hypoglycemic drugs the patient is taking are: Janumet     Side effects from medications have been:  Compliance with the medical regimen:  Hypoglycemia:    Glucose monitoring:  done 1-2 times a  day         Glucometer: One Touch ultra.      Blood Glucose readings by time of day and averages from meter download:  PREMEAL Breakfast Lunch Dinner Bedtime  Overall   Glucose range:  98-271  123-160    98    Median:  150      141    POST-MEAL PC Breakfast PC Lunch PC Dinner  Glucose range:   154, 287   161   Median:      Self-care: The diet that the patient has been following is: tries to limit high-fat foods, carbohydrates .     Meal times are:  Breakfast is at 6 AM, Lunch: 12-1 PM Dinner: 6-7 PM   Typical meal intake: Breakfast is   usually instant oatmeal, usually taking lunch from home to work              Dietician visit, most recent:               Exercise:    Weight history:   Wt Readings from Last 3 Encounters:  10/07/15 196 lb (88.905 kg)  08/26/15 194 lb 8 oz (88.225 kg)  07/20/15 197 lb 9.6 oz (89.631 kg)    Glycemic control:   Lab Results  Component Value Date   HGBA1C 9.0* 08/20/2015   HGBA1C 8.0* 05/13/2015   HGBA1C 7.8* 02/08/2015   Lab Results  Component Value Date   MICROALBUR 2.59* 03/15/2012   LDLCALC 60 08/20/2015   CREATININE 0.75 08/20/2015          Medication List       This list is accurate as of: 10/07/15 12:57 PM.  Always use your most recent med list.               aspirin EC 81 MG tablet  Take 1 tablet (81 mg total) by mouth daily.     atorvastatin 40 MG tablet  Commonly known as:  LIPITOR  TAKE 1 TABLET BY MOUTH DAILY     canagliflozin 100 MG Tabs tablet  Commonly known as:  INVOKANA  1 tablet before breakfast     fluticasone 50 MCG/ACT nasal spray  Commonly known as:  FLONASE  Place 2 sprays into both nostrils daily.     glucose blood test strip  Commonly known as:  BAYER CONTOUR TEST  Test blood sugars as directed. Dx E11.49     HYDROcodone-homatropine 5-1.5 MG/5ML syrup  Commonly known as:  HYCODAN  Take 5 mLs by mouth every 8 (eight) hours as needed for cough.     hyoscyamine 0.125 MG SL tablet  Commonly  known as:  LEVSIN SL  Place 1 tablet (0.125 mg total) under the tongue every 4 (four) hours as needed (hiccups).     ibuprofen 800 MG tablet  Commonly known as:  ADVIL,MOTRIN  Take 1 tablet (800 mg total) by mouth 3 (three) times daily.     Insulin Glargine 100 UNIT/ML Solostar Pen  Commonly known as:  LANTUS  Inject 16 Units into the skin daily.     lisinopril 10 MG tablet  Commonly known as:  PRINIVIL,ZESTRIL  TAKE 1 TABLET BY MOUTH 2 TIMES DAILY.     loperamide 2 MG tablet  Commonly known as:  IMODIUM A-D  Take 2 mg by mouth 4 (four) times daily as needed for diarrhea or loose stools.     meclizine 25 MG tablet  Commonly known as:  ANTIVERT  Take 1 tablet (25 mg total) by mouth 3 (three) times daily as needed for dizziness.     multivitamin with minerals tablet  Take 1 tablet by mouth daily.     mupirocin ointment 2 %  Commonly known as:  BACTROBAN  Place 1 application into the nose 2 (two) times daily.     ONE TOUCH ULTRA SYSTEM KIT w/Device Kit  Use as directed to check blood sugar.  Diagnosis code E11.40     sitaGLIPtin-metformin 50-1000 MG tablet  Commonly known as:  JANUMET  Take 1 tablet by mouth 2 (two) times daily.     tadalafil 20 MG tablet  Commonly known as:  CIALIS  Take 1 tablet (20 mg total) by mouth daily as needed for erectile dysfunction.        Allergies:  Allergies  Allergen Reactions  . Contrast Media [Iodinated Diagnostic Agents] Swelling    Can take if premedicated with diphenhydramine  . Iodine Swelling    Past Medical History  Diagnosis Date  . Hypertension   . History of chicken pox     childhood  . Seasonal allergies   . History of kidney stones   . 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 Sciatica of right side 12/22/2012  .  Insomnia 02/25/2013  . Diverticulosis 10/26/2013  . Colon polyps     adenomatous  . DM (diabetes mellitus), type 2 with neurological complications ([place]) 8/41/6606  .  Hyperlipidemia 07/26/2014  . 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 Preventative health care 07/26/2014  . Renal lithiasis 02/21/2015    Past Surgical History  Procedure Laterality Date  . Hernia repair    . Abdominal hernia repair    . Rotator cuff repair    . Cholecystectomy    . Mass excision  05/22/2012    Procedure: EXCISION MASS;  Surgeon: Joyice Faster. Cornett, MD;  Location: [place];  Service: General;  Laterality: Left;  excision abdominal wall mass  . Laparoscopic appendectomy N/A 08/16/2014    Procedure: APPENDECTOMY LAPAROSCOPIC;  Surgeon: Erroll Luna, MD;  Location: [facility] OR;  Service: General;  Laterality: N/A;    Family History  Problem Relation Age of Onset  . Lung cancer Mother   . Breast cancer Neg Hx   . Colon cancer Neg Hx   . Prostate cancer Neg Hx   . Heart disease Neg Hx   . Diabetes Maternal Aunt   . Cancer Paternal Grandmother     Social History:  reports that he has never smoked. He has never used smokeless tobacco. He reports that he does not drink alcohol or use illicit drugs.    Review of Systems    Lipid history: On long-term treatment with statin    Lab Results  Component Value Date   CHOL 115 08/20/2015   HDL 34.00* 08/20/2015   LDLCALC 60 08/20/2015   LDLDIRECT 109.0 10/26/2014   TRIG 107.0 08/20/2015   CHOLHDL 3 08/20/2015           Hypertension: Mild and taking only 10 mg lisinopril  Most recent eye exam was 1/17  Most recent foot exam: 2/17  Review of Systems  Constitutional: Negative for weight loss and malaise.  HENT: Negative for headaches.   Eyes: Negative for blurred vision.  Respiratory: Negative for shortness of breath.   Cardiovascular: Negative for chest pain, leg swelling and claudication.  Gastrointestinal: Positive for diarrhea.       From metformin, uses Imodium twice a day to control this  Endocrine: Positive for erectile dysfunction. Negative for fatigue and decreased  libido.  Genitourinary: Negative for nocturia.  Musculoskeletal: Negative for joint pain, muscle aches and back pain.  Neurological: Positive for tingling.       Occasionally, not recently  Psychiatric/Behavioral: Negative for depressed mood.      LABS:  No visits with results within 1 Week(s) from this visit. Latest known visit with results is:  Abstract on 09/14/2015  Component Date Value Ref Range Status  . HM Diabetic Eye Exam 09/03/2015 No Retinopathy  No Retinopathy Final   [facility].    Physical Examination:  BP 120/64 mmHg  Pulse 86  Ht 6' 1"  (1.854 m)  Wt 196 lb (88.905 kg)  BMI 25.86 kg/m2  SpO2 94%  Well-built and nourished, no significant obesity HEENT:         Eye exam shows normal external appearance. Fundus exam shows no retinopathy but difficult to visualize relatively small pupils.  Oral exam shows normal mucosa .  NECK:   There is no lymphadenopathy Thyroid is not enlarged and no nodules felt.  Carotids are normal to palpation and no bruit heard LUNGS:         Chest is symmetrical. Lungs are clear to auscultation.Marland Kitchen  HEART:         Heart sounds:  S1 and S2 are normal. No murmur or click heard., no S3 or S4.   ABDOMEN:   There is no distention present. Liver and spleen are not palpable. No other mass or tenderness present.    NEUROLOGICAL:   Ankle jerks are absent bilaterally.    Diabetic Foot Exam - Simple   Simple Foot Form  Diabetic Foot exam was performed with the following findings:  Yes   Visual Inspection  No deformities, no ulcerations, no other skin breakdown bilaterally:  Yes  Sensation Testing  Intact to touch and monofilament testing bilaterally:  Yes  Pulse Check  Posterior Tibialis and Dorsalis pulse intact bilaterally:  Yes  Comments             Vibration sense is mildly reduced in distal first toes. Motor power grossly normal in all 4 extremities  MUSCULOSKELETAL:  There is no swelling or deformity of the peripheral  joints. Spine is normal to inspection.   EXTREMITIES:     There is no edema. No skin lesions present.Marland Kitchen SKIN:       No rash or lesions of concern.        ASSESSMENT:  Diabetes type 2, uncontrolled with BMI 26   Although he has been on basal insulin for the last year he has had variable control Also on Janumet maximum dose which she has difficulty tolerating because of diarrhea Currently he has made significant changes in his diet and blood sugars in the last week or so are looking excellent including fasting  He has not done enough monitoring after meals to know if he has post prandial hyperglycemia Recently has been more motivated to take care of his diet and reduce carbohydrate which has helped  HYPERTENSION: Mild and well controlled  HYPERLIPIDEMIA: Controlled with 40 mg Lipitor  Complications: Erectile dysfunction,?  Background retinopathy, unknown status of nephropathy  Erectile dysfunction: He has had this for the last few years and has not had adequate response to Viagra 100 mg and 5 mg Cialis.  Currently interested in treatment but does not want to consider prostaglandin injections  PLAN:    Since he has tendency to diarrhea with metformin long-term he will change Janumet to Invokana 100 mg daily. Discussed action of SGLT 2 drugs on lowering glucose by decreasing kidney absorption of glucose, benefits of weight loss and lower blood pressure, possible side effects including candidiasis and dosage regimen  He may need increasing the dose of Invokana for greater efficacy needed; otherwise starting this will hold his lisinopril to prevent hypotension  For now he will continue the same dose of 16 units of Lantus  Discussed needing to check blood sugars more often after meals with some readings after supper also  He will try to improve diet consistently; also advised him to try and add a protein to breakfast which he is not doing and avoid instant oatmeal  Will review blood sugars  again in 3 weeks and adjust his treatment plan as needed  He was started on using the contour glucose monitor since he is using a relatively old monitor, sample meter given and test strips prescribed which will be covered better by his insurance  For erectile dysfunction he will be given a trial of 20 mg Cialis as needed which should be more effective than previous dose of 5 mg  Check urine microalbumin on the next visit  Patient Instructions  Stop Lisinopril and janumet  Check blood sugars on waking up 3-4 times a week Also check blood sugars about 2 hours after a meal  Once daily and do this after different meals by rotation  Recommended blood sugar levels on waking up is 90-130 and about 2 hours after meal is 130-160  Please bring your blood sugar monitor to each visit, thank you     Counseling time on subjects discussed above is over 50% of today's 60 minute visit   Yaslyn Cumby 10/07/2015, 12:57 PM   Note: This office note was prepared with Estate agent. Any transcriptional errors that result from this process are unintentional.

## 2015-10-07 NOTE — Telephone Encounter (Signed)
Per Dr. Dwyane Dee - patient needs to check twice daily. Pharmacy notified. Thanks!

## 2015-10-07 NOTE — Telephone Encounter (Signed)
Pt's pharmacy needs [place] to let them know exactly how many times he will be testing for the test strip rx to be covered by insurance.

## 2015-10-20 ENCOUNTER — Other Ambulatory Visit: Payer: Self-pay | Admitting: Family Medicine

## 2015-10-20 MED FILL — BD PEN NDL MINI 31GX5MM: 31G X 5 MM | 90 days supply | Qty: 100 | Fill #0

## 2015-10-20 MED FILL — LANTUS SOLOSTAR 100 UNITS/M: 100 | 90 days supply | Qty: 15 | Fill #3

## 2015-10-27 ENCOUNTER — Other Ambulatory Visit: Payer: Self-pay | Admitting: Family Medicine

## 2015-10-27 MED FILL — JANUMET 50-1,000 MG TABLET: 50-1000 | 30 days supply | Qty: 60 | Fill #0

## 2015-10-28 ENCOUNTER — Encounter: Payer: Self-pay | Admitting: Family Medicine

## 2015-10-29 ENCOUNTER — Telehealth: Payer: Self-pay | Admitting: Family Medicine

## 2015-10-29 NOTE — Telephone Encounter (Signed)
LVM for pt to return call - to schedule a new pt appt for wife approved by Dr. Charlett Blake

## 2015-11-02 ENCOUNTER — Encounter: Payer: Self-pay | Admitting: Endocrinology

## 2015-11-02 ENCOUNTER — Ambulatory Visit (INDEPENDENT_AMBULATORY_CARE_PROVIDER_SITE_OTHER): Payer: 59 | Admitting: Endocrinology

## 2015-11-02 VITALS — BP 100/60 | HR 100 | Temp 98.7°F | Resp 16 | Ht 75.0 in | Wt 189.6 lb

## 2015-11-02 DIAGNOSIS — E1165 Type 2 diabetes mellitus with hyperglycemia: Secondary | ICD-10-CM

## 2015-11-02 DIAGNOSIS — Z794 Long term (current) use of insulin: Secondary | ICD-10-CM

## 2015-11-02 MED FILL — INVOKANA 100 MG TABLET: 100 | 30 days supply | Qty: 30 | Fill #1

## 2015-11-02 NOTE — Progress Notes (Signed)
[name], male   DOB: 05-18-1956, 60 y.o.   MRN: [id number]           Reason for Appointment:  Follow-up for Type 2 Diabetes   History of Present Illness:          Date of diagnosis of type 2 diabetes mellitus:        Background history:   He thinks his blood sugars at the time of diagnosis was around 500 and he was symptomatic with increased thirst and urination He has been taking metformin for several years but also has been tried on Amaryl Since about 2013 he has been on Januvia In early 2016 his blood sugars were significantly higher with A1c 11.9 and he was started on Lantus insulin, initially 10 units Janumet was continued  Recent history:   INSULIN regimen is: 16 units Lantus in am        He was referred here because his blood sugars were poorly controlled and A1c had gone up to 9% in January   Since he had diarrhea with high dose metformin he was told to change Janumet to Invokana 100 mg daily  Current management, blood sugar patterns and problems identified:   Although he was supposed to stop Janumet he is still taking this but only once a day in the evening.  With this he is not having any diarrhea now except rarely depending on his diet  He has tolerated Invokana without side effects  He has been  Checking his blood sugars mostly in the mornings especially recently   however his blood sugars appear to be fairly consistent throughout the day   HIGHEST blood sugars are fasting with average of 142    with starting Invokana he appears to have less variability in his blood sugars   No recent hypoglycemia, continues to take Lantus in the morning.   His weight has come down  He has been more motivated to watch his diet and continue exercise at the gym  Non-insulin hypoglycemic drugs the patient is taking are: Janumet     Side effects from medications have been:  Compliance with the medical regimen:  Hypoglycemia:    Glucose monitoring:  done 1-2  times a day         Glucometer: One Touch ultra.      Blood Glucose readings by time of day and averages from meter download:  Mean values apply above for all meters except median for One Touch  PRE-MEAL Fasting Lunch Dinner Bedtime Overall  Glucose range:   127-171   98-142   146, 163   111-136   Mean/median:  142    135   Self-care: The diet that the patient has been following is: tries to limit high-fat foods, carbohydrates .     Meal times are:  Breakfast is at 6 AM, Lunch: 12-1 PM Dinner: 6-7 PM   Typical meal intake: Breakfast is  usually instant oatmeal, usually taking lunch from home to work               Dietician visit, most recent: none               Exercise:  At Gym 45 min ;Cardio and weights. 3/7 days a week  Weight history:   Wt Readings from Last 3 Encounters:  11/02/15 189 lb 9.6 oz (86.002 kg)  10/07/15 196 lb (88.905 kg)  08/26/15 194 lb 8 oz (88.225 kg)    Glycemic control:  Lab Results  Component Value Date   HGBA1C 9.0* 08/20/2015   HGBA1C 8.0* 05/13/2015   HGBA1C 7.8* 02/08/2015   Lab Results  Component Value Date   MICROALBUR 2.59* 03/15/2012   LDLCALC 60 08/20/2015   CREATININE 0.75 08/20/2015          Medication List       This list is accurate as of: 11/02/15  9:05 PM.  Always use your most recent med list.               aspirin EC 81 MG tablet  Take 1 tablet (81 mg total) by mouth daily.     atorvastatin 40 MG tablet  Commonly known as:  LIPITOR  TAKE 1 TABLET BY MOUTH DAILY     B-D UF III MINI PEN NEEDLES 31G X 5 MM Misc  Generic drug:  Insulin Pen Needle  USE TO INJECT LANTUS DAILY     canagliflozin 100 MG Tabs tablet  Commonly known as:  INVOKANA  1 tablet before breakfast     fluticasone 50 MCG/ACT nasal spray  Commonly known as:  FLONASE  Place 2 sprays into both nostrils daily.     glucose blood test strip  Commonly known as:  BAYER CONTOUR TEST  Test blood sugars as directed. Dx E11.49     ibuprofen 800 MG  tablet  Commonly known as:  ADVIL,MOTRIN  Take 1 tablet (800 mg total) by mouth 3 (three) times daily.     Insulin Glargine 100 UNIT/ML Solostar Pen  Commonly known as:  LANTUS  Inject 16 Units into the skin daily.     JANUMET 50-1000 MG tablet  Generic drug:  sitaGLIPtin-metformin  TAKE 1 TABLET BY MOUTH 2 TIMES A DAY     lisinopril 10 MG tablet  Commonly known as:  PRINIVIL,ZESTRIL  TAKE 1 TABLET BY MOUTH 2 TIMES DAILY.     loperamide 2 MG tablet  Commonly known as:  IMODIUM A-D  Take 2 mg by mouth 4 (four) times daily as needed for diarrhea or loose stools.     multivitamin with minerals tablet  Take 1 tablet by mouth daily.     mupirocin ointment 2 %  Commonly known as:  BACTROBAN  Place 1 application into the nose 2 (two) times daily.     ONE TOUCH ULTRA SYSTEM KIT w/Device Kit  Use as directed to check blood sugar.  Diagnosis code E11.40     tadalafil 20 MG tablet  Commonly known as:  CIALIS  Take 1 tablet (20 mg total) by mouth daily as needed for erectile dysfunction.        Allergies:  Allergies  Allergen Reactions  . Contrast Media [Iodinated Diagnostic Agents] Swelling    Can take if premedicated with diphenhydramine  . Iodine Swelling    Past Medical History  Diagnosis Date  . Hypertension   . History of chicken pox     childhood  . Seasonal allergies   . History of kidney stones   . 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 Sciatica of right side 12/22/2012  . Insomnia 02/25/2013  . Diverticulosis 10/26/2013  . Colon polyps     adenomatous  . DM (diabetes mellitus), type 2 with neurological complications ([place]) 4/65/6812  . Hyperlipidemia 07/26/2014  . 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 Preventative health care 07/26/2014  . Renal lithiasis 02/21/2015    Past Surgical History  Procedure Laterality  Date  . Hernia repair    . Abdominal hernia repair    . Rotator cuff repair    .  Cholecystectomy    . Mass excision  05/22/2012    Procedure: EXCISION MASS;  Surgeon: Joyice Faster. Cornett, MD;  Location: [place];  Service: General;  Laterality: Left;  excision abdominal wall mass  . Laparoscopic appendectomy N/A 08/16/2014    Procedure: APPENDECTOMY LAPAROSCOPIC;  Surgeon: Erroll Luna, MD;  Location: [facility] OR;  Service: General;  Laterality: N/A;    Family History  Problem Relation Age of Onset  . Lung cancer Mother   . Breast cancer Neg Hx   . Colon cancer Neg Hx   . Prostate cancer Neg Hx   . Heart disease Neg Hx   . Diabetes Maternal Aunt   . Cancer Paternal Grandmother     Social History:  reports that he has never smoked. He has never used smokeless tobacco. He reports that he does not drink alcohol or use illicit drugs.    Review of Systems    Lipid history: On long-term treatment with statin    Lab Results  Component Value Date   CHOL 115 08/20/2015   HDL 34.00* 08/20/2015   LDLCALC 60 08/20/2015   LDLDIRECT 109.0 10/26/2014   TRIG 107.0 08/20/2015   CHOLHDL 3 08/20/2015           Hypertension: Mild and taking only 10 mg lisinopril  Most recent eye exam was 1/17  Most recent foot exam: 2/17  Review of Systems  Endocrine: Positive for erectile dysfunction.       Recently given a trial of Cialis 20 mg, he says this is helping slightly      LABS:  No visits with results within 1 Week(s) from this visit. Latest known visit with results is:  Abstract on 09/14/2015  Component Date Value Ref Range Status  . HM Diabetic Eye Exam 09/03/2015 No Retinopathy  No Retinopathy Final   [facility].    Physical Examination:  BP 100/60 mmHg  Pulse 100  Temp(Src) 98.7 F (37.1 C)  Resp 16  Ht '6\' 3"'$  (1.905 m)  Wt 189 lb 9.6 oz (86.002 kg)  BMI 23.70 kg/m2  SpO2 97%     ASSESSMENT:  Diabetes type 2, uncontrolled with normal  BMI See history of present illness for detailed discussion of current diabetes management,  blood sugar patterns and problems identified His blood sugars are significant it better with starting Invokana He is still taking Janumet once a day in the evening and this is causing less diarrhea.  No side effects or change in blood pressure with Invokana He is having relatively higher readings fasting compared to the evening and this may be related to his taking Lantus in the morning and insulin not lasting 24 hours Overall compliance with diet and exercise as well as motivation and is continuing to improve  Erectile dysfunction: He has Some improvement with Cialis 20 mg  PLAN:      Change Lantus to the morning  With next prescription he can change Janumet to Janumet XR 100/1000 once a day  No change in Invokana   he will get a meal plan from the dietitian with the  [facility].    Recheck A1c on the next visit  Patient Instructions  Lantus 16 in pm   Check blood sugars on waking up 2-3  times a week Also check blood sugars about 2 hours after a  meal and do this after different meals by rotation  Recommended blood sugar levels on waking up is 90-130 and about 2 hours after meal is 130-160  Please bring your blood sugar monitor to each visit, thank you  Will need to use Janumet XR 100/1000 next Rx  See dietician        [facility] 11/02/2015, 9:05 PM   Note: This office note was prepared with Dragon voice recognition system technology. Any transcriptional errors that result from this process are unintentional.

## 2015-11-02 NOTE — Patient Instructions (Signed)
Lantus 16 in pm   Check blood sugars on waking up 2-3  times a week Also check blood sugars about 2 hours after a meal and do this after different meals by rotation  Recommended blood sugar levels on waking up is 90-130 and about 2 hours after meal is 130-160  Please bring your blood sugar monitor to each visit, thank you  Will need to use Janumet XR 100/1000 next Rx  See dietician

## 2015-11-10 MED FILL — CIALIS 20 MG TABLET: 20 | 30 days supply | Qty: 6 | Fill #1

## 2015-11-12 ENCOUNTER — Encounter: Payer: Self-pay | Admitting: Dietician

## 2015-11-12 ENCOUNTER — Telehealth: Payer: Self-pay | Admitting: *Deleted

## 2015-11-12 ENCOUNTER — Encounter: Payer: 59 | Attending: Endocrinology | Admitting: Dietician

## 2015-11-12 VITALS — Ht 72.0 in | Wt 193.0 lb

## 2015-11-12 DIAGNOSIS — E118 Type 2 diabetes mellitus with unspecified complications: Secondary | ICD-10-CM

## 2015-11-12 DIAGNOSIS — E1165 Type 2 diabetes mellitus with hyperglycemia: Secondary | ICD-10-CM

## 2015-11-12 DIAGNOSIS — E1149 Type 2 diabetes mellitus with other diabetic neurological complication: Secondary | ICD-10-CM | POA: Diagnosis not present

## 2015-11-12 NOTE — Patient Instructions (Signed)
Plan:  Aim for 4 Carb Choices per meal (60 grams) +/- 1 either way  Aim for 0-2 Carbs per snack if hungry  Include protein in moderation with your meals and snacks Consider reading food labels for Total Carbohydrate and Fat Grams of foods Be active every day.  Great job on your activity. Consider checking BG at alternate times per day as directed by MD  Consider taking medication as directed by MD

## 2015-11-12 NOTE — Progress Notes (Signed)
Diabetes Self-Management Education  Visit Type: First/Initial  Appt. Start Time: 1030 Appt. End Time: 1130  11/12/2015  [name], identified by name and date of birth, is a 60 y.o. male with a diagnosis of Diabetes: Type 2. He was diagnosed in 1996 and weighed 335 lbs at that time.  By [id number] he had reduced to 160 lbs.  (130 lbs when younger).  He checks his blood sugar 2-3 times per day.  It was 143 this am and decreases as the day goes on and HS is 120-135.  He had a recent eye exam which showed retinopathy.  He checks his feet daily.  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  He lives with his wife.  She shops and cooks.  Eats out at times.  He tries not to eat after 6 pm.  He walks all day on his job and registered 100,000 steps on one occasion.  He is taking a nutritional supplement as well called Vemma.  Diet hx: Breakfast:  1 cup oatmeal or oatmeal with protein pwoder and blueberries OR eggs, bacon, potatoes Snack:  Boiled egg, fresh fruit Lunch:  Soup or leftovers or peanut butter and pineapple sandwich Snack:  Fresh fruit, boiled egg Dinner [place] (steak, veges, chips, salsa and sometimes tortillas) or BBQ sandwich OR garden salad OR home:  Small steak, potatoes, and corn Snack:  None Beverages:  Water, 12 ounces of regular cheer wine 3-4 times per week, coffee with 1 tsp honey, cinnamon, and coconut oil.  Exercise:  With his job he has tracked up to 100,000 steps on a 12 hour shift.  He also uses the weights and bag at [facility] 2 times per week for 1 hour.  He states that he notices better improvement in his blood sugar when he uses the weights.  ASSESSMENT  Height 6' (1.829 m), weight 193 lb (87.544 kg). Body mass index is 26.17 kg/(m^2).      Diabetes Self-Management Education - 11/12/15 1139    Visit Information   Visit Type First/Initial      Individualized Plan for Diabetes Self-Management Training:   Learning Objective:  Patient will  have a greater understanding of diabetes self-management. Patient education plan is to attend individual and/or group sessions per assessed needs and concerns.   Plan:   Patient Instructions  Plan:  Aim for 4 Carb Choices per meal (60 grams) +/- 1 either way  Aim for 0-2 Carbs per snack if hungry  Include protein in moderation with your meals and snacks Consider reading food labels for Total Carbohydrate and Fat Grams of foods Be active every day.  Great job on your activity. Consider checking BG at alternate times per day as directed by MD  Consider taking medication as directed by MD     Expected Outcomes:  Demonstrated interest in learning. Expect positive outcomes  Education material provided: Meal plan card and My Plate  If problems or questions, patient to contact team via:  Phone and Email  Future DSME appointment: PRN

## 2015-11-12 NOTE — Telephone Encounter (Signed)
Received request for Medical Records [workman's comp]; forwarded to [place] for email/scan//SLS 03/31

## 2015-11-23 MED FILL — JANUMET 50-1,000 MG TABLET: 50-1000 | 30 days supply | Qty: 60 | Fill #1

## 2015-11-24 ENCOUNTER — Other Ambulatory Visit (INDEPENDENT_AMBULATORY_CARE_PROVIDER_SITE_OTHER): Payer: 59

## 2015-11-24 DIAGNOSIS — E1149 Type 2 diabetes mellitus with other diabetic neurological complication: Secondary | ICD-10-CM | POA: Diagnosis not present

## 2015-11-24 DIAGNOSIS — E785 Hyperlipidemia, unspecified: Secondary | ICD-10-CM

## 2015-11-24 DIAGNOSIS — I10 Essential (primary) hypertension: Secondary | ICD-10-CM | POA: Diagnosis not present

## 2015-11-24 LAB — COMPREHENSIVE METABOLIC PANEL
ALK PHOS: 53 U/L (ref 39–117)
ALT: 17 U/L (ref 0–53)
AST: 17 U/L (ref 0–37)
Albumin: 4.4 g/dL (ref 3.5–5.2)
BILIRUBIN TOTAL: 0.6 mg/dL (ref 0.2–1.2)
BUN: 19 mg/dL (ref 6–23)
CO2: 26 mEq/L (ref 19–32)
CREATININE: 0.94 mg/dL (ref 0.40–1.50)
Calcium: 9.8 mg/dL (ref 8.4–10.5)
Chloride: 100 mEq/L (ref 96–112)
GFR: 87.75 mL/min (ref >60.00)
GLUCOSE: 105 mg/dL — AB (ref 70–99)
Potassium: 3.8 mEq/L (ref 3.5–5.1)
Sodium: 134 mEq/L — ABNORMAL LOW (ref 135–145)
TOTAL PROTEIN: 7.7 g/dL (ref 6.0–8.3)

## 2015-11-24 LAB — LIPID PANEL
CHOL/HDL RATIO: 3
Cholesterol: 100 mg/dL (ref 0–200)
HDL: 33.4 mg/dL — AB (ref >39.00)
LDL Cholesterol: 42 mg/dL (ref 0–99)
NONHDL: 66.43
Triglycerides: 120 mg/dL (ref 0.0–149.0)
VLDL: 24 mg/dL (ref 0.0–40.0)

## 2015-11-24 LAB — CBC
HCT: 42.9 % (ref 39.0–52.0)
HEMOGLOBIN: 14.4 g/dL (ref 13.0–17.0)
MCHC: 33.6 g/dL (ref 30.0–36.0)
MCV: 89.3 fl (ref 78.0–100.0)
PLATELETS: 233 10*3/uL (ref 150.0–400.0)
RBC: 4.8 Mil/uL (ref 4.22–5.81)
RDW: 14.3 % (ref 11.5–15.5)
WBC: 7.2 10*3/uL (ref 4.0–10.5)

## 2015-11-24 LAB — MICROALBUMIN / CREATININE URINE RATIO
CREATININE, U: 75.7 mg/dL
MICROALB/CREAT RATIO: 0.9 mg/g (ref 0.0–30.0)

## 2015-11-24 LAB — TSH: TSH: 2.04 u[IU]/mL (ref 0.35–4.50)

## 2015-11-24 LAB — HEMOGLOBIN A1C: HEMOGLOBIN A1C: 7.4 % — AB (ref 4.6–6.5)

## 2015-11-25 ENCOUNTER — Other Ambulatory Visit: Payer: Self-pay

## 2015-11-26 ENCOUNTER — Other Ambulatory Visit: Payer: Self-pay

## 2015-11-30 ENCOUNTER — Encounter: Payer: Self-pay | Admitting: Family Medicine

## 2015-11-30 ENCOUNTER — Ambulatory Visit (INDEPENDENT_AMBULATORY_CARE_PROVIDER_SITE_OTHER): Payer: 59 | Admitting: Family Medicine

## 2015-11-30 VITALS — BP 102/64 | HR 85 | Temp 98.0°F | Ht 72.0 in | Wt 188.1 lb

## 2015-11-30 DIAGNOSIS — M5416 Radiculopathy, lumbar region: Secondary | ICD-10-CM | POA: Diagnosis not present

## 2015-11-30 DIAGNOSIS — E1149 Type 2 diabetes mellitus with other diabetic neurological complication: Secondary | ICD-10-CM

## 2015-11-30 DIAGNOSIS — I10 Essential (primary) hypertension: Secondary | ICD-10-CM

## 2015-11-30 DIAGNOSIS — M5431 Sciatica, right side: Secondary | ICD-10-CM

## 2015-11-30 DIAGNOSIS — E785 Hyperlipidemia, unspecified: Secondary | ICD-10-CM

## 2015-11-30 DIAGNOSIS — R0789 Other chest pain: Secondary | ICD-10-CM

## 2015-11-30 DIAGNOSIS — K5790 Diverticulosis of intestine, part unspecified, without perforation or abscess without bleeding: Secondary | ICD-10-CM

## 2015-11-30 DIAGNOSIS — R197 Diarrhea, unspecified: Secondary | ICD-10-CM

## 2015-11-30 MED FILL — INVOKANA 100 MG TABLET: 100 | 30 days supply | Qty: 30 | Fill #2

## 2015-11-30 NOTE — Assessment & Plan Note (Signed)
Encouraged moist heat and gentle stretching as tolerated. May try NSAIDs and prescription meds as directed and report if symptoms worsen or seek immediate care 

## 2015-11-30 NOTE — Assessment & Plan Note (Addendum)
hgba1c acceptable, minimize simple carbs. Increase exercise as tolerated. Continue current meds. Is improving with medication changes with endocrinology. Doing better on Invokana and has moved Insulin to evening. Now only one Janumet daily. Is exercising more and eating better. Still struggles with fatigue but feels better overall

## 2015-11-30 NOTE — Progress Notes (Signed)
[name], male   DOB: 13-Sep-1955, 60 y.o.   MRN: [id number]   Subjective:    [name], male    DOB: 04-01-1956, 60 y.o.   MRN: [id number]  Chief Complaint  Patient presents with  . Follow-up    HPI Patient is in today for follow up. He struggles with persistent fatigue and aches and pains but over all is doing better. He has decreased his carbohydrate intake and increased his exercise. He has been following with endocrinology and has moved his insulin dosing to qhs and is tolerating the addition of Invokana. He reports his morning sugars have been ranging from 80 to 100 most of the time. Denies CP/palp/SOB/HA/congestion/fevers/GI or GU c/o. Taking meds as prescribed  Past Medical History  Diagnosis Date  . Hypertension   . History of chicken pox     childhood  . Seasonal allergies   . History of kidney stones   . 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 Sciatica of right side 12/22/2012  . Insomnia 02/25/2013  . Diverticulosis 10/26/2013  . Colon polyps     adenomatous  . DM (diabetes mellitus), type 2 with neurological complications ([place]) 0/88/1103  . Hyperlipidemia 07/26/2014  . 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 Preventative health care 07/26/2014  . Renal lithiasis 02/21/2015    Past Surgical History  Procedure Laterality Date  . Hernia repair    . Abdominal hernia repair    . Rotator cuff repair    . Cholecystectomy    . Mass excision  05/22/2012    Procedure: EXCISION MASS;  Surgeon: Joyice Faster. Cornett, MD;  Location: [place];  Service: General;  Laterality: Left;  excision abdominal wall mass  . Laparoscopic appendectomy N/A 08/16/2014    Procedure: APPENDECTOMY LAPAROSCOPIC;  Surgeon: Erroll Luna, MD;  [facility];  Service: General;  Laterality: N/A;    Family History  Problem Relation Age of Onset  . Lung cancer Mother   . Breast cancer Neg Hx   .  Colon cancer Neg Hx   . Prostate cancer Neg Hx   . Heart disease Neg Hx   . Diabetes Maternal Aunt   . Cancer Paternal Grandmother     Social History   Social History  . Marital Status: Married    Spouse Name: N/A  . Number of Children: 3  . Years of Education: N/A   Occupational History  .      Security officer [employer]   Social History Main Topics  . Smoking status: Never Smoker   . Smokeless tobacco: Never Used  . Alcohol Use: No  . Drug Use: No  . Sexual Activity: Not on file   Other Topics Concern  . Not on file   Social History Narrative    Outpatient Prescriptions Prior to Visit  Medication Sig Dispense Refill  . aspirin EC 81 MG tablet Take 1 tablet (81 mg total) by mouth daily.    Marland Kitchen atorvastatin (LIPITOR) 40 MG tablet TAKE 1 TABLET BY MOUTH DAILY 90 tablet 1  . B-D UF III MINI PEN NEEDLES 31G X 5 MM MISC USE TO INJECT LANTUS DAILY 100 each 2  . Blood Glucose Monitoring Suppl (ONE TOUCH ULTRA SYSTEM KIT) W/DEVICE KIT Use as directed to check blood sugar.  Diagnosis code E11.40    . canagliflozin (INVOKANA) 100 MG TABS tablet 1 tablet before breakfast 30 tablet  3  . fluticasone (FLONASE) 50 MCG/ACT nasal spray Place 2 sprays into both nostrils daily. 16 g 1  . glucose blood (BAYER CONTOUR TEST) test strip Test blood sugars as directed. Dx E11.49 100 each 12  . ibuprofen (ADVIL,MOTRIN) 800 MG tablet Take 1 tablet (800 mg total) by mouth 3 (three) times daily. 21 tablet 0  . Insulin Glargine (LANTUS) 100 UNIT/ML Solostar Pen Inject 16 Units into the skin daily. 15 mL 6  . JANUMET 50-1000 MG tablet TAKE 1 TABLET BY MOUTH 2 TIMES A DAY 60 tablet 5  . lisinopril (PRINIVIL,ZESTRIL) 10 MG tablet TAKE 1 TABLET BY MOUTH 2 TIMES DAILY. 180 tablet 3  . loperamide (IMODIUM A-D) 2 MG tablet Take 2 mg by mouth 4 (four) times daily as needed for diarrhea or loose stools.    . Multiple Vitamins-Minerals (MULTIVITAMIN WITH MINERALS) tablet Take 1 tablet by mouth daily.    . mupirocin  ointment (BACTROBAN) 2 % Place 1 application into the nose 2 (two) times daily. 22 g 0  . tadalafil (CIALIS) 20 MG tablet Take 1 tablet (20 mg total) by mouth daily as needed for erectile dysfunction. 10 tablet 3   No facility-administered medications prior to visit.    Allergies  Allergen Reactions  . Contrast Media [Iodinated Diagnostic Agents] Swelling    Can take if premedicated with diphenhydramine  . Iodine Swelling    Review of Systems  Constitutional: Positive for malaise/fatigue. Negative for chills.  HENT: Negative for congestion and hearing loss.   Eyes: Negative for discharge.  Respiratory: Negative for cough, sputum production and shortness of breath.   Cardiovascular: Negative for chest pain, palpitations and leg swelling.  Gastrointestinal: Negative for heartburn, nausea, vomiting, abdominal pain, diarrhea, constipation and blood in stool.  Genitourinary: Negative for dysuria, urgency and frequency.  Musculoskeletal: Negative for myalgias, back pain and falls.  Skin: Negative for rash.  Neurological: Negative for dizziness, sensory change, loss of consciousness, weakness and headaches.  Endo/Heme/Allergies: Negative for environmental allergies.  Psychiatric/Behavioral: Negative for depression and suicidal ideas. The patient is not nervous/anxious and does not have insomnia.        Objective:    Physical Exam  Constitutional: He is oriented to person, place, and time. He appears well-developed and well-nourished. No distress.  HENT:  Head: Normocephalic and atraumatic.  Nose: Nose normal.  Eyes: Right eye exhibits no discharge. Left eye exhibits no discharge.  Neck: Normal range of motion. Neck supple.  Cardiovascular: Normal rate and regular rhythm.   No murmur heard. Pulmonary/Chest: Effort normal and breath sounds normal.  Abdominal: Soft. Bowel sounds are normal. There is no tenderness.  Musculoskeletal: He exhibits no edema.  Neurological: He is alert and  oriented to person, place, and time.  Skin: Skin is warm and dry.  Psychiatric: He has a normal mood and affect.  Nursing note and vitals reviewed.   BP 102/64 mmHg  Pulse 85  Temp(Src) 98 F (36.7 C) (Oral)  Ht 6' (1.829 m)  Wt 188 lb 2 oz (85.333 kg)  BMI 25.51 kg/m2  SpO2 97% Wt Readings from Last 3 Encounters:  11/30/15 188 lb 2 oz (85.333 kg)  11/12/15 193 lb (87.544 kg)  11/02/15 189 lb 9.6 oz (86.002 kg)     Lab Results  Component Value Date   WBC 7.2 11/24/2015   HGB 14.4 11/24/2015   HCT 42.9 11/24/2015   PLT 233.0 11/24/2015   GLUCOSE 105* 11/24/2015   CHOL 100 11/24/2015   TRIG  120.0 11/24/2015   HDL 33.40* 11/24/2015   LDLDIRECT 109.0 10/26/2014   LDLCALC 42 11/24/2015   ALT 17 11/24/2015   AST 17 11/24/2015   NA 134* 11/24/2015   K 3.8 11/24/2015   CL 100 11/24/2015   CREATININE 0.94 11/24/2015   BUN 19 11/24/2015   CO2 26 11/24/2015   TSH 2.04 11/24/2015   PSA 0.64 09/12/2013   INR 1.10 08/16/2014   HGBA1C 7.4* 11/24/2015   MICROALBUR <0.7 11/24/2015    Lab Results  Component Value Date   TSH 2.04 11/24/2015   Lab Results  Component Value Date   WBC 7.2 11/24/2015   HGB 14.4 11/24/2015   HCT 42.9 11/24/2015   MCV 89.3 11/24/2015   PLT 233.0 11/24/2015   Lab Results  Component Value Date   NA 134* 11/24/2015   K 3.8 11/24/2015   CO2 26 11/24/2015   GLUCOSE 105* 11/24/2015   BUN 19 11/24/2015   CREATININE 0.94 11/24/2015   BILITOT 0.6 11/24/2015   ALKPHOS 53 11/24/2015   AST 17 11/24/2015   ALT 17 11/24/2015   PROT 7.7 11/24/2015   ALBUMIN 4.4 11/24/2015   CALCIUM 9.8 11/24/2015   ANIONGAP 11 01/31/2015   GFR 87.75 11/24/2015   Lab Results  Component Value Date   CHOL 100 11/24/2015   Lab Results  Component Value Date   HDL 33.40* 11/24/2015   Lab Results  Component Value Date   LDLCALC 42 11/24/2015   Lab Results  Component Value Date   TRIG 120.0 11/24/2015   Lab Results  Component Value Date   CHOLHDL 3  11/24/2015   Lab Results  Component Value Date   HGBA1C 7.4* 11/24/2015       Assessment & Plan:   Problem List Items Addressed This Visit    Chest pain    No c/o      Relevant Orders   TSH   CBC   Hemoglobin A1c   Comprehensive metabolic panel   Lipid panel   RESOLVED: Diarrhea    No blood,  No recent flares, usually when he eats something he knows he should not.      Relevant Orders   TSH   CBC   Hemoglobin A1c   Comprehensive metabolic panel   Lipid panel   Diverticulosis    Needs to maintatin hi fiber intake, increased fluid intake and daily probiotics.       DM (diabetes mellitus), type 2 with neurological complications ([place]) - Primary    hgba1c acceptable, minimize simple carbs. Increase exercise as tolerated. Continue current meds. Is improving with medication changes with endocrinology. Doing better on Invokana and has moved Insulin to evening. Now only one Janumet daily. Is exercising more and eating better. Still struggles with fatigue but feels better overall      Relevant Orders   TSH   CBC   Hemoglobin A1c   Comprehensive metabolic panel   Lipid panel   HTN (hypertension)    Well controlled, no changes to meds. Encouraged heart healthy diet such as the DASH diet and exercise as tolerated.       Hyperlipidemia    Tolerating statin, encouraged heart healthy diet, avoid trans fats, minimize simple carbs and saturated fats. Increase exercise as tolerated      Relevant Orders   TSH   CBC   Hemoglobin A1c   Comprehensive metabolic panel   Lipid panel   Lumbar radiculopathy    Encouraged moist heat and gentle stretching as  tolerated. May try NSAIDs and prescription meds as directed and report if symptoms worsen or seek immediate care      Relevant Orders   TSH   CBC   Hemoglobin A1c   Comprehensive metabolic panel   Lipid panel   Sciatica of right side    No recent flares responds to conservative measures stretching and exercise       Relevant Orders   TSH   CBC   Hemoglobin A1c   Comprehensive metabolic panel   Lipid panel      I am having Mr. Montagna maintain his loperamide, aspirin EC, ONE TOUCH ULTRA SYSTEM KIT, multivitamin with minerals, lisinopril, ibuprofen, fluticasone, Insulin Glargine, mupirocin ointment, atorvastatin, glucose blood, canagliflozin, tadalafil, B-D UF III MINI PEN NEEDLES, and JANUMET.  No orders of the defined types were placed in this encounter.     [name], MD

## 2015-11-30 NOTE — Patient Instructions (Signed)

## 2015-11-30 NOTE — Assessment & Plan Note (Signed)
Tolerating statin, encouraged heart healthy diet, avoid trans fats, minimize simple carbs and saturated fats. Increase exercise as tolerated 

## 2015-11-30 NOTE — Assessment & Plan Note (Signed)
No recent flares responds to conservative measures stretching and exercise

## 2015-11-30 NOTE — Assessment & Plan Note (Addendum)
No blood,  No recent flares, usually when he eats something he knows he should not.

## 2015-11-30 NOTE — Progress Notes (Signed)
Pre visit review using our clinic review tool, if applicable. No additional management support is needed unless otherwise documented below in the visit note. 

## 2015-11-30 NOTE — Assessment & Plan Note (Signed)
No c/o

## 2015-12-09 MED FILL — CIALIS 20 MG TABLET: 20 | 30 days supply | Qty: 6 | Fill #2

## 2015-12-12 NOTE — Assessment & Plan Note (Signed)
Well controlled, no changes to meds. Encouraged heart healthy diet such as the DASH diet and exercise as tolerated.  °

## 2015-12-12 NOTE — Assessment & Plan Note (Signed)
Needs to maintatin hi fiber intake, increased fluid intake and daily probiotics.

## 2015-12-15 MED FILL — ATORVASTATIN 40 MG TABLET: 40 | 90 days supply | Qty: 90 | Fill #1

## 2015-12-15 MED FILL — LISINOPRIL 10 MG TABLET: 10 | 90 days supply | Qty: 180 | Fill #3

## 2015-12-22 MED FILL — JANUMET 50-1,000 MG TABLET: 50-1000 | 30 days supply | Qty: 60 | Fill #2

## 2015-12-29 MED FILL — INVOKANA 100 MG TABLET: 100 | 30 days supply | Qty: 30 | Fill #3

## 2016-01-03 ENCOUNTER — Encounter: Payer: Self-pay | Admitting: Family Medicine

## 2016-01-04 ENCOUNTER — Ambulatory Visit (INDEPENDENT_AMBULATORY_CARE_PROVIDER_SITE_OTHER): Payer: 59 | Admitting: Endocrinology

## 2016-01-04 ENCOUNTER — Encounter: Payer: Self-pay | Admitting: Endocrinology

## 2016-01-04 VITALS — BP 110/76 | HR 95 | Temp 98.2°F | Resp 16 | Ht 72.0 in | Wt 193.6 lb

## 2016-01-04 DIAGNOSIS — Z794 Long term (current) use of insulin: Secondary | ICD-10-CM

## 2016-01-04 DIAGNOSIS — E1165 Type 2 diabetes mellitus with hyperglycemia: Secondary | ICD-10-CM

## 2016-01-04 MED ORDER — SITAGLIP PHOS-METFORMIN HCL ER 100-1000 MG PO TB24: ORAL_TABLET | ORAL | Status: DC

## 2016-01-04 MED FILL — JANUMET XR 100-1,000 MG TAB: 100-1000 | 90 days supply | Qty: 90 | Fill #0

## 2016-01-04 NOTE — Progress Notes (Signed)
[name], male   DOB: 25-Dec-1955, 60 y.o.   MRN: [id number]           Reason for Appointment:  Follow-up for Type 2 Diabetes   History of Present Illness:          Date of diagnosis of type 2 diabetes mellitus:        Background history:   Albert [name] thinks Albert [name] blood sugars at the time of diagnosis was around 500 and Albert [name] was symptomatic with increased thirst and urination Albert [name] has been taking metformin for several years but also has been tried on Amaryl Since about 2013 Albert [name] has been on Januvia In early 2016 Albert [name] blood sugars were significantly higher with A1c 11.9 and Albert [name] was started on Lantus insulin, initially 10 units Janumet was continued  Recent history:   INSULIN regimen is: 16 units Lantus hs     Prior to consultation Albert [name] A1c had gone up to 9% in January  Albert [name] was started on Invokana 100 mg daily Also since Albert [name] fasting readings are high in 3/17 Albert [name] Lantus was changed to nighttime instead of morning Albert [name] did not bring Albert [name] meter today for download  Current management, blood sugar patterns and problems identified:   Albert [name] thinks Albert [name] blood sugars are much better now and fasting readings are improved with switching Lantus of the evening  Also no tendency to hypoglycemia during the day even when Albert [name] is more active  Not clear if Albert [name] is checking readings after meals, sometimes will check it about 30 minutes after eating supper  However Albert [name] does not report readings over about 140  Weight is stable, difficult to assess today since Albert [name] is wearing a lot of equipment  Albert [name] has been still motivated to watch Albert [name] diet and continue exercise at the gym  Albert [name] has seen the dietitian and is doing better with Albert [name] choices especially breakfast  Non-insulin hypoglycemic drugs the patient is taking are: Janumet 50/1000 at supper      Side effects from medications have been: Diarrhea from high dose metformin  Compliance with the medical regimen: Good   Glucose monitoring:  done 1-2 times a day          Glucometer: One Touch ultra.      Blood Glucose readings by recall  Mean values apply above for all meters except median for One Touch  PRE-MEAL Fasting Lunch Dinner Bedtime Overall  Glucose range: 105-125  112-120    Mean/median:        POST-MEAL PC Breakfast PC Lunch PC Dinner  Glucose range:   135-140  Mean/median:       Self-care: The diet that the patient has been following is: tries to limit high-fat foods, carbohydrates .     Meal times are:  Breakfast is at 6 AM, Lunch: 12-1 PM Dinner: 6-7 PM   Typical meal intake: Breakfast is  usually  oatmeal, usually taking lunch from home to work               Dietician visit, most recent: 11/2015               Exercise:  At Gym 45 min ;Cardio and weights. 3/7 days a week  Weight history:   Wt Readings from Last 3 Encounters:  01/04/16 193 lb 9.6 oz (87.816 kg)  11/30/15 188 lb 2 oz (85.333 kg)  11/12/15 193 lb (87.544 kg)    Glycemic control:   Lab Results  Component Value Date  HGBA1C 7.4* 11/24/2015   HGBA1C 9.0* 08/20/2015   HGBA1C 8.0* 05/13/2015   Lab Results  Component Value Date   MICROALBUR <0.7 11/24/2015   LDLCALC 42 11/24/2015   CREATININE 0.94 11/24/2015          Medication List       This list is accurate as of: 01/04/16 10:32 AM.  Always use your most recent med list.               aspirin EC 81 MG tablet  Take 1 tablet (81 mg total) by mouth daily.     atorvastatin 40 MG tablet  Commonly known as:  LIPITOR  TAKE 1 TABLET BY MOUTH DAILY     B-D UF III MINI PEN NEEDLES 31G X 5 MM Misc  Generic drug:  Insulin Pen Needle  USE TO INJECT LANTUS DAILY     canagliflozin 100 MG Tabs tablet  Commonly known as:  INVOKANA  1 tablet before breakfast     fluticasone 50 MCG/ACT nasal spray  Commonly known as:  FLONASE  Place 2 sprays into both nostrils daily.     glucose blood test strip  Commonly known as:  BAYER CONTOUR TEST  Test blood sugars as directed. Dx E11.49     ibuprofen 800 MG  tablet  Commonly known as:  ADVIL,MOTRIN  Take 1 tablet (800 mg total) by mouth 3 (three) times daily.     Insulin Glargine 100 UNIT/ML Solostar Pen  Commonly known as:  LANTUS  Inject 16 Units into the skin daily.     lisinopril 10 MG tablet  Commonly known as:  PRINIVIL,ZESTRIL  TAKE 1 TABLET BY MOUTH 2 TIMES DAILY.     loperamide 2 MG tablet  Commonly known as:  IMODIUM A-D  Take 2 mg by mouth 4 (four) times daily as needed for diarrhea or loose stools.     multivitamin with minerals tablet  Take 1 tablet by mouth daily.     mupirocin ointment 2 %  Commonly known as:  BACTROBAN  Place 1 application into the nose 2 (two) times daily.     ONE TOUCH ULTRA SYSTEM KIT w/Device Kit  Use as directed to check blood sugar.  Diagnosis code E11.40     SitaGLIPtin-MetFORMIN HCl [phone] MG Tb24  Commonly known as:  JANUMET XR  1 tab pc supper     tadalafil 20 MG tablet  Commonly known as:  CIALIS  Take 1 tablet (20 mg total) by mouth daily as needed for erectile dysfunction.        Allergies:  Allergies  Allergen Reactions  . Contrast Media [Iodinated Diagnostic Agents] Swelling    Can take if premedicated with diphenhydramine  . Iodine Swelling    Past Medical History  Diagnosis Date  . Hypertension   . History of chicken pox     childhood  . Seasonal allergies   . History of kidney stones   . 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 Sciatica of right side 12/22/2012  . Insomnia 02/25/2013  . Diverticulosis 10/26/2013  . Colon polyps     adenomatous  . DM (diabetes mellitus), type 2 with neurological complications ([place]) 11/05/4980  . Hyperlipidemia 07/26/2014  . 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 Preventative health care 07/26/2014  . Renal lithiasis 02/21/2015    Past Surgical History  Procedure Laterality Date  . Hernia repair    . Abdominal hernia  repair    . Rotator cuff repair    .  Cholecystectomy    . Mass excision  05/22/2012    Procedure: EXCISION MASS;  Surgeon: Joyice Faster. Cornett, MD;  Location: [facility];  Service: General;  Laterality: Left;  excision abdominal wall mass  . Laparoscopic appendectomy N/A 08/16/2014    Procedure: APPENDECTOMY LAPAROSCOPIC;  Surgeon: Erroll Luna, MD;  [facility];  Service: General;  Laterality: N/A;    Family History  Problem Relation Age of Onset  . Lung cancer Mother   . Breast cancer Neg Hx   . Colon cancer Neg Hx   . Prostate cancer Neg Hx   . Heart disease Neg Hx   . Diabetes Maternal Aunt   . Cancer Paternal Grandmother     Social History:  reports that Albert [name] has never smoked. Albert [name] has never used smokeless tobacco. Albert [name] reports that Albert [name] does not drink alcohol or use illicit drugs.    Review of Systems    Lipid history: On long-term treatment with statin, Followed by PCP    Lab Results  Component Value Date   CHOL 100 11/24/2015   HDL 33.40* 11/24/2015   LDLCALC 42 11/24/2015   LDLDIRECT 109.0 10/26/2014   TRIG 120.0 11/24/2015   CHOLHDL 3 11/24/2015           Hypertension: Mild and taking only 10 mg lisinoprilOnce a day now  Most recent eye exam was 1/17  Most recent foot exam: 2/17  Review of Systems      LABS:  No visits with results within 1 Week(s) from this visit. Latest known visit with results is:  Lab on 11/24/2015  Component Date Value Ref Range Status  . TSH 11/24/2015 2.04  0.35 - 4.50 uIU/mL Final  . WBC 11/24/2015 7.2  4.0 - 10.5 K/uL Final  . RBC 11/24/2015 4.80  4.22 - 5.81 Mil/uL Final  . Platelets 11/24/2015 233.0  150.0 - 400.0 K/uL Final  . Hemoglobin 11/24/2015 14.4  13.0 - 17.0 g/dL Final  . HCT 11/24/2015 42.9  39.0 - 52.0 % Final  . MCV 11/24/2015 89.3  78.0 - 100.0 fl Final  . MCHC 11/24/2015 33.6  30.0 - 36.0 g/dL Final  . RDW 11/24/2015 14.3  11.5 - 15.5 % Final  . Sodium 11/24/2015 134* 135 - 145 mEq/L Final  . Potassium 11/24/2015 3.8  3.5 - 5.1  mEq/L Final  . Chloride 11/24/2015 100  96 - 112 mEq/L Final  . CO2 11/24/2015 26  19 - 32 mEq/L Final  . Glucose, Bld 11/24/2015 105* 70 - 99 mg/dL Final  . BUN 11/24/2015 19  6 - 23 mg/dL Final  . Creatinine, Ser 11/24/2015 0.94  0.40 - 1.50 mg/dL Final  . Total Bilirubin 11/24/2015 0.6  0.2 - 1.2 mg/dL Final  . Alkaline Phosphatase 11/24/2015 53  39 - 117 U/L Final  . AST 11/24/2015 17  0 - 37 U/L Final  . ALT 11/24/2015 17  0 - 53 U/L Final  . Total Protein 11/24/2015 7.7  6.0 - 8.3 g/dL Final  . Albumin 11/24/2015 4.4  3.5 - 5.2 g/dL Final  . Calcium 11/24/2015 9.8  8.4 - 10.5 mg/dL Final  . GFR 11/24/2015 87.75  >60.00 mL/min Final  . Cholesterol 11/24/2015 100  0 - 200 mg/dL Final   ATP III Classification       Desirable:  < 200 mg/dL  Borderline High:  200 - 239 mg/dL          High:  > = 240 mg/dL  . Triglycerides 11/24/2015 120.0  0.0 - 149.0 mg/dL Final   Normal:  <150 mg/dLBorderline High:  150 - 199 mg/dL  . HDL 11/24/2015 33.40* >39.00 mg/dL Final  . VLDL 11/24/2015 24.0  0.0 - 40.0 mg/dL Final  . LDL Cholesterol 11/24/2015 42  0 - 99 mg/dL Final  . Total CHOL/HDL Ratio 11/24/2015 3   Final                  Men          Women1/2 Average Risk     3.4          3.3Average Risk          5.0          4.42X Average Risk          9.6          7.13X Average Risk          15.0          11.0                      . NonHDL 11/24/2015 66.43   Final   NOTE:  Non-HDL goal should be 30 mg/dL higher than patient's LDL goal (i.e. LDL goal of < 70 mg/dL, would have non-HDL goal of < 100 mg/dL)  . Hgb A1c MFr Bld 11/24/2015 7.4* 4.6 - 6.5 % Final   Glycemic Control Guidelines for People with Diabetes:Non Diabetic:  <6%Goal of Therapy: <7%Additional Action Suggested:  >8%   . Microalb, Ur 11/24/2015 <0.7  0.0 - 1.9 mg/dL Final  . Creatinine,U 11/24/2015 75.7   Final  . Microalb Creat Ratio 11/24/2015 0.9  0.0 - 30.0 mg/g Final    Physical Examination:  BP 110/76 mmHg  Pulse 95   Temp(Src) 98.2 F (36.8 C)  Resp 16  Ht 6' (1.829 m)  Wt 193 lb 9.6 oz (87.816 kg)  BMI 26.25 kg/m2  SpO2 96%     ASSESSMENT:  Diabetes type 2, uncontrolled with normal  BMI See history of present illness for detailed discussion of current diabetes management, blood sugar patterns and problems identified Albert [name] A1c has come down to 7.4 even with not being 3 months since Albert [name] treatment changes have been done Albert [name] is benefiting from [place] Fasting readings are fairly good reportedly with taking Lantus in the evening Not clear if Albert [name] has any postprandial readings after meals   PLAN:     Albert [name] will change Janumet to Janumet XR 100/1000 once a day  Continue Invokana in the morning  No change in Lantus as yet  More readings about 2 hours after meals  Recheck A1c on the next visit  There are no Patient Instructions on file for this visit.     Remmi Armenteros 01/04/2016, 10:32 AM   Note: This office note was prepared with Dragon voice recognition system technology. Any transcriptional errors that result from this process are unintentional.

## 2016-01-05 ENCOUNTER — Telehealth: Payer: Self-pay

## 2016-01-05 NOTE — Telephone Encounter (Signed)
Patient checking on the status of my chart message. Please advise

## 2016-01-05 NOTE — Telephone Encounter (Signed)
Pt called in to check status - per Ashlee Dr. Charlett Blake will sign RXs tomorrow morning for pt to change Lantus to 16 units and for pain meds. Pt planning on p/u of RX 10:00am on 01/06/16.

## 2016-01-05 NOTE — Telephone Encounter (Signed)
Pt returned call but call disconnected

## 2016-01-05 NOTE — Telephone Encounter (Signed)
Called patient and left a message for call back  

## 2016-01-06 ENCOUNTER — Other Ambulatory Visit: Payer: Self-pay | Admitting: Family Medicine

## 2016-01-06 DIAGNOSIS — E669 Obesity, unspecified: Principal | ICD-10-CM

## 2016-01-06 DIAGNOSIS — E1169 Type 2 diabetes mellitus with other specified complication: Secondary | ICD-10-CM

## 2016-01-06 MED ORDER — HYDROCODONE-ACETAMINOPHEN 5-325 MG PO TABS: 1.0000 | ORAL_TABLET | Freq: Three times a day (TID) | ORAL | Status: DC | PRN

## 2016-01-06 MED ORDER — INSULIN GLARGINE 100 UNIT/ML SOLOSTAR PEN: 16.0000 [IU] | PEN_INJECTOR | Freq: Every day | SUBCUTANEOUS | Status: DC

## 2016-01-06 MED FILL — BD PEN NDL MINI 31GX5MM: 31G X 5 MM | 90 days supply | Qty: 100 | Fill #1

## 2016-01-06 MED FILL — LANTUS SOLOSTAR 100 UNITS/M: 100 | 90 days supply | Qty: 15 | Fill #0 | Status: TO

## 2016-01-06 MED FILL — HYDROCODON-APAP 5-325: 5-325 | 10 days supply | Qty: 30 | Fill #0

## 2016-01-06 NOTE — Telephone Encounter (Signed)
PCP agreed to prescribe Hydrocodone 5/325 1 tid prn #30 with 0 refills.  Patient requested by mychart message dated on 01/03/2016 Patient contacted to pickup hardcopy at the front desk.

## 2016-01-07 ENCOUNTER — Telehealth: Payer: Self-pay

## 2016-01-07 NOTE — Telephone Encounter (Signed)
Albert [name] at 01/05/2016 4:20 PM     Status: Signed       Expand All Collapse All   Pt called in to check status - per Rolinda Impson Dr. Charlett Blake will sign RXs tomorrow morning for pt to change Lantus to 16 units and for pain meds. Pt planning on p/u of RX 10:00am on 01/06/16.       Donell Sievert Ewing, CMA at 01/06/2016 10:43 AM     Status: Signed       Expand All Collapse All   PCP agreed to prescribe Hydrocodone 5/325 1 tid prn #30 with 0 refills.  Patient requested by mychart message dated on 01/03/2016 Patient contacted to pickup hardcopy at the front desk.

## 2016-01-07 NOTE — Telephone Encounter (Signed)
Opened in error

## 2016-01-11 MED FILL — CIALIS 20 MG TABLET: 20 | 30 days supply | Qty: 6 | Fill #3

## 2016-01-26 ENCOUNTER — Other Ambulatory Visit: Payer: Self-pay | Admitting: Endocrinology

## 2016-01-26 MED FILL — INVOKANA 100 MG TABLET: 100 | 30 days supply | Qty: 30 | Fill #0

## 2016-02-10 ENCOUNTER — Other Ambulatory Visit: Payer: Self-pay | Admitting: Family Medicine

## 2016-02-10 ENCOUNTER — Encounter: Payer: Self-pay | Admitting: Family Medicine

## 2016-02-10 MED ORDER — TIZANIDINE HCL 2 MG PO TABS: 2.0000 mg | ORAL_TABLET | Freq: Two times a day (BID) | ORAL | Status: DC | PRN

## 2016-02-10 MED FILL — tiZANidine HCL 2 MG TABS: 2 | 15 days supply | Qty: 30 | Fill #0

## 2016-02-11 ENCOUNTER — Telehealth: Payer: Self-pay | Admitting: Family Medicine

## 2016-02-11 NOTE — Telephone Encounter (Signed)
Appointment Request From: [name]       With Provider: [name], [place] [facility]]      Preferred Date Range: From 02/10/2016 To 02/18/2016      Reason: To address the following health maintenance concerns.   Hepatitis C Screening      Comments:   Need to schedule an appointment for my hep c.

## 2016-02-21 MED FILL — CIALIS 20 MG TABLET: 20 | 30 days supply | Qty: 6 | Fill #4

## 2016-02-29 ENCOUNTER — Other Ambulatory Visit: Payer: Self-pay | Admitting: Family Medicine

## 2016-02-29 ENCOUNTER — Ambulatory Visit: Payer: 59 | Admitting: Family Medicine

## 2016-02-29 ENCOUNTER — Ambulatory Visit: Payer: Self-pay | Admitting: Family

## 2016-02-29 MED ORDER — TIZANIDINE HCL 2 MG PO TABS: 2.0000 mg | ORAL_TABLET | Freq: Two times a day (BID) | ORAL | Status: DC | PRN

## 2016-02-29 MED FILL — tiZANidine HCL 2 MG TABS: 2 | 15 days supply | Qty: 30 | Fill #0

## 2016-02-29 NOTE — Telephone Encounter (Signed)
°  Relationship to patient: Self Can be reached: [phone]   Reason for call: Patient needs refill on tiZANidine (ZANAFLEX) 2 MG tablet [id number] Patient of Dr. Charlett Blake, had to cancel appt 7/18.

## 2016-02-29 NOTE — Telephone Encounter (Signed)
Last seen 11/30/15 and filled 02/10/16 #30   Please advise    KP

## 2016-02-29 NOTE — Telephone Encounter (Signed)
Pt called wanting to know about med refill, pt was informed rx was faxed to his pharmacy [facility]). Pt understood.

## 2016-03-01 MED FILL — INVOKANA 100 MG TABLET: 100 | 30 days supply | Qty: 30 | Fill #1

## 2016-03-17 ENCOUNTER — Ambulatory Visit (INDEPENDENT_AMBULATORY_CARE_PROVIDER_SITE_OTHER): Payer: 59 | Admitting: Family Medicine

## 2016-03-17 ENCOUNTER — Ambulatory Visit (HOSPITAL_BASED_OUTPATIENT_CLINIC_OR_DEPARTMENT_OTHER)
Admission: RE | Admit: 2016-03-17 | Discharge: 2016-03-17 | Disposition: A | Discharge status: Home / Self Care | Payer: 59 | Source: Ambulatory Visit | Attending: Family Medicine | Admitting: Family Medicine

## 2016-03-17 ENCOUNTER — Encounter: Payer: Self-pay | Admitting: Family Medicine

## 2016-03-17 ENCOUNTER — Other Ambulatory Visit: Payer: Self-pay | Admitting: Family Medicine

## 2016-03-17 VITALS — BP 118/80 | HR 96 | Temp 98.0°F | Resp 16 | Ht 72.0 in | Wt 193.1 lb

## 2016-03-17 DIAGNOSIS — M47898 Other spondylosis, sacral and sacrococcygeal region: Secondary | ICD-10-CM | POA: Diagnosis not present

## 2016-03-17 DIAGNOSIS — K529 Noninfective gastroenteritis and colitis, unspecified: Secondary | ICD-10-CM

## 2016-03-17 DIAGNOSIS — I10 Essential (primary) hypertension: Secondary | ICD-10-CM | POA: Diagnosis not present

## 2016-03-17 DIAGNOSIS — M5410 Radiculopathy, site unspecified: Secondary | ICD-10-CM | POA: Insufficient documentation

## 2016-03-17 DIAGNOSIS — G609 Hereditary and idiopathic neuropathy, unspecified: Secondary | ICD-10-CM

## 2016-03-17 DIAGNOSIS — M8938 Hypertrophy of bone, other site: Secondary | ICD-10-CM | POA: Insufficient documentation

## 2016-03-17 DIAGNOSIS — E1149 Type 2 diabetes mellitus with other diabetic neurological complication: Secondary | ICD-10-CM

## 2016-03-17 DIAGNOSIS — E785 Hyperlipidemia, unspecified: Secondary | ICD-10-CM | POA: Diagnosis not present

## 2016-03-17 DIAGNOSIS — M5136 Other intervertebral disc degeneration, lumbar region: Secondary | ICD-10-CM | POA: Diagnosis not present

## 2016-03-17 DIAGNOSIS — M4806 Spinal stenosis, lumbar region: Secondary | ICD-10-CM | POA: Insufficient documentation

## 2016-03-17 DIAGNOSIS — M5416 Radiculopathy, lumbar region: Secondary | ICD-10-CM

## 2016-03-17 MED ORDER — TIZANIDINE HCL 2 MG PO TABS: 2.0000 mg | ORAL_TABLET | Freq: Two times a day (BID) | ORAL | 1 refills | Status: DC | PRN

## 2016-03-17 MED FILL — tiZANidine HCL 2 MG TABS: 2 | 20 days supply | Qty: 40 | Fill #0

## 2016-03-17 NOTE — Patient Instructions (Addendum)
NOW probiotic caps, 10 strains in 1 cap daily, can purchase at [facility]  Ginger caps, tea    Back Pain, Adult Back pain is very common. The pain often gets better over time. The cause of back pain is usually not dangerous. Most people can learn to manage their back pain on their own.  HOME CARE  Watch your back pain for any changes. The following actions may help to lessen any pain you are feeling:  Stay active. Start with short walks on flat ground if you can. Try to walk farther each day.  Exercise regularly as told by your doctor. Exercise helps your back heal faster. It also helps avoid future injury by keeping your muscles strong and flexible.  Do not sit, drive, or stand in one place for more than 30 minutes.  Do not stay in bed. Resting more than 1-2 days can slow down your recovery.  Be careful when you bend or lift an object. Use good form when lifting:  Bend at your knees.  Keep the object close to your body.  Do not twist.  Sleep on a firm mattress. Lie on your side, and bend your knees. If you lie on your back, put a pillow under your knees.  Take medicines only as told by your doctor.  Put ice on the injured area.  Put ice in a plastic bag.  Place a towel between your skin and the bag.  Leave the ice on for 20 minutes, 2-3 times a day for the first 2-3 days. After that, you can switch between ice and heat packs.  Avoid feeling anxious or stressed. Find good ways to deal with stress, such as exercise.  Maintain a healthy weight. Extra weight puts stress on your back. GET HELP IF:   You have pain that does not go away with rest or medicine.  You have worsening pain that goes down into your legs or buttocks.  You have pain that does not get better in one week.  You have pain at night.  You lose weight.  You have a fever or chills. GET HELP RIGHT AWAY IF:   You cannot control when you poop (bowel movement) or pee (urinate).  Your arms or legs feel  weak.  Your arms or legs lose feeling (numbness).  You feel sick to your stomach (nauseous) or throw up (vomit).  You have belly (abdominal) pain.  You feel like you may pass out (faint).   This information is not intended to replace advice given to you by your health care provider. Make sure you discuss any questions you have with your health care provider.   Document Released: 01/17/2008 Document Revised: 08/21/2014 Document Reviewed: 12/02/2013 Elsevier Interactive Patient Education [employer].

## 2016-03-17 NOTE — Progress Notes (Signed)
Pre visit review using our clinic review tool, if applicable. No additional management support is needed unless otherwise documented below in the visit note/SLS  

## 2016-03-19 ENCOUNTER — Encounter: Payer: Self-pay | Admitting: Family Medicine

## 2016-03-20 ENCOUNTER — Other Ambulatory Visit: Payer: Self-pay | Admitting: Family Medicine

## 2016-03-20 MED FILL — ATORVASTATIN 40 MG TABLET: 40 | 90 days supply | Qty: 90 | Fill #0

## 2016-03-20 MED FILL — LISINOPRIL 10 MG TABLET: 10 | 90 days supply | Qty: 180 | Fill #0

## 2016-03-21 ENCOUNTER — Ambulatory Visit: Payer: 59 | Attending: Family Medicine | Admitting: Physical Therapy

## 2016-03-21 DIAGNOSIS — R262 Difficulty in walking, not elsewhere classified: Secondary | ICD-10-CM | POA: Insufficient documentation

## 2016-03-21 DIAGNOSIS — M5441 Lumbago with sciatica, right side: Secondary | ICD-10-CM | POA: Diagnosis not present

## 2016-03-21 NOTE — Therapy (Signed)
[facility] [address]  [place], [place], [address] Phone: [phone]   Fax:  [phone]  Physical Therapy Evaluation  Patient Details  Name: Albert [name] MRN: [id number] Date of Birth: 1956/02/23 Referring Provider: [name], MD  Encounter Date: 03/21/2016      PT End of Session - 03/21/16 1535    Visit Number 1   Number of Visits 12   Date for PT Re-Evaluation 05/05/16   PT Start Time 1455   PT Stop Time 1535   PT Time Calculation (min) 40 min   Activity Tolerance Patient tolerated treatment well   Behavior During Therapy [facility] for tasks assessed/performed      Past Medical History:  Diagnosis Date  . Colon polyps    adenomatous  . Diverticulosis 10/26/2013  . DM (diabetes mellitus), type 2 with neurological complications ([place]) 123456  . 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 History of chicken pox    childhood  . History of kidney stones   . Hyperlipidemia 07/26/2014  . Hypertension   . Insomnia 02/25/2013  . Preventative health care 07/26/2014  . Renal lithiasis 02/21/2015  . Sciatica of right side 12/22/2012  . Seasonal allergies   . Squamous cell carcinoma in situ 2014   forehead, s/p excision by derm    Past Surgical History:  Procedure Laterality Date  . ABDOMINAL HERNIA REPAIR    . CHOLECYSTECTOMY    . HERNIA REPAIR    . LAPAROSCOPIC APPENDECTOMY N/A 08/16/2014   Procedure: APPENDECTOMY LAPAROSCOPIC;  Surgeon: Erroll Luna, MD;  Location: [facility];  Service: General;  Laterality: N/A;  . MASS EXCISION  05/22/2012   Procedure: EXCISION MASS;  Surgeon: Joyice Faster. Cornett, MD;  Location: [facility];  Service: General;  Laterality: Left;  excision abdominal wall mass  . ROTATOR CUFF REPAIR      There were no vitals filed for this visit.       Subjective Assessment - 03/21/16 1459    Subjective Pt reports 7 yr h/o low back with sciatica but with  worsening B low back pain and R radiculopathy for past 4-5 months. Previously relieved with stretching but not as effective presently.   Limitations Walking   Diagnostic tests 03/17/16 Lumbar spine x-ray: Mild degenerative disc space narrowing centered at L5 and transitional S1. There is no spondylolisthesis. Mild facet joint hypertrophy at L5-S1 and S1-S2. Mild spondylosis elsewhere. The observed portions of the sacrum exhibit no acute abnormalities.   Patient Stated Goals "no more pain, no more drugs"   Currently in Pain? Yes   Pain Score 3   Least 3/10, Avg 5-7/10, Worst 10/10   Pain Location Back   Pain Orientation Lower;Right   Pain Descriptors / Indicators Radiating;Throbbing   Pain Type Acute pain;Chronic pain   Pain Radiating Towards Pain/numbness/tingling along back of R LE to ankle   Pain Onset More than a month ago   Pain Frequency Constant   Aggravating Factors  Walking, lifting pts   Pain Relieving Factors Stretching, DKTC   Effect of Pain on Daily Activities Limits walking tolerance at work            [facility] PT Assessment - 03/21/16 1455      Assessment   Medical Diagnosis Low back pain with R-sided radiculopathy   Referring Provider [name], MD   Onset Date/Surgical Date --  4-5 months   Next MD Visit TBD  Balance Screen   Has the patient fallen in the past 6 months No   Has the patient had a decrease in activity level because of a fear of falling?  No   Is the patient reluctant to leave their home because of a fear of falling?  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, occasional driving   Leisure Playing with grandkids, fish, swim, hike     Observation/Other Assessments   Focus on Therapeutic Outcomes (FOTO)  Lumbar spine - 68% (32% limitation); predicted 72% (28% limitation)     ROM / Strength   AROM / PROM / Strength AROM;Strength     AROM    Overall AROM  Within functional limits for tasks performed   AROM Assessment Site Lumbar   Lumbar Flexion hands to distal shins   Lumbar Extension WFL   Lumbar - Right Side Bend WFL   Lumbar - Left Side Bend WFL but pain in R low back   Lumbar - Right Rotation WFL   Lumbar - Left Rotation [facility]     Strength   Strength Assessment Site Hip   Right/Left Hip Right;Left   Right Hip Flexion 4/5   Right Hip Extension 4/5   Right Hip ABduction 4+/5   Right Hip ADduction 4/5   Left Hip Flexion 5/5   Left Hip Extension 4+/5   Left Hip ABduction 5/5   Left Hip ADduction 5/5     Flexibility   Soft Tissue Assessment /Muscle Length yes   Hamstrings mildly tight R>L   Quadriceps mild/mod tight B esp RF R>L   ITB mildly tight R>L   Piriformis mildly tight B     Palpation   Palpation comment ttp over R SIJ & piriformis         Today's Treatment  TherEx Supine HS stretch with strap x30" (also instructed in standing and seated versions to be utilized PRN while at work) Supine ITB stretch with strap x30" Supine figure 4 glute/piriformis stretch x30" Supine KTOS piriformis stretch x30' Supine mod thomas hip flexor stretch x30" SKTC stretch x30" DKTC stretch x30"           PT Education - 03/21/16 1530    Education provided Yes   Education Details PT eval findings, POC & initial stretching HEP   Person(s) Educated Patient   Methods Explanation;Demonstration;Handout   Comprehension Verbalized understanding;Returned demonstration;Need further instruction             PT Long Term Goals - 03/21/16 1535      PT LONG TERM GOAL #1   Title Independent with latest HEP by 05/05/16   Status New     PT LONG TERM GOAL #2   Title Lumbar ROM WNL in all planes w/o pain by 05/05/16   Status New     PT LONG TERM GOAL #3   Title B hip strength >/= 4+/5 by 05/05/16   Status New     PT LONG TERM GOAL #4   Title Pt will report ability to complete all daily chores and job tasks w/o  limitation due to LBP by 05/05/16   Status New               Plan - 03/21/16 1535    Clinical Impression Statement Albert [name] is a 60 y/o male with c/o B low back/R buttock pain with radicular pain, numbness  and tingling down posterior R LE to ankle originating ~5 years ago but worsening over past 4-5 months. Pt reports previously able to resolve LBP with stretching but not as effective presently. Pt states current pain is across B low back and extends into R buttock at 3/10 at time of eval, but reports average pain at 5-7/10 and worst up to 10/10. Pt denies any radicular pain, numbness, or tingling beyond the pain in the buttock area. 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 Assessment revealed lumbar ROM essentially WFL/WNL in all directions but pain reported in R low back/hip with L sidebending. Flexibility assessment reveals mild to moderate proximal LE tightness in B HS, ITB, piriformis, hip flexors and quads especially RF. Pt very ttp over R SIJ and piriformis. Proximal LE strength assessment reveals mild weakness in R hip vs L (refer to above MMT). Given muscle imbalance and tightness, radicular symptoms likely due to piriformis syndrome. POC will focus on improving LE soft tissue pliability along with core/proximal stability training, postural training with emphasis on neutral spine alignment including proper lifting mechanics, LE strengthening and manual therapy/modalities PRN for pain. May consider mechanical traction if radicular symptoms persist and/or dry needling for pain/increased muscle tension.   Rehab Potential Good   PT Frequency 2x / week   PT Duration 6 weeks   PT Treatment/Interventions Patient/family education;Therapeutic exercise;Neuromuscular re-education;Manual techniques;Taping;Dry needling;Electrical Stimulation;Moist Heat;Ultrasound;Traction;Iontophoresis 4mg /ml Dexamethasone;Cryotherapy;ADLs/Self Care Home  Management   PT Next Visit Plan Review initial stretching HEP and initiate lumbar stabilization exercises with HEP addition as indicated; Lumbar/core & proximal LE flexibilty and strengthening; Manual therapy for R piriformis with instruction in self-MFR techniques PRN; Modalities PRN for pain; possible Mechanical traction   Consulted and Agree with Plan of Care Patient      Patient will benefit from skilled therapeutic intervention in order to improve the following deficits and impairments:  Pain, Impaired flexibility, Decreased strength, Improper body mechanics, Decreased activity tolerance, Difficulty walking  Visit Diagnosis: Bilateral low back pain with right-sided sciatica  Difficulty in walking, not elsewhere classified     Problem List Patient Active Problem List   Diagnosis Date Noted  . 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 Renal lithiasis 02/21/2015  . Chest pain 12/18/2014  . Atypical chest pain 11/01/2014  . Rib pain on right side 08/17/2014  . Bursitis of left shoulder 08/12/2014  . Hyperlipidemia 07/26/2014  . 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 Preventative health care 07/26/2014  . BPH (benign prostatic hyperplasia) 12/11/2013  . Diverticulosis 10/26/2013  . Acute neck pain 06/04/2013  . Insomnia 02/25/2013  . Sciatica of right side 12/22/2012  . HTN (hypertension) 11/22/2012  . Headache 11/15/2012  . Right bundle branch block 11/15/2012  . Skin lesion of scalp 08/15/2012  . DM (diabetes mellitus), type 2 with neurological complications ([place]) 123456  . Lumbar radiculopathy 02/09/2012    Albert [name], PT, MPT 03/21/2016, 7:27 PM  [facility] [address]  [place], [place], [address] Phone: [phone]   Fax:  [phone]  Name: Albert [name] MRN: [id number] Date of Birth: 19-Apr-1956

## 2016-03-22 ENCOUNTER — Ambulatory Visit: Payer: 59

## 2016-03-22 DIAGNOSIS — M5441 Lumbago with sciatica, right side: Secondary | ICD-10-CM | POA: Diagnosis not present

## 2016-03-22 DIAGNOSIS — R262 Difficulty in walking, not elsewhere classified: Secondary | ICD-10-CM | POA: Diagnosis not present

## 2016-03-22 NOTE — Therapy (Signed)
[facility] [address]  [place], [place], [address] Phone: [phone]   Fax:  [phone]  Physical Therapy Treatment  Patient Details  Name: Albert [name] MRN: [id number] Date of Birth: 02-Feb-1956 Referring Provider: [name], MD  Encounter Date: 03/22/2016      PT End of Session - 03/22/16 [id number]    Visit Number 2   Number of Visits 12   Date for PT Re-Evaluation 05/05/16   PT Start Time 0805   PT Stop Time 0845   PT Time Calculation (min) 40 min   Activity Tolerance Patient tolerated treatment well   Behavior During Therapy [facility] for tasks assessed/performed      Past Medical History:  Diagnosis Date  . Colon polyps    adenomatous  . Diverticulosis 10/26/2013  . DM (diabetes mellitus), type 2 with neurological complications ([place]) 123456  . 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 History of chicken pox    childhood  . History of kidney stones   . Hyperlipidemia 07/26/2014  . Hypertension   . Insomnia 02/25/2013  . Preventative health care 07/26/2014  . Renal lithiasis 02/21/2015  . Sciatica of right side 12/22/2012  . Seasonal allergies   . Squamous cell carcinoma in situ 2014   forehead, s/p excision by derm    Past Surgical History:  Procedure Laterality Date  . ABDOMINAL HERNIA REPAIR    . CHOLECYSTECTOMY    . HERNIA REPAIR    . LAPAROSCOPIC APPENDECTOMY N/A 08/16/2014   Procedure: APPENDECTOMY LAPAROSCOPIC;  Surgeon: Erroll Luna, MD;  Location: [facility];  Service: General;  Laterality: N/A;  . MASS EXCISION  05/22/2012   Procedure: EXCISION MASS;  Surgeon: Joyice Faster. Cornett, MD;  Location: [facility];  Service: General;  Laterality: Left;  excision abdominal wall mass  . ROTATOR CUFF REPAIR      There were no vitals filed for this visit.      Subjective Assessment - 03/22/16 0809    Subjective Pt. reports his R sided Lower back pain is a 2/10 pain  initially today; pt performed HEP last night.     Patient Stated Goals "no more pain, no more drugs"   Currently in Pain? Yes   Pain Score 2    Pain Location Back   Pain Orientation Lower;Right   Pain Type Acute pain;Chronic pain   Pain Onset More than a month ago   Multiple Pain Sites No          Today's Treatment:  Therex: NuStep: level 4, 5 min   HEP review: B HS stretch with strap x 30 sec each B SKTC stretch x 30 sec each side  B DKTC stretch x 30 sec each side  B Hip flexor stretch x 30 sec each side  B Piriformis stretch modified x 30 sec each side B ITB stretch x 30 sec each  Therex/HEP creation: Pelvic tilt 5" x 10 reps Hooklying LE march with abdom. Bracing with blue TB x 10 reps Hooklying alternating hip abd/ER with blue TB x 10 reps  Cat/camel 5" x 5 reps        PT Education - 03/22/16 0857    Education provided Yes   Education Details Pelvic tilt, brace march with blue TB, hooklying alternating hip ER/abd with blue TB, cat/camal   Person(s) Educated Patient   Methods Handout;Explanation;Demonstration;Verbal cues   Comprehension Verbalized understanding;Need further instruction;Verbal cues required  PT Long Term Goals - 03/22/16 [id number]      PT LONG TERM GOAL #1   Title Independent with latest HEP by 05/05/16   Status On-going     PT LONG TERM GOAL #2   Title Lumbar ROM WNL in all planes w/o pain by 05/05/16   Status On-going     PT LONG TERM GOAL #3   Title B hip strength >/= 4+/5 by 05/05/16   Status On-going     PT LONG TERM GOAL #4   Title Pt will report ability to complete all daily chores and job tasks w/o limitation due to LBP by 05/05/16   Status On-going               Plan - 03/22/16 [id number]    Clinical Impression Statement Pt. reports his R sided Lower back pain is a 2/10 pain initially today; pt performed HEP last night.  Additional lumbar stabilization strengthening activities provided to pt. via handout with  blue looped TB; pt. able to demo good technique with this.  Pt. LBP decreased following therex today.  Will plan to instruct in self-MFR next treatment.     PT Treatment/Interventions Patient/family education;Therapeutic exercise;Neuromuscular re-education;Manual techniques;Taping;Dry needling;Electrical Stimulation;Moist Heat;Ultrasound;Traction;Iontophoresis 4mg /ml Dexamethasone;Cryotherapy;ADLs/Self Care Home Management   PT Next Visit Plan Review lumbopelvic strengthening HEP; Lumbar/core & proximal LE flexibilty and strengthening; Manual therapy for R piriformis with instruction in self-MFR techniques PRN; Modalities PRN for pain; possible Mechanical traction      Patient will benefit from skilled therapeutic intervention in order to improve the following deficits and impairments:  Pain, Impaired flexibility, Decreased strength, Improper body mechanics, Decreased activity tolerance, Difficulty walking  Visit Diagnosis: Bilateral low back pain with right-sided sciatica  Difficulty in walking, not elsewhere classified     Problem List Patient Active Problem List   Diagnosis Date Noted  . 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 Renal lithiasis 02/21/2015  . Chest pain 12/18/2014  . Atypical chest pain 11/01/2014  . Rib pain on right side 08/17/2014  . Bursitis of left shoulder 08/12/2014  . Hyperlipidemia 07/26/2014  . 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 Preventative health care 07/26/2014  . BPH (benign prostatic hyperplasia) 12/11/2013  . Diverticulosis 10/26/2013  . Acute neck pain 06/04/2013  . Insomnia 02/25/2013  . Sciatica of right side 12/22/2012  . HTN (hypertension) 11/22/2012  . Headache 11/15/2012  . Right bundle branch block 11/15/2012  . Skin lesion of scalp 08/15/2012  . DM (diabetes mellitus), type 2 with neurological complications ([place]) 123456  . Lumbar radiculopathy 02/09/2012    Bess Harvest, PTA 03/22/2016,  9:02 AM  [facility] [address]  [place], [place], [address] Phone: [phone]   Fax:  [phone]  Name: CALEB KRENKE MRN: [id number] Date of Birth: 11-Dec-1955

## 2016-03-29 ENCOUNTER — Ambulatory Visit: Payer: 59

## 2016-03-29 DIAGNOSIS — M5441 Lumbago with sciatica, right side: Secondary | ICD-10-CM | POA: Diagnosis not present

## 2016-03-29 DIAGNOSIS — R262 Difficulty in walking, not elsewhere classified: Secondary | ICD-10-CM | POA: Diagnosis not present

## 2016-03-29 MED FILL — INVOKANA 100 MG TABLET: 100 | 30 days supply | Qty: 30 | Fill #2

## 2016-03-29 MED FILL — CIALIS 20 MG TABLET: 20 | 30 days supply | Qty: 6 | Fill #5

## 2016-03-29 NOTE — Therapy (Signed)
[facility] [address]  [place], [place], [address] Phone: [phone]   Fax:  [phone]  Physical Therapy Treatment  Patient Details  Name: Albert [name] MRN: [id number] Date of Birth: [birth year] Referring Provider: [name], MD  Encounter Date: 03/29/2016      PT End of Session - 03/29/16 1420    Visit Number 3   Number of Visits 12   Date for PT Re-Evaluation 05/05/16   PT Start Time 1404   PT Stop Time 1445   PT Time Calculation (min) 41 min   Activity Tolerance Patient tolerated treatment well   Behavior During Therapy [facility] for tasks assessed/performed      Past Medical History:  Diagnosis Date  . Colon polyps    adenomatous  . Diverticulosis 10/26/2013  . DM (diabetes mellitus), type 2 with neurological complications ([place]) 123456  . 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 History of chicken pox    childhood  . History of kidney stones   . Hyperlipidemia 07/26/2014  . Hypertension   . Insomnia 02/25/2013  . Preventative health care 07/26/2014  . Renal lithiasis 02/21/2015  . Sciatica of right side 12/22/2012  . Seasonal allergies   . Squamous cell carcinoma in situ 2014   forehead, s/p excision by derm    Past Surgical History:  Procedure Laterality Date  . ABDOMINAL HERNIA REPAIR    . CHOLECYSTECTOMY    . HERNIA REPAIR    . LAPAROSCOPIC APPENDECTOMY N/A 08/16/2014   Procedure: APPENDECTOMY LAPAROSCOPIC;  Surgeon: Erroll Luna, MD;  Location: [facility];  Service: General;  Laterality: N/A;  . MASS EXCISION  05/22/2012   Procedure: EXCISION MASS;  Surgeon: Joyice Faster. Cornett, MD;  Location: [facility];  Service: General;  Laterality: Left;  excision abdominal wall mass  . ROTATOR CUFF REPAIR      There were no vitals filed for this visit.      Subjective Assessment - 03/29/16 1409    Subjective Pt. reports he has been compliant with HEP and having  less pain recently and no radiating pain down LE's.     Patient Stated Goals "no more pain, no more drugs"   Currently in Pain? Yes   Pain Score 2    Pain Location Back   Pain Orientation Lower;Right   Pain Descriptors / Indicators Radiating;Throbbing   Pain Type Acute pain;Chronic pain   Pain Onset More than a month ago   Pain Frequency Constant   Multiple Pain Sites No      Today's Treatment:  Therex: NuStep: level 4, 5 min  B HS, glute, piri, SKTC x 30 sec each  B Hip flexor stretch (mod thomas pos.) x 1 min each side  Hooklying bridge x 15 reps  Singe leg bridge x 10 reps each  Cat/camel 5" x 5 reps Quadruped alternating LE's x 10 reps each  Quadruped alternating UE's x 10 reps each  Quadruped alternating UE/LE x 5 each each  POE x 1 min             PT Long Term Goals - 03/22/16 [id number]      PT LONG TERM GOAL #1   Title Independent with latest HEP by 05/05/16   Status On-going     PT LONG TERM GOAL #2   Title Lumbar ROM WNL in all planes w/o pain by 05/05/16   Status On-going     PT LONG TERM GOAL #  3   Title B hip strength >/= 4+/5 by 05/05/16   Status On-going     PT LONG TERM GOAL #4   Title Pt will report ability to complete all daily chores and job tasks w/o limitation due to LBP by 05/05/16   Status On-going               Plan - 03/29/16 1426    Clinical Impression Statement Pt. reports he has been compliant with HEP and having less pain recently and no radiating pain down LE's.  Pt. tolerated all lumbopelvic stretching and strengthening activity well today with addition of quadruped alternating UE/LE raise.  Pt. pain free following therex today, and continues to progress well.     PT Treatment/Interventions Patient/family education;Therapeutic exercise;Neuromuscular re-education;Manual techniques;Taping;Dry needling;Electrical Stimulation;Moist Heat;Ultrasound;Traction;Iontophoresis 4mg /ml Dexamethasone;Cryotherapy;ADLs/Self Care Home Management    PT Next Visit Plan Lumbar/core & proximal LE flexibilty and strengthening; Manual therapy for R piriformis with instruction in self-MFR techniques PRN; Modalities PRN for pain; possible Mechanical traction      Patient will benefit from skilled therapeutic intervention in order to improve the following deficits and impairments:  Pain, Impaired flexibility, Decreased strength, Improper body mechanics, Decreased activity tolerance, Difficulty walking  Visit Diagnosis: Bilateral low back pain with right-sided sciatica  Difficulty in walking, not elsewhere classified     Problem List Patient Active Problem List   Diagnosis Date Noted  . 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 Renal lithiasis 02/21/2015  . Chest pain 12/18/2014  . Atypical chest pain 11/01/2014  . Rib pain on right side 08/17/2014  . Bursitis of left shoulder 08/12/2014  . Hyperlipidemia 07/26/2014  . 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 Preventative health care 07/26/2014  . BPH (benign prostatic hyperplasia) 12/11/2013  . Diverticulosis 10/26/2013  . Acute neck pain 06/04/2013  . Insomnia 02/25/2013  . Sciatica of right side 12/22/2012  . HTN (hypertension) 11/22/2012  . Headache 11/15/2012  . Right bundle branch block 11/15/2012  . Skin lesion of scalp 08/15/2012  . DM (diabetes mellitus), type 2 with neurological complications ([place]) 123456  . Lumbar radiculopathy 02/09/2012    Bess Harvest, PTA 03/29/2016, 6:05 PM  [facility] [address]  [place], [place], [address] Phone: [phone]   Fax:  [phone]  Name: Albert [name] MRN: [id number] Date of Birth: 1956-05-11

## 2016-03-31 ENCOUNTER — Other Ambulatory Visit (INDEPENDENT_AMBULATORY_CARE_PROVIDER_SITE_OTHER): Payer: 59

## 2016-03-31 ENCOUNTER — Ambulatory Visit: Payer: 59 | Admitting: Physical Therapy

## 2016-03-31 DIAGNOSIS — R262 Difficulty in walking, not elsewhere classified: Secondary | ICD-10-CM | POA: Diagnosis not present

## 2016-03-31 DIAGNOSIS — M5441 Lumbago with sciatica, right side: Secondary | ICD-10-CM

## 2016-03-31 DIAGNOSIS — E1165 Type 2 diabetes mellitus with hyperglycemia: Secondary | ICD-10-CM

## 2016-03-31 DIAGNOSIS — Z794 Long term (current) use of insulin: Secondary | ICD-10-CM | POA: Diagnosis not present

## 2016-03-31 LAB — MICROALBUMIN / CREATININE URINE RATIO
Creatinine,U: 75.2 mg/dL
Microalb Creat Ratio: 0.9 mg/g (ref 0.0–30.0)
Microalb, Ur: <0.7 mg/dL (ref 0.0–1.9)

## 2016-03-31 LAB — BASIC METABOLIC PANEL
BUN: 11 mg/dL (ref 6–23)
CHLORIDE: 103 meq/L (ref 96–112)
CO2: 20 meq/L (ref 19–32)
CREATININE: 0.85 mg/dL (ref 0.40–1.50)
Calcium: 9.2 mg/dL (ref 8.4–10.5)
GFR: 98.44 mL/min (ref >60.00)
Glucose, Bld: 168 mg/dL — ABNORMAL HIGH (ref 70–99)
POTASSIUM: 4.1 meq/L (ref 3.5–5.1)
Sodium: 133 mEq/L — ABNORMAL LOW (ref 135–145)

## 2016-03-31 LAB — HEMOGLOBIN A1C: Hgb A1c MFr Bld: 8.2 % — ABNORMAL HIGH (ref 4.6–6.5)

## 2016-03-31 NOTE — Therapy (Signed)
[facility] [address]  [place], [place], [address] Phone: [phone]   Fax:  [phone]  Physical Therapy Treatment  Patient Details  Name: Albert [name] MRN: [id number] Date of Birth: 18-Jan-1956 Referring Provider: [name], MD  Encounter Date: 03/31/2016      PT End of Session - 03/31/16 1018    Visit Number 4   Number of Visits 12   Date for PT Re-Evaluation 05/05/16   PT Start Time 1018   PT Stop Time 1100   PT Time Calculation (min) 42 min   Activity Tolerance Patient tolerated treatment well   Behavior During Therapy [facility] for tasks assessed/performed      Past Medical History:  Diagnosis Date  . Colon polyps    adenomatous  . Diverticulosis 10/26/2013  . DM (diabetes mellitus), type 2 with neurological complications ([place]) 123456  . 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 History of chicken pox    childhood  . History of kidney stones   . Hyperlipidemia 07/26/2014  . Hypertension   . Insomnia 02/25/2013  . Preventative health care 07/26/2014  . Renal lithiasis 02/21/2015  . Sciatica of right side 12/22/2012  . Seasonal allergies   . Squamous cell carcinoma in situ 2014   forehead, s/p excision by derm    Past Surgical History:  Procedure Laterality Date  . ABDOMINAL HERNIA REPAIR    . CHOLECYSTECTOMY    . HERNIA REPAIR    . LAPAROSCOPIC APPENDECTOMY N/A 08/16/2014   Procedure: APPENDECTOMY LAPAROSCOPIC;  Surgeon: Erroll Luna, MD;  Location: [place];  Service: General;  Laterality: N/A;  . MASS EXCISION  05/22/2012   Procedure: EXCISION MASS;  Surgeon: Joyice Faster. Cornett, MD;  Location: [place];  Service: General;  Laterality: Left;  excision abdominal wall mass  . ROTATOR CUFF REPAIR      There were no vitals filed for this visit.      Subjective Assessment - 03/31/16 1018    Subjective Pt states he feels like therapy and the HEP are really  helping.   Patient Stated Goals "no more pain, no more drugs"   Currently in Pain? Yes   Pain Score --  2-3/10   Pain Location Back   Pain Orientation Lower;Right   Pain Onset More than a month ago            Today's Treatment  TherEx Treadmill -  2.5 mph x 5'  Manual Manual B HS, glute, piri, SKTC x30" each STM/DTM & IASTM with 1000 Gr red med ball & beaded roller to R piriformis & ITB in L sidelying  TherEx  Instructed in foam rolling for piriformis, HS & ITB TrA + Bridge + Alt Hip ABD/ER with black TB x20 B Sidelying Hip Clam with black TB x15            PT Education - 03/31/16 1100    Education Details Self-MFR for piriformis with ball, Foam rolling technique for piriformis, HS & ITB   Person(s) Educated Patient   Methods Explanation;Demonstration   Comprehension Verbalized understanding;Returned demonstration             PT Long Term Goals - 03/31/16 1100      PT LONG TERM GOAL #1   Title Independent with latest HEP by 05/05/16   Status On-going     PT LONG TERM GOAL #2   Title Lumbar ROM WNL in all planes w/o pain  by 05/05/16   Status On-going     PT LONG TERM GOAL #3   Title B hip strength >/= 4+/5 by 05/05/16   Status On-going     PT LONG TERM GOAL #4   Title Pt will report ability to complete all daily chores and job tasks w/o limitation due to LBP by 05/05/16   Status On-going               Plan - 03/31/16 1100    Clinical Impression Statement Pt reports he frequently uses stretches throughout the workday for relief of pain/tightness with benefit noted. Provided instruction in self-MFR using ball and foam roller for piriformis, HS & ITB with pt liking foam roller.   PT Treatment/Interventions Patient/family education;Therapeutic exercise;Neuromuscular re-education;Manual techniques;Taping;Dry needling;Electrical Stimulation;Moist Heat;Ultrasound;Traction;Iontophoresis 4mg /ml Dexamethasone;Cryotherapy;ADLs/Self Care Home Management    PT Next Visit Plan Lumbar/core & proximal LE flexibilty and strengthening; Manual therapy for R piriformis with review of self-MFR techniques PRN; Modalities PRN for pain; possible Mechanical traction   Consulted and Agree with Plan of Care Patient      Patient will benefit from skilled therapeutic intervention in order to improve the following deficits and impairments:  Pain, Impaired flexibility, Decreased strength, Improper body mechanics, Decreased activity tolerance, Difficulty walking  Visit Diagnosis: Bilateral low back pain with right-sided sciatica  Difficulty in walking, not elsewhere classified     Problem List Patient Active Problem List   Diagnosis Date Noted  . 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 Renal lithiasis 02/21/2015  . Chest pain 12/18/2014  . Atypical chest pain 11/01/2014  . Rib pain on right side 08/17/2014  . Bursitis of left shoulder 08/12/2014  . Hyperlipidemia 07/26/2014  . 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 Preventative health care 07/26/2014  . BPH (benign prostatic hyperplasia) 12/11/2013  . Diverticulosis 10/26/2013  . Acute neck pain 06/04/2013  . Insomnia 02/25/2013  . Sciatica of right side 12/22/2012  . HTN (hypertension) 11/22/2012  . Headache 11/15/2012  . Right bundle branch block 11/15/2012  . Skin lesion of scalp 08/15/2012  . DM (diabetes mellitus), type 2 with neurological complications ([place]) 123456  . Lumbar radiculopathy 02/09/2012    Percival Spanish, PT, MPT 03/31/2016, 12:51 PM  [facility] [address]  [place], [place], [address] Phone: [phone]   Fax:  [phone]  Name: Albert [name] MRN: [id number] Date of Birth: August 17, 1955

## 2016-04-02 DIAGNOSIS — K529 Noninfective gastroenteritis and colitis, unspecified: Secondary | ICD-10-CM | POA: Insufficient documentation

## 2016-04-02 NOTE — Assessment & Plan Note (Signed)
N/V has resolved but myalgias and diarrhea are still present. Encouraged to maintain clear fluids and  Simple carbs for 24 hours then progess diet as tolerated. Ginger prn. Report if no improvement

## 2016-04-02 NOTE — Assessment & Plan Note (Signed)
hgba1c increased, minimize simple carbs. Increase

## 2016-04-02 NOTE — Progress Notes (Signed)
[name], male   DOB: 01/12/1956, 60 y.o.   MRN: [id number]   Subjective:    [name], male    DOB: Mar 20, 1956, 60 y.o.   MRN: [id number]  Chief Complaint  Patient presents with  . Follow-up    Pt c/o Sciatica causing pain radiating down to Right foot  . Diarrhea    Pt c/o Nausea & vomiting that began in Tuesday, still having diarrhea, body aches & fatigue    HPI Patient is in today for follow up. He has been struggling with increase in low back pain recentlu with radicular pain to right foot at times. He has trouble getting into a comfortable position. Also had some nausea and vomiting earlier in the week this has resolved but still having some malaise, myalgias and a couple loose stool daily. No bloody or tarry stool. Denies CP/palp/SOB/HA/congestion/fevers/GI or GU c/o. Taking meds as prescribed  Past Medical History:  Diagnosis Date  . Colon polyps    adenomatous  . Diverticulosis 10/26/2013  . DM (diabetes mellitus), type 2 with neurological complications ([place]) 12/06/9561  . 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 History of chicken pox    childhood  . History of kidney stones   . Hyperlipidemia 07/26/2014  . Hypertension   . Insomnia 02/25/2013  . Preventative health care 07/26/2014  . Renal lithiasis 02/21/2015  . Sciatica of right side 12/22/2012  . Seasonal allergies   . Squamous cell carcinoma in situ 2014   forehead, s/p excision by derm    Past Surgical History:  Procedure Laterality Date  . ABDOMINAL HERNIA REPAIR    . CHOLECYSTECTOMY    . HERNIA REPAIR    . LAPAROSCOPIC APPENDECTOMY N/A 08/16/2014   Procedure: APPENDECTOMY LAPAROSCOPIC;  Surgeon: Erroll Luna, MD;  Location: [facility] [facility];  Service: General;  Laterality: N/A;  . MASS EXCISION  05/22/2012   Procedure: EXCISION MASS;  Surgeon: Joyice Faster. Cornett, MD;  Location: [place];  Service: General;  Laterality: Left;  excision abdominal wall mass    . ROTATOR CUFF REPAIR      Family History  Problem Relation Age of Onset  . Lung cancer Mother   . Breast cancer Neg Hx   . Colon cancer Neg Hx   . Prostate cancer Neg Hx   . Heart disease Neg Hx   . Diabetes Maternal Aunt   . Cancer Paternal Grandmother     Social History   Social History  . Marital status: Married    Spouse name: N/A  . Number of children: 3  . Years of education: N/A   Occupational History  .      Security officer [employer]   Social History Main Topics  . Smoking status: Never Smoker  . Smokeless tobacco: Never Used  . Alcohol use No  . Drug use: No  . Sexual activity: Not on file   Other Topics Concern  . Not on file   Social History Narrative  . No narrative on file    Outpatient Medications Prior to Visit  Medication Sig Dispense Refill  . aspirin EC 81 MG tablet Take 1 tablet (81 mg total) by mouth daily.    . B-D UF III MINI PEN NEEDLES 31G X 5 MM MISC USE TO INJECT LANTUS DAILY 100 each 2  . Blood Glucose Monitoring Suppl (ONE TOUCH ULTRA SYSTEM KIT) W/DEVICE KIT Use as directed to check blood sugar.  Diagnosis code E11.40    .  fluticasone (FLONASE) 50 MCG/ACT nasal spray Place 2 sprays into both nostrils daily. 16 g 1  . glucose blood (BAYER CONTOUR TEST) test strip Test blood sugars as directed. Dx E11.49 100 each 12  . ibuprofen (ADVIL,MOTRIN) 800 MG tablet Take 1 tablet (800 mg total) by mouth 3 (three) times daily. 21 tablet 0  . Insulin Glargine (LANTUS) 100 UNIT/ML Solostar Pen Inject 16 Units into the skin daily. 15 mL 6  . INVOKANA 100 MG TABS tablet TAKE 1 TABLET BY MOUTH DAILY BEFORE BREAKFAST 30 tablet 3  . loperamide (IMODIUM A-D) 2 MG tablet Take 2 mg by mouth 4 (four) times daily as needed for diarrhea or loose stools.    . Multiple Vitamins-Minerals (MULTIVITAMIN WITH MINERALS) tablet Take 1 tablet by mouth daily.    . mupirocin ointment (BACTROBAN) 2 % Place 1 application into the nose 2 (two) times daily. 22 g 0  .  SitaGLIPtin-MetFORMIN HCl (JANUMET XR) [phone] MG TB24 1 tab pc supper 90 tablet 1  . tadalafil (CIALIS) 20 MG tablet Take 1 tablet (20 mg total) by mouth daily as needed for erectile dysfunction. 10 tablet 3  . atorvastatin (LIPITOR) 40 MG tablet TAKE 1 TABLET BY MOUTH DAILY 90 tablet 1  . lisinopril (PRINIVIL,ZESTRIL) 10 MG tablet TAKE 1 TABLET BY MOUTH 2 TIMES DAILY. (Patient taking differently: TAKE 1 TABLET BY MOUTH 1 TIME DAILY.) 180 tablet 3  . tiZANidine (ZANAFLEX) 2 MG tablet Take 1 tablet (2 mg total) by mouth 2 (two) times daily as needed for muscle spasms. 30 tablet 0   No facility-administered medications prior to visit.     Allergies  Allergen Reactions  . Contrast Media [Iodinated Diagnostic Agents] Swelling    Can take if premedicated with diphenhydramine  . Iodine Swelling    Review of Systems  Constitutional: Positive for malaise/fatigue. Negative for fever.  HENT: Negative for congestion.   Eyes: Negative for blurred vision.  Respiratory: Negative for shortness of breath.   Cardiovascular: Negative for chest pain, palpitations and leg swelling.  Gastrointestinal: Positive for diarrhea. Negative for abdominal pain, blood in stool and nausea.  Genitourinary: Negative for dysuria and frequency.  Musculoskeletal: Positive for back pain and myalgias. Negative for falls.  Skin: Negative for rash.  Neurological: Negative for dizziness, loss of consciousness and headaches.  Endo/Heme/Allergies: Negative for environmental allergies.  Psychiatric/Behavioral: Negative for depression. The patient is not nervous/anxious.        Objective:    Physical Exam  Constitutional: He is oriented to person, place, and time. He appears well-developed and well-nourished. No distress.  HENT:  Head: Normocephalic and atraumatic.  Nose: Nose normal.  Eyes: Right eye exhibits no discharge. Left eye exhibits no discharge.  Neck: Normal range of motion. Neck supple.  Cardiovascular:  Normal rate and regular rhythm.   No murmur heard. Pulmonary/Chest: Effort normal and breath sounds normal.  Abdominal: Soft. Bowel sounds are normal. There is no tenderness.  Musculoskeletal: He exhibits no edema.  Neurological: He is alert and oriented to person, place, and time.  Skin: Skin is warm and dry. No erythema.  Psychiatric: He has a normal mood and affect.  Nursing note and vitals reviewed.   BP 118/80 (BP Location: Right Arm, Patient Position: Sitting, Cuff Size: Large)   Pulse 96   Temp 98 F (36.7 C) (Oral)   Resp 16   Ht 6' (1.829 m)   Wt 193 lb 2 oz (87.6 kg)   SpO2 98%   BMI 26.19  kg/m  Wt Readings from Last 3 Encounters:  03/17/16 193 lb 2 oz (87.6 kg)  01/04/16 193 lb 9.6 oz (87.8 kg)  11/30/15 188 lb 2 oz (85.3 kg)     Lab Results  Component Value Date   WBC 7.2 11/24/2015   HGB 14.4 11/24/2015   HCT 42.9 11/24/2015   PLT 233.0 11/24/2015   GLUCOSE 168 (H) 03/31/2016   CHOL 100 11/24/2015   TRIG 120.0 11/24/2015   HDL 33.40 (L) 11/24/2015   LDLDIRECT 109.0 10/26/2014   LDLCALC 42 11/24/2015   ALT 17 11/24/2015   AST 17 11/24/2015   NA 133 (L) 03/31/2016   K 4.1 03/31/2016   CL 103 03/31/2016   CREATININE 0.85 03/31/2016   BUN 11 03/31/2016   CO2 20 03/31/2016   TSH 2.04 11/24/2015   PSA 0.64 09/12/2013   INR 1.10 08/16/2014   HGBA1C 8.2 (H) 03/31/2016   MICROALBUR <0.7 03/31/2016    Lab Results  Component Value Date   TSH 2.04 11/24/2015   Lab Results  Component Value Date   WBC 7.2 11/24/2015   HGB 14.4 11/24/2015   HCT 42.9 11/24/2015   MCV 89.3 11/24/2015   PLT 233.0 11/24/2015   Lab Results  Component Value Date   NA 133 (L) 03/31/2016   K 4.1 03/31/2016   CO2 20 03/31/2016   GLUCOSE 168 (H) 03/31/2016   BUN 11 03/31/2016   CREATININE 0.85 03/31/2016   BILITOT 0.6 11/24/2015   ALKPHOS 53 11/24/2015   AST 17 11/24/2015   ALT 17 11/24/2015   PROT 7.7 11/24/2015   ALBUMIN 4.4 11/24/2015   CALCIUM 9.2 03/31/2016    ANIONGAP 11 01/31/2015   GFR 98.44 03/31/2016   Lab Results  Component Value Date   CHOL 100 11/24/2015   Lab Results  Component Value Date   HDL 33.40 (L) 11/24/2015   Lab Results  Component Value Date   LDLCALC 42 11/24/2015   Lab Results  Component Value Date   TRIG 120.0 11/24/2015   Lab Results  Component Value Date   CHOLHDL 3 11/24/2015   Lab Results  Component Value Date   HGBA1C 8.2 (H) 03/31/2016       Assessment & Plan:   Problem List Items Addressed This Visit    DM (diabetes mellitus), type 2 with neurological complications ([place])    [id number] increased, minimize simple carbs. Increase       Lumbar radiculopathy    Flared recently xray confirms degenerative changes. Encouraged to stay active as tolerated. Use topical treatments, prescribed meds prn and referred to PT, if no improvement will need to proceed with MRI      Relevant Medications   tiZANidine (ZANAFLEX) 2 MG tablet   HTN (hypertension)   Hyperlipidemia   Hereditary and idiopathic peripheral neuropathy   Relevant Medications   tiZANidine (ZANAFLEX) 2 MG tablet   Gastroenteritis    N/V has resolved but myalgias and diarrhea are still present. Encouraged to maintain clear fluids and  Simple carbs for 24 hours then progess diet as tolerated. Ginger prn. Report if no improvement       Other Visit Diagnoses    Back pain with right-sided radiculopathy    -  Primary   Relevant Medications   tiZANidine (ZANAFLEX) 2 MG tablet   Other Relevant Orders   Ambulatory referral to Physical Therapy   DG Lumbar Spine Complete (Completed)      I am having Mr. Rockers maintain his loperamide, aspirin EC,  ONE TOUCH ULTRA SYSTEM KIT, multivitamin with minerals, ibuprofen, fluticasone, mupirocin ointment, glucose blood, tadalafil, B-D UF III MINI PEN NEEDLES, SitaGLIPtin-MetFORMIN HCl, Insulin Glargine, INVOKANA, and tiZANidine.  Meds ordered this encounter  Medications  . tiZANidine (ZANAFLEX) 2 MG tablet     Sig: Take 1 tablet (2 mg total) by mouth 2 (two) times daily as needed for muscle spasms.    Dispense:  40 tablet    Refill:  1     [name], MD

## 2016-04-02 NOTE — Assessment & Plan Note (Signed)
Flared recently xray confirms degenerative changes. Encouraged to stay active as tolerated. Use topical treatments, prescribed meds prn and referred to PT, if no improvement will need to proceed with MRI

## 2016-04-03 ENCOUNTER — Ambulatory Visit: Payer: 59

## 2016-04-03 DIAGNOSIS — M5441 Lumbago with sciatica, right side: Secondary | ICD-10-CM

## 2016-04-03 DIAGNOSIS — R262 Difficulty in walking, not elsewhere classified: Secondary | ICD-10-CM | POA: Diagnosis not present

## 2016-04-03 NOTE — Therapy (Signed)
[facility] [address]  [place], [place], [address] Phone: [phone]   Fax:  [phone]  Physical Therapy Treatment  Patient Details  Name: Albert [name] MRN: [id number] Date of Birth: Dec 15, 1955 Referring Provider: [name], MD  Encounter Date: 04/03/2016      PT End of Session - 04/03/16 0853    Visit Number 5   Number of Visits 12   Date for PT Re-Evaluation 05/05/16   PT Start Time 0845   PT Stop Time 0925   PT Time Calculation (min) 40 min   Activity Tolerance Patient tolerated treatment well   Behavior During Therapy [facility] for tasks assessed/performed      Past Medical History:  Diagnosis Date  . Colon polyps    adenomatous  . Diverticulosis 10/26/2013  . DM (diabetes mellitus), type 2 with neurological complications ([place]) 123456  . 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 History of chicken pox    childhood  . History of kidney stones   . Hyperlipidemia 07/26/2014  . Hypertension   . Insomnia 02/25/2013  . Preventative health care 07/26/2014  . Renal lithiasis 02/21/2015  . Sciatica of right side 12/22/2012  . Seasonal allergies   . Squamous cell carcinoma in situ 2014   forehead, s/p excision by derm    Past Surgical History:  Procedure Laterality Date  . ABDOMINAL HERNIA REPAIR    . CHOLECYSTECTOMY    . HERNIA REPAIR    . LAPAROSCOPIC APPENDECTOMY N/A 08/16/2014   Procedure: APPENDECTOMY LAPAROSCOPIC;  Surgeon: Erroll Luna, MD;  Location: [facility];  Service: General;  Laterality: N/A;  . MASS EXCISION  05/22/2012   Procedure: EXCISION MASS;  Surgeon: Joyice Faster. Cornett, MD;  Location: [place];  Service: General;  Laterality: Left;  excision abdominal wall mass  . ROTATOR CUFF REPAIR      There were no vitals filed for this visit.      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  Pt. report his hamstrings and R-sided mid back are sore today following this.     Patient Stated Goals "no more pain, no more drugs"   Currently in Pain? Yes   Pain Score 9    Pain Location Leg   Pain Orientation Posterior;Right   Pain Descriptors / Indicators Aching   Pain Type Acute pain   Pain Onset Yesterday   Multiple Pain Sites No        Today's Treatment   TherEx Recumbent bike: level 2, 7 min    Manual Manual B HS, glute, piri, SKTC x30" each Foam roller to R piriformis, HS, and ITB x 1 min each    TherEx: Hooklying bridge x 10 reps; no HS pain with this thus progressed following this TrA + Bridge + Alt Hip ABD/ER with black TB x 20 reps   B Sidelying Hip Clam with black TB x 15 reps    Modalities: Moist heat to R glute: L sidelying, 10 min           PT Long Term Goals - 03/31/16 1100      PT LONG TERM GOAL #1   Title Independent with latest HEP by 05/05/16   Status On-going     PT LONG TERM GOAL #2   Title Lumbar ROM WNL in all planes  w/o pain by 05/05/16   Status On-going     PT LONG TERM GOAL #3   Title B hip strength >/= 4+/5 by 05/05/16   Status On-going     PT LONG TERM GOAL #4   Title Pt will report ability to complete all daily chores and job tasks w/o limitation due to LBP by 05/05/16   Status On-going               Plan - 04/03/16 0854    Clinical Impression Statement 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  Pt. reports his hamstrings and R-sided mid back are sore today following this.  Today's treatment focused more on hip / LE stretching and STM foam rolling due to work incident.  Pt. initial pain of 9/10 decreased to a 7/10 at R HS today following therex and moist heat.  Today's therex with more conservative approach due to work incident.     PT Treatment/Interventions Patient/family  education;Therapeutic exercise;Neuromuscular re-education;Manual techniques;Taping;Dry needling;Electrical Stimulation;Moist Heat;Ultrasound;Traction;Iontophoresis 4mg /ml Dexamethasone;Cryotherapy;ADLs/Self Care Home Management   PT Next Visit Plan Lumbar/core & proximal LE flexibilty and strengthening; Manual therapy for R piriformis with review of self-MFR techniques PRN; Modalities PRN for pain; possible Mechanical traction      Patient will benefit from skilled therapeutic intervention in order to improve the following deficits and impairments:  Pain, Impaired flexibility, Decreased strength, Improper body mechanics, Decreased activity tolerance, Difficulty walking  Visit Diagnosis: Bilateral low back pain with right-sided sciatica  Difficulty in walking, not elsewhere classified     Problem List Patient Active Problem List   Diagnosis Date Noted  . Gastroenteritis 04/02/2016  . 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 Renal lithiasis 02/21/2015  . Chest pain 12/18/2014  . Atypical chest pain 11/01/2014  . Rib pain on right side 08/17/2014  . Bursitis of left shoulder 08/12/2014  . Hyperlipidemia 07/26/2014  . 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 Preventative health care 07/26/2014  . BPH (benign prostatic hyperplasia) 12/11/2013  . Diverticulosis 10/26/2013  . Acute neck pain 06/04/2013  . Insomnia 02/25/2013  . Sciatica of right side 12/22/2012  . HTN (hypertension) 11/22/2012  . Headache 11/15/2012  . Right bundle branch block 11/15/2012  . Skin lesion of scalp 08/15/2012  . DM (diabetes mellitus), type 2 with neurological complications ([place]) 123456  . Lumbar radiculopathy 02/09/2012    Bess Harvest, PTA 04/03/2016, 9:31 AM  [facility] [address]  [place], [place], [address] Phone: [phone]   Fax:  [phone]  Name: Albert [name] MRN:  [id number] Date of Birth: [birth year]

## 2016-04-05 ENCOUNTER — Ambulatory Visit (INDEPENDENT_AMBULATORY_CARE_PROVIDER_SITE_OTHER): Payer: 59 | Admitting: Endocrinology

## 2016-04-05 ENCOUNTER — Encounter: Payer: Self-pay | Admitting: Endocrinology

## 2016-04-05 VITALS — BP 130/78 | HR 79 | Ht 72.0 in | Wt 198.0 lb

## 2016-04-05 DIAGNOSIS — E1165 Type 2 diabetes mellitus with hyperglycemia: Secondary | ICD-10-CM | POA: Diagnosis not present

## 2016-04-05 DIAGNOSIS — Z794 Long term (current) use of insulin: Secondary | ICD-10-CM | POA: Diagnosis not present

## 2016-04-05 NOTE — Patient Instructions (Addendum)
Call re: Albert [name] and [employer]  Lantus 20 units, take after dinner   Check blood sugars on waking up 3x weekly   Also check blood sugars about 2 hours after a meal and do this after different meals by rotation  Recommended blood sugar levels on waking up is 90-130 and about 2 hours after meal is 130-160  Please bring your blood sugar monitor to each visit, thank you

## 2016-04-05 NOTE — Progress Notes (Signed)
Patient ID: DEDRICK [name], male   DOB: November 24, 1955, 60 y.o.   MRN: [id number]           Reason for Appointment:  Follow-up for Type 2 Diabetes   History of Present Illness:          Date of diagnosis of type 2 diabetes mellitus:        Background history:   He thinks his blood sugars at the time of diagnosis was around 500 and he was symptomatic with increased thirst and urination He has been taking metformin for several years but also has been tried on Amaryl Since about 2013 he has been on Januvia In early 2016 his blood sugars were significantly higher with A1c 11.9 and he was started on Lantus insulin, initially 10 units Janumet was continued  Recent history:   INSULIN regimen is: 16 units Lantus hs     Prior to consultation his A1c had gone up to 9% in January  He was started on Invokana 100 mg daily In 5/17 his once a day Janumet 50/1000 was changed to XR 100/1000 to enable better tolerability He again did not bring his meter today for download  Current management, blood sugar patterns and problems identified:   He thinks his blood sugars are higher because of intercurrent illnesses or back pain but appears to be needing more insulin  Blood sugars are apparently consistently high throughout the day, he thinks they are little higher the afternoon  This is despite his recently starting back on his exercise  He thinks he is watching his diet and also trying to eat more salads in the evening  Weight has gone up slightly  Apparently postprandial readings at times are over 200, although with eating oatmeal in the morning of the lab work it was 165  Non-insulin hypoglycemic drugs the patient is taking are: Janumet XR 100/1000 at supper      Side effects from medications have been: Diarrhea from high dose metformin  Compliance with the medical regimen: Fair   Glucose monitoring:  done 1-2 times a day         Glucometer: One Touch ultra.      Blood Glucose readings by  recall  Mean values apply above for all meters except median for One Touch  PRE-MEAL Fasting Lunch Dinner Bedtime Overall  Glucose range: 150-165  160-220 190-210   Mean/median:         Self-care: The diet that the patient has been following is: tries to limit high-fat foods, carbohydrates .     Meal times are:  Breakfast is at 6 AM, Lunch: 12-1 PM Dinner: 6-7 PM   Typical meal intake: Breakfast is  usually  oatmeal, usually taking lunch from home to work               Dietician visit, most recent: 11/2015               Exercise:  At Gym Up to 45 min during the day; Cardio and weights. 3/7 days a week  Weight history:   Wt Readings from Last 3 Encounters:  04/05/16 198 lb (89.8 kg)  03/17/16 193 lb 2 oz (87.6 kg)  01/04/16 193 lb 9.6 oz (87.8 kg)    Glycemic control:   Lab Results  Component Value Date   HGBA1C 8.2 (H) 03/31/2016   HGBA1C 7.4 (H) 11/24/2015   HGBA1C 9.0 (H) 08/20/2015   Lab Results  Component Value Date   MICROALBUR <0.7  03/31/2016   LDLCALC 42 11/24/2015   CREATININE 0.85 03/31/2016    Lab on 03/31/2016  Component Date Value Ref Range Status  . Microalb, Ur 03/31/2016 <0.7  0.0 - 1.9 mg/dL Final  . Creatinine,U 03/31/2016 75.2  mg/dL Final  . Microalb Creat Ratio 03/31/2016 0.9  0.0 - 30.0 mg/g Final  . Hgb A1c MFr Bld 03/31/2016 8.2* 4.6 - 6.5 % Final  . Sodium 03/31/2016 133* 135 - 145 mEq/L Final  . Potassium 03/31/2016 4.1  3.5 - 5.1 mEq/L Final  . Chloride 03/31/2016 103  96 - 112 mEq/L Final  . CO2 03/31/2016 20  19 - 32 mEq/L Final  . Glucose, Bld 03/31/2016 168* 70 - 99 mg/dL Final  . BUN 03/31/2016 11  6 - 23 mg/dL Final  . Creatinine, Ser 03/31/2016 0.85  0.40 - 1.50 mg/dL Final  . Calcium 03/31/2016 9.2  8.4 - 10.5 mg/dL Final  . GFR 03/31/2016 98.44  >60.00 mL/min Final         Medication List       Accurate as of 04/05/16  8:51 AM. Always use your most recent med list.          aspirin EC 81 MG tablet Take 1 tablet (81  mg total) by mouth daily.   atorvastatin 40 MG tablet Commonly known as:  LIPITOR TAKE 1 TABLET BY MOUTH DAILY   B-D UF III MINI PEN NEEDLES 31G X 5 MM Misc Generic drug:  Insulin Pen Needle USE TO INJECT LANTUS DAILY   fluticasone 50 MCG/ACT nasal spray Commonly known as:  FLONASE Place 2 sprays into both nostrils daily.   glucose blood test strip Commonly known as:  BAYER CONTOUR TEST Test blood sugars as directed. Dx E11.49   ibuprofen 800 MG tablet Commonly known as:  ADVIL,MOTRIN Take 1 tablet (800 mg total) by mouth 3 (three) times daily.   Insulin Glargine 100 UNIT/ML Solostar Pen Commonly known as:  LANTUS Inject 16 Units into the skin daily.   INVOKANA 100 MG Tabs tablet Generic drug:  canagliflozin TAKE 1 TABLET BY MOUTH DAILY BEFORE BREAKFAST   lisinopril 10 MG tablet Commonly known as:  PRINIVIL,ZESTRIL TAKE 1 TABLET BY MOUTH 2 TIMES DAILY.   loperamide 2 MG tablet Commonly known as:  IMODIUM A-D Take 2 mg by mouth 4 (four) times daily as needed for diarrhea or loose stools.   multivitamin with minerals tablet Take 1 tablet by mouth daily.   mupirocin ointment 2 % Commonly known as:  BACTROBAN Place 1 application into the nose 2 (two) times daily.   ONE TOUCH ULTRA SYSTEM KIT w/Device Kit Use as directed to check blood sugar.  Diagnosis code E11.40   SitaGLIPtin-MetFORMIN HCl [phone] MG Tb24 Commonly known as:  JANUMET XR 1 tab pc supper   tadalafil 20 MG tablet Commonly known as:  CIALIS Take 1 tablet (20 mg total) by mouth daily as needed for erectile dysfunction.   tiZANidine 2 MG tablet Commonly known as:  ZANAFLEX Take 1 tablet (2 mg total) by mouth 2 (two) times daily as needed for muscle spasms.       Allergies:  Allergies  Allergen Reactions  . Contrast Media [Iodinated Diagnostic Agents] Swelling    Can take if premedicated with diphenhydramine  . Iodine Swelling    Past Medical History:  Diagnosis Date  . Colon polyps     adenomatous  . Diverticulosis 10/26/2013  . DM (diabetes mellitus), type 2 with neurological complications ([place]) 2/44/0102  .  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 History of chicken pox    childhood  . History of kidney stones   . Hyperlipidemia 07/26/2014  . Hypertension   . Insomnia 02/25/2013  . Preventative health care 07/26/2014  . Renal lithiasis 02/21/2015  . Sciatica of right side 12/22/2012  . Seasonal allergies   . Squamous cell carcinoma in situ 2014   forehead, s/p excision by derm    Past Surgical History:  Procedure Laterality Date  . ABDOMINAL HERNIA REPAIR    . CHOLECYSTECTOMY    . HERNIA REPAIR    . LAPAROSCOPIC APPENDECTOMY N/A 08/16/2014   Procedure: APPENDECTOMY LAPAROSCOPIC;  Surgeon: Erroll Luna, MD;  Location: [facility];  Service: General;  Laterality: N/A;  . MASS EXCISION  05/22/2012   Procedure: EXCISION MASS;  Surgeon: Joyice Faster. Cornett, MD;  Location: [facility];  Service: General;  Laterality: Left;  excision abdominal wall mass  . ROTATOR CUFF REPAIR      Family History  Problem Relation Age of Onset  . Lung cancer Mother   . Breast cancer Neg Hx   . Colon cancer Neg Hx   . Prostate cancer Neg Hx   . Heart disease Neg Hx   . Diabetes Maternal Aunt   . Cancer Paternal Grandmother     Social History:  reports that he has never smoked. He has never used smokeless tobacco. He reports that he does not drink alcohol or use drugs.    Review of Systems    Lipid history: On long-term treatment with statin, Followed by PCP    Lab Results  Component Value Date   CHOL 100 11/24/2015   HDL 33.40 (L) 11/24/2015   LDLCALC 42 11/24/2015   LDLDIRECT 109.0 10/26/2014   TRIG 120.0 11/24/2015   CHOLHDL 3 11/24/2015           Hypertension: Mild and taking only 10 mg lisinopril  Most recent eye exam was 1/17  Most recent foot exam: 2/17  Review of Systems    LABS:  Lab on 03/31/2016  Component Date Value  Ref Range Status  . Microalb, Ur 03/31/2016 <0.7  0.0 - 1.9 mg/dL Final  . Creatinine,U 03/31/2016 75.2  mg/dL Final  . Microalb Creat Ratio 03/31/2016 0.9  0.0 - 30.0 mg/g Final  . Hgb A1c MFr Bld 03/31/2016 8.2* 4.6 - 6.5 % Final  . Sodium 03/31/2016 133* 135 - 145 mEq/L Final  . Potassium 03/31/2016 4.1  3.5 - 5.1 mEq/L Final  . Chloride 03/31/2016 103  96 - 112 mEq/L Final  . CO2 03/31/2016 20  19 - 32 mEq/L Final  . Glucose, Bld 03/31/2016 168* 70 - 99 mg/dL Final  . BUN 03/31/2016 11  6 - 23 mg/dL Final  . Creatinine, Ser 03/31/2016 0.85  0.40 - 1.50 mg/dL Final  . Calcium 03/31/2016 9.2  8.4 - 10.5 mg/dL Final  . GFR 03/31/2016 98.44  >60.00 mL/min Final    Physical Examination:  BP 130/78   Pulse 79   Ht 6' (1.829 m)   Wt 198 lb (89.8 kg)   BMI 26.85 kg/m      ASSESSMENT:  Diabetes type 2, uncontrolled with normal  BMI See history of present illness for detailed discussion of current diabetes management, blood sugar patterns and problems identified His A1c has gone back up and now 8.2% He appears to be needing a higher insulin doses since his blood sugars are high throughout the day and slightly higher  after meals compared to fasting This is despite his generally trying to watch his diet and exercising Weight may be slightly higher Since he did not bring his monitor not clear what his blood sugar patterns are   PLAN:     He will increase Lantus to 20 units for now, try to take it at dinnertime instead of bedtime  More blood sugars after meals  He will drop his blood sugar monitor done for download tomorrow  He will check on the insurance coverage for [facility] and let us know otherwise may consider adding Victoza   Patient Instructions  Call re: W[facility] Niece and [facility]  Lantus 20 units, take after dinner   Check blood sugars on waking up 3x weekly   Also check blood sugars about 2 hours after a meal and do this after different meals by  rotation  Recommended blood sugar levels on waking up is 90-130 and about 2 hours after meal is 130-160  Please bring your blood sugar monitor to each visit, thank you       [facility] 04/05/2016, 8:51 AM   Note: This office note was prepared with Dragon voice recognition system technology. Any transcriptional errors that result from this process are unintentional.

## 2016-04-06 ENCOUNTER — Other Ambulatory Visit: Payer: Self-pay | Admitting: Endocrinology

## 2016-04-06 MED ORDER — INSULIN GLARGINE-LIXISENATIDE 100-33 UNT-MCG/ML ~~LOC~~ SOPN: 20.0000 [IU] | PEN_INJECTOR | Freq: Every day | SUBCUTANEOUS | 1 refills | Status: DC

## 2016-04-06 MED FILL — JANUMET XR 100-1,000 MG TAB: 100-1000 | 90 days supply | Qty: 90 | Fill #1

## 2016-04-06 MED FILL — tiZANidine HCL 2 MG TABS: 2 | 20 days supply | Qty: 40 | Fill #1

## 2016-04-07 ENCOUNTER — Other Ambulatory Visit: Payer: Self-pay | Admitting: Endocrinology

## 2016-04-07 MED ORDER — INSULIN GLARGINE-LIXISENATIDE 100-33 UNT-MCG/ML ~~LOC~~ SOPN: 20.0000 [IU] | PEN_INJECTOR | Freq: Every day | SUBCUTANEOUS | 1 refills | Status: DC

## 2016-04-07 MED FILL — LANTUS SOLOSTAR 100 UNITS/M: 100 | 90 days supply | Qty: 15 | Fill #0

## 2016-04-07 NOTE — Telephone Encounter (Signed)
Prescription faxed to the [facility] in [facility].

## 2016-04-07 NOTE — Telephone Encounter (Signed)
PT stated that the pharmacy has not [facility]prescription.

## 2016-04-11 MED FILL — SOLIQUA 100 UNIT-33 MCG/ML: 100-33 | 70 days supply | Qty: 15 | Fill #0

## 2016-04-12 ENCOUNTER — Ambulatory Visit: Payer: 59

## 2016-04-12 DIAGNOSIS — M5441 Lumbago with sciatica, right side: Secondary | ICD-10-CM

## 2016-04-12 DIAGNOSIS — R262 Difficulty in walking, not elsewhere classified: Secondary | ICD-10-CM | POA: Diagnosis not present

## 2016-04-12 NOTE — Therapy (Signed)
[facility] [place] [address]  [place], [place], [address] Phone: [phone]   Fax:  [phone]  Physical Therapy Treatment  Patient Details  Name: Albert [name] MRN: [id number] Date of Birth: 1955-10-08 Referring Provider: [name], MD  Encounter Date: 04/12/2016      PT End of Session - 04/12/16 1424    Visit Number 6   Number of Visits 12   Date for PT Re-Evaluation 05/05/16   PT Start Time 1402   PT Stop Time 1445   PT Time Calculation (min) 43 min   Activity Tolerance Patient tolerated treatment well   Behavior During Therapy [facility] for tasks assessed/performed      Past Medical History:  Diagnosis Date  . Colon polyps    adenomatous  . Diverticulosis 10/26/2013  . DM (diabetes mellitus), type 2 with neurological complications ([facility]) 123456  . 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 History of chicken pox    childhood  . History of kidney stones   . Hyperlipidemia 07/26/2014  . Hypertension   . Insomnia 02/25/2013  . Preventative health care 07/26/2014  . Renal lithiasis 02/21/2015  . Sciatica of right side 12/22/2012  . Seasonal allergies   . Squamous cell carcinoma in situ 2014   forehead, s/p excision by derm    Past Surgical History:  Procedure Laterality Date  . ABDOMINAL HERNIA REPAIR    . CHOLECYSTECTOMY    . HERNIA REPAIR    . LAPAROSCOPIC APPENDECTOMY N/A 08/16/2014   Procedure: APPENDECTOMY LAPAROSCOPIC;  Surgeon: Erroll Luna, MD;  Location: [facility];  Service: General;  Laterality: N/A;  . MASS EXCISION  05/22/2012   Procedure: EXCISION MASS;  Surgeon: Joyice Faster. Cornett, MD;  Location: [facility];  Service: General;  Laterality: Left;  excision abdominal wall mass  . ROTATOR CUFF REPAIR      There were no vitals filed for this visit.      Subjective Assessment - 04/12/16 1422    Subjective Pt. 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     Patient Stated Goals "no more pain, no more drugs"   Currently in Pain? No/denies   Pain Score 0-No pain   Multiple Pain Sites No       Today's treatment:  Therex: Treadmill: level 3.0, 5 min  B HS, glute, SKTC stretch x 30 sec each B sidelying clam shell with black TB x 10 reps Hooklying sustained bridge with alternating hip abd/ER with black TB (x 10 each side) 2 x 5 reps each side TRX staggered standing squat x 10 reps each way  BATCA low row 45# x 10 reps BATCA single arm low row 25# x 10 reps each  BATCA pulldown 45# x 10 reps HS curl + bridge with heels on peanut p-ball x 10 reps   * pt. with 2/10 LBP following therex         PT Long Term Goals - 03/31/16 1100      PT LONG TERM GOAL #1   Title Independent with latest HEP by 05/05/16   Status On-going     PT LONG TERM GOAL #2   Title Lumbar ROM WNL in all planes w/o pain by 05/05/16   Status On-going     PT LONG TERM GOAL #3   Title B hip strength >/= 4+/5 by 05/05/16   Status On-going  PT LONG TERM GOAL #4   Title Pt will report ability to complete all daily chores and job tasks w/o limitation due to LBP by 05/05/16   Status On-going               Plan - 04/12/16 1440    Clinical Impression Statement Pt. 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  Pt. tolerated continued lumbopelvic stability activities well today without issue and seems to be progressing, with less frequent, and less severe pain at work.     PT Treatment/Interventions Patient/family education;Therapeutic exercise;Neuromuscular re-education;Manual techniques;Taping;Dry needling;Electrical Stimulation;Moist Heat;Ultrasound;Traction;Iontophoresis 4mg /ml Dexamethasone;Cryotherapy;ADLs/Self Care Home Management   PT Next Visit Plan Lumbar/core & proximal LE flexibilty and  strengthening; Manual therapy for R piriformis with review of self-MFR techniques PRN; Modalities PRN for pain; possible Mechanical traction      Patient will benefit from skilled therapeutic intervention in order to improve the following deficits and impairments:  Pain, Impaired flexibility, Decreased strength, Improper body mechanics, Decreased activity tolerance, Difficulty walking  Visit Diagnosis: Bilateral low back pain with right-sided sciatica  Difficulty in walking, not elsewhere classified     Problem List Patient Active Problem List   Diagnosis Date Noted  . Gastroenteritis 04/02/2016  . 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 Renal lithiasis 02/21/2015  . Chest pain 12/18/2014  . Atypical chest pain 11/01/2014  . Rib pain on right side 08/17/2014  . Bursitis of left shoulder 08/12/2014  . Hyperlipidemia 07/26/2014  . 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 Preventative health care 07/26/2014  . BPH (benign prostatic hyperplasia) 12/11/2013  . Diverticulosis 10/26/2013  . Acute neck pain 06/04/2013  . Insomnia 02/25/2013  . Sciatica of right side 12/22/2012  . HTN (hypertension) 11/22/2012  . Headache 11/15/2012  . Right bundle branch block 11/15/2012  . Skin lesion of scalp 08/15/2012  . DM (diabetes mellitus), type 2 with neurological complications ([facility]) 123456  . Lumbar radiculopathy 02/09/2012    Bess Harvest, PTA 04/12/2016, 6:20 PM  [facility] [address]  [place], [place], [address] Phone: [phone]   Fax:  [phone]  Name: Albert [name] MRN: [id number] Date of Birth: [birth year]

## 2016-04-14 ENCOUNTER — Ambulatory Visit: Payer: 59 | Attending: Family Medicine | Admitting: Physical Therapy

## 2016-04-14 DIAGNOSIS — R262 Difficulty in walking, not elsewhere classified: Secondary | ICD-10-CM | POA: Insufficient documentation

## 2016-04-14 DIAGNOSIS — M5441 Lumbago with sciatica, right side: Secondary | ICD-10-CM | POA: Diagnosis not present

## 2016-04-14 NOTE — Therapy (Addendum)
[facility] [place] [address]  [place], [place], [address] Phone: [phone]   Fax:  [phone]  Physical Therapy Treatment  Patient Details  Name: Albert [name] MRN: [id number] Date of Birth: 02-10-1956 Referring Provider: [name], MD  Encounter Date: 04/14/2016      PT End of Session - 04/14/16 0846    Visit Number 7   Number of Visits 12   Date for PT Re-Evaluation 05/05/16   PT Start Time 0846   PT Stop Time 0928   PT Time Calculation (min) 42 min   Activity Tolerance Patient tolerated treatment well   Behavior During Therapy [facility] for tasks assessed/performed      Past Medical History:  Diagnosis Date  . Colon polyps    adenomatous  . Diverticulosis 10/26/2013  . DM (diabetes mellitus), type 2 with neurological complications ([facility]) 1/60/1093  . 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 History of chicken pox    childhood  . History of kidney stones   . Hyperlipidemia 07/26/2014  . Hypertension   . Insomnia 02/25/2013  . Preventative health care 07/26/2014  . Renal lithiasis 02/21/2015  . Sciatica of right side 12/22/2012  . Seasonal allergies   . Squamous cell carcinoma in situ 2014   forehead, s/p excision by derm    Past Surgical History:  Procedure Laterality Date  . ABDOMINAL HERNIA REPAIR    . CHOLECYSTECTOMY    . HERNIA REPAIR    . LAPAROSCOPIC APPENDECTOMY N/A 08/16/2014   Procedure: APPENDECTOMY LAPAROSCOPIC;  Surgeon: Erroll Luna, MD;  Location: [facility];  Service: General;  Laterality: N/A;  . MASS EXCISION  05/22/2012   Procedure: EXCISION MASS;  Surgeon: Joyice Faster. Cornett, MD;  Location: [facility];  Service: General;  Laterality: Left;  excision abdominal wall mass  . ROTATOR CUFF REPAIR      There were no vitals filed for this visit.      Subjective Assessment - 04/14/16 0849    Subjective Pt reports pain typically no higher than 1/10 at this  point, usually in R buttock after sitting for prolonged period.   Patient Stated Goals "no more pain, no more drugs"   Currently in Pain? No/denies           Today's Treatment  TherEx Treadmill - 3.0 mph x 5 min   Manual Manual B HS, glute, piri, SKTC x30" each STM/DTM & IASTM with 4" ball to R piriformis & ITB in L sidelying  Kinesiotape to R piriformis (measured & cut by PT, but applied by Yevonne Pax, PTA) - 2 "I" strips - 30% along piriformis muscle & 50% perpendicular at area of most tenderness  TherEx Staggered standing:     B Low Row with black TB 2x15    R/L Single arm upward row + slight trunk/hip rotation with black TB x15 each R/L Doorway lunge x10 each B Sidestepping in partial squat with green TB 2x25 ft [employer] walk with green TB 2x25 ft          PT Education - 04/14/16 0900    Education provided Yes   Education Details Mechanism of action for kinesiotape   Person(s) Educated Patient   Methods Explanation;Demonstration   Comprehension Verbalized understanding             PT Long Term Goals - 04/14/16 0912      PT LONG TERM GOAL #1   Title Independent with latest  HEP by 05/05/16   Status On-going     PT LONG TERM GOAL #2   Title Lumbar ROM WNL in all planes w/o pain by 05/05/16   Status On-going     PT LONG TERM GOAL #3   Title B hip strength >/= 4+/5 by 05/05/16   Status On-going     PT LONG TERM GOAL #4   Title Pt will report ability to complete all daily chores and job tasks w/o limitation due to LBP by 05/05/16   Status On-going               Plan - 04/14/16 0912    Clinical Impression Statement Pt noting overall improvement with PT with typical pain no greater than 1/10, typically after prolonged sitting. Current pain appears to be consistent with piriformis syndrome with continued significant ttp noted over R piriformis. Continued with STM/DTM to the area and initiated trial of kinesiotaping to R piriformis with tape  applied by male PTA.   PT Treatment/Interventions Patient/family education;Therapeutic exercise;Neuromuscular re-education;Manual techniques;Taping;Dry needling;Electrical Stimulation;Moist Heat;Ultrasound;Traction;Iontophoresis 4mg/ml Dexamethasone;Cryotherapy;ADLs/Self Care Home Management   PT Next Visit Plan Assess response to taping; Lumbar/core & proximal LE flexibilty and strengthening; Manual therapy for R piriformis with review of self-MFR techniques PRN; Continue taping PRN if benefit noted; Modalities PRN for pain   Consulted and Agree with Plan of Care Patient      Patient will benefit from skilled therapeutic intervention in order to improve the following deficits and impairments:  Pain, Impaired flexibility, Decreased strength, Improper body mechanics, Decreased activity tolerance, Difficulty walking  Visit Diagnosis: Bilateral low back pain with right-sided sciatica  Difficulty in walking, not elsewhere classified     Problem List Patient Active Problem List   Diagnosis Date Noted  . Gastroenteritis 04/02/2016  . 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 Renal lithiasis 02/21/2015  . Chest pain 12/18/2014  . Atypical chest pain 11/01/2014  . Rib pain on right side 08/17/2014  . Bursitis of left shoulder 08/12/2014  . Hyperlipidemia 07/26/2014  . 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 Preventative health care 07/26/2014  . BPH (benign prostatic hyperplasia) 12/11/2013  . Diverticulosis 10/26/2013  . Acute neck pain 06/04/2013  . Insomnia 02/25/2013  . Sciatica of right side 12/22/2012  . HTN (hypertension) 11/22/2012  . Headache 11/15/2012  . Right bundle branch block 11/15/2012  . Skin lesion of scalp 08/15/2012  . DM (diabetes mellitus), type 2 with neurological complications ([facility]) 02/09/2012  . Lumbar radiculopathy 02/09/2012     M , PT, MPT 04/14/2016, 9:32 AM  [facility]  [facility] [place] [address]  [address] [place], [place], [address] Phone: [phone]   Fax:  [phone]  Name: Albert [name] MRN: [id number] Date of Birth: 03/12/1956   PHYSICAL THERAPY DISCHARGE SUMMARY  Visits from Start of Care: 7  Current functional level related to goals / functional outcomes:    Pt has not returned to PT since 04/14/16 and when contacted via MyChart regarding continuing PT, pt requested to be discharged noting he was doing well without pain. Unable to formally assess status at discharge due to failure to return, but as of last visit had been progressing well toward goals.   Remaining deficits:   Unable to assess.   Education / Equipment:   HEP  Plan: Patient agrees to discharge.  Patient goals were partially met. Patient is being discharged due to being pleased with the current functional level.  ?????        M. , PT, MPT 05/08/16, 4:12 PM  [facility] [place] [address]  [address] [place], [place], [address] Phone: [phone]   Fax:  [phone]    

## 2016-04-28 MED FILL — INVOKANA 100 MG TABLET: 100 | 30 days supply | Qty: 30 | Fill #3

## 2016-05-02 MED FILL — CONTOUR NEXT STRIPS: 50 days supply | Qty: 100 | Fill #1

## 2016-05-08 ENCOUNTER — Encounter: Payer: Self-pay | Admitting: Family Medicine

## 2016-05-08 MED ORDER — TIZANIDINE HCL 2 MG PO TABS: 2.0000 mg | ORAL_TABLET | Freq: Two times a day (BID) | ORAL | 0 refills | Status: DC | PRN

## 2016-05-08 MED FILL — tiZANidine HCL 2 MG TABS: 2 | 20 days supply | Qty: 40 | Fill #0

## 2016-05-23 ENCOUNTER — Ambulatory Visit: Payer: Self-pay | Admitting: Endocrinology

## 2016-05-23 ENCOUNTER — Other Ambulatory Visit: Payer: Self-pay | Admitting: Endocrinology

## 2016-05-23 MED FILL — INVOKANA 100 MG TABLET: 100 | 30 days supply | Qty: 30 | Fill #0

## 2016-05-25 MED FILL — CIALIS 20 MG TABLET: 20 | 20 days supply | Qty: 4 | Fill #6

## 2016-05-26 ENCOUNTER — Other Ambulatory Visit (INDEPENDENT_AMBULATORY_CARE_PROVIDER_SITE_OTHER): Payer: 59

## 2016-05-26 DIAGNOSIS — E1165 Type 2 diabetes mellitus with hyperglycemia: Secondary | ICD-10-CM

## 2016-05-26 DIAGNOSIS — Z794 Long term (current) use of insulin: Secondary | ICD-10-CM | POA: Diagnosis not present

## 2016-05-26 LAB — BASIC METABOLIC PANEL
BUN: 17 mg/dL (ref 6–23)
CO2: 26 meq/L (ref 19–32)
Calcium: 9.9 mg/dL (ref 8.4–10.5)
Chloride: 103 mEq/L (ref 96–112)
Creatinine, Ser: 1.07 mg/dL (ref 0.40–1.50)
GFR: 75.43 mL/min (ref >60.00)
GLUCOSE: 152 mg/dL — AB (ref 70–99)
Potassium: 4.7 mEq/L (ref 3.5–5.1)
Sodium: 138 mEq/L (ref 135–145)

## 2016-05-27 LAB — FRUCTOSAMINE: Fructosamine: 286 umol/L — ABNORMAL HIGH (ref 0–285)

## 2016-05-31 ENCOUNTER — Encounter: Payer: Self-pay | Admitting: Endocrinology

## 2016-05-31 ENCOUNTER — Ambulatory Visit (INDEPENDENT_AMBULATORY_CARE_PROVIDER_SITE_OTHER): Payer: 59 | Admitting: Endocrinology

## 2016-05-31 VITALS — BP 110/68 | HR 93 | Temp 98.1°F | Resp 16 | Ht 72.0 in | Wt 198.6 lb

## 2016-05-31 DIAGNOSIS — E1165 Type 2 diabetes mellitus with hyperglycemia: Secondary | ICD-10-CM | POA: Diagnosis not present

## 2016-05-31 DIAGNOSIS — Z794 Long term (current) use of insulin: Secondary | ICD-10-CM | POA: Diagnosis not present

## 2016-05-31 NOTE — Patient Instructions (Signed)
Soliqua 22 units, keep am sugar 120-130  Protein at [facility]

## 2016-05-31 NOTE — Progress Notes (Signed)
[name], male   DOB: 03-12-1956, 60 y.o.   MRN: [id number]           Reason for Appointment:  Follow-up for Type 2 Diabetes   History of Present Illness:          Date of diagnosis of type 2 diabetes mellitus:        Background history:   He thinks his blood sugars at the time of diagnosis was around 500 and he was symptomatic with increased thirst and urination He has been taking metformin for several years but also has been tried on Amaryl Since about 2013 he has been on Januvia In early 2016 his blood sugars were significantly higher with A1c 11.9 and he was started on Lantus insulin, initially 10 units Janumet was continued  Recent history:   INSULIN regimen is:  Soliqua 20 units daily in a.m.  Prior to consultation his A1c had gone up to 9% in January  He was started on Invokana 100 mg daily at that time In 8/17 because of overall high readings especially postprandially was changed from Lantus to [facility]  Current management, blood sugar patterns and problems identified:   He has increased his Soliqua from 15 up to 20 units  Although his blood sugars at breakfast were down to as low as 104 they seem to be generally higher now, highest 192 yesterday  Again he has not done many readings after meals and only a few readings at lunchtime or rarely in the afternoon or bedtime  FRUCTOSAMINE of 286 indicates better control  He has still been able to be fairly active  He does not usually have protein in breakfast and tends to get hungry later in the morning  Non-insulin hypoglycemic drugs the patient is taking are: Janumet XR 100/1000 at supper      Side effects from medications have been: Diarrhea from high dose metformin  Compliance with the medical regimen: Fair   Glucose monitoring:  done 1-2 times a day         Glucometer: One Touch ultra.      Blood Glucose readings by download  Mean values apply above for all meters except median for One  Touch  PRE-MEAL Fasting Lunch Dinner Bedtime Overall  Glucose range: 104-192    160    Mean/median: 138 138   160  137     Self-care: The diet that the patient has been following is: tries to limit high-fat foods, carbohydrates .     Meal times are:  Breakfast is at 6 AM, Lunch: 12-1 PM Dinner: 6-7 PM   Typical meal intake: Breakfast is usually  oatmeal, usually taking lunch from home to work               Dietician visit, most recent: 11/2015               Exercise:  At Gym Up to 45 min during the day; Cardio and weights. 3/7 days a week  Weight history:   Wt Readings from Last 3 Encounters:  05/31/16 198 lb 9.6 oz (90.1 kg)  04/05/16 198 lb (89.8 kg)  03/17/16 193 lb 2 oz (87.6 kg)    Glycemic control:   Lab Results  Component Value Date   HGBA1C 8.2 (H) 03/31/2016   HGBA1C 7.4 (H) 11/24/2015   HGBA1C 9.0 (H) 08/20/2015   Lab Results  Component Value Date   MICROALBUR <0.7 03/31/2016   LDLCALC 42 11/24/2015  CREATININE 1.07 05/26/2016    Lab on 05/26/2016  Component Date Value Ref Range Status  . Sodium 05/26/2016 138  135 - 145 mEq/L Final  . Potassium 05/26/2016 4.7  3.5 - 5.1 mEq/L Final  . Chloride 05/26/2016 103  96 - 112 mEq/L Final  . CO2 05/26/2016 26  19 - 32 mEq/L Final  . Glucose, Bld 05/26/2016 152* 70 - 99 mg/dL Final  . BUN 05/26/2016 17  6 - 23 mg/dL Final  . Creatinine, Ser 05/26/2016 1.07  0.40 - 1.50 mg/dL Final  . Calcium 05/26/2016 9.9  8.4 - 10.5 mg/dL Final  . GFR 05/26/2016 75.43  >60.00 mL/min Final  . Fructosamine 05/27/2016 286* 0 - 285 umol/L Final   Comment: Published reference interval for apparently healthy subjects between age 2 and 54 is 66 - 285 umol/L and in a poorly controlled diabetic population is 228 - 563 umol/L with a mean of 396 umol/L.          Medication List       Accurate as of 05/31/16  9:41 AM. Always use your most recent med list.          aspirin EC 81 MG tablet Take 1 tablet (81 mg total) by  mouth daily.   atorvastatin 40 MG tablet Commonly known as:  LIPITOR TAKE 1 TABLET BY MOUTH DAILY   B-D UF III MINI PEN NEEDLES 31G X 5 MM Misc Generic drug:  Insulin Pen Needle USE TO INJECT LANTUS DAILY   fluticasone 50 MCG/ACT nasal spray Commonly known as:  FLONASE Place 2 sprays into both nostrils daily.   glucose blood test strip Commonly known as:  BAYER CONTOUR TEST Test blood sugars as directed. Dx E11.49   ibuprofen 800 MG tablet Commonly known as:  ADVIL,MOTRIN Take 1 tablet (800 mg total) by mouth 3 (three) times daily.   Insulin Glargine-Lixisenatide 100-33 UNT-MCG/ML Sopn Commonly known as:  SOLIQUA Inject 20 Units into the skin daily with breakfast. Start with 15 units daily in the morning, increase by 2 units every week until morning sugar is below 120   INVOKANA 100 MG Tabs tablet Generic drug:  canagliflozin TAKE 1 TABLET BY MOUTH DAILY BEFORE BREAKFAST   lisinopril 10 MG tablet Commonly known as:  PRINIVIL,ZESTRIL TAKE 1 TABLET BY MOUTH 2 TIMES DAILY.   loperamide 2 MG tablet Commonly known as:  IMODIUM A-D Take 2 mg by mouth 4 (four) times daily as needed for diarrhea or loose stools.   multivitamin with minerals tablet Take 1 tablet by mouth daily.   mupirocin ointment 2 % Commonly known as:  BACTROBAN Place 1 application into the nose 2 (two) times daily.   ONE TOUCH ULTRA SYSTEM KIT w/Device Kit Use as directed to check blood sugar.  Diagnosis code E11.40   SitaGLIPtin-MetFORMIN HCl [phone] MG Tb24 Commonly known as:  JANUMET XR 1 tab pc supper       Allergies:  Allergies  Allergen Reactions  . Contrast Media [Iodinated Diagnostic Agents] Swelling    Can take if premedicated with diphenhydramine  . Iodine Swelling    Past Medical History:  Diagnosis Date  . Colon polyps    adenomatous  . Diverticulosis 10/26/2013  . DM (diabetes mellitus), type 2 with neurological complications ([place]) 04/14/5175  . Hereditary and idiopathic  peripheral neuropathy 07/26/2014  . History of chicken pox    childhood  . History of kidney stones   . Hyperlipidemia 07/26/2014  . Hypertension   . Insomnia  02/25/2013  . Preventative health care 07/26/2014  . Renal lithiasis 02/21/2015  . Sciatica of right side 12/22/2012  . Seasonal allergies   . Squamous cell carcinoma in situ 2014   forehead, s/p excision by derm    Past Surgical History:  Procedure Laterality Date  . ABDOMINAL HERNIA REPAIR    . CHOLECYSTECTOMY    . HERNIA REPAIR    . LAPAROSCOPIC APPENDECTOMY N/A 08/16/2014   Procedure: APPENDECTOMY LAPAROSCOPIC;  Surgeon: Erroll Luna, MD;  Location: [place];  Service: General;  Laterality: N/A;  . MASS EXCISION  05/22/2012   Procedure: EXCISION MASS;  Surgeon: Joyice Faster. Cornett, MD;  Location: [place];  Service: General;  Laterality: Left;  excision abdominal wall mass  . ROTATOR CUFF REPAIR      Family History  Problem Relation Age of Onset  . Lung cancer Mother   . Breast cancer Neg Hx   . Colon cancer Neg Hx   . Prostate cancer Neg Hx   . Heart disease Neg Hx   . Diabetes Maternal Aunt   . Cancer Paternal Grandmother     Social History:  reports that he has never smoked. He has never used smokeless tobacco. He reports that he does not drink alcohol or use drugs.    Review of Systems    Lipid history: On long-term treatment with statin, Followed by PCP    Lab Results  Component Value Date   CHOL 100 11/24/2015   HDL 33.40 (L) 11/24/2015   LDLCALC 42 11/24/2015   LDLDIRECT 109.0 10/26/2014   TRIG 120.0 11/24/2015   CHOLHDL 3 11/24/2015           Hypertension: Mild and taking only 10 mg lisinopril  Most recent eye exam was 1/17  Most recent foot exam: 2/17  ED  Review of Systems    LABS:  Lab on 05/26/2016  Component Date Value Ref Range Status  . Sodium 05/26/2016 138  135 - 145 mEq/L Final  . Potassium 05/26/2016 4.7  3.5 - 5.1 mEq/L Final  . Chloride 05/26/2016 103  96  - 112 mEq/L Final  . CO2 05/26/2016 26  19 - 32 mEq/L Final  . Glucose, Bld 05/26/2016 152* 70 - 99 mg/dL Final  . BUN 05/26/2016 17  6 - 23 mg/dL Final  . Creatinine, Ser 05/26/2016 1.07  0.40 - 1.50 mg/dL Final  . Calcium 05/26/2016 9.9  8.4 - 10.5 mg/dL Final  . GFR 05/26/2016 75.43  >60.00 mL/min Final  . Fructosamine 05/27/2016 286* 0 - 285 umol/L Final   Comment: Published reference interval for apparently healthy subjects between age 79 and 66 is 21 - 285 umol/L and in a poorly controlled diabetic population is 228 - 563 umol/L with a mean of 396 umol/L.     Physical Examination:  BP 110/68   Pulse 93   Temp 98.1 F (36.7 C)   Resp 16   Ht 6' (1.829 m)   Wt 198 lb 9.6 oz (90.1 kg)   SpO2 98%   BMI 26.94 kg/m      ASSESSMENT:  Diabetes type 2, uncontrolled with normal  BMI See history of present illness for detailed discussion of current diabetes management, blood sugar patterns and problems identified With starting Soliqua his blood sugars overall appear to be better although he has not done enough postprandial readings Fasting readings are recently averaging 148  PLAN:     He will increase Soliqua up to 22 units at least  and target fasting blood sugars under 130  No change in other medications including Janumet for now  More blood sugars after meals  Take a protein with breakfast  He will start the diabetes education program with the [facility]  A1c in 3 months   Patient Instructions  Soliqua 22 units, keep am sugar 120-130  Protein at [facility] 05/31/2016, 9:41 AM   Note: This office note was prepared with Dragon voice recognition system technology. Any transcriptional errors that result from this process are unintentional.

## 2016-06-01 ENCOUNTER — Ambulatory Visit: Payer: Self-pay | Admitting: Family Medicine

## 2016-06-13 ENCOUNTER — Encounter: Payer: Self-pay | Admitting: *Deleted

## 2016-06-13 ENCOUNTER — Ambulatory Visit (INDEPENDENT_AMBULATORY_CARE_PROVIDER_SITE_OTHER): Payer: 59 | Admitting: Family Medicine

## 2016-06-13 ENCOUNTER — Other Ambulatory Visit: Payer: Self-pay | Admitting: *Deleted

## 2016-06-13 ENCOUNTER — Encounter: Payer: Self-pay | Admitting: Family Medicine

## 2016-06-13 VITALS — BP 118/72 | HR 95 | Temp 98.4°F | Ht 72.0 in | Wt 195.0 lb

## 2016-06-13 VITALS — BP 110/84 | HR 97 | Ht 72.0 in | Wt 196.0 lb

## 2016-06-13 DIAGNOSIS — Z794 Long term (current) use of insulin: Principal | ICD-10-CM

## 2016-06-13 DIAGNOSIS — E782 Mixed hyperlipidemia: Secondary | ICD-10-CM

## 2016-06-13 DIAGNOSIS — Z0000 Encounter for general adult medical examination without abnormal findings: Secondary | ICD-10-CM

## 2016-06-13 DIAGNOSIS — I10 Essential (primary) hypertension: Secondary | ICD-10-CM | POA: Diagnosis not present

## 2016-06-13 DIAGNOSIS — N529 Male erectile dysfunction, unspecified: Secondary | ICD-10-CM | POA: Diagnosis not present

## 2016-06-13 DIAGNOSIS — E1149 Type 2 diabetes mellitus with other diabetic neurological complication: Secondary | ICD-10-CM

## 2016-06-13 DIAGNOSIS — E11319 Type 2 diabetes mellitus with unspecified diabetic retinopathy without macular edema: Secondary | ICD-10-CM

## 2016-06-13 MED ORDER — VARDENAFIL HCL 10 MG PO TABS: 5.0000 mg | ORAL_TABLET | Freq: Every day | ORAL | 2 refills | Status: DC | PRN

## 2016-06-13 NOTE — Patient Outreach (Addendum)
[name]) Care Management   06/13/2016  [name] Sep 03, 1955 [id number]  Albert [name] is an 60 y.o. male who presents to the [place] [facility] office to enroll in the Link To Wellness program for self management assistance with Type II DM, HTN and hyperlipidemia.  Subjective: Annette says he was referred to the [facility] To Wellness program by pharmacist Luetta Nutting. He says he was diagnosed with Type II DM, HTN and hyperlipidemia in 1996. He reports his fasting blood sugar variance as 78-120, he defines his hypoglycemic threshold at <60. He says he follows a low salt (uses Mrs Deliah Boston) , CHO controlled meal plan but says pasta is one of his favorite meals and he is having a hard time finding low CHO pasta. He says he drinks whole milk and whole fat dairy products.  He does no formal exercise although he use to lift weights. He says he walks between 8-12 miles at work. He was referred to Dr. Dwyane Dee in February 2017 by his primary care provider, Dr. Charlett Blake, to assist with improved glycemic control as Zavien says his A1C usually runs between 7.8%-8.5%. He says he had no formal DM education at diagnosis but he did met with a registered dietician in March for assistance with dietary management of his diabetes.  He refused offer to check a POC Hgb A1C today.   Objective:   Review of Systems  Constitutional: Negative.     Physical Exam  Constitutional: He is oriented to person, place, and time. He appears well-developed and well-nourished.  Respiratory: Effort normal.  Neurological: He is alert and oriented to person, place, and time.  Skin: Skin is warm and dry.  Psychiatric: He has a normal mood and affect. His behavior is normal. Judgment and thought content normal.   Filed Weights   06/13/16 0853  Weight: 196 lb (88.9 kg)   Vitals:   06/13/16 0853  BP: 110/84  Pulse: 97    Encounter Medications:   Outpatient Encounter Prescriptions as of  06/13/2016  Medication Sig Note  . aspirin EC 81 MG tablet Take 1 tablet (81 mg total) by mouth daily.   Marland Kitchen atorvastatin (LIPITOR) 40 MG tablet TAKE 1 TABLET BY MOUTH DAILY   . B-D UF III MINI PEN NEEDLES 31G X 5 MM MISC USE TO INJECT LANTUS DAILY   . Blood Glucose Monitoring Suppl (ONE TOUCH ULTRA SYSTEM KIT) W/DEVICE KIT Use as directed to check blood sugar.  Diagnosis code E11.40   . fluticasone (FLONASE) 50 MCG/ACT nasal spray Place 2 sprays into both nostrils daily. 06/13/2016: Takes as needed  . ibuprofen (ADVIL,MOTRIN) 800 MG tablet Take 1 tablet (800 mg total) by mouth 3 (three) times daily.   . Insulin Glargine-Lixisenatide (SOLIQUA) 100-33 UNT-MCG/ML SOPN Inject 20 Units into the skin daily with breakfast. Start with 15 units daily in the morning, increase by 2 units every week until morning sugar is below 120   . INVOKANA 100 MG TABS tablet TAKE 1 TABLET BY MOUTH DAILY BEFORE BREAKFAST   . lisinopril (PRINIVIL,ZESTRIL) 10 MG tablet TAKE 1 TABLET BY MOUTH 2 TIMES DAILY.   . Multiple Vitamins-Minerals (MULTIVITAMIN WITH MINERALS) tablet Take 1 tablet by mouth daily.   . mupirocin ointment (BACTROBAN) 2 % Place 1 application into the nose 2 (two) times daily.   . SitaGLIPtin-MetFORMIN HCl (JANUMET XR) [phone] MG TB24 1 tab pc supper   . sodium chloride (OCEAN) 0.65 % SOLN nasal spray Place 1 spray  into both nostrils as needed for congestion.   Marland Kitchen glucose blood (BAYER CONTOUR TEST) test strip Test blood sugars as directed. Dx E11.49   . loperamide (IMODIUM A-D) 2 MG tablet Take 2 mg by mouth 4 (four) times daily as needed for diarrhea or loose stools.   . tadalafil (CIALIS) 20 MG tablet Take 20 mg by mouth daily as needed for erectile dysfunction.    No facility-administered encounter medications on file as of 06/13/2016.     Functional Status:   In your present state of health, do you have any difficulty performing the following activities: 06/13/2016 07/20/2015  Hearing? N N  Vision?  N N  Difficulty concentrating or making decisions? N N  Walking or climbing stairs? N N  Dressing or bathing? N N  Doing errands, shopping? N N  Preparing Food and eating ? N -  In the past six months, have you accidently leaked urine? N -  Do you have problems with loss of bowel control? N -  Managing your Medications? N -  Managing your Finances? N -  Housekeeping or managing your Housekeeping? 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 Currently meeting treatment targets for HTN and lipids but Hgb A1C not meeting treatment target as evidenced by Hgb A1C= 8.2% on 03/31/16.  Plan:  [facility] CM Care Plan Problem One   Flowsheet Row Most Recent Value  Care Plan Problem One  Patient with Type 2 DM, HTN and Hyperlipidemia meeting treatment targets for HTN and Lipids but Hgb A1C not at target as evidenced by Hgb A1C= 8.2% on 03/31/16   Role Documenting the Problem One  Care Management [place] for Problem One  Active  [facility] Long Term Goal (31-90 days)  Improved glycemic control as evidenced by improved Hgb A1C with 75% of self monitored blood sugars meeting target, ongoing good control of HTN and hyperlipidemia as evidenced by normal lipid profile and BP readings consistently <140/<90  [facility] Long Term Goal Start Date  06/13/16  Interventions for Problem One Long Term Goal  Discussed Link to Wellness program goals, requirements and benefits, reviewed member's rights and responsibilities ,provided diabetes information packet with explanation of contents, ensured member agreed and signed consent to participate and authorization to release and receive health information, consent, participation agreement  and consent to enroll in program, assessed member's current knowledge of diabetes, faxed [facility] for enrollment in to the required type II DM core classes, using the [place] representation, discussed the 8 core pathophysiologic deficits in Type II diabetes. Discussed physiology of diabetes as a chronic progressive disease with the initial problem of insulin resistance in the muscle, liver and fat cells and then increased loss of beta cell function over time resulting in decreased insulin production, discussed role of obesity/being overweight, especially abdominal (visceral) obesity, on insulin resistance, reviewed patient's medications and assessed medication adherence,  reinforced importance of taking all medications as prescribed, provided education on carb counting, importance of regularly scheduled meals/snacks, and meal planning, reviewed approximate amount of CHOs to aim for at meals as discussed when he saw the RD in March, suggested  he check [employer] for low CHO pasta options, discussed effects of physical activity on glucose levels and long-term glucose control by improving insulin sensitivity and assisting with weight management and cardiovascular health,reviewed American Diabetes Association recommendations of 150 minutes of exercise per week including two sessions of resistance exercise weekly,reviewed patient's meter history and/or CBG log, discussed blood glucose monitoring and interpretation, discussed recommended target ranges for pre-meal and post-meal, provided blood sugar log sheets with targets for pre and post meal,reviewed definitions of hyperglycemia and hypoglycemia, assessed patient's hypoglycemia threshold and usual symptoms, discussed causes, other symptoms, and treatment options (Rule of 15s to treat hypoglycemia), discussed the role of stress on blood sugar,discussed the role of prolonged elevated glucose levels on body systems,  discussed recommendations for day to day and long-term diabetes self-care, reviewed recommended daily foot checks, and yearly cholesterol, urine, and eye testing, and recommendations for medical, dental, and emotional self-care,  discussed the role of prolonged elevated glucose levels on body systems, discussed recommendations for day to day and long-term diabetes self-care, reviewed recommended daily foot checks, and yearly cholesterol, urine, and eye testing, and recommendations for medical, dental, and emotional self-care,reviewed upcoming appointments with patient's primary care MD later today and with his endocrinologist on 08/31/16 and for labs on 08/28/16, reinforced the importance of keeping the appointment,  encouraged patient to write questions/concerns in advance to discuss with providers, explained [employer] and provided brochure and instructed [place] on enrollment procedure. Explained that disease self management follow up will be provided via [place] beginning 08/14/16.      Will fax today's note to Dr. Charlett Blake and Dr. Dwyane Dee.  [name] RN,CCM,CDE [place] Management Coordinator Link To Wellness Office Phone [phone] Office Fax [phone]

## 2016-06-13 NOTE — Progress Notes (Signed)
Pre visit review using our clinic review tool, if applicable. No additional management support is needed unless otherwise documented below in the visit note. 

## 2016-06-13 NOTE — Patient Instructions (Signed)

## 2016-06-14 ENCOUNTER — Encounter: Payer: Self-pay | Admitting: *Deleted

## 2016-06-16 ENCOUNTER — Telehealth: Payer: Self-pay | Admitting: Family Medicine

## 2016-06-16 MED FILL — SOLIQUA 100 UNIT-33 MCG/ML: 100-33 | 70 days supply | Qty: 15 | Fill #1

## 2016-06-16 NOTE — Telephone Encounter (Signed)
°  Relation to [id number] Call back number:[phone] Pharmacy:  Reason for call: pt want to inform Dr. Charlett Blake that the rx vardenafil (LEVITRA) 10 MG tablet, co pay was $75 and he does not want to pay that, states not to worry about he is not going to take anything.

## 2016-06-18 NOTE — Progress Notes (Signed)
[name], male   DOB: 1956-03-20, 60 y.o.   MRN: [id number]   Subjective:    [name], male    DOB: [birth year], 60 y.o.   MRN: [id number]  Chief Complaint  Patient presents with  . Follow-up    HPI Patient is in today for follow up accompanied by his wife. Overall he is doing well. He notes the Cialis has not been helpful. He is unable to achieve completion. No recent illness or hospitalization. Denies CP/palp/SOB/HA/congestion/fevers/GI or GU c/o. Taking meds as prescribed  Past Medical History:  Diagnosis Date  . Colon polyps    adenomatous  . Diverticulosis 10/26/2013  . DM (diabetes mellitus), type 2 with neurological complications ([place]) 3/88/8280  . 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 History of chicken pox    childhood  . History of kidney stones   . Hyperlipidemia 07/26/2014  . Hypertension   . Insomnia 02/25/2013  . Preventative health care 07/26/2014  . Renal lithiasis 02/21/2015  . Sciatica of right side 12/22/2012  . Seasonal allergies   . Squamous cell carcinoma in situ 2014   forehead, s/p excision by derm    Past Surgical History:  Procedure Laterality Date  . ABDOMINAL HERNIA REPAIR    . CHOLECYSTECTOMY    . HERNIA REPAIR    . LAPAROSCOPIC APPENDECTOMY N/A 08/16/2014   Procedure: APPENDECTOMY LAPAROSCOPIC;  Surgeon: Erroll Luna, MD;  Location: [facility];  Service: General;  Laterality: N/A;  . MASS EXCISION  05/22/2012   Procedure: EXCISION MASS;  Surgeon: Joyice Faster. Cornett, MD;  Location: [place];  Service: General;  Laterality: Left;  excision abdominal wall mass  . ROTATOR CUFF REPAIR      Family History  Problem Relation Age of Onset  . Lung cancer Mother   . Breast cancer Neg Hx   . Colon cancer Neg Hx   . Prostate cancer Neg Hx   . Heart disease Neg Hx   . Diabetes Maternal Aunt   . Cancer Paternal Grandmother     Social History   Social History  . Marital status:  Married    Spouse name: N/A  . Number of children: 3  . Years of education: N/A   Occupational History  .      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 Smoking status: Never Smoker  . Smokeless tobacco: Never Used  . Alcohol use No  . Drug use: No  . Sexual activity: Not on file   Other Topics Concern  . Not on file   Social History Narrative  . No narrative on file    Outpatient Medications Prior to Visit  Medication Sig Dispense Refill  . aspirin EC 81 MG tablet Take 1 tablet (81 mg total) by mouth daily.    Marland Kitchen atorvastatin (LIPITOR) 40 MG tablet TAKE 1 TABLET BY MOUTH DAILY 90 tablet 1  . B-D UF III MINI PEN NEEDLES 31G X 5 MM MISC USE TO INJECT LANTUS DAILY 100 each 2  . Blood Glucose Monitoring Suppl (ONE TOUCH ULTRA SYSTEM KIT) W/DEVICE KIT Use as directed to check blood sugar.  Diagnosis code E11.40    . fluticasone (FLONASE) 50 MCG/ACT nasal spray Place 2 sprays into both nostrils daily. 16 g 1  . glucose blood (BAYER CONTOUR TEST) test strip Test blood sugars as directed. Dx E11.49 100 each 12  . ibuprofen (ADVIL,MOTRIN) 800 MG tablet Take 1 tablet (800  mg total) by mouth 3 (three) times daily. 21 tablet 0  . Insulin Glargine-Lixisenatide (SOLIQUA) 100-33 UNT-MCG/ML SOPN Inject 20 Units into the skin daily with breakfast. Start with 15 units daily in the morning, increase by 2 units every week until morning sugar is below 120 5 pen 1  . INVOKANA 100 MG TABS tablet TAKE 1 TABLET BY MOUTH DAILY BEFORE BREAKFAST 30 tablet 3  . lisinopril (PRINIVIL,ZESTRIL) 10 MG tablet TAKE 1 TABLET BY MOUTH 2 TIMES DAILY. 180 tablet 3  . loperamide (IMODIUM A-D) 2 MG tablet Take 2 mg by mouth 4 (four) times daily as needed for diarrhea or loose stools.    . Multiple Vitamins-Minerals (MULTIVITAMIN WITH MINERALS) tablet Take 1 tablet by mouth daily.    . mupirocin ointment (BACTROBAN) 2 % Place 1 application into the nose 2 (two) times daily. 22 g 0  . SitaGLIPtin-MetFORMIN HCl  (JANUMET XR) [id number] MG TB24 1 tab pc supper 90 tablet 1  . sodium chloride (OCEAN) 0.65 % SOLN nasal spray Place 1 spray into both nostrils as needed for congestion.    . tadalafil (CIALIS) 20 MG tablet Take 20 mg by mouth daily as needed for erectile dysfunction.     No facility-administered medications prior to visit.     Allergies  Allergen Reactions  . Contrast Media [Iodinated Diagnostic Agents] Swelling    Can take if premedicated with diphenhydramine  . Iodine Swelling    Review of Systems  Constitutional: Positive for malaise/fatigue. Negative for fever.  HENT: Negative for congestion.   Eyes: Negative for blurred vision.  Respiratory: Negative for shortness of breath.   Cardiovascular: Negative for chest pain, palpitations and leg swelling.  Gastrointestinal: Negative for abdominal pain, blood in stool and nausea.  Genitourinary: Negative for dysuria and frequency.  Musculoskeletal: Negative for falls.  Skin: Negative for rash.  Neurological: Negative for dizziness, loss of consciousness and headaches.  Endo/Heme/Allergies: Negative for environmental allergies.  Psychiatric/Behavioral: Negative for depression. The patient is not nervous/anxious.        Objective:    Physical Exam  Constitutional: He is oriented to person, place, and time. He appears well-developed and well-nourished. No distress.  HENT:  Head: Normocephalic and atraumatic.  Nose: Nose normal.  Eyes: Right eye exhibits no discharge. Left eye exhibits no discharge.  Neck: Normal range of motion. Neck supple.  Cardiovascular: Normal rate and regular rhythm.   Pulmonary/Chest: Effort normal and breath sounds normal.  Abdominal: Soft. Bowel sounds are normal. There is no tenderness.  Musculoskeletal: He exhibits no edema.  Neurological: He is alert and oriented to person, place, and time.  Skin: Skin is warm and dry.  Psychiatric: He has a normal mood and affect.  Nursing note and vitals  reviewed.   BP 118/72 (BP Location: Left Arm, Patient Position: Sitting, Cuff Size: Large)   Pulse 95   Temp 98.4 F (36.9 C) (Oral)   Ht 6' (1.829 m)   Wt 195 lb (88.5 kg)   SpO2 97%   BMI 26.45 kg/m  Wt Readings from Last 3 Encounters:  06/13/16 195 lb (88.5 kg)  06/13/16 196 lb (88.9 kg)  05/31/16 198 lb 9.6 oz (90.1 kg)     Lab Results  Component Value Date   WBC 7.2 11/24/2015   HGB 14.4 11/24/2015   HCT 42.9 11/24/2015   PLT 233.0 11/24/2015   GLUCOSE 152 (H) 05/26/2016   CHOL 100 11/24/2015   TRIG 120.0 11/24/2015   HDL 33.40 (L) 11/24/2015  LDLDIRECT 109.0 10/26/2014   LDLCALC 42 11/24/2015   ALT 17 11/24/2015   AST 17 11/24/2015   NA 138 05/26/2016   K 4.7 05/26/2016   CL 103 05/26/2016   CREATININE 1.07 05/26/2016   BUN 17 05/26/2016   CO2 26 05/26/2016   TSH 2.04 11/24/2015   PSA 0.64 09/12/2013   INR 1.10 08/16/2014   HGBA1C 8.2 (H) 03/31/2016   MICROALBUR <0.7 03/31/2016    Lab Results  Component Value Date   TSH 2.04 11/24/2015   Lab Results  Component Value Date   WBC 7.2 11/24/2015   HGB 14.4 11/24/2015   HCT 42.9 11/24/2015   MCV 89.3 11/24/2015   PLT 233.0 11/24/2015   Lab Results  Component Value Date   NA 138 05/26/2016   K 4.7 05/26/2016   CO2 26 05/26/2016   GLUCOSE 152 (H) 05/26/2016   BUN 17 05/26/2016   CREATININE 1.07 05/26/2016   BILITOT 0.6 11/24/2015   ALKPHOS 53 11/24/2015   AST 17 11/24/2015   ALT 17 11/24/2015   PROT 7.7 11/24/2015   ALBUMIN 4.4 11/24/2015   CALCIUM 9.9 05/26/2016   ANIONGAP 11 01/31/2015   GFR 75.43 05/26/2016   Lab Results  Component Value Date   CHOL 100 11/24/2015   Lab Results  Component Value Date   HDL 33.40 (L) 11/24/2015   Lab Results  Component Value Date   LDLCALC 42 11/24/2015   Lab Results  Component Value Date   TRIG 120.0 11/24/2015   Lab Results  Component Value Date   CHOLHDL 3 11/24/2015   Lab Results  Component Value Date   HGBA1C 8.2 (H) 03/31/2016        Assessment & Plan:   Problem List Items Addressed This Visit    DM (diabetes mellitus), type 2 with neurological complications [facility])    Following with endocrinology and doing much better. minimize simple carbs. Increase exercise as tolerated. Continue current meds      HTN (hypertension)    Well controlled, no changes to meds. Encouraged heart healthy diet such as the DASH diet and exercise as tolerated.       Relevant Medications   vardenafil (LEVITRA) 10 MG tablet   Other Relevant Orders   Comp Met (CMET)   CBC   TSH   Hyperlipidemia    Tolerating statin, encouraged heart healthy diet, avoid trans fats, minimize simple carbs and saturated fats. Increase exercise as tolerated      Relevant Medications   vardenafil (LEVITRA) 10 MG tablet   Other Relevant Orders   Lipid panel   Preventative health care   Relevant Orders   Hepatitis C Antibody   HIV antibody (with reflex)   ED (erectile dysfunction) of organic origin    Cialis and Viagra not helpful. Will try Levitra and referred to Urology for further consideration       Other Visit Diagnoses    Erectile dysfunction, unspecified erectile dysfunction type    -  Primary   Relevant Medications   vardenafil (LEVITRA) 10 MG tablet   Other Relevant Orders   Ambulatory referral to Urology      I have discontinued Mr. Vieau tadalafil. I am also having him start on vardenafil. Additionally, I am having him maintain his loperamide, aspirin EC, ONE TOUCH ULTRA SYSTEM KIT, multivitamin with minerals, ibuprofen, fluticasone, mupirocin ointment, glucose blood, B-D UF III MINI PEN NEEDLES, SitaGLIPtin-MetFORMIN HCl, atorvastatin, lisinopril, Insulin Glargine-Lixisenatide, INVOKANA, and sodium chloride.  Meds ordered this encounter  Medications  . vardenafil (LEVITRA) 10 MG tablet    Sig: Take 0.5-2 tablets (5-20 mg total) by mouth daily as needed for erectile dysfunction.    Dispense:  10 tablet    Refill:  2      [name], MD

## 2016-06-18 NOTE — Assessment & Plan Note (Signed)
Following with endocrinology and doing much better. minimize simple carbs. Increase exercise as tolerated. Continue current meds

## 2016-06-18 NOTE — Assessment & Plan Note (Signed)
Cialis and Viagra not helpful. Will try Levitra and referred to Urology for further consideration

## 2016-06-18 NOTE — Assessment & Plan Note (Signed)
Well controlled, no changes to meds. Encouraged heart healthy diet such as the DASH diet and exercise as tolerated.  °

## 2016-06-18 NOTE — Assessment & Plan Note (Signed)
Tolerating statin, encouraged heart healthy diet, avoid trans fats, minimize simple carbs and saturated fats. Increase exercise as tolerated 

## 2016-06-19 MED FILL — ATORVASTATIN 40 MG TABLET: 40 | 90 days supply | Qty: 90 | Fill #1

## 2016-06-19 MED FILL — LISINOPRIL 10 MG TABLET: 10 | 90 days supply | Qty: 180 | Fill #1

## 2016-06-22 ENCOUNTER — Encounter: Payer: Self-pay | Admitting: Family Medicine

## 2016-06-22 ENCOUNTER — Other Ambulatory Visit (INDEPENDENT_AMBULATORY_CARE_PROVIDER_SITE_OTHER): Payer: 59

## 2016-06-22 DIAGNOSIS — I10 Essential (primary) hypertension: Secondary | ICD-10-CM

## 2016-06-22 DIAGNOSIS — E782 Mixed hyperlipidemia: Secondary | ICD-10-CM

## 2016-06-22 DIAGNOSIS — Z0000 Encounter for general adult medical examination without abnormal findings: Secondary | ICD-10-CM

## 2016-06-22 LAB — LIPID PANEL
CHOL/HDL RATIO: 4
Cholesterol: 138 mg/dL (ref 0–200)
HDL: 37.5 mg/dL — ABNORMAL LOW (ref >39.00)
LDL Cholesterol: 66 mg/dL (ref 0–99)
NONHDL: 100.61
Triglycerides: 173 mg/dL — ABNORMAL HIGH (ref 0.0–149.0)
VLDL: 34.6 mg/dL (ref 0.0–40.0)

## 2016-06-22 LAB — COMPREHENSIVE METABOLIC PANEL
ALT: 25 U/L (ref 0–53)
AST: 19 U/L (ref 0–37)
Albumin: 4.3 g/dL (ref 3.5–5.2)
Alkaline Phosphatase: 66 U/L (ref 39–117)
BILIRUBIN TOTAL: 0.7 mg/dL (ref 0.2–1.2)
BUN: 19 mg/dL (ref 6–23)
CHLORIDE: 100 meq/L (ref 96–112)
CO2: 27 meq/L (ref 19–32)
CREATININE: 0.9 mg/dL (ref 0.40–1.50)
Calcium: 9.5 mg/dL (ref 8.4–10.5)
GFR: 92.08 mL/min (ref >60.00)
GLUCOSE: 150 mg/dL — AB (ref 70–99)
Potassium: 4.7 mEq/L (ref 3.5–5.1)
SODIUM: 134 meq/L — AB (ref 135–145)
Total Protein: 7.7 g/dL (ref 6.0–8.3)

## 2016-06-22 LAB — CBC
HEMATOCRIT: 45 % (ref 39.0–52.0)
Hemoglobin: 15.3 g/dL (ref 13.0–17.0)
MCHC: 33.9 g/dL (ref 30.0–36.0)
MCV: 89.8 fl (ref 78.0–100.0)
Platelets: 224 10*3/uL (ref 150.0–400.0)
RBC: 5.01 Mil/uL (ref 4.22–5.81)
RDW: 14.2 % (ref 11.5–15.5)
WBC: 7.4 10*3/uL (ref 4.0–10.5)

## 2016-06-22 LAB — TSH: TSH: 1.96 u[IU]/mL (ref 0.35–4.50)

## 2016-06-23 LAB — HEPATITIS C ANTIBODY: HCV AB: NEGATIVE

## 2016-06-23 LAB — HIV ANTIBODY (ROUTINE TESTING W REFLEX): HIV 1&2 Ab, 4th Generation: NONREACTIVE

## 2016-06-27 MED FILL — INVOKANA 100 MG TABLET: 100 | 90 days supply | Qty: 90 | Fill #1

## 2016-06-29 ENCOUNTER — Encounter (HOSPITAL_COMMUNITY): Payer: Self-pay | Admitting: Emergency Medicine

## 2016-06-29 ENCOUNTER — Emergency Department (HOSPITAL_COMMUNITY)
Admission: EM | Admit: 2016-06-29 | Discharge: 2016-06-29 | Disposition: A | Discharge status: Home / Self Care | Payer: 59 | Attending: Emergency Medicine | Admitting: Emergency Medicine

## 2016-06-29 ENCOUNTER — Other Ambulatory Visit: Payer: Self-pay | Admitting: Endocrinology

## 2016-06-29 DIAGNOSIS — R42 Dizziness and giddiness: Secondary | ICD-10-CM | POA: Diagnosis not present

## 2016-06-29 LAB — CBG MONITORING, ED: GLUCOSE-CAPILLARY: 163 mg/dL — AB (ref 65–99)

## 2016-06-29 MED ORDER — MECLIZINE HCL 25 MG PO TABS: 25.0000 mg | ORAL_TABLET | Freq: Three times a day (TID) | ORAL | 0 refills | Status: DC | PRN

## 2016-06-29 MED FILL — JANUMET XR 100-1,000 MG TAB: 100-1000 | 90 days supply | Qty: 90 | Fill #0

## 2016-06-29 MED FILL — MECLIZINE 25 MG TABLET: 25 | 10 days supply | Qty: 30 | Fill #0

## 2016-06-29 NOTE — ED Notes (Signed)
Pt reported CBG of 220 before being checked in.

## 2016-06-29 NOTE — ED Provider Notes (Signed)
MSE was initiated and I personally evaluated the patient and placed orders (if any) at  3:57 PM on June 29, 2016.  Pt here for screening after having symptoms at work and being encouraged by Librarian, academic. The patient appears to have typical right sided peripheral vertigo symptoms. Positive right head impulse. Will treat empirically with meclizine. PCP f/u.    Leo Grosser, MD 06/29/16 [phone]

## 2016-06-29 NOTE — ED Triage Notes (Signed)
Pt sts dizziness x 2 hours and increased CBG from norm; no stroke sx noted

## 2016-07-04 ENCOUNTER — Other Ambulatory Visit: Payer: Self-pay | Admitting: Family Medicine

## 2016-07-04 ENCOUNTER — Encounter: Payer: Self-pay | Admitting: Family Medicine

## 2016-07-04 ENCOUNTER — Encounter: Payer: 59 | Attending: Family Medicine | Admitting: Skilled Nursing Facility1

## 2016-07-04 DIAGNOSIS — Z713 Dietary counseling and surveillance: Secondary | ICD-10-CM | POA: Diagnosis not present

## 2016-07-04 DIAGNOSIS — E119 Type 2 diabetes mellitus without complications: Secondary | ICD-10-CM

## 2016-07-04 DIAGNOSIS — N529 Male erectile dysfunction, unspecified: Secondary | ICD-10-CM

## 2016-07-05 ENCOUNTER — Encounter: Payer: Self-pay | Admitting: Skilled Nursing Facility1

## 2016-07-05 NOTE — Progress Notes (Signed)
Patient was seen on 07/04/2016 for the first of a series of three diabetes self-management courses at [facility].  Patient Education Plan per assessed needs and concerns is to attend four course education program for Diabetes Self Management Education. Pt was an absolute joy to have in class and was very knowledgeable of his disease.  The following learning objectives were met by the patient during this class:  Describe diabetes  State some common risk factors for diabetes  Defines the role of glucose and insulin  Identifies type of diabetes and pathophysiology  Describe the relationship between diabetes and cardiovascular risk  State the members of the Healthcare Team  States the rationale for glucose monitoring  State when to test glucose  State their individual Target Range  State the importance of logging glucose readings  Describe how to interpret glucose readings  Identifies A1C target  Explain the correlation between A1c and eAG values  State symptoms and treatment of high blood glucose  State symptoms and treatment of low blood glucose  Explain proper technique for glucose testing  Identifies proper sharps disposal  Handouts given during class include:  Living Well with Diabetes book  Carb Counting and Meal Planning book  Meal Plan Card  Carbohydrate guide  Meal planning worksheet  Low Sodium Flavoring Tips  The diabetes portion plate  D5K to eAG Conversion Chart  Diabetes Medications  Diabetes Recommended Care Schedule  Support Group  Diabetes Success Plan  Core Class Satisfaction Survey  Follow-Up Plan:  Attend core 2

## 2016-07-11 ENCOUNTER — Ambulatory Visit: Payer: Self-pay

## 2016-07-11 ENCOUNTER — Encounter: Payer: 59 | Admitting: Dietician

## 2016-07-11 DIAGNOSIS — E1149 Type 2 diabetes mellitus with other diabetic neurological complication: Secondary | ICD-10-CM

## 2016-07-11 DIAGNOSIS — Z713 Dietary counseling and surveillance: Secondary | ICD-10-CM | POA: Diagnosis not present

## 2016-07-11 NOTE — Progress Notes (Signed)

## 2016-07-14 ENCOUNTER — Encounter: Payer: Self-pay | Admitting: Family Medicine

## 2016-07-18 ENCOUNTER — Encounter: Payer: 59 | Attending: Family Medicine | Admitting: Skilled Nursing Facility1

## 2016-07-18 ENCOUNTER — Encounter: Payer: Self-pay | Admitting: Family Medicine

## 2016-07-18 ENCOUNTER — Encounter: Payer: Self-pay | Admitting: Skilled Nursing Facility1

## 2016-07-18 DIAGNOSIS — E119 Type 2 diabetes mellitus without complications: Secondary | ICD-10-CM

## 2016-07-18 DIAGNOSIS — Z713 Dietary counseling and surveillance: Secondary | ICD-10-CM | POA: Diagnosis not present

## 2016-07-18 NOTE — Progress Notes (Signed)
Patient was seen on 07/18/2016 for the third of a series of three diabetes self-management courses at the [facility]. The following learning objectives were met by the patient during this class:  . State the amount of activity recommended for healthy living . Describe activities suitable for individual needs . Identify ways to regularly incorporate activity into daily life . Identify barriers to activity and ways to over come these barriers  Identify diabetes medications being personally used and their primary action for lowering glucose and possible side effects . Describe role of stress on blood glucose and develop strategies to address psychosocial issues . Identify diabetes complications and ways to prevent them  Explain how to manage diabetes during illness . Evaluate success in meeting personal goal . Establish 2-3 goals that they will plan to diligently work on until they return for the  74-monthfollow-up visit  Goals:   I will count my carb choices at most meals and snacks  I will be active 30 minutes or more 3 times a week  I will look at patterns in my record book at least 4 days a month  Your patient has identified these potential barriers to change:  Finances  Your patient has identified their diabetes self-care support plan as  Family Support Plan:  Attend Monthly Diabetes Support Group as needed or make a future follow up appointment

## 2016-07-20 ENCOUNTER — Encounter: Payer: Self-pay | Admitting: Family Medicine

## 2016-07-20 ENCOUNTER — Ambulatory Visit (INDEPENDENT_AMBULATORY_CARE_PROVIDER_SITE_OTHER): Payer: 59 | Admitting: Family Medicine

## 2016-07-20 ENCOUNTER — Other Ambulatory Visit: Payer: Self-pay | Admitting: Family Medicine

## 2016-07-20 VITALS — BP 128/70 | HR 88 | Temp 97.7°F | Wt 200.2 lb

## 2016-07-20 DIAGNOSIS — E1149 Type 2 diabetes mellitus with other diabetic neurological complication: Secondary | ICD-10-CM

## 2016-07-20 DIAGNOSIS — M25572 Pain in left ankle and joints of left foot: Secondary | ICD-10-CM

## 2016-07-20 DIAGNOSIS — G8929 Other chronic pain: Secondary | ICD-10-CM

## 2016-07-20 DIAGNOSIS — M25521 Pain in right elbow: Secondary | ICD-10-CM

## 2016-07-20 DIAGNOSIS — I10 Essential (primary) hypertension: Secondary | ICD-10-CM | POA: Diagnosis not present

## 2016-07-20 DIAGNOSIS — E782 Mixed hyperlipidemia: Secondary | ICD-10-CM | POA: Diagnosis not present

## 2016-07-20 LAB — URIC ACID: Uric Acid, Serum: 4.5 mg/dL (ref 4.0–7.8)

## 2016-07-20 MED ORDER — MECLIZINE HCL 25 MG PO TABS: 25.0000 mg | ORAL_TABLET | Freq: Three times a day (TID) | ORAL | 1 refills | Status: DC | PRN

## 2016-07-20 MED ORDER — MELOXICAM 15 MG PO TABS: 15.0000 mg | ORAL_TABLET | Freq: Every day | ORAL | 3 refills | Status: DC | PRN

## 2016-07-20 MED FILL — MELOXICAM 15 MG TABLET: 15 | 30 days supply | Qty: 30 | Fill #0

## 2016-07-20 MED FILL — MECLIZINE 25 MG TABLET: 25 | 13 days supply | Qty: 40 | Fill #0

## 2016-07-20 NOTE — Progress Notes (Signed)
Pre visit review using our clinic review tool, if applicable. No additional management support is needed unless otherwise documented below in the visit note. 

## 2016-07-20 NOTE — Patient Instructions (Addendum)
Lidocaine gel or roll on for the elbow and finger  Consider Zinc such as Coldeeze, aged or black garlic, Vitamin C [id number] to 1000 mg, probiotics, elder berry liquid Tennis Elbow Tennis elbow is puffiness (inflammation) of the outer tendons of your forearm close to your elbow. Your tendons attach your muscles to your bones. Tennis elbow can happen in any sport or job in which you use your elbow too much. It is caused by doing the same motion over and over. Tennis elbow can cause:  Pain and tenderness in your forearm and the outer part of your elbow.  A burning feeling. This runs from your elbow through your arm.  Weak grip in your hands. Follow these instructions at home: Activity  Rest your elbow and wrist as told by your doctor. Try to avoid any activities that caused the problem until your doctor says that you can do them again.  If a physical therapist teaches you exercises, do all of them as told.  If you lift an object, lift it with your palm facing up. This is easier on your elbow. Lifestyle  If your tennis elbow is caused by sports, check your equipment and make sure that:  You are using it correctly.  It fits you well.  If your tennis elbow is caused by work, take breaks often, if you are able. Talk with your manager about doing your work in a way that is safe for you.  If your tennis elbow is caused by computer use, talk with your manager about any changes that can be made to your work setup. General instructions  If told, apply ice to the painful area:  Put ice in a plastic bag.  Place a towel between your skin and the bag.  Leave the ice on for 20 minutes, 2-3 times per day.  Take medicines only as told by your doctor.  If you were given a brace, wear it as told by your doctor.  Keep all follow-up visits as told by your doctor. This is important. Contact a doctor if:  Your pain does not get better with treatment.  Your pain gets worse.  You have weakness in  your forearm, hand, or fingers.  You cannot feel your forearm, hand, or fingers. This information is not intended to replace advice given to you by your health care provider. Make sure you discuss any questions you have with your health care provider. Document Released: 01/18/2010 Document Revised: 03/30/2016 Document Reviewed: 07/27/2014 Elsevier Interactive Patient Education  2017 [employer].

## 2016-07-21 ENCOUNTER — Ambulatory Visit: Payer: Self-pay | Admitting: Family Medicine

## 2016-07-21 LAB — RHEUMATOID FACTOR: Rhuematoid fact SerPl-aCnc: <14 IU/mL (ref <14)

## 2016-07-24 ENCOUNTER — Ambulatory Visit (INDEPENDENT_AMBULATORY_CARE_PROVIDER_SITE_OTHER): Payer: 59 | Admitting: Family Medicine

## 2016-07-24 ENCOUNTER — Encounter: Payer: Self-pay | Admitting: Family Medicine

## 2016-07-24 VITALS — BP 137/93 | HR 92 | Ht 72.0 in | Wt 200.0 lb

## 2016-07-24 DIAGNOSIS — M778 Other enthesopathies, not elsewhere classified: Secondary | ICD-10-CM

## 2016-07-24 DIAGNOSIS — M779 Enthesopathy, unspecified: Principal | ICD-10-CM

## 2016-07-24 MED ORDER — METHYLPREDNISOLONE ACETATE 40 MG/ML IJ SUSP
20.0000 mg | Freq: Once | INTRAMUSCULAR | Status: AC
  Administered 2016-07-24: 20 mg via INTRA_ARTICULAR

## 2016-07-24 NOTE — Patient Instructions (Signed)
You have triceps tendinitis. Try to avoid painful activities as much as possible. Ice the area 3-4 times a day for 15 minutes at a time. Tylenol (500mg  1-2 tabs three times a day) or aleve (up to 2 tabs twice a day) as needed for pain. Compression sleeve during the day typically helps with pain and support. Tricep kickbacks and overhead presses with light weight - 3 sets of 10 once a day.   Consider physical therapy, nitro patches if not improving.  You have a flexor tendinitis of your left middle finger. You were given an injection for this today. Icing, tylenol or aleve as noted above. Can consider splinting, occupational therapy if not improving as expected. Follow up with me in 1 month.

## 2016-07-31 DIAGNOSIS — M778 Other enthesopathies, not elsewhere classified: Secondary | ICD-10-CM | POA: Insufficient documentation

## 2016-07-31 DIAGNOSIS — M779 Enthesopathy, unspecified: Secondary | ICD-10-CM

## 2016-07-31 NOTE — Progress Notes (Signed)
PCP and consultation requested by: [name], MD  Subjective:   HPI: Patient is a 60 y.o. male here for left finger, right elbow pain.  Patient reports for 2 1/2 months he's had problems with left middle finger. Problems straightening this finger out. Pain level 7/10 and sharp. Worse when not using this then trying to straighten finger. Tried meloxicam and heat. Right elbow also painful. Pain 3/10, posterior, more dull. Worse with pressure. Works as Animal nutritionist. No skin changes, numbness.  Past Medical History:  Diagnosis Date  . Colon polyps    adenomatous  . Diverticulosis 10/26/2013  . DM (diabetes mellitus), type 2 with neurological complications ([place]) 09/23/4707  . 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 History of chicken pox    childhood  . History of kidney stones   . Hyperlipidemia 07/26/2014  . Hypertension   . Insomnia 02/25/2013  . Preventative health care 07/26/2014  . Renal lithiasis 02/21/2015  . Sciatica of right side 12/22/2012  . Seasonal allergies   . Squamous cell carcinoma in situ 2014   forehead, s/p excision by derm    Current Outpatient Prescriptions on File Prior to Visit  Medication Sig Dispense Refill  . aspirin EC 81 MG tablet Take 1 tablet (81 mg total) by mouth daily.    Marland Kitchen atorvastatin (LIPITOR) 40 MG tablet TAKE 1 TABLET BY MOUTH DAILY 90 tablet 1  . B-D UF III MINI PEN NEEDLES 31G X 5 MM MISC USE TO INJECT LANTUS DAILY 100 each 2  . Blood Glucose Monitoring Suppl (ONE TOUCH ULTRA SYSTEM KIT) W/DEVICE KIT Use as directed to check blood sugar.  Diagnosis code E11.40    . glucose blood (BAYER CONTOUR TEST) test strip Test blood sugars as directed. Dx E11.49 100 each 12  . Insulin Glargine-Lixisenatide (SOLIQUA) 100-33 UNT-MCG/ML SOPN Inject 20 Units into the skin daily with breakfast. Start with 15 units daily in the morning, increase by 2 units every week until morning sugar is below 120 5 pen 1  . INVOKANA 100 MG  TABS tablet TAKE 1 TABLET BY MOUTH DAILY BEFORE BREAKFAST 30 tablet 3  . JANUMET XR [phone] MG TB24 TAKE 1 TABLET BY MOUTH AFTER SUPPER 90 tablet 1  . lisinopril (PRINIVIL,ZESTRIL) 10 MG tablet TAKE 1 TABLET BY MOUTH 2 TIMES DAILY. 180 tablet 3  . loperamide (IMODIUM A-D) 2 MG tablet Take 2 mg by mouth 4 (four) times daily as needed for diarrhea or loose stools.    . meclizine (ANTIVERT) 25 MG tablet Take 1 tablet (25 mg total) by mouth 3 (three) times daily as needed for dizziness. 40 tablet 1  . meloxicam (MOBIC) 15 MG tablet Take 1 tablet (15 mg total) by mouth daily as needed for pain. With food 30 tablet 3  . Multiple Vitamins-Minerals (MULTIVITAMIN WITH MINERALS) tablet Take 1 tablet by mouth daily.    . mupirocin ointment (BACTROBAN) 2 % Place 1 application into the nose 2 (two) times daily. 22 g 0  . sodium chloride (OCEAN) 0.65 % SOLN nasal spray Place 1 spray into both nostrils as needed for congestion.    . vardenafil (LEVITRA) 10 MG tablet Take 0.5-2 tablets (5-20 mg total) by mouth daily as needed for erectile dysfunction. 10 tablet 2   No current facility-administered medications on file prior to visit.     Past Surgical History:  Procedure Laterality Date  . ABDOMINAL HERNIA REPAIR    . CHOLECYSTECTOMY    . HERNIA REPAIR    .  LAPAROSCOPIC APPENDECTOMY N/A 08/16/2014   Procedure: APPENDECTOMY LAPAROSCOPIC;  Surgeon: Erroll Luna, MD;  Location: [place];  Service: General;  Laterality: N/A;  . MASS EXCISION  05/22/2012   Procedure: EXCISION MASS;  Surgeon: Joyice Faster. Cornett, MD;  Location: [place];  Service: General;  Laterality: Left;  excision abdominal wall mass  . ROTATOR CUFF REPAIR      Allergies  Allergen Reactions  . Contrast Media [Iodinated Diagnostic Agents] Swelling    Can take if premedicated with diphenhydramine  . Iodine Swelling    Social History   Social History  . Marital status: Married    Spouse name: N/A  . Number of children: 3   . Years of education: N/A   Occupational History  .      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 Smoking status: Never Smoker  . Smokeless tobacco: Never Used  . Alcohol use No  . Drug use: No  . Sexual activity: Not on file   Other Topics Concern  . Not on file   Social History Narrative  . No narrative on file    Family History  Problem Relation Age of Onset  . Lung cancer Mother   . Diabetes Maternal Aunt   . Cancer Paternal Grandmother   . Breast cancer Neg Hx   . Colon cancer Neg Hx   . Prostate cancer Neg Hx   . Heart disease Neg Hx     BP (!) 137/93   Pulse 92   Ht 6' (1.829 m)   Wt 200 lb (90.7 kg)   BMI 27.12 kg/m   Review of Systems: See HPI above.     Objective:  Physical Exam:  Gen: NAD, comfortable in exam room  Left hand: No gross deformity, swelling, bruising.  Difficulty actively fully extending 3rd digit. TTP volar 3rd digit into the palm.  No other tenderness. Collateral ligaments of DIP, PIP, MCP joints intact 3rd digit. Able to resist flexion and extension at these joints with 5/5 strength. Pain on passive extension. No palpable nodule at A1 pulley 3rd digit. NVI distally.  Right elbow: No gross deformity, swelling, bruising. TTP triceps at insertion. No other tenderness about elbow. FROM with mild pain on elbow extension. Strength 5/5 all elbow and wrist motions. Collateral ligaments intact. NVI distally.   Assessment & Plan:  1. Left 3rd digit flexor tendinitis - Given injection today.  Discussed tylenol, aleve, icing.  Consider splinting, occ therapy if not improving as expected.  F/u in 1 month.  After informed written consent patient was seated in chair in exam room.  Area overlying volar 3rd digit prepped with alcohol swab then left 3rd digit extensor tendon sheath injected with 0.5:0.90m marcaine: depomedrol.  Patient tolerated procedure well without immediate complications.  2. Right triceps tendinitis -  Icing, tylenol or aleve.  Compression sleeve.  Reviewed home exercise program as well.  Consider PT, nitro patches if not improving.  F/u in 1 month.

## 2016-07-31 NOTE — Assessment & Plan Note (Signed)
Icing, tylenol or aleve.  Compression sleeve.  Reviewed home exercise program as well.  Consider PT, nitro patches if not improving.  F/u in 1 month.

## 2016-07-31 NOTE — Assessment & Plan Note (Signed)
Left 3rd digit flexor tendinitis - Given injection today.  Discussed tylenol, aleve, icing.  Consider splinting, occ therapy if not improving as expected.  F/u in 1 month.  After informed written consent patient was seated in chair in exam room.  Area overlying volar 3rd digit prepped with alcohol swab then left 3rd digit extensor tendon sheath injected with 0.5:0.78mL marcaine: depomedrol.  Patient tolerated procedure well without immediate complications.

## 2016-08-02 MED FILL — MECLIZINE 25 MG TABLET: 25 | 13 days supply | Qty: 40 | Fill #1

## 2016-08-13 DIAGNOSIS — M25521 Pain in right elbow: Secondary | ICD-10-CM

## 2016-08-13 DIAGNOSIS — G8929 Other chronic pain: Secondary | ICD-10-CM | POA: Insufficient documentation

## 2016-08-13 DIAGNOSIS — M25572 Pain in left ankle and joints of left foot: Secondary | ICD-10-CM | POA: Insufficient documentation

## 2016-08-13 NOTE — Assessment & Plan Note (Signed)
hgba1c acceptable, minimize simple carbs. Increase exercise as tolerated. Continue current meds 

## 2016-08-13 NOTE — Progress Notes (Signed)
[name], male   DOB: [birth year], 58 y.o.   MRN: [id number]   Subjective:    [name], male    DOB: 1955-10-08, 60 y.o.   MRN: [id number]  Chief Complaint  Patient presents with  . Elbow Pain    x2 months.  . Finger Pain    X2 months. Can't bend nor straighten L middle finger.    HPI Patient is in today for evaluation of pain. He is noting trouble with hjs left middle finger has been stiff and difficult to bend for roughly 2 months but denies remembering any injury. He does work as a Presenter, broadcasting. Also notes 2 months worth of right elbow pain with some mild swelling, No recent illness or fevers, denies polyuria or polydipsia. Denies CP/palp/SOB/HA/congestion/fevers/GI or GU c/o. Taking meds as prescribed. Sugars not spiking excessively despite pain.   Past Medical History:  Diagnosis Date  . Colon polyps    adenomatous  . Diverticulosis 10/26/2013  . DM (diabetes mellitus), type 2 with neurological complications ([place]) 3/57/0177  . 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 History of chicken pox    childhood  . History of kidney stones   . Hyperlipidemia 07/26/2014  . Hypertension   . Insomnia 02/25/2013  . Preventative health care 07/26/2014  . Renal lithiasis 02/21/2015  . Sciatica of right side 12/22/2012  . Seasonal allergies   . Squamous cell carcinoma in situ 2014   forehead, s/p excision by derm    Past Surgical History:  Procedure Laterality Date  . ABDOMINAL HERNIA REPAIR    . CHOLECYSTECTOMY    . HERNIA REPAIR    . LAPAROSCOPIC APPENDECTOMY N/A 08/16/2014   Procedure: APPENDECTOMY LAPAROSCOPIC;  Surgeon: Erroll Luna, MD;  Location: [facility];  Service: General;  Laterality: N/A;  . MASS EXCISION  05/22/2012   Procedure: EXCISION MASS;  Surgeon: Joyice Faster. Cornett, MD;  Location: [facility];  Service: General;  Laterality: Left;  excision abdominal wall mass  . ROTATOR CUFF REPAIR      Family History    Problem Relation Age of Onset  . Lung cancer Mother   . Diabetes Maternal Aunt   . Cancer Paternal Grandmother   . Breast cancer Neg Hx   . Colon cancer Neg Hx   . Prostate cancer Neg Hx   . Heart disease Neg Hx     Social History   Social History  . Marital status: Married    Spouse name: N/A  . Number of children: 3  . Years of education: N/A   Occupational History  .      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 Smoking status: Never Smoker  . Smokeless tobacco: Never Used  . Alcohol use No  . Drug use: No  . Sexual activity: Not on file   Other Topics Concern  . Not on file   Social History Narrative  . No narrative on file    Outpatient Medications Prior to Visit  Medication Sig Dispense Refill  . aspirin EC 81 MG tablet Take 1 tablet (81 mg total) by mouth daily.    Marland Kitchen atorvastatin (LIPITOR) 40 MG tablet TAKE 1 TABLET BY MOUTH DAILY 90 tablet 1  . B-D UF III MINI PEN NEEDLES 31G X 5 MM MISC USE TO INJECT LANTUS DAILY 100 each 2  . Blood Glucose Monitoring Suppl (ONE TOUCH ULTRA SYSTEM KIT) W/DEVICE KIT Use as directed to  check blood sugar.  Diagnosis code E11.40    . glucose blood (BAYER CONTOUR TEST) test strip Test blood sugars as directed. Dx E11.49 100 each 12  . Insulin Glargine-Lixisenatide (SOLIQUA) 100-33 UNT-MCG/ML SOPN Inject 20 Units into the skin daily with breakfast. Start with 15 units daily in the morning, increase by 2 units every week until morning sugar is below 120 5 pen 1  . INVOKANA 100 MG TABS tablet TAKE 1 TABLET BY MOUTH DAILY BEFORE BREAKFAST 30 tablet 3  . JANUMET XR [phone] MG TB24 TAKE 1 TABLET BY MOUTH AFTER SUPPER 90 tablet 1  . lisinopril (PRINIVIL,ZESTRIL) 10 MG tablet TAKE 1 TABLET BY MOUTH 2 TIMES DAILY. 180 tablet 3  . loperamide (IMODIUM A-D) 2 MG tablet Take 2 mg by mouth 4 (four) times daily as needed for diarrhea or loose stools.    . meclizine (ANTIVERT) 25 MG tablet Take 1 tablet (25 mg total) by mouth 3  (three) times daily as needed for dizziness. 40 tablet 1  . Multiple Vitamins-Minerals (MULTIVITAMIN WITH MINERALS) tablet Take 1 tablet by mouth daily.    . mupirocin ointment (BACTROBAN) 2 % Place 1 application into the nose 2 (two) times daily. 22 g 0  . sodium chloride (OCEAN) 0.65 % SOLN nasal spray Place 1 spray into both nostrils as needed for congestion.    . vardenafil (LEVITRA) 10 MG tablet Take 0.5-2 tablets (5-20 mg total) by mouth daily as needed for erectile dysfunction. 10 tablet 2  . fluticasone (FLONASE) 50 MCG/ACT nasal spray Place 2 sprays into both nostrils daily. (Patient not taking: Reported on 07/20/2016) 16 g 1  . ibuprofen (ADVIL,MOTRIN) 800 MG tablet Take 1 tablet (800 mg total) by mouth 3 (three) times daily. (Patient not taking: Reported on 07/20/2016) 21 tablet 0   No facility-administered medications prior to visit.     Allergies  Allergen Reactions  . Contrast Media [Iodinated Diagnostic Agents] Swelling    Can take if premedicated with diphenhydramine  . Iodine Swelling    Review of Systems  Constitutional: Positive for malaise/fatigue. Negative for fever.  HENT: Negative for congestion.   Eyes: Negative for blurred vision.  Respiratory: Negative for shortness of breath.   Cardiovascular: Negative for chest pain, palpitations and leg swelling.  Gastrointestinal: Negative for abdominal pain, blood in stool and nausea.  Genitourinary: Negative for dysuria and frequency.  Musculoskeletal: Positive for back pain and joint pain. Negative for falls.  Skin: Negative for rash.  Neurological: Negative for dizziness, loss of consciousness and headaches.  Endo/Heme/Allergies: Negative for environmental allergies.  Psychiatric/Behavioral: Negative for depression. The patient is not nervous/anxious.        Objective:    Physical Exam  Constitutional: He is oriented to person, place, and time. He appears well-developed and well-nourished. No distress.  HENT:    Head: Normocephalic and atraumatic.  Nose: Nose normal.  Eyes: Right eye exhibits no discharge. Left eye exhibits no discharge.  Neck: Normal range of motion. Neck supple.  Cardiovascular: Normal rate and regular rhythm.   No murmur heard. Pulmonary/Chest: Effort normal and breath sounds normal.  Abdominal: Soft. Bowel sounds are normal. There is no tenderness.  Musculoskeletal: He exhibits no edema.  Neurological: He is alert and oriented to person, place, and time.  Skin: Skin is warm and dry.  Psychiatric: He has a normal mood and affect.  Nursing note and vitals reviewed.   BP 128/70 (BP Location: Left Arm, Patient Position: Sitting, Cuff Size: Large)   Pulse  88   Temp 97.7 F (36.5 C) (Oral)   Wt 200 lb 3.2 oz (90.8 kg)   SpO2 95% Comment: RA  BMI 27.15 kg/m  Wt Readings from Last 3 Encounters:  07/24/16 200 lb (90.7 kg)  07/20/16 200 lb 3.2 oz (90.8 kg)  07/05/16 198 lb (89.8 kg)     Lab Results  Component Value Date   WBC 7.4 06/22/2016   HGB 15.3 06/22/2016   HCT 45.0 06/22/2016   PLT 224.0 06/22/2016   GLUCOSE 150 (H) 06/22/2016   CHOL 138 06/22/2016   TRIG 173.0 (H) 06/22/2016   HDL 37.50 (L) 06/22/2016   LDLDIRECT 109.0 10/26/2014   LDLCALC 66 06/22/2016   ALT 25 06/22/2016   AST 19 06/22/2016   NA 134 (L) 06/22/2016   K 4.7 06/22/2016   CL 100 06/22/2016   CREATININE 0.90 06/22/2016   BUN 19 06/22/2016   CO2 27 06/22/2016   TSH 1.96 06/22/2016   PSA 0.64 09/12/2013   INR 1.10 08/16/2014   HGBA1C 8.2 (H) 03/31/2016   MICROALBUR <0.7 03/31/2016    Lab Results  Component Value Date   TSH 1.96 06/22/2016   Lab Results  Component Value Date   WBC 7.4 06/22/2016   HGB 15.3 06/22/2016   HCT 45.0 06/22/2016   MCV 89.8 06/22/2016   PLT 224.0 06/22/2016   Lab Results  Component Value Date   NA 134 (L) 06/22/2016   K 4.7 06/22/2016   CO2 27 06/22/2016   GLUCOSE 150 (H) 06/22/2016   BUN 19 06/22/2016   CREATININE 0.90 06/22/2016   BILITOT  0.7 06/22/2016   ALKPHOS 66 06/22/2016   AST 19 06/22/2016   ALT 25 06/22/2016   PROT 7.7 06/22/2016   ALBUMIN 4.3 06/22/2016   CALCIUM 9.5 06/22/2016   ANIONGAP 11 01/31/2015   GFR 92.08 06/22/2016   Lab Results  Component Value Date   CHOL 138 06/22/2016   Lab Results  Component Value Date   HDL 37.50 (L) 06/22/2016   Lab Results  Component Value Date   LDLCALC 66 06/22/2016   Lab Results  Component Value Date   TRIG 173.0 (H) 06/22/2016   Lab Results  Component Value Date   CHOLHDL 4 06/22/2016   Lab Results  Component Value Date   HGBA1C 8.2 (H) 03/31/2016       Assessment & Plan:   Problem List Items Addressed This Visit    DM (diabetes mellitus), type 2 with neurological complications ([place])    [id number] acceptable, minimize simple carbs. Increase exercise as tolerated. Continue current meds      HTN (hypertension)    Well controlled, no changes to meds. Encouraged heart healthy diet such as the DASH diet and exercise as tolerated.       RESOLVED: Hyperlipidemia   Elbow pain, chronic, right    Encouraged moist heat and gentle stretching as tolerated. May try NSAIDs and prescription meds as directed and report if symptoms worsen or seek immediate care. Try topical treatments prn and referred to Sports med for further treatments      Relevant Medications   meloxicam (MOBIC) 15 MG tablet   Other Relevant Orders   Rheumatoid Factor (Completed)   Uric acid (Completed)   Ambulatory referral to Sports Medicine   Pain of joint of left ankle and foot - Primary    Encouraged to wrap, apply topical treatments and referred to sports med.       Relevant Medications   meloxicam (MOBIC)  15 MG tablet   Other Relevant Orders   Rheumatoid Factor (Completed)   Uric acid (Completed)   Ambulatory referral to Sports Medicine      I have discontinued Mr. Cieslinski ibuprofen and fluticasone. I am also having him start on meloxicam. Additionally, I am having him  maintain his loperamide, aspirin EC, ONE TOUCH ULTRA SYSTEM KIT, multivitamin with minerals, mupirocin ointment, glucose blood, B-D UF III MINI PEN NEEDLES, atorvastatin, lisinopril, Insulin Glargine-Lixisenatide, INVOKANA, sodium chloride, vardenafil, JANUMET XR, and meclizine.  Meds ordered this encounter  Medications  . meloxicam (MOBIC) 15 MG tablet    Sig: Take 1 tablet (15 mg total) by mouth daily as needed for pain. With food    Dispense:  30 tablet    Refill:  3    [name], MD

## 2016-08-13 NOTE — Assessment & Plan Note (Signed)
Well controlled, no changes to meds. Encouraged heart healthy diet such as the DASH diet and exercise as tolerated.  °

## 2016-08-13 NOTE — Assessment & Plan Note (Signed)
Encouraged to wrap, apply topical treatments and referred to sports med.

## 2016-08-13 NOTE — Assessment & Plan Note (Signed)
Encouraged moist heat and gentle stretching as tolerated. May try NSAIDs and prescription meds as directed and report if symptoms worsen or seek immediate care. Try topical treatments prn and referred to Sports med for further treatments

## 2016-08-23 DIAGNOSIS — N5201 Erectile dysfunction due to arterial insufficiency: Secondary | ICD-10-CM | POA: Diagnosis not present

## 2016-08-23 DIAGNOSIS — E119 Type 2 diabetes mellitus without complications: Secondary | ICD-10-CM | POA: Diagnosis not present

## 2016-08-24 ENCOUNTER — Ambulatory Visit (INDEPENDENT_AMBULATORY_CARE_PROVIDER_SITE_OTHER): Payer: 59 | Admitting: Family Medicine

## 2016-08-24 ENCOUNTER — Encounter: Payer: Self-pay | Admitting: Family Medicine

## 2016-08-24 ENCOUNTER — Other Ambulatory Visit: Payer: Self-pay | Admitting: Endocrinology

## 2016-08-24 DIAGNOSIS — M779 Enthesopathy, unspecified: Principal | ICD-10-CM

## 2016-08-24 DIAGNOSIS — M778 Other enthesopathies, not elsewhere classified: Secondary | ICD-10-CM | POA: Diagnosis not present

## 2016-08-24 NOTE — Assessment & Plan Note (Signed)
with lateral epicondylitis - Continue with compression sleeve and home exercise program.  Icing, tylenol or aleve if needed.  Declined PT, nitro patches, injection for epicondylitis for now.  F/u in 2-3 months.

## 2016-08-24 NOTE — Progress Notes (Signed)
PCP and consultation requested by: Albert Edge, MD  Subjective:   HPI: Patient is a 61 y.o. male here for left finger, right elbow pain.  12/11: Patient reports for 2 1/2 months he's had problems with left middle finger. Problems straightening this finger out. Pain level 7/10 and sharp. Worse when not using this then trying to straighten finger. Tried meloxicam and heat. Right elbow also painful. Pain 3/10, posterior, more dull. Worse with pressure. Works as Engineer, materials. No skin changes, numbness.  08/24/16: Patient reports his left middle finger is better. Pain level here 0/10. Gets occasional stiffness that resolves with stretching. His elbow still bothers him at 4/10 level posterior and lateral, dull. Worse with lifting things, moving wrist. Wearing sleeve and doing home exercises. No skin changes, numbness.  Past Medical History:  Diagnosis Date  . Colon polyps    adenomatous  . Diverticulosis 10/26/2013  . DM (diabetes mellitus), type 2 with neurological complications ([facility]) 02/09/2012  . 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 History of chicken pox    childhood  . History of kidney stones   . Hyperlipidemia 07/26/2014  . Hypertension   . Insomnia 02/25/2013  . Preventative health care 07/26/2014  . Renal lithiasis 02/21/2015  . Sciatica of right side 12/22/2012  . Seasonal allergies   . Squamous cell carcinoma in situ 2014   forehead, s/p excision by derm    Current Outpatient Prescriptions on File Prior to Visit  Medication Sig Dispense Refill  . aspirin EC 81 MG tablet Take 1 tablet (81 mg total) by mouth daily.    Marland Kitchen atorvastatin (LIPITOR) 40 MG tablet TAKE 1 TABLET BY MOUTH DAILY 90 tablet 1  . B-D UF III MINI PEN NEEDLES 31G X 5 MM MISC USE TO INJECT LANTUS DAILY 100 each 2  . Blood Glucose Monitoring Suppl (ONE TOUCH ULTRA SYSTEM KIT) W/DEVICE KIT Use as directed to check blood sugar.  Diagnosis code E11.40    . glucose blood  (BAYER CONTOUR TEST) test strip Test blood sugars as directed. Dx E11.49 100 each 12  . Insulin Glargine-Lixisenatide (SOLIQUA) 100-33 UNT-MCG/ML SOPN Inject 20 Units into the skin daily with breakfast. Start with 15 units daily in the morning, increase by 2 units every week until morning sugar is below 120 5 pen 1  . INVOKANA 100 MG TABS tablet TAKE 1 TABLET BY MOUTH DAILY BEFORE BREAKFAST 30 tablet 3  . JANUMET XR [phone] MG TB24 TAKE 1 TABLET BY MOUTH AFTER SUPPER 90 tablet 1  . lisinopril (PRINIVIL,ZESTRIL) 10 MG tablet TAKE 1 TABLET BY MOUTH 2 TIMES DAILY. 180 tablet 3  . loperamide (IMODIUM A-D) 2 MG tablet Take 2 mg by mouth 4 (four) times daily as needed for diarrhea or loose stools.    . meclizine (ANTIVERT) 25 MG tablet Take 1 tablet (25 mg total) by mouth 3 (three) times daily as needed for dizziness. 40 tablet 1  . meloxicam (MOBIC) 15 MG tablet Take 1 tablet (15 mg total) by mouth daily as needed for pain. With food 30 tablet 3  . Multiple Vitamins-Minerals (MULTIVITAMIN WITH MINERALS) tablet Take 1 tablet by mouth daily.    . mupirocin ointment (BACTROBAN) 2 % Place 1 application into the nose 2 (two) times daily. 22 g 0  . sodium chloride (OCEAN) 0.65 % SOLN nasal spray Place 1 spray into both nostrils as needed for congestion.    . vardenafil (LEVITRA) 10 MG tablet Take 0.5-2 tablets (5-20 mg total) by mouth  daily as needed for erectile dysfunction. 10 tablet 2   No current facility-administered medications on file prior to visit.     Past Surgical History:  Procedure Laterality Date  . ABDOMINAL HERNIA REPAIR    . CHOLECYSTECTOMY    . HERNIA REPAIR    . LAPAROSCOPIC APPENDECTOMY N/A 08/16/2014   Procedure: APPENDECTOMY LAPAROSCOPIC;  Surgeon: Erroll Luna, MD;  Location: [facility];  Service: General;  Laterality: N/A;  . MASS EXCISION  05/22/2012   Procedure: EXCISION MASS;  Surgeon: Joyice Faster. Cornett, MD;  Location: [facility];  Service: General;  Laterality:  Left;  excision abdominal wall mass  . ROTATOR CUFF REPAIR      Allergies  Allergen Reactions  . Contrast Media [Iodinated Diagnostic Agents] Swelling    Can take if premedicated with diphenhydramine  . Iodine Swelling    Social History   Social History  . Marital status: Married    Spouse name: N/A  . Number of children: 3  . Years of education: N/A   Occupational History  .      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 Smoking status: Never Smoker  . Smokeless tobacco: Never Used  . Alcohol use No  . Drug use: No  . Sexual activity: Not on file   Other Topics Concern  . Not on file   Social History Narrative  . No narrative on file    Family History  Problem Relation Age of Onset  . Lung cancer Mother   . Diabetes Maternal Aunt   . Cancer Paternal Grandmother   . Breast cancer Neg Hx   . Colon cancer Neg Hx   . Prostate cancer Neg Hx   . Heart disease Neg Hx     BP 119/78   Pulse 96   Ht 6' (1.829 m)   Wt 191 lb 12.8 oz (87 kg)   BMI 26.01 kg/m   Review of Systems: See HPI above.     Objective:  Physical Exam:  Gen: NAD, comfortable in exam room  Left hand: No gross deformity, swelling, bruising.  No malrotation or angulation. No TTP Collateral ligaments of DIP, PIP, MCP joints intact 3rd digit. Able to resist flexion and extension at these joints with 5/5 strength. No longer with pain on passive extension NVI distally.  Right elbow: No gross deformity, swelling, bruising. TTP triceps at insertion, lateral epicondyle. No other tenderness about elbow. FROM with mild pain on elbow extension, wrist extension and 3rd digit extension. Strength 5/5 all elbow and wrist motions. Collateral ligaments intact. NVI distally.   Assessment & Plan:  1. Left 3rd digit flexor tendinitis - improved following injection.  Stretch if needed.    2. Right triceps tendinitis, lateral epicondylitis - Continue with compression sleeve and home  exercise program.  Icing, tylenol or aleve if needed.  Declined PT, nitro patches, injection for epicondylitis for now.  F/u in 2-3 months.

## 2016-08-24 NOTE — Assessment & Plan Note (Signed)
Left 3rd digit flexor tendinitis - improved following injection.  Stretch if needed.

## 2016-08-25 MED FILL — BD PEN NDL MINI 31GX5MM: 31G X 5 MM | 90 days supply | Qty: 100 | Fill #2

## 2016-08-25 MED FILL — SOLIQUA 100 UNIT-33 MCG/ML: 100-33 | 70 days supply | Qty: 15 | Fill #0

## 2016-08-28 ENCOUNTER — Ambulatory Visit: Payer: Self-pay | Admitting: Endocrinology

## 2016-08-28 ENCOUNTER — Other Ambulatory Visit (INDEPENDENT_AMBULATORY_CARE_PROVIDER_SITE_OTHER): Payer: 59

## 2016-08-28 DIAGNOSIS — Z794 Long term (current) use of insulin: Secondary | ICD-10-CM | POA: Diagnosis not present

## 2016-08-28 DIAGNOSIS — E1165 Type 2 diabetes mellitus with hyperglycemia: Secondary | ICD-10-CM | POA: Diagnosis not present

## 2016-08-28 LAB — HEMOGLOBIN A1C: Hgb A1c MFr Bld: 9.1 % — ABNORMAL HIGH (ref 4.6–6.5)

## 2016-08-28 LAB — BASIC METABOLIC PANEL
BUN: 12 mg/dL (ref 6–23)
CHLORIDE: 102 meq/L (ref 96–112)
CO2: 24 meq/L (ref 19–32)
Calcium: 9.8 mg/dL (ref 8.4–10.5)
Creatinine, Ser: 0.87 mg/dL (ref 0.40–1.50)
GFR: 95.69 mL/min (ref >60.00)
GLUCOSE: 165 mg/dL — AB (ref 70–99)
POTASSIUM: 4.3 meq/L (ref 3.5–5.1)
SODIUM: 134 meq/L — AB (ref 135–145)

## 2016-08-28 LAB — MICROALBUMIN / CREATININE URINE RATIO
Creatinine,U: 51.4 mg/dL
Microalb Creat Ratio: 1.4 mg/g (ref 0.0–30.0)

## 2016-08-31 ENCOUNTER — Ambulatory Visit: Payer: Self-pay | Admitting: Endocrinology

## 2016-09-07 NOTE — Progress Notes (Signed)
[name], male   DOB: 11/07/1955, 61 y.o.   MRN: [id number]           Reason for Appointment:  Follow-up for Type 2 Diabetes   History of Present Illness:          Date of diagnosis of type 2 diabetes mellitus:        Background history:   He thinks his blood sugars at the time of diagnosis was around 500 and he was symptomatic with increased thirst and urination He has been taking metformin for several years but also has been tried on Amaryl Since about 2013 he has been on Januvia In early 2016 his blood sugars were significantly higher with A1c 11.9 and he was started on Lantus insulin, initially 10 units Janumet was continued Prior to consultation his A1c had gone up to 9%  Recent history:   INSULIN regimen is:  Soliqua 22 units daily in a.m.  His A1c has progressively increased, now 9.1  In 8/17 because of overall high readings especially postprandially was changed from Lantus to [facility]  Current management, blood sugar patterns and problems identified:  His blood sugars have been significantly high since December; although he thinks his sugars were higher because of steroid injection about 2 weeks ago his blood sugars were higher before that and continue to be high  Again he is checking blood sugars mostly fasting  Not clear why blood sugars are much higher than before as he has not gained any weight  However he thinks that he had been going off his diet at least in December and only in the last 3 weeks she has started cutting back on carbohydrates  He is usually very active both on work days and off days and he thinks he is walking at least 18,000 steps a day  He forgets to check his sugars after supper as he goes to sleep early  Also not checking readings after lunch  Non-insulin hypoglycemic drugs the patient is taking are: Janumet XR 100/1000 at supper, Invokana 100 mg daily       Side effects from medications have been: Diarrhea from high dose  metformin  Compliance with the medical regimen: Fair   Glucose monitoring:  done 1-2 times a day         Glucometer:  Accu-Chek Blood Glucose readings by download  Mean values apply above for all meters except median for One Touch  PRE-MEAL Fasting Lunch Dinner Bedtime Overall  Glucose range: 170-228  142, 147   227    Mean/median: 200     191     Self-care: The diet that the patient has been following is: tries to limit high-fat foods, carbohydrates .     Meal times are:  Breakfast is at 6 AM, Lunch: 12-1 PM Dinner: 6-7 PM   Typical meal intake: Breakfast is usually  oatmeal, usually taking lunch from home to work               Dietician visit, most recent: 11/2015               Exercise:  At Gym Up to 45 min during the day; Cardio and weights. 3/7 days a week  Weight history:   Wt Readings from Last 3 Encounters:  09/08/16 200 lb (90.7 kg)  08/24/16 191 lb 12.8 oz (87 kg)  07/24/16 200 lb (90.7 kg)    Glycemic control:   Lab Results  Component Value Date  HGBA1C 9.1 (H) 08/28/2016   HGBA1C 8.2 (H) 03/31/2016   HGBA1C 7.4 (H) 11/24/2015   Lab Results  Component Value Date   MICROALBUR <0.7 08/28/2016   LDLCALC 66 06/22/2016   CREATININE 0.87 08/28/2016    No visits with results within 1 Week(s) from this visit.  Latest known visit with results is:  Lab on 08/28/2016  Component Date Value Ref Range Status  . Microalb, Ur 08/28/2016 <0.7  0.0 - 1.9 mg/dL Final  . Creatinine,U 08/28/2016 51.4  mg/dL Final  . Microalb Creat Ratio 08/28/2016 1.4  0.0 - 30.0 mg/g Final  . Hgb A1c MFr Bld 08/28/2016 9.1* 4.6 - 6.5 % Final  . Sodium 08/28/2016 134* 135 - 145 mEq/L Final  . Potassium 08/28/2016 4.3  3.5 - 5.1 mEq/L Final  . Chloride 08/28/2016 102  96 - 112 mEq/L Final  . CO2 08/28/2016 24  19 - 32 mEq/L Final  . Glucose, Bld 08/28/2016 165* 70 - 99 mg/dL Final  . BUN 08/28/2016 12  6 - 23 mg/dL Final  . Creatinine, Ser 08/28/2016 0.87  0.40 - 1.50 mg/dL Final  .  Calcium 08/28/2016 9.8  8.4 - 10.5 mg/dL Final  . GFR 08/28/2016 95.69  >60.00 mL/min Final       Allergies as of 09/08/2016      Reactions   Contrast Media [iodinated Diagnostic Agents] Swelling   Can take if premedicated with diphenhydramine   Iodine Swelling      Medication List       Accurate as of 09/08/16  8:11 AM. Always use your most recent med list.          aspirin EC 81 MG tablet Take 1 tablet (81 mg total) by mouth daily.   atorvastatin 40 MG tablet Commonly known as:  LIPITOR TAKE 1 TABLET BY MOUTH DAILY   B-D UF III MINI PEN NEEDLES 31G X 5 MM Misc Generic drug:  Insulin Pen Needle USE TO INJECT LANTUS DAILY   glucose blood test strip Commonly known as:  BAYER CONTOUR TEST Test blood sugars as directed. Dx E11.49   INVOKANA 100 MG Tabs tablet Generic drug:  canagliflozin TAKE 1 TABLET BY MOUTH DAILY BEFORE BREAKFAST   JANUMET XR [phone] MG Tb24 Generic drug:  SitaGLIPtin-MetFORMIN HCl TAKE 1 TABLET BY MOUTH AFTER SUPPER   lisinopril 10 MG tablet Commonly known as:  PRINIVIL,ZESTRIL TAKE 1 TABLET BY MOUTH 2 TIMES DAILY.   loperamide 2 MG tablet Commonly known as:  IMODIUM A-D Take 2 mg by mouth 4 (four) times daily as needed for diarrhea or loose stools.   meclizine 25 MG tablet Commonly known as:  ANTIVERT Take 1 tablet (25 mg total) by mouth 3 (three) times daily as needed for dizziness.   meloxicam 15 MG tablet Commonly known as:  MOBIC Take 1 tablet (15 mg total) by mouth daily as needed for pain. With food   multivitamin with minerals tablet Take 1 tablet by mouth daily.   mupirocin ointment 2 % Commonly known as:  BACTROBAN Place 1 application into the nose 2 (two) times daily.   ONE TOUCH ULTRA SYSTEM KIT w/Device Kit Use as directed to check blood sugar.  Diagnosis code E11.40   sodium chloride 0.65 % Soln nasal spray Commonly known as:  OCEAN Place 1 spray into both nostrils as needed for congestion.   SOLIQUA 100-33  UNT-MCG/ML Sopn Generic drug:  Insulin Glargine-Lixisenatide INJECT 15 UNITS INTO THE SKIN DAILY IN THE MORNING INCREASE BY  2 UNITS EVERY WEEK UP TO 20 UNITS (UNTIL MORNING SUGAR IS BELOW 120)   vardenafil 10 MG tablet Commonly known as:  LEVITRA Take 0.5-2 tablets (5-20 mg total) by mouth daily as needed for erectile dysfunction.       Allergies:  Allergies  Allergen Reactions  . Contrast Media [Iodinated Diagnostic Agents] Swelling    Can take if premedicated with diphenhydramine  . Iodine Swelling    Past Medical History:  Diagnosis Date  . Colon polyps    adenomatous  . Diverticulosis 10/26/2013  . DM (diabetes mellitus), type 2 with neurological complications ([place]) 8/36/6294  . 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 History of chicken pox    childhood  . History of kidney stones   . Hyperlipidemia 07/26/2014  . Hypertension   . Insomnia 02/25/2013  . Preventative health care 07/26/2014  . Renal lithiasis 02/21/2015  . Sciatica of right side 12/22/2012  . Seasonal allergies   . Squamous cell carcinoma in situ 2014   forehead, s/p excision by derm    Past Surgical History:  Procedure Laterality Date  . ABDOMINAL HERNIA REPAIR    . CHOLECYSTECTOMY    . HERNIA REPAIR    . LAPAROSCOPIC APPENDECTOMY N/A 08/16/2014   Procedure: APPENDECTOMY LAPAROSCOPIC;  Surgeon: Erroll Luna, MD;  Location: [place];  Service: General;  Laterality: N/A;  . MASS EXCISION  05/22/2012   Procedure: EXCISION MASS;  Surgeon: Joyice Faster. Cornett, MD;  Location: [place];  Service: General;  Laterality: Left;  excision abdominal wall mass  . ROTATOR CUFF REPAIR      Family History  Problem Relation Age of Onset  . Lung cancer Mother   . Diabetes Maternal Aunt   . Cancer Paternal Grandmother   . Breast cancer Neg Hx   . Colon cancer Neg Hx   . Prostate cancer Neg Hx   . Heart disease Neg Hx     Social History:  reports that he has never smoked. He  has never used smokeless tobacco. He reports that he does not drink alcohol or use drugs.    Review of Systems    Lipid history: On long-term treatment with statin, Followed by PCP Has low HDL   Lab Results  Component Value Date   CHOL 138 06/22/2016   HDL 37.50 (L) 06/22/2016   LDLCALC 66 06/22/2016   LDLDIRECT 109.0 10/26/2014   TRIG 173.0 (H) 06/22/2016   CHOLHDL 4 06/22/2016           Hypertension: Currently taking twice a day of the 10 mg lisinopril; home readings are usually about 765 systolic   Most recent eye exam was 1/17  Most recent foot exam: 2/17    Review of Systems    LABS:  No visits with results within 1 Week(s) from this visit.  Latest known visit with results is:  Lab on 08/28/2016  Component Date Value Ref Range Status  . Microalb, Ur 08/28/2016 <0.7  0.0 - 1.9 mg/dL Final  . Creatinine,U 08/28/2016 51.4  mg/dL Final  . Microalb Creat Ratio 08/28/2016 1.4  0.0 - 30.0 mg/g Final  . Hgb A1c MFr Bld 08/28/2016 9.1* 4.6 - 6.5 % Final  . Sodium 08/28/2016 134* 135 - 145 mEq/L Final  . Potassium 08/28/2016 4.3  3.5 - 5.1 mEq/L Final  . Chloride 08/28/2016 102  96 - 112 mEq/L Final  . CO2 08/28/2016 24  19 - 32 mEq/L Final  . Glucose, Bld 08/28/2016 165*  70 - 99 mg/dL Final  . BUN 08/28/2016 12  6 - 23 mg/dL Final  . Creatinine, Ser 08/28/2016 0.87  0.40 - 1.50 mg/dL Final  . Calcium 08/28/2016 9.8  8.4 - 10.5 mg/dL Final  . GFR 08/28/2016 95.69  >60.00 mL/min Final    Physical Examination:  BP 100/70   Pulse 95   Ht 6' (1.829 m)   Wt 200 lb (90.7 kg)   SpO2 95%   BMI 27.12 kg/m     Repeat standing blood pressure 105/16 No edema  ASSESSMENT:  Diabetes type 2, uncontrolled with normal  BMI See history of present illness for detailed discussion of current diabetes management, blood sugar patterns and problems identified  His A1c is significantly high at 9.1% and has been progressively higher This may indicate further insulin  deficiency  Fasting readings are recently averaging about 200 May also have high readings after supper but has only one reading recently He has tried to improve his diet recently but his fasting readings are still about the same and only slightly better Blood sugars tend to be better midday with his increased activity  HYPERTENSION: His blood pressure is low normal, possibly from [place] and he can reduce lisinopril to once a day Microalbumin normal  PLAN:     He will increase Soliqua up to 26 units and try to take it in the evening for better benefit on postprandial readings after supper and fasting readings  May consider increasing Invokana  Also Consider Combining Invokana and Metformin ER  He Will Try to Continue Watching His Diet  Emphasized the Need to Check More Readings after Meals  Discussed Possibility of adding mealtime insulin  He will follow-up in 6 weeks with fructosamine.  Have given him titration sheet to gradually adjust Soliqua every 3 days by 2 units to get morning sugars below 130  Reduce lisinopril to 1 tablet   There are no Patient Instructions on file for this visit.   Counseling time on subjects discussed above is over 50% of today's 25 minute visit   Honestie Kulik 09/08/2016, 8:11 AM   Note: This office note was prepared with Estate agent. Any transcriptional errors that result from this process are unintentional.

## 2016-09-08 ENCOUNTER — Ambulatory Visit (INDEPENDENT_AMBULATORY_CARE_PROVIDER_SITE_OTHER): Payer: 59 | Admitting: Endocrinology

## 2016-09-08 ENCOUNTER — Encounter: Payer: Self-pay | Admitting: Endocrinology

## 2016-09-08 VITALS — BP 100/70 | HR 95 | Ht 72.0 in | Wt 200.0 lb

## 2016-09-08 DIAGNOSIS — E1165 Type 2 diabetes mellitus with hyperglycemia: Secondary | ICD-10-CM

## 2016-09-08 DIAGNOSIS — I10 Essential (primary) hypertension: Secondary | ICD-10-CM | POA: Diagnosis not present

## 2016-09-08 DIAGNOSIS — Z794 Long term (current) use of insulin: Secondary | ICD-10-CM | POA: Diagnosis not present

## 2016-09-08 NOTE — Patient Instructions (Signed)
Stop pm Lisinopril  Shot in pm, 26 as directed  Check blood sugars on waking up daily  Also check blood sugars about 2 hours after a meal and do this after different meals by rotation  Recommended blood sugar levels on waking up is 90-130 and about 2 hours after meal is 130-160  Please bring your blood sugar monitor to each visit, thank you

## 2016-09-13 ENCOUNTER — Encounter: Payer: Self-pay | Admitting: Family Medicine

## 2016-09-13 ENCOUNTER — Ambulatory Visit (INDEPENDENT_AMBULATORY_CARE_PROVIDER_SITE_OTHER): Payer: 59 | Admitting: Family Medicine

## 2016-09-13 VITALS — BP 106/74 | HR 97 | Temp 97.6°F | Ht 72.0 in | Wt 198.2 lb

## 2016-09-13 DIAGNOSIS — J020 Streptococcal pharyngitis: Secondary | ICD-10-CM | POA: Diagnosis not present

## 2016-09-13 DIAGNOSIS — R52 Pain, unspecified: Secondary | ICD-10-CM

## 2016-09-13 DIAGNOSIS — J029 Acute pharyngitis, unspecified: Secondary | ICD-10-CM

## 2016-09-13 LAB — POC INFLUENZA A&B (BINAX/QUICKVUE)
INFLUENZA A, POC: NEGATIVE
INFLUENZA B, POC: NEGATIVE

## 2016-09-13 LAB — POCT RAPID STREP A (OFFICE): RAPID STREP A SCREEN: POSITIVE — AB

## 2016-09-13 MED ORDER — PENICILLIN V POTASSIUM 500 MG PO TABS: 500.0000 mg | ORAL_TABLET | Freq: Two times a day (BID) | ORAL | 0 refills | Status: DC

## 2016-09-13 MED FILL — PENICILLIN VK 500 MG TABLET: 500 | 10 days supply | Qty: 20 | Fill #0

## 2016-09-13 NOTE — Progress Notes (Signed)
[place] [facility] [address] [place], [place] [address] [phone]  Date:  09/13/2016   Name:  Albert [name]   DOB:  [birth year]   MRN:  [id number]  PCP:  [name], MD    Chief Complaint: Cough (c/o prod cough with dark greenish brown mucus, sore throat, watery eyes, sinus congestion, body aches, chills and diarrhea.  Pt states that sx's started this past Friday. Flu vaccine: 04/2016)   History of Present Illness:  Albert [name] is a 61 y.o. very pleasant male patient who presents with the following:  History of IDDM- managed by Dr. Dwyane Dee. Also history of neuropathy, HTN.  Recent adjustment to his DM regimen in hopes of improving his A1c Illness started on Friday- today is Wednesday Here today with illness- he has noted a ST, cough, stuffy nose, left ear pain.   Diarrhea, chills, he vomited once on Saturday He is not sure if he had any fever, but has felt feverish He works as a Presenter, broadcasting for [employer] so he is exposed to a lot of illness around the hospital His wife is ill as well.     BP Readings from Last 3 Encounters:  09/13/16 106/74  09/08/16 100/70  08/24/16 119/78   He notes that his glucose has been under ok control since he has been ill- running up to about 150  Lab Results  Component Value Date   HGBA1C 9.1 (H) 08/28/2016     Patient Active Problem List   Diagnosis Date Noted  . Elbow pain, chronic, right 08/13/2016  . Pain of joint of left ankle and foot 08/13/2016  . Tendinitis of finger of left hand 07/31/2016  . Tendinitis of right triceps 07/31/2016  . Gastroenteritis 04/02/2016  . 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 Renal lithiasis 02/21/2015  . Chest pain 12/18/2014  . Atypical chest pain 11/01/2014  . Rib pain on right side 08/17/2014  . Bursitis of left shoulder 08/12/2014  . 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 Preventative health  care 07/26/2014  . BPH (benign prostatic hyperplasia) 12/11/2013  . Diverticulosis 10/26/2013  . Acute neck pain 06/04/2013  . Insomnia 02/25/2013  . Sciatica of right side 12/22/2012  . HTN (hypertension) 11/22/2012  . Headache 11/15/2012  . Right bundle branch block 11/15/2012  . Skin lesion of scalp 08/15/2012  . DM (diabetes mellitus), type 2 with neurological complications ([place]) 41/93/7902  . Lumbar radiculopathy 02/09/2012    Past Medical History:  Diagnosis Date  . Colon polyps    adenomatous  . Diverticulosis 10/26/2013  . DM (diabetes mellitus), type 2 with neurological complications ([place]) 11/21/7351  . 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 History of chicken pox    childhood  . History of kidney stones   . Hyperlipidemia 07/26/2014  . Hypertension   . Insomnia 02/25/2013  . Preventative health care 07/26/2014  . Renal lithiasis 02/21/2015  . Sciatica of right side 12/22/2012  . Seasonal allergies   . Squamous cell carcinoma in situ 2014   forehead, s/p excision by derm    Past Surgical History:  Procedure Laterality Date  . ABDOMINAL HERNIA REPAIR    . CHOLECYSTECTOMY    . HERNIA REPAIR    . LAPAROSCOPIC APPENDECTOMY N/A 08/16/2014   Procedure: APPENDECTOMY LAPAROSCOPIC;  Surgeon: Erroll Luna, MD;  Location: [place];  Service: General;  Laterality: N/A;  . MASS EXCISION  05/22/2012   Procedure: EXCISION MASS;  Surgeon: Joyice Faster. Cornett, MD;  Location: [employer] [facility];  Service: General;  Laterality: Left;  excision abdominal wall mass  . ROTATOR CUFF REPAIR      Social History  Substance Use Topics  . Smoking status: Never Smoker  . Smokeless tobacco: Never Used  . Alcohol use No    Family History  Problem Relation Age of Onset  . Lung cancer Mother   . Diabetes Maternal Aunt   . Cancer Paternal Grandmother   . Breast cancer Neg Hx   . Colon cancer Neg Hx   . Prostate cancer Neg Hx   . Heart disease Neg Hx      Allergies  Allergen Reactions  . Contrast Media [Iodinated Diagnostic Agents] Swelling    Can take if premedicated with diphenhydramine  . Iodine Swelling    Medication list has been reviewed and updated.  Current Outpatient Prescriptions on File Prior to Visit  Medication Sig Dispense Refill  . aspirin EC 81 MG tablet Take 1 tablet (81 mg total) by mouth daily.    Marland Kitchen atorvastatin (LIPITOR) 40 MG tablet TAKE 1 TABLET BY MOUTH DAILY 90 tablet 1  . B-D UF III MINI PEN NEEDLES 31G X 5 MM MISC USE TO INJECT LANTUS DAILY 100 each 2  . Blood Glucose Monitoring Suppl (ONE TOUCH ULTRA SYSTEM KIT) W/DEVICE KIT Use as directed to check blood sugar.  Diagnosis code E11.40    . glucose blood (BAYER CONTOUR TEST) test strip Test blood sugars as directed. Dx E11.49 100 each 12  . INVOKANA 100 MG TABS tablet TAKE 1 TABLET BY MOUTH DAILY BEFORE BREAKFAST 30 tablet 3  . JANUMET XR [phone] MG TB24 TAKE 1 TABLET BY MOUTH AFTER SUPPER 90 tablet 1  . lisinopril (PRINIVIL,ZESTRIL) 10 MG tablet TAKE 1 TABLET BY MOUTH 2 TIMES DAILY. 180 tablet 3  . loperamide (IMODIUM A-D) 2 MG tablet Take 2 mg by mouth 4 (four) times daily as needed for diarrhea or loose stools.    . Multiple Vitamins-Minerals (MULTIVITAMIN WITH MINERALS) tablet Take 1 tablet by mouth daily.    . mupirocin ointment (BACTROBAN) 2 % Place 1 application into the nose 2 (two) times daily. 22 g 0  . sodium chloride (OCEAN) 0.65 % SOLN nasal spray Place 1 spray into both nostrils as needed for congestion.    . SOLIQUA 100-33 UNT-MCG/ML SOPN INJECT 15 UNITS INTO THE SKIN DAILY IN THE MORNING INCREASE BY 2 UNITS EVERY WEEK UP TO 20 UNITS (UNTIL MORNING SUGAR IS BELOW 120) (Patient taking differently: INJECT 26 UNITS INTO THE SKIN DAILY AT NIGHT) 15 mL 1   No current facility-administered medications on file prior to visit.     Review of Systems:  As per HPI- otherwise negative.  Left ear is sore, right is ok    Physical  Examination: Vitals:   09/13/16 0908  BP: 106/74  Pulse: 97  Temp: 97.6 F (36.4 C)   Vitals:   09/13/16 0908  Weight: 198 lb 3.2 oz (89.9 kg)  Height: 6' (1.829 m)   Body mass index is 26.88 kg/m. Ideal Body Weight: Weight in (lb) to have BMI = 25: 183.9  .  Assessment and Plan: Strep pharyngitis - Plan: penicillin v potassium (VEETID) 500 MG tablet  Sore throat - Plan: POCT rapid strep A  Body aches - Plan: POC Influenza A&B(BINAX/QUICKVUE)  Here today with illness- positive rapid strep Rapid flu is negative Treat with penicillin,  supportive care otherwise He will let us know if not feeling better soon- Sooner if worse.  His next work shift is Saturday- he will contact me if not able to work that day  Signed Lamar Blinks, MD

## 2016-09-13 NOTE — Patient Instructions (Signed)
Your strep test is positive- we are going to treat you for strep throat with penicillin.  Take this twice a day for 10 days- generally this will get you feeling much better within about 24 hours; however if you are not improving or if you are getting worse please let me know! Otherwise continue to rest, drink plenty of fluids and use ibuprofen/ tylneol as needed for symptoms

## 2016-09-19 ENCOUNTER — Other Ambulatory Visit: Payer: Self-pay | Admitting: Endocrinology

## 2016-09-19 ENCOUNTER — Other Ambulatory Visit: Payer: Self-pay | Admitting: Family Medicine

## 2016-09-20 MED FILL — ATORVASTATIN 40 MG TABLET: 40 | 90 days supply | Qty: 90 | Fill #0 | Status: TO

## 2016-09-20 MED FILL — INVOKANA 100 MG TABLET: 100 | 30 days supply | Qty: 30 | Fill #0 | Status: TO

## 2016-09-25 ENCOUNTER — Other Ambulatory Visit: Payer: Self-pay

## 2016-09-25 MED ORDER — INSULIN GLARGINE-LIXISENATIDE 100-33 UNT-MCG/ML ~~LOC~~ SOPN: 26.0000 [IU] | PEN_INJECTOR | Freq: Every day | SUBCUTANEOUS | 3 refills | Status: DC

## 2016-09-28 ENCOUNTER — Encounter: Payer: Self-pay | Admitting: Endocrinology

## 2016-09-28 MED FILL — JANUMET XR 100-1,000 MG TAB: 100-1000 | 90 days supply | Qty: 90 | Fill #1

## 2016-10-02 DIAGNOSIS — E119 Type 2 diabetes mellitus without complications: Secondary | ICD-10-CM | POA: Diagnosis not present

## 2016-10-02 DIAGNOSIS — N528 Other male erectile dysfunction: Secondary | ICD-10-CM | POA: Diagnosis not present

## 2016-10-03 ENCOUNTER — Telehealth: Payer: Self-pay | Admitting: Endocrinology

## 2016-10-03 ENCOUNTER — Ambulatory Visit (INDEPENDENT_AMBULATORY_CARE_PROVIDER_SITE_OTHER): Payer: 59 | Admitting: Family Medicine

## 2016-10-03 ENCOUNTER — Encounter: Payer: Self-pay | Admitting: Family Medicine

## 2016-10-03 ENCOUNTER — Other Ambulatory Visit: Payer: Self-pay

## 2016-10-03 DIAGNOSIS — E782 Mixed hyperlipidemia: Secondary | ICD-10-CM

## 2016-10-03 DIAGNOSIS — E1149 Type 2 diabetes mellitus with other diabetic neurological complication: Secondary | ICD-10-CM | POA: Diagnosis not present

## 2016-10-03 DIAGNOSIS — I10 Essential (primary) hypertension: Secondary | ICD-10-CM | POA: Diagnosis not present

## 2016-10-03 LAB — CBC
HCT: 43.5 % (ref 39.0–52.0)
Hemoglobin: 14.9 g/dL (ref 13.0–17.0)
MCHC: 34.4 g/dL (ref 30.0–36.0)
MCV: 88.9 fl (ref 78.0–100.0)
Platelets: 235 10*3/uL (ref 150.0–400.0)
RBC: 4.89 Mil/uL (ref 4.22–5.81)
RDW: 13.7 % (ref 11.5–15.5)
WBC: 8 10*3/uL (ref 4.0–10.5)

## 2016-10-03 LAB — LIPID PANEL
CHOL/HDL RATIO: 3
Cholesterol: 105 mg/dL (ref 0–200)
HDL: 31 mg/dL — ABNORMAL LOW (ref >39.00)
LDL Cholesterol: 42 mg/dL (ref 0–99)
NONHDL: 74.16
Triglycerides: 161 mg/dL — ABNORMAL HIGH (ref 0.0–149.0)
VLDL: 32.2 mg/dL (ref 0.0–40.0)

## 2016-10-03 LAB — COMPREHENSIVE METABOLIC PANEL
ALT: 25 U/L (ref 0–53)
AST: 17 U/L (ref 0–37)
Albumin: 4.3 g/dL (ref 3.5–5.2)
Alkaline Phosphatase: 71 U/L (ref 39–117)
BILIRUBIN TOTAL: 0.5 mg/dL (ref 0.2–1.2)
BUN: 14 mg/dL (ref 6–23)
CO2: 22 meq/L (ref 19–32)
Calcium: 9.4 mg/dL (ref 8.4–10.5)
Chloride: 104 mEq/L (ref 96–112)
Creatinine, Ser: 0.87 mg/dL (ref 0.40–1.50)
GFR: 95.66 mL/min (ref >60.00)
GLUCOSE: 223 mg/dL — AB (ref 70–99)
Potassium: 3.9 mEq/L (ref 3.5–5.1)
SODIUM: 135 meq/L (ref 135–145)
Total Protein: 7.6 g/dL (ref 6.0–8.3)

## 2016-10-03 LAB — TSH: TSH: 0.85 u[IU]/mL (ref 0.35–4.50)

## 2016-10-03 MED ORDER — LISINOPRIL 5 MG PO TABS: 5.0000 mg | ORAL_TABLET | Freq: Every day | ORAL | 3 refills | Status: DC

## 2016-10-03 MED FILL — LISINOPRIL 5 MG TABLET: 5 | 90 days supply | Qty: 90 | Fill #0

## 2016-10-03 NOTE — Assessment & Plan Note (Addendum)
Well controlled, no changes to meds. Encouraged heart healthy diet such as the DASH diet and exercise as tolerated. Drop Lisinopril to 5 mg daily due to low blood pressure.

## 2016-10-03 NOTE — Patient Instructions (Signed)
Carbohydrate Counting for Diabetes Mellitus, Adult Carbohydrate counting is a method for keeping track of how many carbohydrates you eat. Eating carbohydrates naturally increases the amount of sugar (glucose) in the blood. Counting how many carbohydrates you eat helps keep your blood glucose within normal limits, which helps you manage your diabetes (diabetes mellitus). It is important to know how many carbohydrates you can safely have in each meal. This is different for every person. A diet and nutrition specialist (registered dietitian) can help you make a meal plan and calculate how many carbohydrates you should have at each meal and snack. Carbohydrates are found in the following foods:  Grains, such as breads and cereals.  Dried beans and soy products.  Starchy vegetables, such as potatoes, peas, and corn.  Fruit and fruit juices.  Milk and yogurt.  Sweets and snack foods, such as cake, cookies, candy, chips, and soft drinks. How do I count carbohydrates? There are two ways to count carbohydrates in food. You can use either of the methods or a combination of both. Reading "Nutrition Facts" on packaged food  The "Nutrition Facts" list is included on the labels of almost all packaged foods and beverages in the [place] It includes:  The serving size.  Information about nutrients in each serving, including the grams (g) of carbohydrate per serving. To use the "Nutrition Facts":  Decide how many servings you will have.  Multiply the number of servings by the number of carbohydrates per serving.  The resulting number is the total amount of carbohydrates that you will be having. Learning standard serving sizes of other foods  When you eat foods containing carbohydrates that are not packaged or do not include "Nutrition Facts" on the label, you need to measure the servings in order to count the amount of carbohydrates:  Measure the foods that you will eat with a food scale or measuring  cup, if needed.  Decide how many standard-size servings you will eat.  Multiply the number of servings by 15. Most carbohydrate-rich foods have about 15 g of carbohydrates per serving.  For example, if you eat 8 oz (170 g) of strawberries, you will have eaten 2 servings and 30 g of carbohydrates (2 servings x 15 g = 30 g).  For foods that have more than one food mixed, such as soups and casseroles, you must count the carbohydrates in each food that is included. The following list contains standard serving sizes of common carbohydrate-rich foods. Each of these servings has about 15 g of carbohydrates:   hamburger bun or  English muffin.   oz (15 mL) syrup.   oz (14 g) jelly.  1 slice of bread.  1 six-inch tortilla.  3 oz (85 g) cooked rice or pasta.  4 oz (113 g) cooked dried beans.  4 oz (113 g) starchy vegetable, such as peas, corn, or potatoes.  4 oz (113 g) hot cereal.  4 oz (113 g) mashed potatoes or  of a large baked potato.  4 oz (113 g) canned or frozen fruit.  4 oz (120 mL) fruit juice.  4-6 crackers.  6 chicken nuggets.  6 oz (170 g) unsweetened dry cereal.  6 oz (170 g) plain fat-free yogurt or yogurt sweetened with artificial sweeteners.  8 oz (240 mL) milk.  8 oz (170 g) fresh fruit or one small piece of fruit.  24 oz (680 g) popped popcorn. Example of carbohydrate counting Sample meal  3 oz (85 g) chicken breast.  6 oz (  170 g) brown rice.  4 oz (113 g) corn.  8 oz (240 mL) milk.  8 oz (170 g) strawberries with sugar-free whipped topping. Carbohydrate calculation 1. Identify the foods that contain carbohydrates:  Rice.  Corn.  Milk.  Strawberries. 2. Calculate how many servings you have of each food:  2 servings rice.  1 serving corn.  1 serving milk.  1 serving strawberries. 3. Multiply each number of servings by 15 g:  2 servings rice x 15 g = 30 g.  1 serving corn x 15 g = 15 g.  1 serving milk x 15 g = 15  g.  1 serving strawberries x 15 g = 15 g. 4. Add together all of the amounts to find the total grams of carbohydrates eaten:  30 g + 15 g + 15 g + 15 g = 75 g of carbohydrates total. This information is not intended to replace advice given to you by your health care provider. Make sure you discuss any questions you have with your health care provider. Document Released: 07/31/2005 Document Revised: 02/18/2016 Document Reviewed: 01/12/2016 Elsevier Interactive Patient Education  2017 [employer]  

## 2016-10-03 NOTE — Progress Notes (Signed)
[name], male   DOB: 1956/07/04, 61 y.o.   MRN: [id number]   Subjective:    [name], male    DOB: Jun 28, 1956, 61 y.o.   MRN: [id number]  Chief Complaint  Patient presents with  . Follow-up  . Hypertension    Hypertension  This is a recurrent problem. The problem has been gradually improving since onset. Pertinent negatives include no blurred vision, chest pain, headaches, malaise/fatigue, palpitations or shortness of breath.    Patient is in today for a 4 month follow up for hypertension and other medical concerns. Patient has no acute concerns. He is following with endocrinology and his sugars are improving. No polyuria and polydipsia. He has joined [employer] as well and is happy with it so far. He is tracking to have a hgba1c of 7.45 on next draw. Denies CP/palp/SOB/HA/congestion/fevers/GI or GU c/o. Taking meds as prescribed. Has dropped his Lisinopril to once daily due to feeling fatigued with low blood pressures around 100/60. Now he feels better. He has not taken any for past 5 days.  Past Medical History:  Diagnosis Date  . Colon polyps    adenomatous  . Diverticulosis 10/26/2013  . DM (diabetes mellitus), type 2 with neurological complications ([place]) 3/79/0240  . 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 History of chicken pox    childhood  . History of kidney stones   . Hyperlipidemia 07/26/2014  . Hypertension   . Insomnia 02/25/2013  . Preventative health care 07/26/2014  . Renal lithiasis 02/21/2015  . Sciatica of right side 12/22/2012  . Seasonal allergies   . Squamous cell carcinoma in situ 2014   forehead, s/p excision by derm    Past Surgical History:  Procedure Laterality Date  . ABDOMINAL HERNIA REPAIR    . CHOLECYSTECTOMY    . HERNIA REPAIR    . LAPAROSCOPIC APPENDECTOMY N/A 08/16/2014   Procedure: APPENDECTOMY LAPAROSCOPIC;  Surgeon: Erroll Luna, MD;  Location: [place];  Service: General;   Laterality: N/A;  . MASS EXCISION  05/22/2012   Procedure: EXCISION MASS;  Surgeon: Joyice Faster. Cornett, MD;  Location: [place];  Service: General;  Laterality: Left;  excision abdominal wall mass  . ROTATOR CUFF REPAIR      Family History  Problem Relation Age of Onset  . Lung cancer Mother   . Diabetes Maternal Aunt   . Cancer Paternal Grandmother   . Breast cancer Neg Hx   . Colon cancer Neg Hx   . Prostate cancer Neg Hx   . Heart disease Neg Hx     Social History   Social History  . Marital status: Married    Spouse name: N/A  . Number of children: 3  . Years of education: N/A   Occupational History  .      Security officer [employer]   Social History Main Topics  . Smoking status: Never Smoker  . Smokeless tobacco: Never Used  . Alcohol use No  . Drug use: No  . Sexual activity: Not on file   Other Topics Concern  . Not on file   Social History Narrative  . No narrative on file    Outpatient Medications Prior to Visit  Medication Sig Dispense Refill  . aspirin EC 81 MG tablet Take 1 tablet (81 mg total) by mouth daily.    Marland Kitchen atorvastatin (LIPITOR) 40 MG tablet TAKE 1 TABLET BY MOUTH DAILY 90 tablet 1  . B-D UF  III MINI PEN NEEDLES 31G X 5 MM MISC USE TO INJECT LANTUS DAILY 100 each 2  . Blood Glucose Monitoring Suppl (ONE TOUCH ULTRA SYSTEM KIT) W/DEVICE KIT Use as directed to check blood sugar.  Diagnosis code E11.40    . glucose blood (BAYER CONTOUR TEST) test strip Test blood sugars as directed. Dx E11.49 100 each 12  . Insulin Glargine-Lixisenatide (SOLIQUA) 100-33 UNT-MCG/ML SOPN Inject 26 Units into the skin at bedtime. 5 pen 3  . INVOKANA 100 MG TABS tablet TAKE 1 TABLET BY MOUTH DAILY BEFORE BREAKFAST 30 tablet 3  . JANUMET XR 478 520 1076 MG TB24 TAKE 1 TABLET BY MOUTH AFTER SUPPER 90 tablet 1  . lisinopril (PRINIVIL,ZESTRIL) 10 MG tablet TAKE 1 TABLET BY MOUTH 2 TIMES DAILY. 180 tablet 3  . loperamide (IMODIUM A-D) 2 MG tablet Take 2 mg by mouth  4 (four) times daily as needed for diarrhea or loose stools.    . Multiple Vitamins-Minerals (MULTIVITAMIN WITH MINERALS) tablet Take 1 tablet by mouth daily.    . mupirocin ointment (BACTROBAN) 2 % Place 1 application into the nose 2 (two) times daily. 22 g 0  . sodium chloride (OCEAN) 0.65 % SOLN nasal spray Place 1 spray into both nostrils as needed for congestion.    . penicillin v potassium (VEETID) 500 MG tablet Take 1 tablet (500 mg total) by mouth 2 (two) times daily. 20 tablet 0   No facility-administered medications prior to visit.     Allergies  Allergen Reactions  . Contrast Media [Iodinated Diagnostic Agents] Swelling    Can take if premedicated with diphenhydramine  . Iodine Swelling    Review of Systems  Constitutional: Negative for fever and malaise/fatigue.  HENT: Negative for congestion.   Eyes: Negative for blurred vision.  Respiratory: Negative for cough and shortness of breath.   Cardiovascular: Negative for chest pain, palpitations and leg swelling.  Gastrointestinal: Negative for vomiting.  Musculoskeletal: Positive for joint pain. Negative for back pain.  Skin: Negative for rash.  Neurological: Negative for loss of consciousness and headaches.       Objective:    Physical Exam  Constitutional: He is oriented to person, place, and time. He appears well-developed and well-nourished. No distress.  HENT:  Head: Normocephalic and atraumatic.  Eyes: Conjunctivae are normal.  Neck: Normal range of motion. No thyromegaly present.  Cardiovascular: Normal rate and regular rhythm.   Pulmonary/Chest: Effort normal and breath sounds normal. He has no wheezes.  Abdominal: Soft. Bowel sounds are normal. There is no tenderness.  Musculoskeletal: Normal range of motion. He exhibits no edema or deformity.  Neurological: He is alert and oriented to person, place, and time.  Skin: Skin is warm and dry. He is not diaphoretic.  Psychiatric: He has a normal mood and  affect.    BP 112/78 (BP Location: Left Arm, Patient Position: Sitting, Cuff Size: Normal)   Pulse 89   Temp 98.2 F (36.8 C) (Oral)   Wt 198 lb 3.2 oz (89.9 kg)   SpO2 97%   BMI 26.88 kg/m  Wt Readings from Last 3 Encounters:  10/03/16 198 lb 3.2 oz (89.9 kg)  09/13/16 198 lb 3.2 oz (89.9 kg)  09/08/16 200 lb (90.7 kg)     Lab Results  Component Value Date   WBC 7.4 06/22/2016   HGB 15.3 06/22/2016   HCT 45.0 06/22/2016   PLT 224.0 06/22/2016   GLUCOSE 165 (H) 08/28/2016   CHOL 138 06/22/2016   TRIG 173.0 (H)  06/22/2016   HDL 37.50 (L) 06/22/2016   LDLDIRECT 109.0 10/26/2014   LDLCALC 66 06/22/2016   ALT 25 06/22/2016   AST 19 06/22/2016   NA 134 (L) 08/28/2016   K 4.3 08/28/2016   CL 102 08/28/2016   CREATININE 0.87 08/28/2016   BUN 12 08/28/2016   CO2 24 08/28/2016   TSH 1.96 06/22/2016   PSA 0.64 09/12/2013   INR 1.10 08/16/2014   HGBA1C 9.1 (H) 08/28/2016   MICROALBUR <0.7 08/28/2016    Lab Results  Component Value Date   TSH 1.96 06/22/2016   Lab Results  Component Value Date   WBC 7.4 06/22/2016   HGB 15.3 06/22/2016   HCT 45.0 06/22/2016   MCV 89.8 06/22/2016   PLT 224.0 06/22/2016   Lab Results  Component Value Date   NA 134 (L) 08/28/2016   K 4.3 08/28/2016   CO2 24 08/28/2016   GLUCOSE 165 (H) 08/28/2016   BUN 12 08/28/2016   CREATININE 0.87 08/28/2016   BILITOT 0.7 06/22/2016   ALKPHOS 66 06/22/2016   AST 19 06/22/2016   ALT 25 06/22/2016   PROT 7.7 06/22/2016   ALBUMIN 4.3 06/22/2016   CALCIUM 9.8 08/28/2016   ANIONGAP 11 01/31/2015   GFR 95.69 08/28/2016   Lab Results  Component Value Date   CHOL 138 06/22/2016   Lab Results  Component Value Date   HDL 37.50 (L) 06/22/2016   Lab Results  Component Value Date   LDLCALC 66 06/22/2016   Lab Results  Component Value Date   TRIG 173.0 (H) 06/22/2016   Lab Results  Component Value Date   CHOLHDL 4 06/22/2016   Lab Results  Component Value Date   HGBA1C 9.1 (H)  08/28/2016       Assessment & Plan:   Problem List Items Addressed This Visit    DM (diabetes mellitus), type 2 with neurological complications ([place])    PBHE1B unacceptable, minimize simple carbs. Increase exercise as tolerated. Continue current meds for now following endocrinology.      HTN (hypertension)    Well controlled, no changes to meds. Encouraged heart healthy diet such as the DASH diet and exercise as tolerated.       Hyperlipidemia, mixed    Tolerating statin, encouraged heart healthy diet, avoid trans fats, minimize simple carbs and saturated fats. Increase exercise as tolerated         I have discontinued Mr. Rog penicillin v potassium. I am also having him maintain his loperamide, aspirin EC, ONE TOUCH ULTRA SYSTEM KIT, multivitamin with minerals, mupirocin ointment, glucose blood, B-D UF III MINI PEN NEEDLES, lisinopril, sodium chloride, JANUMET XR, atorvastatin, INVOKANA, and Insulin Glargine-Lixisenatide.  No orders of the defined types were placed in this encounter.   CMA served as Education administrator during this visit. History, Physical and Plan performed by medical provider. Documentation and orders reviewed and attested to.  [name], MD

## 2016-10-03 NOTE — Assessment & Plan Note (Signed)
hgba1c unacceptable, minimize simple carbs. Increase exercise as tolerated. Continue current meds for now following endocrinology.

## 2016-10-03 NOTE — Telephone Encounter (Signed)
Pt called in asking about the MyChart message and also the pharmacy message that was sent in regarding the [place] medication.  He said he will run out tonight and wants to know what he needs to do regarding the prescription.

## 2016-10-03 NOTE — Progress Notes (Signed)
Pre visit review using our clinic review tool, if applicable. No additional management support is needed unless otherwise documented below in the visit note. 

## 2016-10-03 NOTE — Assessment & Plan Note (Signed)
Tolerating statin, encouraged heart healthy diet, avoid trans fats, minimize simple carbs and saturated fats. Increase exercise as tolerated 

## 2016-10-04 ENCOUNTER — Other Ambulatory Visit: Payer: Self-pay

## 2016-10-04 MED ORDER — SITAGLIP PHOS-METFORMIN HCL ER 100-1000 MG PO TB24: ORAL_TABLET | ORAL | 1 refills | Status: DC

## 2016-10-04 MED ORDER — LIRAGLUTIDE 18 MG/3ML ~~LOC~~ SOPN: PEN_INJECTOR | SUBCUTANEOUS | 3 refills | Status: DC

## 2016-10-04 NOTE — Telephone Encounter (Signed)
Pt came by and brought some information regarding his prescriptions, he said that he is now completely out of the [place], but that due to being on the [facility], that drug is not covered and wants to know if it can be switched to either Lantus Vial and Pens or Victoza Pens (those are covered under [employer]).  He said also for the injectables, he would need the dosage to be higher around 36 units or written for 20 units twice daily.  That is what he has been doing and it has worked in helping bring his sugars down. He also said that the Accu Check Guide test strips are the ones that will be covered under the [facility] and that he is down to about 20 strips and is testing 3x daily, so he will need those called in as well. Janumet is also not on [facility], the Metformin and Januvia are.  Please advise and send prescriptions to the [facility] in [facility].

## 2016-10-04 NOTE — Telephone Encounter (Signed)
Ordered victoza and the janumet but the [facility] will need a PA the system would not allow me to order it- I will initiate PA tomorrow

## 2016-10-04 NOTE — Telephone Encounter (Signed)
Victoza 1.2 mg daily, 2 pens per month, he will have to start with 0.6 mg for the first 3 days He will need to find out if he can get [place] covered because this is better than Lantus and will need 30 units daily of this insulin. Okay to switch to equivalent doses of metformin ER and Januvia to the Janumet he was taking

## 2016-10-05 MED FILL — VICTOZA 2-PAK 18 MG/3 ML PE: 18 | 30 days supply | Qty: 6 | Fill #0

## 2016-10-09 ENCOUNTER — Ambulatory Visit: Payer: Self-pay | Admitting: Family Medicine

## 2016-10-09 DIAGNOSIS — N5201 Erectile dysfunction due to arterial insufficiency: Secondary | ICD-10-CM | POA: Diagnosis not present

## 2016-10-10 ENCOUNTER — Other Ambulatory Visit: Payer: Self-pay

## 2016-10-10 MED ORDER — GLUCOSE BLOOD VI STRP: ORAL_STRIP | 12 refills | Status: DC

## 2016-10-10 MED ORDER — BAYER MICROLET LANCETS MISC: 4 refills | Status: DC

## 2016-10-10 NOTE — Telephone Encounter (Signed)
[facility] needs refills on the pts test strips and lancets

## 2016-10-10 NOTE — Telephone Encounter (Signed)
Ordered 10/10/16

## 2016-10-11 ENCOUNTER — Other Ambulatory Visit: Payer: Self-pay

## 2016-10-11 MED ORDER — GLUCOSE BLOOD VI STRP: ORAL_STRIP | 4 refills | Status: DC

## 2016-10-12 ENCOUNTER — Other Ambulatory Visit: Payer: Self-pay

## 2016-10-12 ENCOUNTER — Telehealth: Payer: Self-pay | Admitting: Endocrinology

## 2016-10-12 NOTE — Telephone Encounter (Signed)
Pharmacy called in and was checking on the status of the request that was sent over.  Please call them.

## 2016-10-16 ENCOUNTER — Other Ambulatory Visit: Payer: Self-pay

## 2016-10-16 MED ORDER — ACCU-CHEK MULTICLIX LANCETS MISC: 12 refills | Status: DC

## 2016-10-17 ENCOUNTER — Other Ambulatory Visit (INDEPENDENT_AMBULATORY_CARE_PROVIDER_SITE_OTHER): Payer: 59

## 2016-10-17 DIAGNOSIS — Z794 Long term (current) use of insulin: Secondary | ICD-10-CM | POA: Diagnosis not present

## 2016-10-17 DIAGNOSIS — E1165 Type 2 diabetes mellitus with hyperglycemia: Secondary | ICD-10-CM | POA: Diagnosis not present

## 2016-10-17 LAB — GLUCOSE, RANDOM: GLUCOSE: 149 mg/dL — AB (ref 70–99)

## 2016-10-17 MED FILL — ACCU-CHEK GUIDE TEST STRIP: 30 days supply | Qty: 50 | Fill #0

## 2016-10-17 MED FILL — ACCU-CHEK FASTCLIX LANCETS: 30 days supply | Qty: 102 | Fill #0

## 2016-10-17 NOTE — Telephone Encounter (Signed)
This has been done.

## 2016-10-18 LAB — FRUCTOSAMINE: FRUCTOSAMINE: 305 umol/L — AB (ref 0–285)

## 2016-10-18 MED FILL — INVOKANA 100 MG TABLET: 100 | 30 days supply | Qty: 30 | Fill #0

## 2016-10-19 ENCOUNTER — Encounter: Payer: Self-pay | Admitting: Family Medicine

## 2016-10-20 ENCOUNTER — Ambulatory Visit: Payer: Self-pay | Admitting: Endocrinology

## 2016-10-20 ENCOUNTER — Encounter: Payer: Self-pay | Admitting: Family Medicine

## 2016-10-26 ENCOUNTER — Ambulatory Visit: Payer: Self-pay | Admitting: Family Medicine

## 2016-11-08 ENCOUNTER — Other Ambulatory Visit: Payer: Self-pay | Admitting: Family Medicine

## 2016-11-08 ENCOUNTER — Encounter: Payer: Self-pay | Admitting: Endocrinology

## 2016-11-08 ENCOUNTER — Ambulatory Visit (INDEPENDENT_AMBULATORY_CARE_PROVIDER_SITE_OTHER): Payer: 59 | Admitting: Endocrinology

## 2016-11-08 ENCOUNTER — Other Ambulatory Visit: Payer: Self-pay

## 2016-11-08 VITALS — BP 100/64 | HR 90 | Ht 72.0 in | Wt 198.0 lb

## 2016-11-08 DIAGNOSIS — Z794 Long term (current) use of insulin: Secondary | ICD-10-CM

## 2016-11-08 DIAGNOSIS — E1165 Type 2 diabetes mellitus with hyperglycemia: Secondary | ICD-10-CM | POA: Diagnosis not present

## 2016-11-08 MED ORDER — LIRAGLUTIDE 18 MG/3ML ~~LOC~~ SOPN: 1.8000 mg | PEN_INJECTOR | Freq: Every day | SUBCUTANEOUS | 3 refills | Status: DC

## 2016-11-08 MED FILL — VICTOZA 18 MG/3 ML INJECT P: 18 | 30 days supply | Qty: 9 | Fill #0

## 2016-11-08 MED FILL — PENTIPS 31G X 5 MM MISC: 31G X 5 MM | 90 days supply | Qty: 100 | Fill #0

## 2016-11-08 NOTE — Progress Notes (Signed)
[name], male   DOB: 1956/04/26, 61 y.o.   MRN: [id number]           Reason for Appointment:  Follow-up for Type 2 Diabetes   History of Present Illness:          Date of diagnosis of type 2 diabetes mellitus:        Background history:   He thinks his blood sugars at the time of diagnosis was around 500 and he was symptomatic with increased thirst and urination He has been taking metformin for several years but also has been tried on [place]aryl Since about 2013 he has been on Januvia In early 2016 his blood sugars were significantly higher with A1c 11.9 and he was started on Lantus insulin, initially 10 units Janumet was continued Prior to consultation his A1c had gone up to 9%  Recent history:   Non-insulin hypoglycemic drugs the patient is taking are: Janumet XR 100/1000 at supper, Invokana 100 mg daily  Victoza 1.2 mg daily  INSULIN regimen is: None  His A1c has progressively increased last year, was last 9.1  Since his insurance did not cover [place] this year he was changed to Victoza, he was also supposed to take Lantus separately but, he did not get the prescription  Current management, blood sugar patterns and problems identified:  His blood sugars have been significantly better with Victoza and he is tolerating this well  Even fasting blood sugars are reasonably well controlled without taking any basal insulin now.  Although his weight is about the same he thinks he is a little more active than usual  Also he believes he is overall trying to be more consistent with diet.  Highest blood sugars are in the morning although not doing many readings after meals especially at night  He is asking about switching his Janumet to a less expensive medication        Side effects from medications have been: Diarrhea from high dose metformin  Compliance with the medical regimen: Fair   Glucose monitoring:  done 1-2 times a day         Glucometer:   Accu-Chek Blood Glucose readings by download  Mean values apply above for all meters except median for One Touch  PRE-MEAL Fasting Lunch Dinner Bedtime Overall  Glucose range: 135-184  117-145   105-166    Mean/median: 143     133    POST-MEAL PC Breakfast PC Lunch PC Dinner  Glucose range:  119, 123    Mean/median:        Self-care: The diet that the patient has been following is: tries to limit high-fat foods, carbohydrates .     Meal times are:  Breakfast is at 6 AM, Lunch: 12-1 PM Dinner: 6-7 PM   Typical meal intake: Breakfast is usually  oatmeal, usually taking lunch from home to work               Dietician visit, most recent: 11/2015               Exercise:  At Gym Up to 45 min during the day; Cardio and weights. 3/7 days a week  Weight history:   Wt Readings from Last 3 Encounters:  11/08/16 198 lb (89.8 kg)  10/03/16 198 lb 3.2 oz (89.9 kg)  09/13/16 198 lb 3.2 oz (89.9 kg)    Glycemic control:   Lab Results  Component Value Date   HGBA1C 9.1 (H) 08/28/2016  HGBA1C 8.2 (H) 03/31/2016   HGBA1C 7.4 (H) 11/24/2015   Lab Results  Component Value Date   MICROALBUR <0.7 08/28/2016   LDLCALC 42 10/03/2016   CREATININE 0.87 10/03/2016    No visits with results within 1 Week(s) from this visit.  Latest known visit with results is:  Lab on 10/17/2016  Component Date Value Ref Range Status  . Glucose, Bld 10/17/2016 149* 70 - 99 mg/dL Final  . Fructosamine 10/17/2016 305* 0 - 285 umol/L Final   Comment: Published reference interval for apparently healthy subjects between age 40 and 23 is 40 - 285 umol/L and in a poorly controlled diabetic population is 228 - 563 umol/L with a mean of 396 umol/L.        Allergies as of 11/08/2016      Reactions   Contrast Media [iodinated Diagnostic Agents] Swelling   Can take if premedicated with diphenhydramine   Iodine Swelling      Medication List       Accurate as of 11/08/16 11:59 PM. Always use your most  recent med list.          accu-chek multiclix lancets Use to test blood sugar once daily Dx code E11.49   aspirin EC 81 MG tablet Take 1 tablet (81 mg total) by mouth daily.   atorvastatin 40 MG tablet Commonly known as:  LIPITOR TAKE 1 TABLET BY MOUTH DAILY   B-D UF III MINI PEN NEEDLES 31G X 5 MM Misc Generic drug:  Insulin Pen Needle USE TO INJECT LANTUS DAILY   glucose blood test strip Commonly known as:  ACCU-CHEK GUIDE Use to test blood sugar once daily Dx code E11.49   INVOKANA 100 MG Tabs tablet Generic drug:  canagliflozin TAKE 1 TABLET BY MOUTH DAILY BEFORE BREAKFAST   liraglutide 18 MG/3ML Sopn Commonly known as:  VICTOZA Inject 0.3 mLs (1.8 mg total) into the skin daily.   lisinopril 5 MG tablet Commonly known as:  PRINIVIL,ZESTRIL Take 1 tablet (5 mg total) by mouth daily.   loperamide 2 MG tablet Commonly known as:  IMODIUM A-D Take 2 mg by mouth 4 (four) times daily as needed for diarrhea or loose stools.   multivitamin with minerals tablet Take 1 tablet by mouth daily.   mupirocin ointment 2 % Commonly known as:  BACTROBAN Place 1 application into the nose 2 (two) times daily.   sodium chloride 0.65 % Soln nasal spray Commonly known as:  OCEAN Place 1 spray into both nostrils as needed for congestion.       Allergies:  Allergies  Allergen Reactions  . Contrast Media [Iodinated Diagnostic Agents] Swelling    Can take if premedicated with diphenhydramine  . Iodine Swelling    Past Medical History:  Diagnosis Date  . Colon polyps    adenomatous  . Diverticulosis 10/26/2013  . DM (diabetes mellitus), type 2 with neurological complications ([employer]) 2/95/6213  . 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 History of chicken pox    childhood  . History of kidney stones   . Hyperlipidemia 07/26/2014  . Hypertension   . Insomnia 02/25/2013  . Preventative health care 07/26/2014  . Renal lithiasis 02/21/2015  . Sciatica of  right side 12/22/2012  . Seasonal allergies   . Squamous cell carcinoma in situ 2014   forehead, s/p excision by derm    Past Surgical History:  Procedure Laterality Date  . ABDOMINAL HERNIA REPAIR    . CHOLECYSTECTOMY    . HERNIA  REPAIR    . LAPAROSCOPIC APPENDECTOMY N/A 08/16/2014   Procedure: APPENDECTOMY LAPAROSCOPIC;  Surgeon: Erroll Luna, MD;  Location: [place];  Service: General;  Laterality: N/A;  . MASS EXCISION  05/22/2012   Procedure: EXCISION MASS;  Surgeon: Joyice Faster. Cornett, MD;  Location: [place];  Service: General;  Laterality: Left;  excision abdominal wall mass  . ROTATOR CUFF REPAIR      Family History  Problem Relation Age of Onset  . Lung cancer Mother   . Diabetes Maternal Aunt   . Cancer Paternal Grandmother   . Breast cancer Neg Hx   . Colon cancer Neg Hx   . Prostate cancer Neg Hx   . Heart disease Neg Hx     Social History:  reports that he has never smoked. He has never used smokeless tobacco. He reports that he does not drink alcohol or use drugs.    Review of Systems    Lipid history: On long-term treatment with statin, Followed by PCP Has low HDL   Lab Results  Component Value Date   CHOL 105 10/03/2016   HDL 31.00 (L) 10/03/2016   LDLCALC 42 10/03/2016   LDLDIRECT 109.0 10/26/2014   TRIG 161.0 (H) 10/03/2016   CHOLHDL 3 10/03/2016           Hypertension: Blood pressure was low on the last visit and he was told to take only 1 tablet of lisinopril a day now   Most recent eye exam was 1/17  Most recent foot exam: 2/17    Review of Systems    LABS:  No visits with results within 1 Week(s) from this visit.  Latest known visit with results is:  Lab on 10/17/2016  Component Date Value Ref Range Status  . Glucose, Bld 10/17/2016 149* 70 - 99 mg/dL Final  . Fructosamine 10/17/2016 305* 0 - 285 umol/L Final   Comment: Published reference interval for apparently healthy subjects between age 53 and 10 is 78 - 285  umol/L and in a poorly controlled diabetic population is 228 - 563 umol/L with a mean of 396 umol/L.     Physical Examination:  BP 100/64   Pulse 90   Ht 6' (1.829 m)   Wt 198 lb (89.8 kg)   SpO2 92%   BMI 26.85 kg/m     ASSESSMENT:  Diabetes type 2, uncontrolled with normal  BMI See history of present illness for detailed discussion of current diabetes management, blood sugar patterns and problems identified  With switching from [place] to Victoza his blood sugars are surprisingly better even without basal insulin Likely also benefiting from [place] He is also trying to be fairly consistent with diet and portion control as well as keeping up with his exercise  Fructosamine is 305, may have relatively high readings fasting  HYPERTENSION: His blood pressure is controlled  PLAN:    He will increase Victoza to 1.8 mg daily to get maximum benefit He will call when he is finishing Janumet and will switch him to metformin ER 750 mg, 2 tablets daily Continue Invokana 100 mg a day consider increasing the dose if needed Monitoring blood sugars at various times A1c on the next visit  Patient Instructions  Check blood sugars on waking up  3-4x per week  Also check blood sugars about 2 hours after a meal and do this after different meals by rotation  Recommended blood sugar levels on waking up is 90-130 and about 2 hours after meal  is 130-160  Please bring your blood sugar monitor to each visit, thank you       [facility] 11/09/2016, 8:45 AM   Note: This office note was prepared with Dragon voice recognition system technology. Any transcriptional errors that result from this process are unintentional.

## 2016-11-08 NOTE — Patient Instructions (Signed)
Check blood sugars on waking up  3-4x per week  Also check blood sugars about 2 hours after a meal and do this after different meals by rotation  Recommended blood sugar levels on waking up is 90-130 and about 2 hours after meal is 130-160  Please bring your blood sugar monitor to each visit, thank you

## 2016-11-13 ENCOUNTER — Ambulatory Visit (INDEPENDENT_AMBULATORY_CARE_PROVIDER_SITE_OTHER): Payer: 59 | Admitting: Family Medicine

## 2016-11-13 ENCOUNTER — Encounter: Payer: Self-pay | Admitting: Family Medicine

## 2016-11-13 DIAGNOSIS — M25521 Pain in right elbow: Secondary | ICD-10-CM

## 2016-11-13 DIAGNOSIS — G8929 Other chronic pain: Secondary | ICD-10-CM | POA: Diagnosis not present

## 2016-11-13 MED ORDER — NITROGLYCERIN 0.2 MG/HR TD PT24: MEDICATED_PATCH | TRANSDERMAL | 1 refills | Status: DC

## 2016-11-13 MED FILL — NITROGLYCERIN 0.2 MG/HR PAT: 0.2 | 88 days supply | Qty: 22 | Fill #0

## 2016-11-13 NOTE — Patient Instructions (Signed)
Continue your home exercises, compression, heat. Add nitro patches - 1/4th patch over affected elbow, change daily. Follow up with me in 6 weeks. Considerations if not improving: increasing to 1/2 patch, physical therapy, injection, MRI.

## 2016-11-14 NOTE — Progress Notes (Signed)
PCP and consultation requested by: Albert Homans, MD  Subjective:   HPI: Patient is a 62 y.o. male here for left finger, right elbow pain.  12/11: Patient reports for 2 1/2 months he's had problems with left middle finger. Problems straightening this finger out. Pain level 7/10 and sharp. Worse when not using this then trying to straighten finger. Tried meloxicam and heat. Right elbow also painful. Pain 3/10, posterior, more dull. Worse with pressure. Works as Animal nutritionist. No skin changes, numbness.  08/24/16: Patient reports his left middle finger is better. Pain level here 0/10. Gets occasional stiffness that resolves with stretching. His elbow still bothers him at 4/10 level posterior and lateral, dull. Worse with lifting things, moving wrist. Wearing sleeve and doing home exercises. No skin changes, numbness.  4/2: Patient reports middle finger is still doing well. Has right elbow pain still laterally and posteriorly. Pain level 5/10, sharp when trying to extend and bend elbow, pick things up. + swelling locally. Doing home exercises, compression, heat. No skin changes, numbness.  Past Medical History:  Diagnosis Date  . Colon polyps    adenomatous  . Diverticulosis 10/26/2013  . DM (diabetes mellitus), type 2 with neurological complications ([place]) 3/79/0240  . 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 History of chicken pox    childhood  . History of kidney stones   . Hyperlipidemia 07/26/2014  . Hypertension   . Insomnia 02/25/2013  . Preventative health care 07/26/2014  . Renal lithiasis 02/21/2015  . Sciatica of right side 12/22/2012  . Seasonal allergies   . Squamous cell carcinoma in situ 2014   forehead, s/p excision by derm    Current Outpatient Prescriptions on File Prior to Visit  Medication Sig Dispense Refill  . aspirin EC 81 MG tablet Take 1 tablet (81 mg total) by mouth daily.    Marland Kitchen atorvastatin (LIPITOR) 40 MG tablet TAKE  1 TABLET BY MOUTH DAILY 90 tablet 1  . B-D UF III MINI PEN NEEDLES 31G X 5 MM MISC USE TO INJECT LANTUS DAILY 100 each 2  . glucose blood (ACCU-CHEK GUIDE) test strip Use to test blood sugar once daily Dx code E11.49 50 each 4  . INVOKANA 100 MG TABS tablet TAKE 1 TABLET BY MOUTH DAILY BEFORE BREAKFAST 30 tablet 3  . Lancets (ACCU-CHEK MULTICLIX) lancets Use to test blood sugar once daily Dx code E11.49 100 each 12  . liraglutide (VICTOZA) 18 MG/3ML SOPN Inject 0.3 mLs (1.8 mg total) into the skin daily. 9 pen 3  . lisinopril (PRINIVIL,ZESTRIL) 5 MG tablet Take 1 tablet (5 mg total) by mouth daily. 90 tablet 3  . loperamide (IMODIUM A-D) 2 MG tablet Take 2 mg by mouth 4 (four) times daily as needed for diarrhea or loose stools.    . Multiple Vitamins-Minerals (MULTIVITAMIN WITH MINERALS) tablet Take 1 tablet by mouth daily.    . mupirocin ointment (BACTROBAN) 2 % Place 1 application into the nose 2 (two) times daily. 22 g 0  . sodium chloride (OCEAN) 0.65 % SOLN nasal spray Place 1 spray into both nostrils as needed for congestion.    . [DISCONTINUED] SitaGLIPtin-MetFORMIN HCl (JANUMET XR) [id number] MG TB24 TAKE 1 TABLET BY MOUTH AFTER SUPPER 90 tablet 1   No current facility-administered medications on file prior to visit.     Past Surgical History:  Procedure Laterality Date  . ABDOMINAL HERNIA REPAIR    . CHOLECYSTECTOMY    . HERNIA REPAIR    . LAPAROSCOPIC  APPENDECTOMY N/A 08/16/2014   Procedure: APPENDECTOMY LAPAROSCOPIC;  Surgeon: Erroll Luna, MD;  Location: [facility];  Service: General;  Laterality: N/A;  . MASS EXCISION  05/22/2012   Procedure: EXCISION MASS;  Surgeon: Joyice Faster. Cornett, MD;  Location: [facility];  Service: General;  Laterality: Left;  excision abdominal wall mass  . ROTATOR CUFF REPAIR      Allergies  Allergen Reactions  . Contrast Media [Iodinated Diagnostic Agents] Swelling    Can take if premedicated with diphenhydramine  . Iodine Swelling     Social History   Social History  . Marital status: Married    Spouse name: N/A  . Number of children: 3  . Years of education: N/A   Occupational History  .      Security officer [employer]   Social History Main Topics  . Smoking status: Never Smoker  . Smokeless tobacco: Never Used  . Alcohol use No  . Drug use: No  . Sexual activity: Not on file   Other Topics Concern  . Not on file   Social History Narrative  . No narrative on file    Family History  Problem Relation Age of Onset  . Lung cancer Mother   . Diabetes Maternal Aunt   . Cancer Paternal Grandmother   . Breast cancer Neg Hx   . Colon cancer Neg Hx   . Prostate cancer Neg Hx   . Heart disease Neg Hx     BP 121/84   Pulse 96   Ht 6' (1.829 m)   Wt 190 lb (86.2 kg)   BMI 25.77 kg/m   Review of Systems: See HPI above.     Objective:  Physical Exam:  Gen: NAD, comfortable in exam room  Right elbow: No gross deformity, swelling, bruising. TTP triceps at insertion, lateral epicondyle. No other tenderness about elbow. FROM with mild pain on elbow extension, wrist extension and 3rd digit extension. Strength 5/5 all elbow and wrist motions. Collateral ligaments intact. NVI distally.  Left elbow:  FROM without pain.   Assessment & Plan:  1. Right triceps tendinitis, lateral epicondylitis - Continue with compression sleeve and home exercise program.  He will add nitro patches as well.  Heat, tylenol or aleve if needed.  Declined PT, injection for epicondylitis for now.  F/u in 6 weeks.

## 2016-11-14 NOTE — Assessment & Plan Note (Signed)
Continue with compression sleeve and home exercise program.  He will add nitro patches as well.  Heat, tylenol or aleve if needed.  Declined PT, injection for epicondylitis for now.  F/u in 6 weeks.

## 2016-11-20 MED FILL — INVOKANA 100 MG TABLET: 100 | 30 days supply | Qty: 30 | Fill #1

## 2016-11-27 MED FILL — ACCU-CHEK GUIDE TEST STRIP: 30 days supply | Qty: 50 | Fill #1

## 2016-12-06 MED FILL — VICTOZA 18 MG/3 ML INJECT P: 18 | 30 days supply | Qty: 9 | Fill #1

## 2016-12-07 ENCOUNTER — Ambulatory Visit (HOSPITAL_BASED_OUTPATIENT_CLINIC_OR_DEPARTMENT_OTHER)
Admission: RE | Admit: 2016-12-07 | Discharge: 2016-12-07 | Disposition: A | Discharge status: Home / Self Care | Payer: 59 | Source: Ambulatory Visit | Attending: Family Medicine | Admitting: Family Medicine

## 2016-12-07 ENCOUNTER — Encounter: Payer: Self-pay | Admitting: Family Medicine

## 2016-12-07 ENCOUNTER — Ambulatory Visit (INDEPENDENT_AMBULATORY_CARE_PROVIDER_SITE_OTHER): Payer: 59 | Admitting: Family Medicine

## 2016-12-07 VITALS — BP 132/82 | HR 97 | Temp 98.0°F | Ht 72.0 in | Wt 186.4 lb

## 2016-12-07 DIAGNOSIS — I700 Atherosclerosis of aorta: Secondary | ICD-10-CM | POA: Insufficient documentation

## 2016-12-07 DIAGNOSIS — M4184 Other forms of scoliosis, thoracic region: Secondary | ICD-10-CM | POA: Insufficient documentation

## 2016-12-07 DIAGNOSIS — M62830 Muscle spasm of back: Secondary | ICD-10-CM

## 2016-12-07 DIAGNOSIS — M549 Dorsalgia, unspecified: Secondary | ICD-10-CM | POA: Diagnosis not present

## 2016-12-07 DIAGNOSIS — M546 Pain in thoracic spine: Secondary | ICD-10-CM | POA: Insufficient documentation

## 2016-12-07 DIAGNOSIS — M47814 Spondylosis without myelopathy or radiculopathy, thoracic region: Secondary | ICD-10-CM | POA: Diagnosis not present

## 2016-12-07 LAB — POC URINALSYSI DIPSTICK (AUTOMATED)
BILIRUBIN UA: NEGATIVE
Ketones, UA: NEGATIVE
Leukocytes, UA: NEGATIVE
NITRITE UA: NEGATIVE
PH UA: 6 (ref 5.0–8.0)
Protein, UA: NEGATIVE
RBC UA: NEGATIVE
Spec Grav, UA: 1.02 (ref 1.010–1.025)
Urobilinogen, UA: 0.2 E.U./dL

## 2016-12-07 MED ORDER — CYCLOBENZAPRINE HCL 10 MG PO TABS: 10.0000 mg | ORAL_TABLET | Freq: Two times a day (BID) | ORAL | 0 refills | Status: DC | PRN

## 2016-12-07 MED FILL — CYCLOBENZAPRINE 10 MG TAB: 10 | 15 days supply | Qty: 30 | Fill #0

## 2016-12-07 MED FILL — AMOXICILLIN 500 MG CAPSULE: 500 | 9 days supply | Qty: 25 | Fill #0

## 2016-12-07 MED FILL — IBUPROFEN 800 MG TABLET: 800 | 3 days supply | Qty: 15 | Fill #0

## 2016-12-07 NOTE — Patient Instructions (Signed)
Please have films done of your back and chest today- then you are all set to head home.  Use the flexeril as needed for back spasms.  Remember this medication can make you sleepy - do not use it when you are driving  I think that you have a muscular issue- spasms- in your back.  However if you develop any other symptoms or concerns or if you are not feeling better soon please let me know

## 2016-12-07 NOTE — Progress Notes (Signed)
[place] at [facility] [address] [place], [place] [address] [phone]  Date:  12/07/2016   Name:  Albert [name]   DOB:  1955/11/02   MRN:  [id number]  PCP:  [name], MD    Chief Complaint: Back Pain (c/o constant right sided flank pain x 1 week. Denies any urinary sx's . )   History of Present Illness:  Albert [name] is a 61 y.o. very pleasant male patient who presents with the following:  History of DM managed by Dr. Dwyane Dee- last seen by myself over the winter for a sick visit.  Here today with concern of flank pain for about one week- perhaps a little longer.   The area is sore to the touch.  He is not sure of anything that caused this pain.  He does exercise- push ups, light weight training.  However this is nothing new and he has not made any changes to his routine.   No recent cough or fever No urinary symptoms No blood in his urine He has had a kidney stone but this is different than the stoens he had in the past.    He works in security at [employer] He is using tylenol and heat so far  Lab Results  Component Value Date   HGBA1C 9.1 (H) 08/28/2016       Patient Active Problem List   Diagnosis Date Noted  . Elbow pain, chronic, right 08/13/2016  . Tendinitis of finger of left hand 07/31/2016  . Tendinitis of right triceps 07/31/2016  . 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 Atypical chest pain 11/01/2014  . Hyperlipidemia, mixed 07/26/2014  . 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 Preventative health care 07/26/2014  . BPH (benign prostatic hyperplasia) 12/11/2013  . Diverticulosis 10/26/2013  . Acute neck pain 06/04/2013  . Insomnia 02/25/2013  . Sciatica of right side 12/22/2012  . HTN (hypertension) 11/22/2012  . Right bundle branch block 11/15/2012  . DM (diabetes mellitus), type 2 with neurological complications ([place]) 09/62/8366  . Lumbar radiculopathy  02/09/2012    Past Medical History:  Diagnosis Date  . Colon polyps    adenomatous  . Diverticulosis 10/26/2013  . DM (diabetes mellitus), type 2 with neurological complications ([place]) 2/94/7654  . 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 History of chicken pox    childhood  . History of kidney stones   . Hyperlipidemia 07/26/2014  . Hypertension   . Insomnia 02/25/2013  . Preventative health care 07/26/2014  . Renal lithiasis 02/21/2015  . Sciatica of right side 12/22/2012  . Seasonal allergies   . Squamous cell carcinoma in situ 2014   forehead, s/p excision by derm    Past Surgical History:  Procedure Laterality Date  . ABDOMINAL HERNIA REPAIR    . CHOLECYSTECTOMY    . HERNIA REPAIR    . LAPAROSCOPIC APPENDECTOMY N/A 08/16/2014   Procedure: APPENDECTOMY LAPAROSCOPIC;  Surgeon: Erroll Luna, MD;  Location: [place];  Service: General;  Laterality: N/A;  . MASS EXCISION  05/22/2012   Procedure: EXCISION MASS;  Surgeon: Joyice Faster. Cornett, MD;  Location: [place];  Service: General;  Laterality: Left;  excision abdominal wall mass  . ROTATOR CUFF REPAIR      Social History  Substance Use Topics  . Smoking status: Never Smoker  . Smokeless tobacco: Never Used  . Alcohol use No    Family  History  Problem Relation Age of Onset  . Lung cancer Mother   . Diabetes Maternal Aunt   . Cancer Paternal Grandmother   . Breast cancer Neg Hx   . Colon cancer Neg Hx   . Prostate cancer Neg Hx   . Heart disease Neg Hx     Allergies  Allergen Reactions  . Contrast Media [Iodinated Diagnostic Agents] Swelling    Can take if premedicated with diphenhydramine  . Iodine Swelling    Medication list has been reviewed and updated.  Current Outpatient Prescriptions on File Prior to Visit  Medication Sig Dispense Refill  . aspirin EC 81 MG tablet Take 1 tablet (81 mg total) by mouth daily.    Marland Kitchen atorvastatin (LIPITOR) 40 MG tablet TAKE 1 TABLET BY  MOUTH DAILY 90 tablet 1  . B-D UF III MINI PEN NEEDLES 31G X 5 MM MISC USE TO INJECT LANTUS DAILY 100 each 2  . glucose blood (ACCU-CHEK GUIDE) test strip Use to test blood sugar once daily Dx code E11.49 50 each 4  . INVOKANA 100 MG TABS tablet TAKE 1 TABLET BY MOUTH DAILY BEFORE BREAKFAST 30 tablet 3  . Lancets (ACCU-CHEK MULTICLIX) lancets Use to test blood sugar once daily Dx code E11.49 100 each 12  . liraglutide (VICTOZA) 18 MG/3ML SOPN Inject 0.3 mLs (1.8 mg total) into the skin daily. 9 pen 3  . lisinopril (PRINIVIL,ZESTRIL) 5 MG tablet Take 1 tablet (5 mg total) by mouth daily. 90 tablet 3  . loperamide (IMODIUM A-D) 2 MG tablet Take 2 mg by mouth 4 (four) times daily as needed for diarrhea or loose stools.    . Multiple Vitamins-Minerals (MULTIVITAMIN WITH MINERALS) tablet Take 1 tablet by mouth daily.    . mupirocin ointment (BACTROBAN) 2 % Place 1 application into the nose 2 (two) times daily. 22 g 0  . nitroGLYCERIN (NITRODUR - DOSED IN MG/24 HR) 0.2 mg/hr patch Apply 1/4th patch to affected elbow, change daily 30 patch 1  . sodium chloride (OCEAN) 0.65 % SOLN nasal spray Place 1 spray into both nostrils as needed for congestion.    . [DISCONTINUED] SitaGLIPtin-MetFORMIN HCl (JANUMET XR) [phone] MG TB24 TAKE 1 TABLET BY MOUTH AFTER SUPPER 90 tablet 1   No current facility-administered medications on file prior to visit.     Review of Systems:  As per HPI- otherwise negative. No SOB, CP, fever, chills, or appetite change.  He exercises normally and no change in his exercise tolerance    Physical Examination: Vitals:   12/07/16 0855  BP: 132/82  Pulse: 97  Temp: 98 F (36.7 C)   Vitals:   12/07/16 0855  Weight: 186 lb 6.4 oz (84.6 kg)  Height: 6' (1.829 m)   Body mass index is 25.28 kg/m. Ideal Body Weight: Weight in (lb) to have BMI = 25: 183.9  GEN: WDWN, NAD, Non-toxic, A & O x 3, normal weight, looks well.  Here today with his wife HEENT: Atraumatic,  Normocephalic. Neck supple. No masses, No LAD.  Bilateral TM wnl, oropharynx normal.  PEERL,EOMI.   Ears and Nose: No external deformity. CV: RRR, No M/G/R. No JVD. No thrill. No extra heart sounds. PULM: CTA B, no wheezes, crackles, rhonchi. No retractions. No resp. distress. No accessory muscle use. ABD: S, NT, ND, +BS. No rebound. No HSM.  Belly is benign.  Pain does not radiate into his chest  EXTR: No c/c/e NEURO Normal gait.  PSYCH: Normally interactive. Conversant. Not depressed or  anxious appearing.  Calm demeanor.  He is tender over the right side of his thoracic back- muscular, not bony.  No rash or any hint of shingles noted.  No redness, swelling or lesion Results for orders placed or performed in visit on 12/07/16  POCT Urinalysis Dipstick (Automated)  Result Value Ref Range   Color, UA yellow    Clarity, UA clear    Glucose, UA 2+    Bilirubin, UA negative    Ketones, UA negative    Spec Grav, UA 1.020 1.010 - 1.025   Blood, UA negative    pH, UA 6.0 5.0 - 8.0   Protein, UA negative    Urobilinogen, UA 0.2 0.2 or 1.0 E.U./dL   Nitrite, UA negative    Leukocytes, UA Negative Negative   Dg Chest 2 View  Result Date: 12/07/2016 CLINICAL DATA:  Mid back pain EXAM: CHEST  2 VIEW COMPARISON:  01/31/2015 FINDINGS: Scarring in the lingula. Lungs otherwise clear. Heart is normal size. No effusions or acute bony abnormality. IMPRESSION: No active cardiopulmonary disease. Electronically Signed   By: Rolm Baptise M.D.   On: 12/07/2016 09:34   Dg Thoracic Spine 2 View  Result Date: 12/07/2016 CLINICAL DATA:  Mid back pain on RIGHT for week, no known injury, acute RIGHT-side thoracic back pain and muscle spasms of the back EXAM: THORACIC SPINE 2 VIEWS COMPARISON:  Chest radiograph 01/31/2015 FINDINGS: Twelve pairs of ribs. Bones appear demineralized. Minimal biconvex scoliosis. Vertebral body heights maintained without fracture or bone destruction. Minimal scattered disc space  narrowing. Aortic atherosclerotic calcification at arch. IMPRESSION: Minimal degenerative changes and scoliosis. No acute abnormalities. Aortic atherosclerosis. Electronically Signed   By: Lavonia Dana M.D.   On: 12/07/2016 09:35     Assessment and Plan: Muscle spasm of back - Plan: DG Chest 2 View, DG Thoracic Spine 2 View, cyclobenzaprine (FLEXERIL) 10 MG tablet  Acute right-sided thoracic back pain - Plan: DG Chest 2 View, DG Thoracic Spine 2 View, POCT Urinalysis Dipstick (Automated)  Here today with likely muscular back pain Will obtain films to make sure nothing unexpected Assuming ok will use flexeril, he will let me know if not improved soon Urine is negative except for glucose which is expected with his invokana  Signed Lamar Blinks, MD

## 2016-12-11 MED FILL — IBUPROFEN 800 MG TABLET: 800 | 4 days supply | Qty: 15 | Fill #0

## 2016-12-19 ENCOUNTER — Encounter: Payer: Self-pay | Admitting: Endocrinology

## 2016-12-19 MED FILL — ATORVASTATIN 40 MG TABLET: 40 | 90 days supply | Qty: 90 | Fill #0

## 2016-12-19 MED FILL — INVOKANA 100 MG TABLET: 100 | 30 days supply | Qty: 30 | Fill #2

## 2016-12-20 ENCOUNTER — Other Ambulatory Visit: Payer: Self-pay

## 2016-12-20 MED ORDER — METFORMIN HCL ER 500 MG PO TB24: 1000.0000 mg | ORAL_TABLET | Freq: Every day | ORAL | 2 refills | Status: DC

## 2016-12-20 MED FILL — METFORMIN HCL ER 500 MG TAB: 500 | 90 days supply | Qty: 180 | Fill #0

## 2016-12-25 MED FILL — LISINOPRIL 5 MG TABLET: 5 | 90 days supply | Qty: 90 | Fill #1

## 2017-01-01 ENCOUNTER — Ambulatory Visit (INDEPENDENT_AMBULATORY_CARE_PROVIDER_SITE_OTHER): Payer: 59 | Admitting: Family Medicine

## 2017-01-01 ENCOUNTER — Encounter: Payer: Self-pay | Admitting: Family Medicine

## 2017-01-01 DIAGNOSIS — I10 Essential (primary) hypertension: Secondary | ICD-10-CM | POA: Diagnosis not present

## 2017-01-01 DIAGNOSIS — E1149 Type 2 diabetes mellitus with other diabetic neurological complication: Secondary | ICD-10-CM | POA: Diagnosis not present

## 2017-01-01 DIAGNOSIS — M5416 Radiculopathy, lumbar region: Secondary | ICD-10-CM

## 2017-01-01 DIAGNOSIS — E782 Mixed hyperlipidemia: Secondary | ICD-10-CM | POA: Diagnosis not present

## 2017-01-01 NOTE — Patient Instructions (Signed)
Carbohydrate Counting for Diabetes Mellitus, Adult Carbohydrate counting is a method for keeping track of how many carbohydrates you eat. Eating carbohydrates naturally increases the amount of sugar (glucose) in the blood. Counting how many carbohydrates you eat helps keep your blood glucose within normal limits, which helps you manage your diabetes (diabetes mellitus). It is important to know how many carbohydrates you can safely have in each meal. This is different for every person. A diet and nutrition specialist (registered dietitian) can help you make a meal plan and calculate how many carbohydrates you should have at each meal and snack. Carbohydrates are found in the following foods:  Grains, such as breads and cereals.  Dried beans and soy products.  Starchy vegetables, such as potatoes, peas, and corn.  Fruit and fruit juices.  Milk and yogurt.  Sweets and snack foods, such as cake, cookies, candy, chips, and soft drinks. How do I count carbohydrates? There are two ways to count carbohydrates in food. You can use either of the methods or a combination of both. Reading "Nutrition Facts" on packaged food  The "Nutrition Facts" list is included on the labels of almost all packaged foods and beverages in the [place] It includes:  The serving size.  Information about nutrients in each serving, including the grams (g) of carbohydrate per serving. To use the "Nutrition Facts":  Decide how many servings you will have.  Multiply the number of servings by the number of carbohydrates per serving.  The resulting number is the total amount of carbohydrates that you will be having. Learning standard serving sizes of other foods  When you eat foods containing carbohydrates that are not packaged or do not include "Nutrition Facts" on the label, you need to measure the servings in order to count the amount of carbohydrates:  Measure the foods that you will eat with a food scale or measuring  cup, if needed.  Decide how many standard-size servings you will eat.  Multiply the number of servings by 15. Most carbohydrate-rich foods have about 15 g of carbohydrates per serving.  For example, if you eat 8 oz (170 g) of strawberries, you will have eaten 2 servings and 30 g of carbohydrates (2 servings x 15 g = 30 g).  For foods that have more than one food mixed, such as soups and casseroles, you must count the carbohydrates in each food that is included. The following list contains standard serving sizes of common carbohydrate-rich foods. Each of these servings has about 15 g of carbohydrates:   hamburger bun or  English muffin.   oz (15 mL) syrup.   oz (14 g) jelly.  1 slice of bread.  1 six-inch tortilla.  3 oz (85 g) cooked rice or pasta.  4 oz (113 g) cooked dried beans.  4 oz (113 g) starchy vegetable, such as peas, corn, or potatoes.  4 oz (113 g) hot cereal.  4 oz (113 g) mashed potatoes or  of a large baked potato.  4 oz (113 g) canned or frozen fruit.  4 oz (120 mL) fruit juice.  4-6 crackers.  6 chicken nuggets.  6 oz (170 g) unsweetened dry cereal.  6 oz (170 g) plain fat-free yogurt or yogurt sweetened with artificial sweeteners.  8 oz (240 mL) milk.  8 oz (170 g) fresh fruit or one small piece of fruit.  24 oz (680 g) popped popcorn. Example of carbohydrate counting Sample meal  3 oz (85 g) chicken breast.  6 oz (  170 g) brown rice.  4 oz (113 g) corn.  8 oz (240 mL) milk.  8 oz (170 g) strawberries with sugar-free whipped topping. Carbohydrate calculation 1. Identify the foods that contain carbohydrates:  Rice.  Corn.  Milk.  Strawberries. 2. Calculate how many servings you have of each food:  2 servings rice.  1 serving corn.  1 serving milk.  1 serving strawberries. 3. Multiply each number of servings by 15 g:  2 servings rice x 15 g = 30 g.  1 serving corn x 15 g = 15 g.  1 serving milk x 15 g = 15  g.  1 serving strawberries x 15 g = 15 g. 4. Add together all of the amounts to find the total grams of carbohydrates eaten:  30 g + 15 g + 15 g + 15 g = 75 g of carbohydrates total. This information is not intended to replace advice given to you by your health care provider. Make sure you discuss any questions you have with your health care provider. Document Released: 07/31/2005 Document Revised: 02/18/2016 Document Reviewed: 01/12/2016 Elsevier Interactive Patient Education  2017 [employer]  

## 2017-01-01 NOTE — Assessment & Plan Note (Signed)
Tolerating statin, encouraged heart healthy diet, avoid trans fats, minimize simple carbs and saturated fats. Increase exercise as tolerated 

## 2017-01-01 NOTE — Assessment & Plan Note (Signed)
Well controlled, no changes to meds. Encouraged heart healthy diet such as the DASH diet and exercise as tolerated.  °

## 2017-01-01 NOTE — Assessment & Plan Note (Signed)
Feeling better with weight loss and routine exercise

## 2017-01-01 NOTE — Assessment & Plan Note (Addendum)
hgba1c unacceptable, minimize simple carbs. Increase exercise as tolerated. Has recently had Victoza started and is doing well. Sugars in 100s

## 2017-01-01 NOTE — Progress Notes (Signed)
Pre visit review using our clinic review tool, if applicable. No additional management support is needed unless otherwise documented below in the visit note. 

## 2017-01-01 NOTE — Progress Notes (Signed)
Patient ID: SAMUL MCINROY, male   DOB: [birth year], 61 y.o.   MRN: [id number]   Subjective:  I acted as a Education administrator for [name], [place], [place]   Patient ID: [name], male    DOB: 17-Apr-1956, 61 y.o.   MRN: [id number]  Chief Complaint  Patient presents with  . Diabetes    66-month F/U.    Diabetes  He presents for his follow-up diabetic visit. He has type 2 diabetes mellitus. Pertinent negatives for hypoglycemia include no headaches. Pertinent negatives for diabetes include no blurred vision and no chest pain.    Patient is in today for a 70-month follow up for Diabetes. Patient states that his glucose numbers have been between 100-166. Patient has a Hx of HTN, Type II Diabetes, BPH, insomnia. Patient has no acute concerns noted at this time. Is doing very well. He has been exercising regularly and eating a heart healthy diet. Denies CP/palp/SOB/HA/congestion/fevers/GI or GU c/o. Taking meds as prescribed  Patient Care Team: Mosie Lukes, MD as PCP - General (Family Medicine)   Past Medical History:  Diagnosis Date  . Colon polyps    adenomatous  . Diverticulosis 10/26/2013  . DM (diabetes mellitus), type 2 with neurological complications ([place]) 7/90/2409  . 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 History of chicken pox    childhood  . History of kidney stones   . Hyperlipidemia 07/26/2014  . Hypertension   . Insomnia 02/25/2013  . Preventative health care 07/26/2014  . Renal lithiasis 02/21/2015  . Sciatica of right side 12/22/2012  . Seasonal allergies   . Squamous cell carcinoma in situ 2014   forehead, s/p excision by derm    Past Surgical History:  Procedure Laterality Date  . ABDOMINAL HERNIA REPAIR    . CHOLECYSTECTOMY    . HERNIA REPAIR    . LAPAROSCOPIC APPENDECTOMY N/A 08/16/2014   Procedure: APPENDECTOMY LAPAROSCOPIC;  Surgeon: Erroll Luna, MD;  Location: [facility];  Service: General;  Laterality: N/A;  . MASS EXCISION  05/22/2012   Procedure: EXCISION MASS;  Surgeon: Joyice Faster. Cornett, MD;  Location: [facility];  Service: General;  Laterality: Left;  excision abdominal wall mass  . ROTATOR CUFF REPAIR      Family History  Problem Relation Age of Onset  . Lung cancer Mother   . Diabetes Maternal Aunt   . Cancer Paternal Grandmother   . Breast cancer Neg Hx   . Colon cancer Neg Hx   . Prostate cancer Neg Hx   . Heart disease Neg Hx     Social History   Social History  . Marital status: Married    Spouse name: N/A  . Number of children: 3  . Years of education: N/A   Occupational History  .      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 Smoking status: Never Smoker  . Smokeless tobacco: Never Used  . Alcohol use No  . Drug use: No  . Sexual activity: Not on file   Other Topics Concern  . Not on file   Social History Narrative  . No narrative on file    Outpatient Medications Prior to Visit  Medication Sig Dispense Refill  . aspirin EC 81 MG tablet Take 1 tablet (81 mg total) by mouth daily.    Marland Kitchen atorvastatin (LIPITOR) 40 MG tablet TAKE 1 TABLET BY MOUTH DAILY 90 tablet 1  . glucose blood (ACCU-CHEK GUIDE) test strip  Use to test blood sugar once daily Dx code E11.49 50 each 4  . INVOKANA 100 MG TABS tablet TAKE 1 TABLET BY MOUTH DAILY BEFORE BREAKFAST 30 tablet 3  . liraglutide (VICTOZA) 18 MG/3ML SOPN Inject 0.3 mLs (1.8 mg total) into the skin daily. 9 pen 3  . lisinopril (PRINIVIL,ZESTRIL) 5 MG tablet Take 1 tablet (5 mg total) by mouth daily. 90 tablet 3  . loperamide (IMODIUM A-D) 2 MG tablet Take 2 mg by mouth 4 (four) times daily as needed for diarrhea or loose stools.    . metFORMIN (GLUCOPHAGE XR) 500 MG 24 hr tablet Take 2 tablets (1,000 mg total) by mouth daily with breakfast. 60 tablet 2  . Multiple Vitamins-Minerals (MULTIVITAMIN WITH MINERALS) tablet Take 1 tablet by mouth daily.    . mupirocin ointment (BACTROBAN) 2 % Place 1 application into the nose 2  (two) times daily. 22 g 0  . nitroGLYCERIN (NITRODUR - DOSED IN MG/24 HR) 0.2 mg/hr patch Apply 1/4th patch to affected elbow, change daily 30 patch 1  . sodium chloride (OCEAN) 0.65 % SOLN nasal spray Place 1 spray into both nostrils as needed for congestion.    . B-D UF III MINI PEN NEEDLES 31G X 5 MM MISC USE TO INJECT LANTUS DAILY (Patient not taking: Reported on 01/01/2017) 100 each 2  . cyclobenzaprine (FLEXERIL) 10 MG tablet Take 1 tablet (10 mg total) by mouth 2 (two) times daily as needed for muscle spasms. (Patient not taking: Reported on 01/01/2017) 30 tablet 0  . Lancets (ACCU-CHEK MULTICLIX) lancets Use to test blood sugar once daily Dx code E11.49 (Patient not taking: Reported on 01/01/2017) 100 each 12   No facility-administered medications prior to visit.     Allergies  Allergen Reactions  . Contrast Media [Iodinated Diagnostic Agents] Swelling    Can take if premedicated with diphenhydramine  . Iodine Swelling    Review of Systems  Constitutional: Negative for fever and malaise/fatigue.  HENT: Negative for congestion.   Eyes: Negative for blurred vision.  Respiratory: Negative for cough and shortness of breath.   Cardiovascular: Negative for chest pain, palpitations and leg swelling.  Gastrointestinal: Negative for vomiting.  Musculoskeletal: Negative for back pain.  Skin: Negative for rash.  Neurological: Negative for loss of consciousness and headaches.       Objective:    Physical Exam  Constitutional: He is oriented to person, place, and time. He appears well-developed and well-nourished. No distress.  HENT:  Head: Normocephalic and atraumatic.  Eyes: Conjunctivae are normal.  Neck: Normal range of motion. No thyromegaly present.  Cardiovascular: Normal rate and regular rhythm.   Pulmonary/Chest: Effort normal and breath sounds normal. He has no wheezes.  Abdominal: Soft. Bowel sounds are normal. There is no tenderness.  Musculoskeletal: He exhibits no  edema or deformity.  Neurological: He is alert and oriented to person, place, and time.  Skin: Skin is warm and dry. He is not diaphoretic.  Psychiatric: He has a normal mood and affect.    BP 109/74 (BP Location: Left Arm, Patient Position: Sitting, Cuff Size: Normal)   Pulse 100   Temp 98.2 F (36.8 C) (Oral)   Wt 179 lb 9.6 oz (81.5 kg)   SpO2 97% Comment: RA  BMI 24.36 kg/m  Wt Readings from Last 3 Encounters:  01/01/17 179 lb 9.6 oz (81.5 kg)  12/07/16 186 lb 6.4 oz (84.6 kg)  11/13/16 190 lb (86.2 kg)   BP Readings from Last 3 Encounters:  01/01/17 109/74  12/07/16 132/82  11/13/16 121/84     Immunization History  Administered Date(s) Administered  . Influenza,inj,Quad PF,36+ Mos 06/02/2013  . Influenza-Unspecified 05/05/2014, 05/02/2015, 05/08/2016  . Pneumococcal Conjugate-13 06/02/2013  . Pneumococcal Polysaccharide-23 09/07/2015  . Pneumococcal-Unspecified 08/16/2009  . Tdap 08/22/2010    Health Maintenance  Topic Date Due  . OPHTHALMOLOGY EXAM  09/02/2016  . FOOT EXAM  10/06/2016  . HEMOGLOBIN A1C  02/25/2017  . INFLUENZA VACCINE  03/14/2017  . COLONOSCOPY  10/23/2018  . TETANUS/TDAP  08/22/2020  . PNEUMOCOCCAL POLYSACCHARIDE VACCINE  Completed  . Hepatitis C Screening  Completed  . HIV Screening  Completed    Lab Results  Component Value Date   WBC 8.0 10/03/2016   HGB 14.9 10/03/2016   HCT 43.5 10/03/2016   PLT 235.0 10/03/2016   GLUCOSE 149 (H) 10/17/2016   CHOL 105 10/03/2016   TRIG 161.0 (H) 10/03/2016   HDL 31.00 (L) 10/03/2016   LDLDIRECT 109.0 10/26/2014   LDLCALC 42 10/03/2016   ALT 25 10/03/2016   AST 17 10/03/2016   NA 135 10/03/2016   K 3.9 10/03/2016   CL 104 10/03/2016   CREATININE 0.87 10/03/2016   BUN 14 10/03/2016   CO2 22 10/03/2016   TSH 0.85 10/03/2016   PSA 0.64 09/12/2013   INR 1.10 08/16/2014   HGBA1C 9.1 (H) 08/28/2016   MICROALBUR <0.7 08/28/2016    Lab Results  Component Value Date   TSH 0.85 10/03/2016     Lab Results  Component Value Date   WBC 8.0 10/03/2016   HGB 14.9 10/03/2016   HCT 43.5 10/03/2016   MCV 88.9 10/03/2016   PLT 235.0 10/03/2016   Lab Results  Component Value Date   NA 135 10/03/2016   K 3.9 10/03/2016   CO2 22 10/03/2016   GLUCOSE 149 (H) 10/17/2016   BUN 14 10/03/2016   CREATININE 0.87 10/03/2016   BILITOT 0.5 10/03/2016   ALKPHOS 71 10/03/2016   AST 17 10/03/2016   ALT 25 10/03/2016   PROT 7.6 10/03/2016   ALBUMIN 4.3 10/03/2016   CALCIUM 9.4 10/03/2016   ANIONGAP 11 01/31/2015   GFR 95.66 10/03/2016   Lab Results  Component Value Date   CHOL 105 10/03/2016   Lab Results  Component Value Date   HDL 31.00 (L) 10/03/2016   Lab Results  Component Value Date   LDLCALC 42 10/03/2016   Lab Results  Component Value Date   TRIG 161.0 (H) 10/03/2016   Lab Results  Component Value Date   CHOLHDL 3 10/03/2016   Lab Results  Component Value Date   HGBA1C 9.1 (H) 08/28/2016         Assessment & Plan:   Problem List Items Addressed This Visit    DM (diabetes mellitus), type 2 with neurological complications ([place])    [id number] unacceptable, minimize simple carbs. Increase exercise as tolerated. Has recently had Victoza started and is doing well. Sugars in 100s      Lumbar radiculopathy    Feeling better with weight loss and routine exercise      HTN (hypertension)    Well controlled, no changes to meds. Encouraged heart healthy diet such as the DASH diet and exercise as tolerated.       Relevant Orders   Comprehensive metabolic panel   CBC   TSH   Hyperlipidemia, mixed    Tolerating statin, encouraged heart healthy diet, avoid trans fats, minimize simple carbs and saturated fats. Increase exercise as tolerated  Relevant Orders   Lipid panel      I have discontinued Mr. Sossamon accu-chek multiclix, B-D UF III MINI PEN NEEDLES, and cyclobenzaprine. I am also having him maintain his loperamide, aspirin EC, multivitamin with  minerals, mupirocin ointment, sodium chloride, atorvastatin, INVOKANA, lisinopril, glucose blood, liraglutide, nitroGLYCERIN, and metFORMIN.  No orders of the defined types were placed in this encounter.   CMA served as Education administrator during this visit. History, Physical and Plan performed by medical provider. Documentation and orders reviewed and attested to.  [name], MD

## 2017-01-04 ENCOUNTER — Telehealth: Payer: Self-pay | Admitting: Family Medicine

## 2017-01-04 NOTE — Telephone Encounter (Signed)
Called left message to call back 

## 2017-01-04 NOTE — Telephone Encounter (Signed)
He hs future orders minus the A1C because I think endo is doing those, just confirm with patient and then add a psa to the future labs and make his appt. Thanks

## 2017-01-04 NOTE — Telephone Encounter (Signed)
Caller name: Relationship to patient: Can be reached: [phone] Pharmacy:  Reason for call:Wants to come in for labs 04/27/17, has CPE on 05/03/17. Please advise

## 2017-01-05 ENCOUNTER — Other Ambulatory Visit (INDEPENDENT_AMBULATORY_CARE_PROVIDER_SITE_OTHER): Payer: 59

## 2017-01-05 ENCOUNTER — Other Ambulatory Visit: Payer: Self-pay | Admitting: Family Medicine

## 2017-01-05 DIAGNOSIS — Z794 Long term (current) use of insulin: Secondary | ICD-10-CM

## 2017-01-05 DIAGNOSIS — E1165 Type 2 diabetes mellitus with hyperglycemia: Secondary | ICD-10-CM | POA: Diagnosis not present

## 2017-01-05 DIAGNOSIS — N400 Enlarged prostate without lower urinary tract symptoms: Secondary | ICD-10-CM

## 2017-01-05 LAB — COMPREHENSIVE METABOLIC PANEL
ALBUMIN: 4.8 g/dL (ref 3.5–5.2)
ALK PHOS: 75 U/L (ref 39–117)
ALT: 22 U/L (ref 0–53)
AST: 21 U/L (ref 0–37)
BILIRUBIN TOTAL: 0.7 mg/dL (ref 0.2–1.2)
BUN: 19 mg/dL (ref 6–23)
CALCIUM: 10.5 mg/dL (ref 8.4–10.5)
CO2: 24 mEq/L (ref 19–32)
Chloride: 101 mEq/L (ref 96–112)
Creatinine, Ser: 1.03 mg/dL (ref 0.40–1.50)
GFR: 78.65 mL/min (ref >60.00)
GLUCOSE: 112 mg/dL — AB (ref 70–99)
Potassium: 4.5 mEq/L (ref 3.5–5.1)
Sodium: 135 mEq/L (ref 135–145)
TOTAL PROTEIN: 8.2 g/dL (ref 6.0–8.3)

## 2017-01-05 LAB — HEMOGLOBIN A1C: HEMOGLOBIN A1C: 7.5 % — AB (ref 4.6–6.5)

## 2017-01-05 NOTE — Telephone Encounter (Signed)
Patient informed and did get a1c done through Endo He has lab appt made already for 04/27/17 and added the order for PSA

## 2017-01-08 ENCOUNTER — Encounter: Payer: Self-pay | Admitting: Family Medicine

## 2017-01-10 MED ORDER — NITROGLYCERIN 0.2 MG/HR TD PT24: MEDICATED_PATCH | TRANSDERMAL | 3 refills | Status: DC

## 2017-01-11 ENCOUNTER — Encounter: Payer: Self-pay | Admitting: Endocrinology

## 2017-01-11 ENCOUNTER — Ambulatory Visit (INDEPENDENT_AMBULATORY_CARE_PROVIDER_SITE_OTHER): Payer: 59 | Admitting: Endocrinology

## 2017-01-11 VITALS — BP 118/76 | HR 93 | Ht 72.0 in | Wt 181.6 lb

## 2017-01-11 DIAGNOSIS — E1165 Type 2 diabetes mellitus with hyperglycemia: Secondary | ICD-10-CM | POA: Diagnosis not present

## 2017-01-11 MED ORDER — CANAGLIFLOZIN 300 MG PO TABS: 300.0000 mg | ORAL_TABLET | Freq: Every day | ORAL | 3 refills | Status: DC

## 2017-01-11 MED FILL — NITROGLYCERIN 0.2 MG/HR PAT: 0.2 | 30 days supply | Qty: 30 | Fill #0

## 2017-01-11 MED FILL — INVOKANA 300 MG TABLET: 300 | 90 days supply | Qty: 90 | Fill #0

## 2017-01-11 NOTE — Progress Notes (Signed)
[name], male   DOB: [birth year], 61 y.o.   MRN: [id number]           Reason for Appointment:  Follow-up for Type 2 Diabetes   History of Present Illness:          Date of diagnosis of type 2 diabetes mellitus:        Background history:   He thinks his blood sugars at the time of diagnosis was around 500 and he was symptomatic with increased thirst and urination He has been taking metformin for several years but also has been tried on Amaryl Since about 2013 he has been on Januvia In early 2016 his blood sugars were significantly higher with A1c 11.9 and he was started on Lantus insulin, initially 10 units Janumet was continued Prior to consultation his A1c had gone up to 9%  Recent history:   Non-insulin hypoglycemic drugs the patient is taking are: Glucophage XR 1000 at supper, Invokana 100 mg daily  Victoza 1.8 mg daily  INSULIN regimen is: None  His A1c has improved significantly down to 7.5, previously was progressively increased last year up to 9.1  Current management, blood sugar patterns and problems identified:  His blood sugars have been fairly good overall but is checking readings primarily fasting  These are quite variable and not clear for the reason of fluctuation and they are higher in the last few days  He has only sporadic readings midday and afternoon which are not sisterly high and he forgets to check his readings after supper  He is losing weight and is trying to run for exercise  No side effects with increasing Victoza        Side effects from medications have been: Diarrhea from high dose metformin  Compliance with the medical regimen: Fair   Glucose monitoring:  done 1-2 times a day         Glucometer:  Accu-Chek Blood Glucose readings by download  Mean values apply above for all meters except median for One Touch  PRE-MEAL Fasting Lunch Dinner Bedtime Overall  Glucose range: 115-176      Mean/median: 139     143      Self-care: The diet that the patient has been following is: tries to limit high-fat foods, carbohydrates .     Meal times are:  Breakfast is at 6 AM, Lunch: 12-1 PM Dinner: 6-7 PM   Typical meal intake: Breakfast is usually  oatmeal, usually taking lunch from home to work               Dietician visit, most recent: 11/2015               Exercise:  Running on weekends or At Gym Up to 45 min during the day; Cardio and weights  Weight history:   Wt Readings from Last 3 Encounters:  01/11/17 181 lb 9.6 oz (82.4 kg)  01/01/17 179 lb 9.6 oz (81.5 kg)  12/07/16 186 lb 6.4 oz (84.6 kg)    Glycemic control:   Lab Results  Component Value Date   HGBA1C 7.5 (H) 01/05/2017   HGBA1C 9.1 (H) 08/28/2016   HGBA1C 8.2 (H) 03/31/2016   Lab Results  Component Value Date   MICROALBUR <0.7 08/28/2016   LDLCALC 42 10/03/2016   CREATININE 1.03 01/05/2017    Lab on 01/05/2017  Component Date Value Ref Range Status  . Sodium 01/05/2017 135  135 - 145 mEq/L Final  . Potassium 01/05/2017  4.5  3.5 - 5.1 mEq/L Final  . Chloride 01/05/2017 101  96 - 112 mEq/L Final  . CO2 01/05/2017 24  19 - 32 mEq/L Final  . Glucose, Bld 01/05/2017 112* 70 - 99 mg/dL Final  . BUN 01/05/2017 19  6 - 23 mg/dL Final  . Creatinine, Ser 01/05/2017 1.03  0.40 - 1.50 mg/dL Final  . Total Bilirubin 01/05/2017 0.7  0.2 - 1.2 mg/dL Final  . Alkaline Phosphatase 01/05/2017 75  39 - 117 U/L Final  . AST 01/05/2017 21  0 - 37 U/L Final  . ALT 01/05/2017 22  0 - 53 U/L Final  . Total Protein 01/05/2017 8.2  6.0 - 8.3 g/dL Final  . Albumin 01/05/2017 4.8  3.5 - 5.2 g/dL Final  . Calcium 01/05/2017 10.5  8.4 - 10.5 mg/dL Final  . GFR 01/05/2017 78.65  >60.00 mL/min Final  . Hgb A1c MFr Bld 01/05/2017 7.5* 4.6 - 6.5 % Final   Glycemic Control Guidelines for People with Diabetes:Non Diabetic:  <6%Goal of Therapy: <7%Additional Action Suggested:  >8%        Allergies as of 01/11/2017      Reactions   Contrast Media  [iodinated Diagnostic Agents] Swelling   Can take if premedicated with diphenhydramine   Iodine Swelling      Medication List       Accurate as of 01/11/17  2:00 PM. Always use your most recent med list.          aspirin EC 81 MG tablet Take 1 tablet (81 mg total) by mouth daily.   atorvastatin 40 MG tablet Commonly known as:  LIPITOR TAKE 1 TABLET BY MOUTH DAILY   canagliflozin 300 MG Tabs tablet Commonly known as:  INVOKANA Take 1 tablet (300 mg total) by mouth daily before breakfast.   glucose blood test strip Commonly known as:  ACCU-CHEK GUIDE Use to test blood sugar once daily Dx code E11.49   liraglutide 18 MG/3ML Sopn Commonly known as:  VICTOZA Inject 0.3 mLs (1.8 mg total) into the skin daily.   lisinopril 5 MG tablet Commonly known as:  PRINIVIL,ZESTRIL Take 1 tablet (5 mg total) by mouth daily.   loperamide 2 MG tablet Commonly known as:  IMODIUM A-D Take 2 mg by mouth 4 (four) times daily as needed for diarrhea or loose stools.   metFORMIN 500 MG 24 hr tablet Commonly known as:  GLUCOPHAGE XR Take 2 tablets (1,000 mg total) by mouth daily with breakfast.   multivitamin with minerals tablet Take 1 tablet by mouth daily.   mupirocin ointment 2 % Commonly known as:  BACTROBAN Place 1 application into the nose 2 (two) times daily.   nitroGLYCERIN 0.2 mg/hr patch Commonly known as:  NITRODUR - Dosed in mg/24 hr Apply 1 patch to affected elbow, change daily   sodium chloride 0.65 % Soln nasal spray Commonly known as:  OCEAN Place 1 spray into both nostrils as needed for congestion.       Allergies:  Allergies  Allergen Reactions  . Contrast Media [Iodinated Diagnostic Agents] Swelling    Can take if premedicated with diphenhydramine  . Iodine Swelling    Past Medical History:  Diagnosis Date  . Colon polyps    adenomatous  . Diverticulosis 10/26/2013  . DM (diabetes mellitus), type 2 with neurological complications ([place]) 9/56/2130  .  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 History of chicken pox    childhood  . History of kidney stones   .  Hyperlipidemia 07/26/2014  . Hypertension   . Insomnia 02/25/2013  . Preventative health care 07/26/2014  . Renal lithiasis 02/21/2015  . Sciatica of right side 12/22/2012  . Seasonal allergies   . Squamous cell carcinoma in situ 2014   forehead, s/p excision by derm    Past Surgical History:  Procedure Laterality Date  . ABDOMINAL HERNIA REPAIR    . CHOLECYSTECTOMY    . HERNIA REPAIR    . LAPAROSCOPIC APPENDECTOMY N/A 08/16/2014   Procedure: APPENDECTOMY LAPAROSCOPIC;  Surgeon: Erroll Luna, MD;  Location: [facility];  Service: General;  Laterality: N/A;  . MASS EXCISION  05/22/2012   Procedure: EXCISION MASS;  Surgeon: Joyice Faster. Cornett, MD;  Location: [facility];  Service: General;  Laterality: Left;  excision abdominal wall mass  . ROTATOR CUFF REPAIR      Family History  Problem Relation Age of Onset  . Lung cancer Mother   . Diabetes Maternal Aunt   . Cancer Paternal Grandmother   . Breast cancer Neg Hx   . Colon cancer Neg Hx   . Prostate cancer Neg Hx   . Heart disease Neg Hx     Social History:  reports that he has never smoked. He has never used smokeless tobacco. He reports that he does not drink alcohol or use drugs.    Review of Systems    Lipid history: On long-term treatment with statin, Currently 40 mg Lipitor, Followed by PCP Has low HDL   Lab Results  Component Value Date   CHOL 105 10/03/2016   HDL 31.00 (L) 10/03/2016   LDLCALC 42 10/03/2016   LDLDIRECT 109.0 10/26/2014   TRIG 161.0 (H) 10/03/2016   CHOLHDL 3 10/03/2016           Hypertension: Blood pressure Is well-controlled On lisinopril   BP Readings from Last 3 Encounters:  01/11/17 118/76  01/01/17 109/74  12/07/16 132/82      Most recent eye exam was 1/17  Most recent foot exam: 2/17    Review of Systems    LABS:  Lab on  01/05/2017  Component Date Value Ref Range Status  . Sodium 01/05/2017 135  135 - 145 mEq/L Final  . Potassium 01/05/2017 4.5  3.5 - 5.1 mEq/L Final  . Chloride 01/05/2017 101  96 - 112 mEq/L Final  . CO2 01/05/2017 24  19 - 32 mEq/L Final  . Glucose, Bld 01/05/2017 112* 70 - 99 mg/dL Final  . BUN 01/05/2017 19  6 - 23 mg/dL Final  . Creatinine, Ser 01/05/2017 1.03  0.40 - 1.50 mg/dL Final  . Total Bilirubin 01/05/2017 0.7  0.2 - 1.2 mg/dL Final  . Alkaline Phosphatase 01/05/2017 75  39 - 117 U/L Final  . AST 01/05/2017 21  0 - 37 U/L Final  . ALT 01/05/2017 22  0 - 53 U/L Final  . Total Protein 01/05/2017 8.2  6.0 - 8.3 g/dL Final  . Albumin 01/05/2017 4.8  3.5 - 5.2 g/dL Final  . Calcium 01/05/2017 10.5  8.4 - 10.5 mg/dL Final  . GFR 01/05/2017 78.65  >60.00 mL/min Final  . Hgb A1c MFr Bld 01/05/2017 7.5* 4.6 - 6.5 % Final   Glycemic Control Guidelines for People with Diabetes:Non Diabetic:  <6%Goal of Therapy: <7%Additional Action Suggested:  >8%     Physical Examination:  BP 118/76   Pulse 93   Ht 6' (1.829 m)   Wt 181 lb 9.6 oz (82.4 kg)   SpO2 98%  BMI 24.63 kg/m     ASSESSMENT:  Diabetes type 2, uncontrolled with normal  BMI See history of present illness for detailed discussion of current diabetes management, blood sugar patterns and problems identified  His A1c is improved significantly with Victoza and he is not needing basal insulin However fasting readings are relatively high Metformin dose is limited by GI intolerance  HYPERTENSION: His blood pressure is controlled  PLAN:    He will increase Invokana up to 300 mg More readings after meals Follow-up in 3 months with another A1c He will call if blood sugars are not consistently controlled   Patient Instructions  Take 2 100mg  Invokana  Till gone  MORE sugars after meals        Kristiane Morsch 01/11/2017, 2:00 PM   Note: This office note was prepared with Estate agent.  Any transcriptional errors that result from this process are unintentional.

## 2017-01-11 NOTE — Patient Instructions (Signed)
Take 2 100mg  Invokana  Till gone  MORE sugars after meals

## 2017-01-12 MED FILL — ACCU-CHEK GUIDE TEST STRIP: 30 days supply | Qty: 50 | Fill #2

## 2017-02-28 ENCOUNTER — Encounter: Payer: Self-pay | Admitting: Family Medicine

## 2017-02-28 MED FILL — ACCU-CHEK GUIDE TEST STRIP: 30 days supply | Qty: 50 | Fill #3

## 2017-02-28 MED FILL — ACCU-CHEK FASTCLIX LANCETS: 30 days supply | Qty: 102 | Fill #1

## 2017-02-28 MED FILL — VICTOZA 18 MG/3 ML INJECT P: 18 | 30 days supply | Qty: 9 | Fill #2

## 2017-02-28 MED FILL — PENTIPS 31G X 5 MM MISC: 31G X 5 MM | 90 days supply | Qty: 100 | Fill #1

## 2017-03-01 ENCOUNTER — Ambulatory Visit (HOSPITAL_BASED_OUTPATIENT_CLINIC_OR_DEPARTMENT_OTHER)
Admission: RE | Admit: 2017-03-01 | Discharge: 2017-03-01 | Disposition: A | Discharge status: Home / Self Care | Payer: 59 | Source: Ambulatory Visit | Attending: Family Medicine | Admitting: Family Medicine

## 2017-03-01 ENCOUNTER — Other Ambulatory Visit: Payer: Self-pay | Admitting: Family Medicine

## 2017-03-01 DIAGNOSIS — M5418 Radiculopathy, sacral and sacrococcygeal region: Secondary | ICD-10-CM | POA: Insufficient documentation

## 2017-03-01 DIAGNOSIS — M533 Sacrococcygeal disorders, not elsewhere classified: Secondary | ICD-10-CM | POA: Diagnosis not present

## 2017-03-01 NOTE — Progress Notes (Signed)
Xray sacrum

## 2017-03-20 ENCOUNTER — Other Ambulatory Visit: Payer: Self-pay | Admitting: Family Medicine

## 2017-03-20 ENCOUNTER — Other Ambulatory Visit: Payer: Self-pay | Admitting: Endocrinology

## 2017-03-20 MED FILL — METFORMIN HCL ER 500 MG TAB: 500 | 90 days supply | Qty: 180 | Fill #0

## 2017-03-20 MED FILL — ATORVASTATIN 40 MG TABLET: 40 | 90 days supply | Qty: 90 | Fill #0

## 2017-03-27 ENCOUNTER — Encounter: Payer: Self-pay | Admitting: Endocrinology

## 2017-03-27 ENCOUNTER — Encounter: Payer: Self-pay | Admitting: Family Medicine

## 2017-03-29 MED FILL — LISINOPRIL 5 MG TAB: 5 | 90 days supply | Qty: 90 | Fill #2

## 2017-04-02 DIAGNOSIS — K8010 Calculus of gallbladder with chronic cholecystitis without obstruction: Secondary | ICD-10-CM | POA: Diagnosis not present

## 2017-04-06 MED FILL — INVOKANA 300 MG TABLET: 300 | 30 days supply | Qty: 30 | Fill #1

## 2017-04-06 MED FILL — VICTOZA 18 MG/3 ML INJECT P: 18 | 30 days supply | Qty: 9 | Fill #3

## 2017-04-09 ENCOUNTER — Other Ambulatory Visit (INDEPENDENT_AMBULATORY_CARE_PROVIDER_SITE_OTHER): Payer: 59

## 2017-04-09 DIAGNOSIS — E1165 Type 2 diabetes mellitus with hyperglycemia: Secondary | ICD-10-CM

## 2017-04-09 LAB — BASIC METABOLIC PANEL
BUN: 14 mg/dL (ref 6–23)
CO2: 27 meq/L (ref 19–32)
Calcium: 10 mg/dL (ref 8.4–10.5)
Chloride: 100 mEq/L (ref 96–112)
Creatinine, Ser: 0.93 mg/dL (ref 0.40–1.50)
GFR: 88.41 mL/min (ref >60.00)
GLUCOSE: 161 mg/dL — AB (ref 70–99)
POTASSIUM: 4.8 meq/L (ref 3.5–5.1)
SODIUM: 135 meq/L (ref 135–145)

## 2017-04-09 LAB — HEMOGLOBIN A1C: HEMOGLOBIN A1C: 7.6 % — AB (ref 4.6–6.5)

## 2017-04-12 ENCOUNTER — Encounter: Payer: Self-pay | Admitting: Endocrinology

## 2017-04-12 ENCOUNTER — Ambulatory Visit (INDEPENDENT_AMBULATORY_CARE_PROVIDER_SITE_OTHER): Payer: 59 | Admitting: Endocrinology

## 2017-04-12 VITALS — BP 100/72 | HR 100 | Ht 72.0 in | Wt 177.0 lb

## 2017-04-12 DIAGNOSIS — E1165 Type 2 diabetes mellitus with hyperglycemia: Secondary | ICD-10-CM

## 2017-04-12 DIAGNOSIS — Z794 Long term (current) use of insulin: Secondary | ICD-10-CM | POA: Diagnosis not present

## 2017-04-12 MED ORDER — GLIPIZIDE ER 2.5 MG PO TB24: 2.5000 mg | ORAL_TABLET | Freq: Every day | ORAL | 1 refills | Status: DC

## 2017-04-12 MED ORDER — CANAGLIFLOZIN 300 MG PO TABS: 300.0000 mg | ORAL_TABLET | Freq: Every day | ORAL | 3 refills | Status: DC

## 2017-04-12 MED FILL — glipiZIDE ER 2.5 MG TB24: 2.5 | 30 days supply | Qty: 30 | Fill #0

## 2017-04-12 NOTE — Patient Instructions (Signed)
Check blood sugars on waking up    Also check blood sugars about 2 hours after a meal and do this after different meals by rotation  Recommended blood sugar levels on waking up is 90-130 and about 2 hours after meal is 130-160  Please bring your blood sugar monitor to each visit, thank you  

## 2017-04-12 NOTE — Progress Notes (Signed)
[name], male   DOB: 1956/07/03, 61 y.o.   MRN: [id number]           Reason for Appointment:  Follow-up for Type 2 Diabetes   History of Present Illness:          Date of diagnosis of type 2 diabetes mellitus:        Background history:   He thinks his blood sugars at the time of diagnosis was around 500 and he was symptomatic with increased thirst and urination He has been taking metformin for several years but also has been tried on Amaryl Since about 2013 he has been on Januvia In early 2016 his blood sugars were significantly higher with A1c 11.9 and he was started on Lantus insulin, initially 10 units Janumet was continued Prior to consultation his A1c had gone up to 9%  Recent history:   Non-insulin hypoglycemic drugs the patient is taking are:  Invokana 300 mg daily  Victoza 1.8 mg daily  INSULIN regimen is: None  His A1c has stayed about the same at 7.6, previously 7.5  Current management, blood sugar patterns and problems identified:  His metformin was stopped in the middle of August because of complaints of diarrhea  He appears to be getting relatively high readings more recently although he is checking more consistently after breakfast meals then fasting or after supper  His blood sugars otherwise have been fairly good but A1c is higher than expected for his home average of 142  He did have an increase in his Invokana on his last visit along with continuing 1.8 mg Victoza  Diet can be variable, today had only grits for breakfast and an orange and does not always get protein in the morning        Side effects from medications have been: Diarrhea from high dose metformin  Compliance with the medical regimen: Improving   Glucose monitoring:  done 1-2 times a day         Glucometer:  Accu-Chek Blood Glucose readings by download  Mean values apply above for all meters except median for One Touch  PRE-MEAL Fasting Lunch Dinner Bedtime Overall    Glucose range: 118-140   89     Mean/median:     142   POST-MEAL PC Breakfast PC Lunch PC Dinner  Glucose range: 128-186  123-198    Mean/median:      Self-care: The diet that the patient has been following is: tries to limit high-fat foods, carbohydrates .     Meal times are:  Breakfast is at 6 AM, Lunch: 12-1 PM Dinner: 6-7 PM   Typical meal intake: Breakfast is usually  oatmeal, usually taking lunch from home to work               Dietician visit, most recent: 11/2015               Exercise:  Running on weekends or At Gym Up to 45 min during the day; Cardio and weights  Weight history:   Wt Readings from Last 3 Encounters:  04/12/17 177 lb (80.3 kg)  01/11/17 181 lb 9.6 oz (82.4 kg)  01/01/17 179 lb 9.6 oz (81.5 kg)    Glycemic control:   Lab Results  Component Value Date   HGBA1C 7.6 (H) 04/09/2017   HGBA1C 7.5 (H) 01/05/2017   HGBA1C 9.1 (H) 08/28/2016   Lab Results  Component Value Date   MICROALBUR <0.7 08/28/2016   [place] 42  10/03/2016   CREATININE 0.93 04/09/2017    Lab on 04/09/2017  Component Date Value Ref Range Status  . Hgb A1c MFr Bld 04/09/2017 7.6* 4.6 - 6.5 % Final   Glycemic Control Guidelines for People with Diabetes:Non Diabetic:  <6%Goal of Therapy: <7%Additional Action Suggested:  >8%   . Sodium 04/09/2017 135  135 - 145 mEq/L Final  . Potassium 04/09/2017 4.8  3.5 - 5.1 mEq/L Final  . Chloride 04/09/2017 100  96 - 112 mEq/L Final  . CO2 04/09/2017 27  19 - 32 mEq/L Final  . Glucose, Bld 04/09/2017 161* 70 - 99 mg/dL Final  . BUN 04/09/2017 14  6 - 23 mg/dL Final  . Creatinine, Ser 04/09/2017 0.93  0.40 - 1.50 mg/dL Final  . Calcium 04/09/2017 10.0  8.4 - 10.5 mg/dL Final  . GFR 04/09/2017 88.41  >60.00 mL/min Final       Allergies as of 04/12/2017      Reactions   Contrast Media [iodinated Diagnostic Agents] Swelling   Can take if premedicated with diphenhydramine   Iodine Swelling      Medication List       Accurate as of  04/12/17 11:59 PM. Always use your most recent med list.          aspirin EC 81 MG tablet Take 1 tablet (81 mg total) by mouth daily.   atorvastatin 40 MG tablet Commonly known as:  LIPITOR TAKE 1 TABLET BY MOUTH DAILY   canagliflozin 300 MG Tabs tablet Commonly known as:  INVOKANA Take 1 tablet (300 mg total) by mouth daily before breakfast.   glipiZIDE 2.5 MG 24 hr tablet Commonly known as:  GLUCOTROL XL Take 1 tablet (2.5 mg total) by mouth daily with breakfast.   glucose blood test strip Commonly known as:  ACCU-CHEK GUIDE Use to test blood sugar once daily Dx code E11.49   liraglutide 18 MG/3ML Sopn Commonly known as:  VICTOZA Inject 0.3 mLs (1.8 mg total) into the skin daily.   lisinopril 5 MG tablet Commonly known as:  PRINIVIL,ZESTRIL Take 1 tablet (5 mg total) by mouth daily.   loperamide 2 MG tablet Commonly known as:  IMODIUM A-D Take 2 mg by mouth 4 (four) times daily as needed for diarrhea or loose stools.   metFORMIN 500 MG 24 hr tablet Commonly known as:  GLUCOPHAGE-XR TAKE 2 TABLETS (1,000 MG TOTAL) BY MOUTH DAILY WITH BREAKFAST.   multivitamin with minerals tablet Take 1 tablet by mouth daily.   mupirocin ointment 2 % Commonly known as:  BACTROBAN Place 1 application into the nose 2 (two) times daily.   nitroGLYCERIN 0.2 mg/hr patch Commonly known as:  NITRODUR - Dosed in mg/24 hr Apply 1 patch to affected elbow, change daily   sodium chloride 0.65 % Soln nasal spray Commonly known as:  OCEAN Place 1 spray into both nostrils as needed for congestion.            Discharge Care Instructions        Start     Ordered   04/12/17 0000  glipiZIDE (GLUCOTROL XL) 2.5 MG 24 hr tablet  Daily with breakfast     04/12/17 1412   04/12/17 0000  canagliflozin (INVOKANA) 300 MG TABS tablet  Daily before breakfast     04/12/17 1412      Allergies:  Allergies  Allergen Reactions  . Contrast Media [Iodinated Diagnostic Agents] Swelling    Can  take if premedicated with diphenhydramine  . Iodine  Swelling    Past Medical History:  Diagnosis Date  . Colon polyps    adenomatous  . Diverticulosis 10/26/2013  . DM (diabetes mellitus), type 2 with neurological complications ([place]) 5/80/9983  . 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 History of chicken pox    childhood  . History of kidney stones   . Hyperlipidemia 07/26/2014  . Hypertension   . Insomnia 02/25/2013  . Preventative health care 07/26/2014  . Renal lithiasis 02/21/2015  . Sciatica of right side 12/22/2012  . Seasonal allergies   . Squamous cell carcinoma in situ 2014   forehead, s/p excision by derm    Past Surgical History:  Procedure Laterality Date  . ABDOMINAL HERNIA REPAIR    . CHOLECYSTECTOMY    . HERNIA REPAIR    . LAPAROSCOPIC APPENDECTOMY N/A 08/16/2014   Procedure: APPENDECTOMY LAPAROSCOPIC;  Surgeon: Erroll Luna, MD;  Location: [facility];  Service: General;  Laterality: N/A;  . MASS EXCISION  05/22/2012   Procedure: EXCISION MASS;  Surgeon: Joyice Faster. Cornett, MD;  Location: [facility];  Service: General;  Laterality: Left;  excision abdominal wall mass  . ROTATOR CUFF REPAIR      Family History  Problem Relation Age of Onset  . Lung cancer Mother   . Diabetes Maternal Aunt   . Cancer Paternal Grandmother   . Breast cancer Neg Hx   . Colon cancer Neg Hx   . Prostate cancer Neg Hx   . Heart disease Neg Hx     Social History:  reports that he has never smoked. He has never used smokeless tobacco. He reports that he does not drink alcohol or use drugs.    Review of Systems    Lipid history: On long-term treatment with statin, Currently 40 mg Lipitor, Followed by PCP Has low HDL   Lab Results  Component Value Date   CHOL 105 10/03/2016   HDL 31.00 (L) 10/03/2016   LDLCALC 42 10/03/2016   LDLDIRECT 109.0 10/26/2014   TRIG 161.0 (H) 10/03/2016   CHOLHDL 3 10/03/2016           Hypertension: Blood  pressure Is well-controlled On lisinopril, Only 5 mg.  Also on Invokana   BP Readings from Last 3 Encounters:  04/12/17 100/72  01/11/17 118/76  01/01/17 109/74     Most recent eye exam was 1/17  Most recent foot exam: 2/17    Review of Systems    LABS:  Lab on 04/09/2017  Component Date Value Ref Range Status  . Hgb A1c MFr Bld 04/09/2017 7.6* 4.6 - 6.5 % Final   Glycemic Control Guidelines for People with Diabetes:Non Diabetic:  <6%Goal of Therapy: <7%Additional Action Suggested:  >8%   . Sodium 04/09/2017 135  135 - 145 mEq/L Final  . Potassium 04/09/2017 4.8  3.5 - 5.1 mEq/L Final  . Chloride 04/09/2017 100  96 - 112 mEq/L Final  . CO2 04/09/2017 27  19 - 32 mEq/L Final  . Glucose, Bld 04/09/2017 161* 70 - 99 mg/dL Final  . BUN 04/09/2017 14  6 - 23 mg/dL Final  . Creatinine, Ser 04/09/2017 0.93  0.40 - 1.50 mg/dL Final  . Calcium 04/09/2017 10.0  8.4 - 10.5 mg/dL Final  . GFR 04/09/2017 88.41  >60.00 mL/min Final    Physical Examination:  BP 100/72   Pulse 100   Ht 6' (1.829 m)   Wt 177 lb (80.3 kg)   SpO2 97%   BMI 24.01 kg/m  ASSESSMENT:  Diabetes type 2, uncontrolled with normal  BMI See history of present illness for detailed discussion of current diabetes management, blood sugar patterns and problems identified  His A1c is still not at target and over 7% Also with stopping metformin because of diarrhea his fasting and morning readings appear to be relatively higher recently Not clear if he is still having high readings after supper as he does not monitor her dose times Not clear if he has improved control with increasing his Invokana on the last visit also   PLAN:    He will try to check blood sugar more consistently at various times including after supper and occasionally fasting also Trial of glipizide ER 2.5 mg daily instead of adding basal insulin at this time If he has mostly high readings after supper make changes to  glimepiride Reminded him to add a protein with breakfast everyday He will call if blood sugars are not consistently controlled Review blood sugars in 6 weeks again  Patient Instructions  Check blood sugars on waking up    Also check blood sugars about 2 hours after a meal and do this after different meals by rotation  Recommended blood sugar levels on waking up is 90-130 and about 2 hours after meal is 130-160  Please bring your blood sugar monitor to each visit, thank you        [facility] 04/13/2017, 11:48 AM   Note: This office note was prepared with Dragon voice recognition system technology. Any transcriptional errors that result from this process are unintentional.

## 2017-04-27 ENCOUNTER — Other Ambulatory Visit (INDEPENDENT_AMBULATORY_CARE_PROVIDER_SITE_OTHER): Payer: 59

## 2017-04-27 DIAGNOSIS — E782 Mixed hyperlipidemia: Secondary | ICD-10-CM

## 2017-04-27 DIAGNOSIS — I10 Essential (primary) hypertension: Secondary | ICD-10-CM | POA: Diagnosis not present

## 2017-04-27 DIAGNOSIS — N400 Enlarged prostate without lower urinary tract symptoms: Secondary | ICD-10-CM

## 2017-04-27 LAB — COMPREHENSIVE METABOLIC PANEL
ALBUMIN: 4.2 g/dL (ref 3.5–5.2)
ALK PHOS: 70 U/L (ref 39–117)
ALT: 23 U/L (ref 0–53)
AST: 18 U/L (ref 0–37)
BUN: 15 mg/dL (ref 6–23)
CO2: 25 mEq/L (ref 19–32)
Calcium: 9.6 mg/dL (ref 8.4–10.5)
Chloride: 104 mEq/L (ref 96–112)
Creatinine, Ser: 0.82 mg/dL (ref 0.40–1.50)
GFR: 102.22 mL/min (ref >60.00)
Glucose, Bld: 140 mg/dL — ABNORMAL HIGH (ref 70–99)
POTASSIUM: 4.3 meq/L (ref 3.5–5.1)
Sodium: 139 mEq/L (ref 135–145)
TOTAL PROTEIN: 7 g/dL (ref 6.0–8.3)
Total Bilirubin: 0.5 mg/dL (ref 0.2–1.2)

## 2017-04-27 LAB — TSH: TSH: 1.42 u[IU]/mL (ref 0.35–4.50)

## 2017-04-27 LAB — PSA: PSA: 0.86 ng/mL (ref 0.10–4.00)

## 2017-04-27 LAB — CBC
HCT: 43.4 % (ref 39.0–52.0)
HEMOGLOBIN: 14.4 g/dL (ref 13.0–17.0)
MCHC: 33.1 g/dL (ref 30.0–36.0)
MCV: 91.4 fl (ref 78.0–100.0)
PLATELETS: 205 10*3/uL (ref 150.0–400.0)
RBC: 4.75 Mil/uL (ref 4.22–5.81)
RDW: 14.1 % (ref 11.5–15.5)
WBC: 6.5 10*3/uL (ref 4.0–10.5)

## 2017-04-27 LAB — LIPID PANEL
CHOL/HDL RATIO: 3
Cholesterol: 115 mg/dL (ref 0–200)
HDL: 45.7 mg/dL (ref >39.00)
LDL Cholesterol: 52 mg/dL (ref 0–99)
NONHDL: 69.76
Triglycerides: 91 mg/dL (ref 0.0–149.0)
VLDL: 18.2 mg/dL (ref 0.0–40.0)

## 2017-05-02 ENCOUNTER — Other Ambulatory Visit: Payer: Self-pay | Admitting: Endocrinology

## 2017-05-02 MED FILL — VICTOZA 18 MG/3 ML INJECT P: 18 | 30 days supply | Qty: 9 | Fill #0

## 2017-05-02 MED FILL — ACCU-CHEK GUIDE TEST STRIP: 30 days supply | Qty: 50 | Fill #4

## 2017-05-03 ENCOUNTER — Encounter: Payer: Self-pay | Admitting: Family Medicine

## 2017-05-03 ENCOUNTER — Ambulatory Visit (INDEPENDENT_AMBULATORY_CARE_PROVIDER_SITE_OTHER): Payer: 59 | Admitting: Family Medicine

## 2017-05-03 VITALS — BP 119/78 | HR 86 | Temp 98.2°F | Resp 18 | Ht 72.0 in | Wt 179.8 lb

## 2017-05-03 DIAGNOSIS — M5416 Radiculopathy, lumbar region: Secondary | ICD-10-CM | POA: Diagnosis not present

## 2017-05-03 DIAGNOSIS — G4700 Insomnia, unspecified: Secondary | ICD-10-CM

## 2017-05-03 DIAGNOSIS — I10 Essential (primary) hypertension: Secondary | ICD-10-CM

## 2017-05-03 DIAGNOSIS — E1149 Type 2 diabetes mellitus with other diabetic neurological complication: Secondary | ICD-10-CM

## 2017-05-03 DIAGNOSIS — M6530 Trigger finger, unspecified finger: Secondary | ICD-10-CM

## 2017-05-03 DIAGNOSIS — E785 Hyperlipidemia, unspecified: Secondary | ICD-10-CM

## 2017-05-03 DIAGNOSIS — Z0000 Encounter for general adult medical examination without abnormal findings: Secondary | ICD-10-CM | POA: Diagnosis not present

## 2017-05-03 HISTORY — DX: Trigger finger, unspecified finger: M65.30

## 2017-05-03 MED ORDER — TURMERIC CURCUMIN 500 MG PO CAPS: 1.0000 | ORAL_CAPSULE | Freq: Every day | ORAL | Status: DC

## 2017-05-03 NOTE — Assessment & Plan Note (Addendum)
Patient encouraged to maintain heart healthy diet, regular exercise, adequate sleep. Consider daily probiotics. Take medications as prescribed. Labs reviewed with patient, colonoscopy is UTD. Agrees to bring copy of his Advanced Directives.

## 2017-05-03 NOTE — Assessment & Plan Note (Addendum)
Encouraged good sleep hygiene such as dark, quiet room. No blue/green glowing lights such as computer screens in bedroom. No alcohol or stimulants in evening. Cut down on caffeine as able. Regular exercise is helpful but not just prior to bed time. Try Melatonin 

## 2017-05-03 NOTE — Assessment & Plan Note (Signed)
hgba1c acceptable, minimize simple carbs. Increase exercise as tolerated. Continue current meds. Follows with endocrinology 

## 2017-05-03 NOTE — Progress Notes (Signed)
Subjective:  I acted as a Education administrator for Dr. Charlett Blake. [place], [place]  [name], male    DOB: Oct 06, 1955, 61 y.o.   MRN: [id number]  No chief complaint on file.   HPI  Patient is in today for an annual exam and follow up on chronic medical concerns including hyperlipidemia, chronic pain, diabetes and hypertension. Has started following with endocrinology and his sugars have been well controlled. No polyuria or polydipsia. Denies CP/palp/SOB/HA/congestion/fevers/GI or GU c/o. Taking meds as prescribed  Patient Care Team: Mosie Lukes, MD as PCP - General (Family Medicine)   Past Medical History:  Diagnosis Date  . Colon polyps    adenomatous  . Diverticulosis 10/26/2013  . DM (diabetes mellitus), type 2 with neurological complications ([place]) 6/38/7564  . 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 History of chicken pox    childhood  . History of kidney stones   . Hyperlipidemia 07/26/2014  . Hypertension   . Insomnia 02/25/2013  . Preventative health care 07/26/2014  . Renal lithiasis 02/21/2015  . Sciatica of right side 12/22/2012  . Seasonal allergies   . 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 Trigger finger 05/03/2017    Past Surgical History:  Procedure Laterality Date  . ABDOMINAL HERNIA REPAIR    . CHOLECYSTECTOMY    . HERNIA REPAIR    . LAPAROSCOPIC APPENDECTOMY N/A 08/16/2014   Procedure: APPENDECTOMY LAPAROSCOPIC;  Surgeon: Erroll Luna, MD;  Location: [facility];  Service: General;  Laterality: N/A;  . MASS EXCISION  05/22/2012   Procedure: EXCISION MASS;  Surgeon: Joyice Faster. Cornett, MD;  Location: [facility];  Service: General;  Laterality: Left;  excision abdominal wall mass  . ROTATOR CUFF REPAIR      Family History  Problem Relation Age of Onset  . Lung cancer Mother   . Diabetes Maternal Aunt   . Cancer Paternal Grandmother   . Breast cancer Neg Hx   . Colon cancer Neg Hx   .  Prostate cancer Neg Hx   . Heart disease Neg Hx     Social History   Social History  . Marital status: Married    Spouse name: N/A  . Number of children: 3  . Years of education: N/A   Occupational History  .      Security officer [employer]   Social History Main Topics  . Smoking status: Never Smoker  . Smokeless tobacco: Never Used  . Alcohol use No  . Drug use: No  . Sexual activity: Yes   Other Topics Concern  . Not on file   Social History Narrative  . No narrative on file    Outpatient Medications Prior to Visit  Medication Sig Dispense Refill  . aspirin EC 81 MG tablet Take 1 tablet (81 mg total) by mouth daily.    Marland Kitchen atorvastatin (LIPITOR) 40 MG tablet TAKE 1 TABLET BY MOUTH DAILY 90 tablet 0  . canagliflozin (INVOKANA) 300 MG TABS tablet Take 1 tablet (300 mg total) by mouth daily before breakfast. 30 tablet 3  . glipiZIDE (GLUCOTROL XL) 2.5 MG 24 hr tablet Take 1 tablet (2.5 mg total) by mouth daily with breakfast. 30 tablet 1  . glucose blood (ACCU-CHEK GUIDE) test strip Use to test blood sugar once daily Dx code E11.49 50 each 4  . lisinopril (PRINIVIL,ZESTRIL) 5 MG tablet Take 1 tablet (5 mg total) by mouth daily. 90 tablet 3  .  loperamide (IMODIUM A-D) 2 MG tablet Take 2 mg by mouth 4 (four) times daily as needed for diarrhea or loose stools.    . sodium chloride (OCEAN) 0.65 % SOLN nasal spray Place 1 spray into both nostrils as needed for congestion.    Marland Kitchen VICTOZA 18 MG/3ML SOPN INJECT 0.3 MLS (1.8 MG TOTAL) INTO THE SKIN DAILY. 9 mL 3  . metFORMIN (GLUCOPHAGE-XR) 500 MG 24 hr tablet TAKE 2 TABLETS (1,000 MG TOTAL) BY MOUTH DAILY WITH BREAKFAST. (Patient not taking: Reported on 04/12/2017) 60 tablet 2  . Multiple Vitamins-Minerals (MULTIVITAMIN WITH MINERALS) tablet Take 1 tablet by mouth daily.    . mupirocin ointment (BACTROBAN) 2 % Place 1 application into the nose 2 (two) times daily. (Patient not taking: Reported on 04/12/2017) 22 g 0  . nitroGLYCERIN (NITRODUR  - DOSED IN MG/24 HR) 0.2 mg/hr patch Apply 1 patch to affected elbow, change daily (Patient not taking: Reported on 04/12/2017) 30 patch 3   No facility-administered medications prior to visit.     Allergies  Allergen Reactions  . Contrast Media [Iodinated Diagnostic Agents] Swelling    Can take if premedicated with diphenhydramine  . Iodine Swelling    Review of Systems  Constitutional: Negative for chills, fever and malaise/fatigue.  HENT: Negative for congestion and hearing loss.   Eyes: Negative for discharge.  Respiratory: Negative for cough, sputum production and shortness of breath.   Cardiovascular: Negative for chest pain, palpitations and leg swelling.  Gastrointestinal: Negative for abdominal pain, blood in stool, constipation, diarrhea, heartburn, nausea and vomiting.  Genitourinary: Negative for dysuria, frequency, hematuria and urgency.  Musculoskeletal: Positive for back pain. Negative for falls and myalgias.  Skin: Negative for rash.  Neurological: Negative for dizziness, sensory change, loss of consciousness, weakness and headaches.  Endo/Heme/Allergies: Negative for environmental allergies. Does not bruise/bleed easily.  Psychiatric/Behavioral: Negative for depression and suicidal ideas. The patient is not nervous/anxious and does not have insomnia.        Objective:    Physical Exam  Constitutional: He is oriented to person, place, and time. He appears well-developed and well-nourished. No distress.  HENT:  Head: Normocephalic and atraumatic.  Eyes: Conjunctivae are normal.  Neck: Neck supple. No thyromegaly present.  Cardiovascular: Normal rate, regular rhythm and normal heart sounds.   No murmur heard. Pulmonary/Chest: Effort normal and breath sounds normal. No respiratory distress. He has no wheezes.  Abdominal: Soft. Bowel sounds are normal. He exhibits no mass. There is no tenderness.  Musculoskeletal: He exhibits no edema.  Lymphadenopathy:    He has  no cervical adenopathy.  Neurological: He is alert and oriented to person, place, and time.  Skin: Skin is warm and dry.  Psychiatric: He has a normal mood and affect. His behavior is normal.    BP 119/78 (BP Location: Left Arm, Patient Position: Sitting, Cuff Size: Normal)   Pulse 86   Temp 98.2 F (36.8 C) (Oral)   Resp 18   Ht 6' (1.829 m)   Wt 179 lb 12.8 oz (81.6 kg)   SpO2 100%   BMI 24.39 kg/m  Wt Readings from Last 3 Encounters:  05/03/17 179 lb 12.8 oz (81.6 kg)  04/12/17 177 lb (80.3 kg)  01/11/17 181 lb 9.6 oz (82.4 kg)   BP Readings from Last 3 Encounters:  05/03/17 119/78  04/12/17 100/72  01/11/17 118/76     Immunization History  Administered Date(s) Administered  . Influenza,inj,Quad PF,6+ Mos 06/02/2013  . Influenza-Unspecified 05/05/2014, 05/02/2015, 05/08/2016  .  Pneumococcal Conjugate-13 06/02/2013  . Pneumococcal Polysaccharide-23 09/07/2015  . Pneumococcal-Unspecified 08/16/2009  . Tdap 08/22/2010    Health Maintenance  Topic Date Due  . OPHTHALMOLOGY EXAM  09/02/2016  . FOOT EXAM  10/06/2016  . INFLUENZA VACCINE  03/14/2017  . HEMOGLOBIN A1C  10/10/2017  . COLONOSCOPY  10/23/2018  . TETANUS/TDAP  08/22/2020  . PNEUMOCOCCAL POLYSACCHARIDE VACCINE  Completed  . Hepatitis C Screening  Completed  . HIV Screening  Completed    Lab Results  Component Value Date   WBC 6.5 04/27/2017   HGB 14.4 04/27/2017   HCT 43.4 04/27/2017   PLT 205.0 04/27/2017   GLUCOSE 140 (H) 04/27/2017   CHOL 115 04/27/2017   TRIG 91.0 04/27/2017   HDL 45.70 04/27/2017   LDLDIRECT 109.0 10/26/2014   LDLCALC 52 04/27/2017   ALT 23 04/27/2017   AST 18 04/27/2017   NA 139 04/27/2017   K 4.3 04/27/2017   CL 104 04/27/2017   CREATININE 0.82 04/27/2017   BUN 15 04/27/2017   CO2 25 04/27/2017   TSH 1.42 04/27/2017   PSA 0.86 04/27/2017   INR 1.10 08/16/2014   HGBA1C 7.6 (H) 04/09/2017   MICROALBUR <0.7 08/28/2016    Lab Results  Component Value Date   TSH  1.42 04/27/2017   Lab Results  Component Value Date   WBC 6.5 04/27/2017   HGB 14.4 04/27/2017   HCT 43.4 04/27/2017   MCV 91.4 04/27/2017   PLT 205.0 04/27/2017   Lab Results  Component Value Date   NA 139 04/27/2017   K 4.3 04/27/2017   CO2 25 04/27/2017   GLUCOSE 140 (H) 04/27/2017   BUN 15 04/27/2017   CREATININE 0.82 04/27/2017   BILITOT 0.5 04/27/2017   ALKPHOS 70 04/27/2017   AST 18 04/27/2017   ALT 23 04/27/2017   PROT 7.0 04/27/2017   ALBUMIN 4.2 04/27/2017   CALCIUM 9.6 04/27/2017   ANIONGAP 11 01/31/2015   GFR 102.22 04/27/2017   Lab Results  Component Value Date   CHOL 115 04/27/2017   Lab Results  Component Value Date   HDL 45.70 04/27/2017   Lab Results  Component Value Date   LDLCALC 52 04/27/2017   Lab Results  Component Value Date   TRIG 91.0 04/27/2017   Lab Results  Component Value Date   CHOLHDL 3 04/27/2017   Lab Results  Component Value Date   HGBA1C 7.6 (H) 04/09/2017         Assessment & Plan:   Problem List Items Addressed This Visit    DM (diabetes mellitus), type 2 with neurological complications ([place])    [id number] acceptable, minimize simple carbs. Increase exercise as tolerated. Continue current meds. Follows with endocrinology      Lumbar radiculopathy    Encouraged moist heat and gentle stretching as tolerated. May try NSAIDs and prescription meds as directed and report if symptoms worsen or seek immediate care      HTN (hypertension)    Well controlled, no changes to meds. Encouraged heart healthy diet such as the DASH diet and exercise as tolerated.       Relevant Orders   CBC with Differential/Platelet   Comprehensive metabolic panel   TSH   Insomnia    Encouraged good sleep hygiene such as dark, quiet room. No blue/green glowing lights such as computer screens in bedroom. No alcohol or stimulants in evening. Cut down on caffeine as able. Regular exercise is helpful but not just prior to bed time. Try  Melatonin      Preventative health care    Patient encouraged to maintain heart healthy diet, regular exercise, adequate sleep. Consider daily probiotics. Take medications as prescribed. Labs reviewed with patient, colonoscopy is UTD. Agrees to bring copy of his Advanced Directives.      Trigger finger    Left hand and is planning to see sports med.        Other Visit Diagnoses    Hyperlipidemia, unspecified hyperlipidemia type    -  Primary   Relevant Orders   Lipid panel      I have discontinued Mr. Mccamy multivitamin with minerals, mupirocin ointment, nitroGLYCERIN, and metFORMIN. I am also having him start on Turmeric Curcumin. Additionally, I am having him maintain his loperamide, aspirin EC, sodium chloride, lisinopril, glucose blood, atorvastatin, glipiZIDE, canagliflozin, and VICTOZA.  Meds ordered this encounter  Medications  . Turmeric Curcumin 500 MG CAPS    Sig: Take 1 capsule by mouth daily.    CMA served as Education administrator during this visit. History, Physical and Plan performed by medical provider. Documentation and orders reviewed and attested to.  [name], MD

## 2017-05-03 NOTE — Assessment & Plan Note (Signed)
Left hand and is planning to see sports med.

## 2017-05-03 NOTE — Assessment & Plan Note (Signed)
Well controlled, no changes to meds. Encouraged heart healthy diet such as the DASH diet and exercise as tolerated.  °

## 2017-05-03 NOTE — Patient Instructions (Addendum)
Shingrix is the new shingles shot, 2 shots over 2-6 months. Melatonin 2-10 mg for sleep  Preventive Care 40-64 Years, Male Preventive care refers to lifestyle choices and visits with your health care provider that can promote health and wellness. What does preventive care include?  A yearly physical exam. This is also called an annual well check.  Dental exams once or twice a year.  Routine eye exams. Ask your health care provider how often you should have your eyes checked.  Personal lifestyle choices, including: ? Daily care of your teeth and gums. ? Regular physical activity. ? Eating a healthy diet. ? Avoiding tobacco and drug use. ? Limiting alcohol use. ? Practicing safe sex. ? Taking low-dose aspirin every day starting at age 5. What happens during an annual well check? The services and screenings done by your health care provider during your annual well check will depend on your age, overall health, lifestyle risk factors, and family history of disease. Counseling Your health care provider may ask you questions about your:  Alcohol use.  Tobacco use.  Drug use.  Emotional well-being.  Home and relationship well-being.  Sexual activity.  Eating habits.  Work and work Statistician.  Screening You may have the following tests or measurements:  Height, weight, and BMI.  Blood pressure.  Lipid and cholesterol levels. These may be checked every 5 years, or more frequently if you are over 68 years old.  Skin check.  Lung cancer screening. You may have this screening every year starting at age 68 if you have a 30-pack-year history of smoking and currently smoke or have quit within the past 15 years.  Fecal occult blood test (FOBT) of the stool. You may have this test every year starting at age 13.  Flexible sigmoidoscopy or colonoscopy. You may have a sigmoidoscopy every 5 years or a colonoscopy every 10 years starting at age 66.  Prostate cancer screening.  Recommendations will vary depending on your family history and other risks.  Hepatitis C blood test.  Hepatitis B blood test.  Sexually transmitted disease (STD) testing.  Diabetes screening. This is done by checking your blood sugar (glucose) after you have not eaten for a while (fasting). You may have this done every 1-3 years.  Discuss your test results, treatment options, and if necessary, the need for more tests with your health care provider. Vaccines Your health care provider may recommend certain vaccines, such as:  Influenza vaccine. This is recommended every year.  Tetanus, diphtheria, and acellular pertussis (Tdap, Td) vaccine. You may need a Td booster every 10 years.  Varicella vaccine. You may need this if you have not been vaccinated.  Zoster vaccine. You may need this after age 34.  Measles, mumps, and rubella (MMR) vaccine. You may need at least one dose of MMR if you were born in 1957 or later. You may also need a second dose.  Pneumococcal 13-valent conjugate (PCV13) vaccine. You may need this if you have certain conditions and have not been vaccinated.  Pneumococcal polysaccharide (PPSV23) vaccine. You may need one or two doses if you smoke cigarettes or if you have certain conditions.  Meningococcal vaccine. You may need this if you have certain conditions.  Hepatitis A vaccine. You may need this if you have certain conditions or if you travel or work in places where you may be exposed to hepatitis A.  Hepatitis B vaccine. You may need this if you have certain conditions or if you travel or work  in places where you may be exposed to hepatitis B.  Haemophilus influenzae type b (Hib) vaccine. You may need this if you have certain risk factors.  Talk to your health care provider about which screenings and vaccines you need and how often you need them. This information is not intended to replace advice given to you by your health care provider. Make sure you  discuss any questions you have with your health care provider. Document Released: 08/27/2015 Document Revised: 04/19/2016 Document Reviewed: 06/01/2015 Elsevier Interactive Patient Education  2017 [employer].

## 2017-05-06 NOTE — Assessment & Plan Note (Signed)
Encouraged moist heat and gentle stretching as tolerated. May try NSAIDs and prescription meds as directed and report if symptoms worsen or seek immediate care 

## 2017-05-07 ENCOUNTER — Encounter: Payer: Self-pay | Admitting: Family Medicine

## 2017-05-08 MED FILL — glipiZIDE ER 2.5 MG TB24: 2.5 | 30 days supply | Qty: 30 | Fill #1

## 2017-05-08 MED FILL — INVOKANA 300 MG TABLET: 300 | 30 days supply | Qty: 30 | Fill #0

## 2017-05-09 ENCOUNTER — Ambulatory Visit (INDEPENDENT_AMBULATORY_CARE_PROVIDER_SITE_OTHER): Payer: 59 | Admitting: Family Medicine

## 2017-05-09 ENCOUNTER — Encounter: Payer: Self-pay | Admitting: Family Medicine

## 2017-05-09 VITALS — BP 101/70 | HR 97 | Temp 98.3°F | Ht 71.5 in | Wt 179.8 lb

## 2017-05-09 DIAGNOSIS — R509 Fever, unspecified: Secondary | ICD-10-CM

## 2017-05-09 DIAGNOSIS — R5381 Other malaise: Secondary | ICD-10-CM | POA: Diagnosis not present

## 2017-05-09 LAB — COMPREHENSIVE METABOLIC PANEL
ALBUMIN: 4.9 g/dL (ref 3.5–5.2)
ALK PHOS: 74 U/L (ref 39–117)
ALT: 30 U/L (ref 0–53)
AST: 24 U/L (ref 0–37)
BUN: 15 mg/dL (ref 6–23)
CALCIUM: 10.6 mg/dL — AB (ref 8.4–10.5)
CHLORIDE: 100 meq/L (ref 96–112)
CO2: 27 meq/L (ref 19–32)
CREATININE: 0.86 mg/dL (ref 0.40–1.50)
GFR: 96.74 mL/min (ref >60.00)
Glucose, Bld: 103 mg/dL — ABNORMAL HIGH (ref 70–99)
POTASSIUM: 4.9 meq/L (ref 3.5–5.1)
SODIUM: 135 meq/L (ref 135–145)
Total Bilirubin: 0.6 mg/dL (ref 0.2–1.2)
Total Protein: 8.3 g/dL (ref 6.0–8.3)

## 2017-05-09 LAB — CBC
HEMATOCRIT: 50.3 % (ref 39.0–52.0)
Hemoglobin: 16.7 g/dL (ref 13.0–17.0)
MCHC: 33.1 g/dL (ref 30.0–36.0)
MCV: 92.2 fl (ref 78.0–100.0)
PLATELETS: 250 10*3/uL (ref 150.0–400.0)
RBC: 5.46 Mil/uL (ref 4.22–5.81)
RDW: 14.2 % (ref 11.5–15.5)
WBC: 7.4 10*3/uL (ref 4.0–10.5)

## 2017-05-09 LAB — POCT RAPID STREP A (OFFICE): RAPID STREP A SCREEN: NEGATIVE

## 2017-05-09 LAB — POCT INFLUENZA A/B
INFLUENZA A, POC: NEGATIVE
Influenza B, POC: NEGATIVE

## 2017-05-09 MED ORDER — DOXYCYCLINE HYCLATE 100 MG PO CAPS: 100.0000 mg | ORAL_CAPSULE | Freq: Two times a day (BID) | ORAL | 0 refills | Status: DC

## 2017-05-09 MED FILL — DOXYCYCLINE HYCLATE 100 MG: 100 | 10 days supply | Qty: 20 | Fill #0

## 2017-05-09 NOTE — Patient Instructions (Addendum)
Your flu and strep tests are negative today We will check some blood work for you today- we are also going to treat you with doxycycline to make sure we cover for possible tick borne illness Rest, plenty of fluids, and use ibuprofen/ tylenol as needed  Please do let me know if you are getting better in the next few days- Sooner if worse.

## 2017-05-09 NOTE — Progress Notes (Signed)
error 

## 2017-05-09 NOTE — Addendum Note (Signed)
Addended by: Lamar Blinks C on: 05/09/2017 09:09 PM   Modules accepted: Orders

## 2017-05-09 NOTE — Progress Notes (Addendum)
[facility] [address], [place], [place] [address] [phone]  Date:  05/09/2017   Name:  Albert [name]   DOB:  1955-08-22   MRN:  [id number]  PCP:  Mosie Lukes, MD    Chief Complaint: Chills (Had flu shot Monday)   History of Present Illness:  Albert [name] is a 61 y.o. very pleasant male patient who presents with the following:  Pt of Dr. Charlett Blake, here today with concern of chills and malaise for the last 2 days He did have his flu shot 2 days ago at work Yesterday am he noted that he felt lightheaded, weak.  He did have a cough, mild ST. He took therflu last night No urinary symptoms- no dysuria or frequency, no hematuria  No nausea or vomiting No rash  I have seen this pt a couple of times in the past for acute issues  He did have a strong reaction to a flu shot about 5 years ago but otherwise has tolerated it fine  Today is wed- he did have a temp Monday night, to 100.2  He has not been exposed to illness as far as he knows He lives with his wife at home   He does spend time outdoors but is not aware of pulling off any ticks this season   Patient Active Problem List   Diagnosis Date Noted  . Trigger finger 05/03/2017  . Elbow pain, chronic, right 08/13/2016  . Tendinitis of finger of left hand 07/31/2016  . Tendinitis of right triceps 07/31/2016  . 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 Atypical chest pain 11/01/2014  . Hyperlipidemia, mixed 07/26/2014  . 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 Preventative health care 07/26/2014  . BPH (benign prostatic hyperplasia) 12/11/2013  . Diverticulosis 10/26/2013  . Acute neck pain 06/04/2013  . Insomnia 02/25/2013  . Sciatica of right side 12/22/2012  . HTN (hypertension) 11/22/2012  . DM (diabetes mellitus), type 2 with neurological complications ([place]) 50/04/3817  . Lumbar radiculopathy 02/09/2012     Past Medical History:  Diagnosis Date  . Colon polyps    adenomatous  . Diverticulosis 10/26/2013  . DM (diabetes mellitus), type 2 with neurological complications ([place]) 2/99/3716  . 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 History of chicken pox    childhood  . History of kidney stones   . Hyperlipidemia 07/26/2014  . Hypertension   . Insomnia 02/25/2013  . Preventative health care 07/26/2014  . Renal lithiasis 02/21/2015  . Sciatica of right side 12/22/2012  . Seasonal allergies   . 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 Trigger finger 05/03/2017    Past Surgical History:  Procedure Laterality Date  . ABDOMINAL HERNIA REPAIR    . CHOLECYSTECTOMY    . HERNIA REPAIR    . LAPAROSCOPIC APPENDECTOMY N/A 08/16/2014   Procedure: APPENDECTOMY LAPAROSCOPIC;  Surgeon: Erroll Luna, MD;  Location: [facility];  Service: General;  Laterality: N/A;  . MASS EXCISION  05/22/2012   Procedure: EXCISION MASS;  Surgeon: Joyice Faster. Cornett, MD;  Location: [facility];  Service: General;  Laterality: Left;  excision abdominal wall mass  . ROTATOR CUFF REPAIR      Social History  Substance Use Topics  . Smoking status: Never Smoker  . Smokeless tobacco: Never Used  . Alcohol use No    Family  History  Problem Relation Age of Onset  . Lung cancer Mother   . Diabetes Maternal Aunt   . Cancer Paternal Grandmother   . Breast cancer Neg Hx   . Colon cancer Neg Hx   . Prostate cancer Neg Hx   . Heart disease Neg Hx     Allergies  Allergen Reactions  . Contrast Media [Iodinated Diagnostic Agents] Swelling    Can take if premedicated with diphenhydramine  . Iodine Swelling    Medication list has been reviewed and updated.  Current Outpatient Prescriptions on File Prior to Visit  Medication Sig Dispense Refill  . aspirin EC 81 MG tablet Take 1 tablet (81 mg total) by mouth daily.    Marland Kitchen atorvastatin (LIPITOR) 40 MG tablet  TAKE 1 TABLET BY MOUTH DAILY 90 tablet 0  . canagliflozin (INVOKANA) 300 MG TABS tablet Take 1 tablet (300 mg total) by mouth daily before breakfast. 30 tablet 3  . glipiZIDE (GLUCOTROL XL) 2.5 MG 24 hr tablet Take 1 tablet (2.5 mg total) by mouth daily with breakfast. 30 tablet 1  . glucose blood (ACCU-CHEK GUIDE) test strip Use to test blood sugar once daily Dx code E11.49 50 each 4  . lisinopril (PRINIVIL,ZESTRIL) 5 MG tablet Take 1 tablet (5 mg total) by mouth daily. 90 tablet 3  . loperamide (IMODIUM A-D) 2 MG tablet Take 2 mg by mouth 4 (four) times daily as needed for diarrhea or loose stools.    . sodium chloride (OCEAN) 0.65 % SOLN nasal spray Place 1 spray into both nostrils as needed for congestion.    . Turmeric Curcumin 500 MG CAPS Take 1 capsule by mouth daily.    Marland Kitchen VICTOZA 18 MG/3ML SOPN INJECT 0.3 MLS (1.8 MG TOTAL) INTO THE SKIN DAILY. 9 mL 3  . [DISCONTINUED] SitaGLIPtin-MetFORMIN HCl (JANUMET XR) [phone] MG TB24 TAKE 1 TABLET BY MOUTH AFTER SUPPER 90 tablet 1   No current facility-administered medications on file prior to visit.     Review of Systems:  As per HPI- otherwise negative. He feels quite fatigued  BP Readings from Last 3 Encounters:  05/09/17 101/70  05/03/17 119/78  04/12/17 100/72     Physical Examination: Vitals:   05/09/17 1203  BP: 101/70  Pulse: 97  Temp: 98.3 F (36.8 C)  SpO2: 96%   Vitals:   05/09/17 1203  Weight: 179 lb 12.8 oz (81.6 kg)  Height: 5' 11.5" (1.816 m)   Body mass index is 24.73 kg/m. Ideal Body Weight: Weight in (lb) to have BMI = 25: 181.4  GEN: WDWN, NAD, Non-toxic, A & O x 3, does not appear to feel great but is non toxic HEENT: Atraumatic, Normocephalic. Neck supple. No masses, No LAD.  Bilateral TM wnl, oropharynx normal.  PEERL,EOMI.   No meningismus Ears and Nose: No external deformity. CV: RRR, No M/G/R. No JVD. No thrill. No extra heart sounds. PULM: CTA B, no wheezes, crackles, rhonchi. No retractions.  No resp. distress. No accessory muscle use. ABD: S, NT, ND, +BS. No rebound. No HSM.  Benign belly EXTR: No c/c/e NEURO Normal gait.  PSYCH: Normally interactive. Conversant. Not depressed or anxious appearing.  Calm demeanor.  No rash noted    Assessment and Plan: Malaise - Plan: POCT rapid strep A, POCT Influenza A/B, CBC, Comprehensive metabolic panel  Low grade fever - Plan: POCT rapid strep A, POCT Influenza A/B, doxycycline (VIBRAMYCIN) 100 MG capsule  Here today with illness- low grade fever, fatigue, body aches Flu  and strep rapid tests negative Will cover with doxy due to risk of RMSF- gave 10 day supply, can dc after 7 days assuming sx remit CBC and CMP pending Advised rest, fluids, antipyretics as needed   Signed Lamar Blinks, MD  Received his labs  Results for orders placed or performed in visit on 05/09/17  CBC  Result Value Ref Range   WBC 7.4 4.0 - 10.5 K/uL   RBC 5.46 4.22 - 5.81 Mil/uL   Platelets 250.0 150.0 - 400.0 K/uL   Hemoglobin 16.7 13.0 - 17.0 g/dL   HCT 50.3 39.0 - 52.0 %   MCV 92.2 78.0 - 100.0 fl   MCHC 33.1 30.0 - 36.0 g/dL   RDW 14.2 11.5 - 15.5 %  Comprehensive metabolic panel  Result Value Ref Range   Sodium 135 135 - 145 mEq/L   Potassium 4.9 3.5 - 5.1 mEq/L   Chloride 100 96 - 112 mEq/L   CO2 27 19 - 32 mEq/L   Glucose, Bld 103 (H) 70 - 99 mg/dL   BUN 15 6 - 23 mg/dL   Creatinine, Ser 0.86 0.40 - 1.50 mg/dL   Total Bilirubin 0.6 0.2 - 1.2 mg/dL   Alkaline Phosphatase 74 39 - 117 U/L   AST 24 0 - 37 U/L   ALT 30 0 - 53 U/L   Total Protein 8.3 6.0 - 8.3 g/dL   Albumin 4.9 3.5 - 5.2 g/dL   Calcium 10.6 (H) 8.4 - 10.5 mg/dL   GFR 96.74 >60.00 mL/min  POCT rapid strep A  Result Value Ref Range   Rapid Strep A Screen Negative Negative  POCT Influenza A/B  Result Value Ref Range   Influenza A, POC Negative Negative   Influenza B, POC Negative Negative   Message to pt- all fine, but will need to repeat his calcium within the  next 1-2 months.  Will place an order for a BMP to do at his convenience

## 2017-05-10 ENCOUNTER — Ambulatory Visit (INDEPENDENT_AMBULATORY_CARE_PROVIDER_SITE_OTHER): Payer: 59 | Admitting: Family Medicine

## 2017-05-10 ENCOUNTER — Encounter: Payer: Self-pay | Admitting: Family Medicine

## 2017-05-10 VITALS — BP 121/81 | HR 97 | Ht 72.0 in | Wt 175.0 lb

## 2017-05-10 DIAGNOSIS — M65332 Trigger finger, left middle finger: Secondary | ICD-10-CM

## 2017-05-10 MED ORDER — METHYLPREDNISOLONE ACETATE 40 MG/ML IJ SUSP
20.0000 mg | Freq: Once | INTRAMUSCULAR | Status: AC
  Administered 2017-05-10: 20 mg via INTRA_ARTICULAR

## 2017-05-10 NOTE — Patient Instructions (Signed)
You have a trigger finger. You were given an injection for this today. Icing, tylenol or aleve as needed. Can consider splinting, occupational therapy, repeat injection if not improving as expected. Follow up with me in 1 month or as needed.

## 2017-05-12 NOTE — Assessment & Plan Note (Signed)
Left 3rd digit trigger finger - injection given today.  Icing, tylenol or aleve if needed.  Consider splinting, OT, repeat injection if not improving.  F/u in 1 month or prn.  After informed written consent, timeout was performed and patient was seated in chair in exam room.  Area overlying left 3rd digit A1 pulley prepped with alcohol swab then injected with 0.5:0.62mL bupivicaine: depomedrol.  Patient tolerated procedure well without immediate complications.

## 2017-05-12 NOTE — Progress Notes (Signed)
PCP: Mosie Lukes, MD  Subjective:   HPI: Patient is a 61 y.o. male here for left middle finger pain.  Patient reports since about august 5th he's had swelling, pain volar left 3rd digit. No acute injury or trauma. Catches and locks. Worse with any motion. Feels like he can't fully extend it. Pain up to 7/10 and sharp in same area. Hasn't tried any treatment for this. No skin changes, numbness.  Past Medical History:  Diagnosis Date  . Colon polyps    adenomatous  . Diverticulosis 10/26/2013  . DM (diabetes mellitus), type 2 with neurological complications ([place]) 2/70/6237  . 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 History of chicken pox    childhood  . History of kidney stones   . Hyperlipidemia 07/26/2014  . Hypertension   . Insomnia 02/25/2013  . Preventative health care 07/26/2014  . Renal lithiasis 02/21/2015  . Sciatica of right side 12/22/2012  . Seasonal allergies   . 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 Trigger finger 05/03/2017    Current Outpatient Prescriptions on File Prior to Visit  Medication Sig Dispense Refill  . aspirin EC 81 MG tablet Take 1 tablet (81 mg total) by mouth daily.    Marland Kitchen atorvastatin (LIPITOR) 40 MG tablet TAKE 1 TABLET BY MOUTH DAILY 90 tablet 0  . canagliflozin (INVOKANA) 300 MG TABS tablet Take 1 tablet (300 mg total) by mouth daily before breakfast. 30 tablet 3  . doxycycline (VIBRAMYCIN) 100 MG capsule Take 1 capsule (100 mg total) by mouth 2 (two) times daily. 20 capsule 0  . glipiZIDE (GLUCOTROL XL) 2.5 MG 24 hr tablet Take 1 tablet (2.5 mg total) by mouth daily with breakfast. 30 tablet 1  . glucose blood (ACCU-CHEK GUIDE) test strip Use to test blood sugar once daily Dx code E11.49 50 each 4  . lisinopril (PRINIVIL,ZESTRIL) 5 MG tablet Take 1 tablet (5 mg total) by mouth daily. 90 tablet 3  . loperamide (IMODIUM A-D) 2 MG tablet Take 2 mg by mouth 4 (four) times daily as  needed for diarrhea or loose stools.    . sodium chloride (OCEAN) 0.65 % SOLN nasal spray Place 1 spray into both nostrils as needed for congestion.    . Turmeric Curcumin 500 MG CAPS Take 1 capsule by mouth daily.    Marland Kitchen VICTOZA 18 MG/3ML SOPN INJECT 0.3 MLS (1.8 MG TOTAL) INTO THE SKIN DAILY. 9 mL 3  . [DISCONTINUED] SitaGLIPtin-MetFORMIN HCl (JANUMET XR) [phone] MG TB24 TAKE 1 TABLET BY MOUTH AFTER SUPPER 90 tablet 1   No current facility-administered medications on file prior to visit.     Past Surgical History:  Procedure Laterality Date  . ABDOMINAL HERNIA REPAIR    . CHOLECYSTECTOMY    . HERNIA REPAIR    . LAPAROSCOPIC APPENDECTOMY N/A 08/16/2014   Procedure: APPENDECTOMY LAPAROSCOPIC;  Surgeon: Erroll Luna, MD;  Location: [facility];  Service: General;  Laterality: N/A;  . MASS EXCISION  05/22/2012   Procedure: EXCISION MASS;  Surgeon: Joyice Faster. Cornett, MD;  Location: [facility];  Service: General;  Laterality: Left;  excision abdominal wall mass  . ROTATOR CUFF REPAIR      Allergies  Allergen Reactions  . Contrast Media [Iodinated Diagnostic Agents] Swelling    Can take if premedicated with diphenhydramine  . Iodine Swelling    Social History   Social History  . Marital status: Married    Spouse  name: N/A  . Number of children: 3  . Years of education: N/A   Occupational History  .      Security officer [employer]   Social History Main Topics  . Smoking status: Never Smoker  . Smokeless tobacco: Never Used  . Alcohol use No  . Drug use: No  . Sexual activity: Yes   Other Topics Concern  . Not on file   Social History Narrative  . No narrative on file    Family History  Problem Relation Age of Onset  . Lung cancer Mother   . Diabetes Maternal Aunt   . Cancer Paternal Grandmother   . Breast cancer Neg Hx   . Colon cancer Neg Hx   . Prostate cancer Neg Hx   . Heart disease Neg Hx     BP 121/81   Pulse 97   Ht 6' (1.829 m)   Wt 175 lb (79.4  kg)   BMI 23.73 kg/m   Review of Systems: See HPI above.     Objective:  Physical Exam:  Gen: NAD, comfortable in exam room  Left 3rd digit: Mild flexion at MCP, PIP, DIP joints.  No malrotation.  Mild swelling of digit.  No erythema, other deformity. TTP greatest at A1 pulley.  Mild tenderness flexor side of 3rd digit.  No other tenderness. Strength 5/5 with flexion and extension at MCP, PIP, DIP joints. Collateral ligaments intact. NVI distally.   Assessment & Plan:  1. Left 3rd digit trigger finger - injection given today.  Icing, tylenol or aleve if needed.  Consider splinting, OT, repeat injection if not improving.  F/u in 1 month or prn.  After informed written consent, timeout was performed and patient was seated in chair in exam room.  Area overlying left 3rd digit A1 pulley prepped with alcohol swab then injected with 0.5:0.82mL bupivicaine: depomedrol.  Patient tolerated procedure well without immediate complications.

## 2017-05-22 ENCOUNTER — Other Ambulatory Visit: Payer: Self-pay

## 2017-05-23 ENCOUNTER — Other Ambulatory Visit (INDEPENDENT_AMBULATORY_CARE_PROVIDER_SITE_OTHER): Payer: 59

## 2017-05-23 DIAGNOSIS — Z794 Long term (current) use of insulin: Secondary | ICD-10-CM | POA: Diagnosis not present

## 2017-05-23 DIAGNOSIS — E1165 Type 2 diabetes mellitus with hyperglycemia: Secondary | ICD-10-CM | POA: Diagnosis not present

## 2017-05-23 LAB — BASIC METABOLIC PANEL
BUN: 21 mg/dL (ref 6–23)
CALCIUM: 10.5 mg/dL (ref 8.4–10.5)
CO2: 27 meq/L (ref 19–32)
Chloride: 99 mEq/L (ref 96–112)
Creatinine, Ser: 0.83 mg/dL (ref 0.40–1.50)
GFR: 100.78 mL/min (ref >60.00)
GLUCOSE: 95 mg/dL (ref 70–99)
Potassium: 3.8 mEq/L (ref 3.5–5.1)
SODIUM: 134 meq/L — AB (ref 135–145)

## 2017-05-24 LAB — FRUCTOSAMINE: FRUCTOSAMINE: 292 umol/L — AB (ref 0–285)

## 2017-05-25 ENCOUNTER — Encounter: Payer: Self-pay | Admitting: Endocrinology

## 2017-05-25 ENCOUNTER — Ambulatory Visit (INDEPENDENT_AMBULATORY_CARE_PROVIDER_SITE_OTHER): Payer: 59 | Admitting: Endocrinology

## 2017-05-25 VITALS — BP 116/78 | HR 92 | Ht 72.0 in | Wt 180.4 lb

## 2017-05-25 DIAGNOSIS — E1165 Type 2 diabetes mellitus with hyperglycemia: Secondary | ICD-10-CM | POA: Diagnosis not present

## 2017-05-25 NOTE — Progress Notes (Signed)
[name], male   DOB: 10/02/1955, 61 y.o.   MRN: [id number]           Reason for Appointment:  Follow-up for Type 2 Diabetes   History of Present Illness:          Date of diagnosis of type 2 diabetes mellitus:        Background history:   He thinks his blood sugars at the time of diagnosis was around 500 and he was symptomatic with increased thirst and urination He has been taking metformin for several years but also has been tried on Amaryl Since about 2013 he has been on Januvia In early 2016 his blood sugars were significantly higher with A1c 11.9 and he was started on Lantus insulin, initially 10 units Janumet was continued Prior to consultation his A1c had gone up to 9%  Recent history:   Non-insulin hypoglycemic drugs the patient is taking are:  Invokana 300 mg daily  Victoza 1.8 mg daily, glipizide ER 2.5 mg daily   INSULIN regimen is: None  His A1c has stayed about the same at 7.6 in August previously 7.5 in 5/18 Recently fructosamine is 292  Current management, blood sugar patterns and problems identified:  His blood sugars were relatively higher on his last visit in August especially after his breakfast and lunch when he was monitoring  Glipizide ER was started at a low dose  He again does not check his blood sugars fasting and not clear if these have been high he did before or recently  He was told to also try to add some protein with breakfast and cut back on high carbohydrate meals and especially at breakfast  He has tried to do this and recently blood sugars after breakfast or not high  Blood sugars are also fairly good after his evening meal and lunchtime lately  He still fairly active physically and is running on his days off  Also on high-dose Victoza and Invokana        Side effects from medications have been: Diarrhea from high dose metformin  Compliance with the medical regimen: Improving   Glucose monitoring:  done 1-2 times a  day         Glucometer:  Accu-Chek Blood Glucose readings by download  Mean values apply above for all meters except median for One Touch  PRE-MEAL Fasting Lunch Dinner Bedtime Overall  Glucose range: ?      91-1 91   Mean/median:     131    POST-MEAL PC Breakfast PC Lunch PC Dinner  Glucose range:  130-1 62   107-191   Mean/median: 146  113  142    Self-care: The diet that the patient has been following is: tries to limit high-fat foods, carbohydrates .     Meal times are:  Breakfast is at 6 AM, Lunch: 12-1 PM Dinner: 6-7 PM   Typical meal intake: Breakfast is usually  oatmeal, usually taking lunch from home to work               Dietician visit, most recent: 11/2015               Exercise:  Running , 3 days a week, usually walking at work   Weight history:   Wt Readings from Last 3 Encounters:  05/25/17 180 lb 6.4 oz (81.8 kg)  05/10/17 175 lb (79.4 kg)  05/09/17 179 lb 12.8 oz (81.6 kg)    Glycemic control:  Lab Results  Component Value Date   HGBA1C 7.6 (H) 04/09/2017   HGBA1C 7.5 (H) 01/05/2017   HGBA1C 9.1 (H) 08/28/2016   Lab Results  Component Value Date   MICROALBUR <0.7 08/28/2016   LDLCALC 52 04/27/2017   CREATININE 0.83 05/23/2017    Lab on 05/23/2017  Component Date Value Ref Range Status  . Sodium 05/23/2017 134* 135 - 145 mEq/L Final  . Potassium 05/23/2017 3.8  3.5 - 5.1 mEq/L Final  . Chloride 05/23/2017 99  96 - 112 mEq/L Final  . CO2 05/23/2017 27  19 - 32 mEq/L Final  . Glucose, Bld 05/23/2017 95  70 - 99 mg/dL Final  . BUN 05/23/2017 21  6 - 23 mg/dL Final  . Creatinine, Ser 05/23/2017 0.83  0.40 - 1.50 mg/dL Final  . Calcium 05/23/2017 10.5  8.4 - 10.5 mg/dL Final  . GFR 05/23/2017 100.78  >60.00 mL/min Final  . Fructosamine 05/23/2017 292* 0 - 285 umol/L Final   Comment: Published reference interval for apparently healthy subjects between age 27 and 57 is 67 - 285 umol/L and in a poorly controlled diabetic population is 228 - 563  umol/L with a mean of 396 umol/L.        Allergies as of 05/25/2017      Reactions   Contrast Media [iodinated Diagnostic Agents] Swelling   Can take if premedicated with diphenhydramine   Iodine Swelling      Medication List       Accurate as of 05/25/17  1:03 PM. Always use your most recent med list.          aspirin EC 81 MG tablet Take 1 tablet (81 mg total) by mouth daily.   atorvastatin 40 MG tablet Commonly known as:  LIPITOR TAKE 1 TABLET BY MOUTH DAILY   canagliflozin 300 MG Tabs tablet Commonly known as:  INVOKANA Take 1 tablet (300 mg total) by mouth daily before breakfast.   glipiZIDE 2.5 MG 24 hr tablet Commonly known as:  GLUCOTROL XL Take 1 tablet (2.5 mg total) by mouth daily with breakfast.   glucose blood test strip Commonly known as:  ACCU-CHEK GUIDE Use to test blood sugar once daily Dx code E11.49   lisinopril 5 MG tablet Commonly known as:  PRINIVIL,ZESTRIL Take 1 tablet (5 mg total) by mouth daily.   loperamide 2 MG tablet Commonly known as:  IMODIUM A-D Take 2 mg by mouth 4 (four) times daily as needed for diarrhea or loose stools.   MULTIPLE VITAMIN PO Take 1 packet by mouth daily.   sodium chloride 0.65 % Soln nasal spray Commonly known as:  OCEAN Place 1 spray into both nostrils as needed for congestion.   Turmeric Curcumin 500 MG Caps Take 1 capsule by mouth daily.   VICTOZA 18 MG/3ML Sopn Generic drug:  liraglutide INJECT 0.3 MLS (1.8 MG TOTAL) INTO THE SKIN DAILY.       Allergies:  Allergies  Allergen Reactions  . Contrast Media [Iodinated Diagnostic Agents] Swelling    Can take if premedicated with diphenhydramine  . Iodine Swelling    Past Medical History:  Diagnosis Date  . Colon polyps    adenomatous  . Diverticulosis 10/26/2013  . DM (diabetes mellitus), type 2 with neurological complications ([place]) 4/00/8676  . 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 History of chicken pox     childhood  . History of kidney stones   . Hyperlipidemia 07/26/2014  . Hypertension   . Insomnia  02/25/2013  . Preventative health care 07/26/2014  . Renal lithiasis 02/21/2015  . Sciatica of right side 12/22/2012  . Seasonal allergies   . 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 Trigger finger 05/03/2017    Past Surgical History:  Procedure Laterality Date  . ABDOMINAL HERNIA REPAIR    . CHOLECYSTECTOMY    . HERNIA REPAIR    . LAPAROSCOPIC APPENDECTOMY N/A 08/16/2014   Procedure: APPENDECTOMY LAPAROSCOPIC;  Surgeon: Erroll Luna, MD;  Location: [facility];  Service: General;  Laterality: N/A;  . MASS EXCISION  05/22/2012   Procedure: EXCISION MASS;  Surgeon: Joyice Faster. Cornett, MD;  Location: [facility];  Service: General;  Laterality: Left;  excision abdominal wall mass  . ROTATOR CUFF REPAIR      Family History  Problem Relation Age of Onset  . Lung cancer Mother   . Diabetes Maternal Aunt   . Cancer Paternal Grandmother   . Breast cancer Neg Hx   . Colon cancer Neg Hx   . Prostate cancer Neg Hx   . Heart disease Neg Hx     Social History:  reports that he has never smoked. He has never used smokeless tobacco. He reports that he does not drink alcohol or use drugs.    Review of Systems    Lipid history: On long-term treatment with statin, Currently 40 mg Lipitor, Followed by PCP Has low HDL   Lab Results  Component Value Date   CHOL 115 04/27/2017   HDL 45.70 04/27/2017   LDLCALC 52 04/27/2017   LDLDIRECT 109.0 10/26/2014   TRIG 91.0 04/27/2017   CHOLHDL 3 04/27/2017           Hypertension: Blood pressure Is well-controlled On lisinopril, Only 5 mg.  Also on Invokana   BP Readings from Last 3 Encounters:  05/25/17 116/78  05/10/17 121/81  05/09/17 101/70     Most recent eye exam was 1/17  Most recent foot exam: 2/17    Review of Systems    LABS:  Lab on 05/23/2017  Component Date Value Ref Range  Status  . Sodium 05/23/2017 134* 135 - 145 mEq/L Final  . Potassium 05/23/2017 3.8  3.5 - 5.1 mEq/L Final  . Chloride 05/23/2017 99  96 - 112 mEq/L Final  . CO2 05/23/2017 27  19 - 32 mEq/L Final  . Glucose, Bld 05/23/2017 95  70 - 99 mg/dL Final  . BUN 05/23/2017 21  6 - 23 mg/dL Final  . Creatinine, Ser 05/23/2017 0.83  0.40 - 1.50 mg/dL Final  . Calcium 05/23/2017 10.5  8.4 - 10.5 mg/dL Final  . GFR 05/23/2017 100.78  >60.00 mL/min Final  . Fructosamine 05/23/2017 292* 0 - 285 umol/L Final   Comment: Published reference interval for apparently healthy subjects between age 64 and 68 is 56 - 285 umol/L and in a poorly controlled diabetic population is 228 - 563 umol/L with a mean of 396 umol/L.     Physical Examination:  BP 116/78   Pulse 92   Ht 6' (1.829 m)   Wt 180 lb 6.4 oz (81.8 kg)   SpO2 97%   BMI 24.47 kg/m     ASSESSMENT:  Diabetes type 2,  with normal  BMI See history of present illness for detailed discussion of current diabetes management, blood sugar patterns and problems identified  His A1c is still not at target and over 7%  However he was just started  on glipizide ER about 6 weeks ago and at least at home his blood sugars are looking better Fructosamine is minimally increased at 292  Does not have any consistently high readings after meals recently but is not checking readings in the morning He is trying to balance his meals better as discussed before and continues to be exercising  PLAN:    He will try to check blood sugar more consistently at various times including at least on his days off in the morning Continue same medication regimen Will need to discuss options for his other medications asked given his formulary will be changing Also reminded him on the need for regular eye exams  Patient Instructions  Check Ozempic or Trulicity  Do Eye exam  Some fasting sugars      Naeem Quillin 05/25/2017, 1:03 PM   Note: This office note was  prepared with Estate agent. Any transcriptional errors that result from this process are unintentional.

## 2017-05-25 NOTE — Patient Instructions (Addendum)
Check Ozempic or Trulicity  Do Eye exam  Some fasting sugars

## 2017-06-07 MED FILL — INVOKANA 300 MG TABLET: 300 | 30 days supply | Qty: 30 | Fill #1

## 2017-06-07 MED FILL — VICTOZA 18 MG/3 ML INJECT P: 18 | 30 days supply | Qty: 9 | Fill #1

## 2017-06-11 ENCOUNTER — Other Ambulatory Visit: Payer: Self-pay | Admitting: Endocrinology

## 2017-06-11 MED FILL — glipiZIDE ER 2.5 MG TB24: 2.5 | 30 days supply | Qty: 30 | Fill #0

## 2017-06-21 ENCOUNTER — Other Ambulatory Visit: Payer: Self-pay | Admitting: Family Medicine

## 2017-06-21 ENCOUNTER — Other Ambulatory Visit: Payer: Self-pay | Admitting: Endocrinology

## 2017-06-21 MED FILL — ACCU-CHEK FASTCLIX LANCETS: 30 days supply | Qty: 102 | Fill #2

## 2017-06-21 MED FILL — ATORVASTATIN 40 MG TABLET: 40 | 90 days supply | Qty: 90 | Fill #0

## 2017-06-22 MED FILL — ACCU-CHEK GUIDE TEST STRIP: 30 days supply | Qty: 50 | Fill #0

## 2017-06-27 MED FILL — LISINOPRIL 5 MG TAB: 5 | 90 days supply | Qty: 90 | Fill #3

## 2017-07-04 MED FILL — INVOKANA 300 MG TABLET: 300 | 30 days supply | Qty: 30 | Fill #2

## 2017-07-06 MED FILL — glipiZIDE ER 2.5 MG TB24: 2.5 | 30 days supply | Qty: 30 | Fill #1

## 2017-07-10 MED FILL — VICTOZA 18 MG/3 ML INJECT P: 18 | 30 days supply | Qty: 9 | Fill #2

## 2017-07-12 ENCOUNTER — Encounter: Payer: Self-pay | Admitting: Family Medicine

## 2017-07-12 ENCOUNTER — Ambulatory Visit (INDEPENDENT_AMBULATORY_CARE_PROVIDER_SITE_OTHER): Payer: 59 | Admitting: Family Medicine

## 2017-07-12 VITALS — BP 124/84 | HR 75 | Temp 97.7°F | Ht 72.0 in | Wt 192.1 lb

## 2017-07-12 DIAGNOSIS — J0110 Acute frontal sinusitis, unspecified: Secondary | ICD-10-CM

## 2017-07-12 MED ORDER — AMOXICILLIN-POT CLAVULANATE 875-125 MG PO TABS: 1.0000 | ORAL_TABLET | Freq: Two times a day (BID) | ORAL | 0 refills | Status: DC

## 2017-07-12 MED ORDER — BENZONATATE 100 MG PO CAPS: 100.0000 mg | ORAL_CAPSULE | Freq: Three times a day (TID) | ORAL | 0 refills | Status: DC | PRN

## 2017-07-12 MED FILL — BENZONATATE 100 MG CAPSULE: 100 | 10 days supply | Qty: 30 | Fill #0

## 2017-07-12 MED FILL — AMOX-CLAV 875-125 MG TABLET: 875-125 | 10 days supply | Qty: 20 | Fill #0

## 2017-07-12 NOTE — Patient Instructions (Addendum)
Most sinus infections are viral in etiology and antibiotics will not be helpful. That being said, if you start having worsening symptoms over 3 days, you are worsening by day 10 or not improving by day 14, go ahead and take it (Augmentin). You are on Day 7 as of now.   Things that would trump the time rules are fevers or pus draining from your nose.   Continue to push fluids, practice good hand hygiene, and cover your mouth if you cough.  If you start having fevers, shaking or shortness of breath, seek immediate care.  Let us know if you need anything.

## 2017-07-12 NOTE — Progress Notes (Signed)
Pre visit review using our clinic review tool, if applicable. No additional management support is needed unless otherwise documented below in the visit note. 

## 2017-07-12 NOTE — Progress Notes (Signed)
Chief Complaint  Patient presents with  . Sinusitis    cough    Albert [name] here for URI complaints.  Duration: 1 week  Associated symptoms: sinus congestion, sinus pain, ear pain, sore throat and cough Denies: itchy watery eyes, ear drainage, wheezing, shortness of breath, myalgia and fevers/rigors Treatment to date: Zycam, throat lozenge, Vick's, OTC cold and flu Sick contacts: Yes  ROS:  Const: Denies fevers HEENT: As noted in HPI Lungs: No SOB  Past Medical History:  Diagnosis Date  . Colon polyps    adenomatous  . Diverticulosis 10/26/2013  . DM (diabetes mellitus), type 2 with neurological complications ([place]) 2/33/0076  . 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 History of chicken pox    childhood  . History of kidney stones   . Hyperlipidemia 07/26/2014  . Hypertension   . Insomnia 02/25/2013  . Preventative health care 07/26/2014  . Renal lithiasis 02/21/2015  . Sciatica of right side 12/22/2012  . Seasonal allergies   . 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 Trigger finger 05/03/2017   Family History  Problem Relation Age of Onset  . Lung cancer Mother   . Diabetes Maternal Aunt   . Cancer Paternal Grandmother   . Breast cancer Neg Hx   . Colon cancer Neg Hx   . Prostate cancer Neg Hx   . Heart disease Neg Hx     BP 124/84 (BP Location: Left Arm, Patient Position: Sitting, Cuff Size: Normal)   Pulse 75   Temp 97.7 F (36.5 C) (Oral)   Ht 6' (1.829 m)   Wt 192 lb 2 oz (87.1 kg)   SpO2 97%   BMI 26.06 kg/m  General: Awake, alert, appears stated age HEENT: AT, [place], ears patent b/l and TM's neg, +TTP over frontal sinuses b/l; nares patent w/o discharge, pharynx pink and without exudates, MMM Neck: No masses or asymmetry Heart: RRR, no murmurs, no bruits Lungs: CTAB, no accessory muscle use Psych: Age appropriate judgment and insight, normal mood and affect  Acute frontal sinusitis, recurrence  not specified - Plan: benzonatate (TESSALON) 100 MG capsule, amoxicillin-clavulanate (AUGMENTIN) 875-125 MG tablet  Pocket rx instructions given.  Continue to push fluids, practice good hand hygiene, cover mouth when coughing. F/u prn. If starting to experience fevers, shaking, or shortness of breath, seek immediate care. Pt voiced understanding and agreement to the plan.  [place], DO 07/12/17 8:28 AM

## 2017-07-20 ENCOUNTER — Other Ambulatory Visit: Payer: Self-pay | Admitting: *Deleted

## 2017-07-20 NOTE — Patient Outreach (Signed)
Albert [name] transitioned from the [employer] [facility] program to the [employer] on 08/18/16 for Type II diabetes self-management assistance so will close case to the diabetes [facility] program due to delegation of disease management services to [employer] from [employer] for [place] members in 2019. [name] RN,CCM,CDE [place] Management Coordinator [facility] and [employer] [phone] Office Fax [phone]

## 2017-08-06 ENCOUNTER — Other Ambulatory Visit: Payer: Self-pay | Admitting: Endocrinology

## 2017-08-06 MED FILL — INVOKANA 300 MG TABLET: 300 | 30 days supply | Qty: 30 | Fill #3

## 2017-08-06 MED FILL — glipiZIDE ER 2.5 MG TB24: 2.5 | 30 days supply | Qty: 30 | Fill #0

## 2017-08-08 MED FILL — PENTIPS 31G X 5 MM MISC: 31G X 5 MM | 90 days supply | Qty: 100 | Fill #2

## 2017-08-08 MED FILL — VICTOZA 18 MG/3 ML INJECT P: 18 | 30 days supply | Qty: 9 | Fill #3

## 2017-08-16 ENCOUNTER — Encounter: Payer: Self-pay | Admitting: Family Medicine

## 2017-08-16 ENCOUNTER — Ambulatory Visit: Payer: 59 | Admitting: Family Medicine

## 2017-08-16 VITALS — BP 122/88 | HR 89 | Temp 98.2°F | Ht 72.0 in | Wt 184.2 lb

## 2017-08-16 DIAGNOSIS — H6982 Other specified disorders of Eustachian tube, left ear: Secondary | ICD-10-CM

## 2017-08-16 DIAGNOSIS — J209 Acute bronchitis, unspecified: Secondary | ICD-10-CM

## 2017-08-16 MED ORDER — METHYLPREDNISOLONE ACETATE 80 MG/ML IJ SUSP
80.0000 mg | Freq: Once | INTRAMUSCULAR | Status: AC
  Administered 2017-08-16: 80 mg via INTRAMUSCULAR

## 2017-08-16 MED ORDER — HYDROCODONE-HOMATROPINE 5-1.5 MG/5ML PO SYRP: 5.0000 mL | ORAL_SOLUTION | Freq: Three times a day (TID) | ORAL | 0 refills | Status: DC | PRN

## 2017-08-16 MED FILL — ACCU-CHEK GUIDE TEST STRIP: 30 days supply | Qty: 50 | Fill #1

## 2017-08-16 MED FILL — HYDROCODONE-HOMATROPINE SOL: 5-1.5 | 8 days supply | Qty: 120 | Fill #0

## 2017-08-16 NOTE — Progress Notes (Signed)
Pre visit review using our clinic review tool, if applicable. No additional management support is needed unless otherwise documented below in the visit note. 

## 2017-08-16 NOTE — Progress Notes (Signed)
Chief Complaint  Patient presents with  . Shortness of Breath  . Cough    Albert [name] here for URI complaints.  Duration: 1 week  Associated symptoms: sinus congestion, ST, rhinorrhea, ear pain, shortness of breath, chest tightness and cough Denies: sinus pain, itchy watery eyes and ear drainage Treatment to date: Vick's, Equate OTC med Sick contacts: No  ROS:  Const: Denies fevers HEENT: As noted in HPI Lungs: +SOB  Past Medical History:  Diagnosis Date  . Colon polyps    adenomatous  . Diverticulosis 10/26/2013  . DM (diabetes mellitus), type 2 with neurological complications ([name]) 6/38/7564  . 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 History of chicken pox    childhood  . History of kidney stones   . Hyperlipidemia 07/26/2014  . Hypertension   . Insomnia 02/25/2013  . Preventative health care 07/26/2014  . Renal lithiasis 02/21/2015  . Sciatica of right side 12/22/2012  . Seasonal allergies   . 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 Trigger finger 05/03/2017   Family History  Problem Relation Age of Onset  . Lung cancer Mother   . Diabetes Maternal Aunt   . Cancer Paternal Grandmother   . Breast cancer Neg Hx   . Colon cancer Neg Hx   . Prostate cancer Neg Hx   . Heart disease Neg Hx     BP 122/88 (BP Location: Left Arm, Patient Position: Sitting, Cuff Size: Large)   Pulse 89   Temp 98.2 F (36.8 C) (Oral)   Ht 6' (1.829 m)   Wt 184 lb 4 oz (83.6 kg)   SpO2 96%   BMI 24.99 kg/m  General: Awake, alert, appears stated age HEENT: AT, [place], ears patent b/l and TM's neg, nares patent w/o discharge, pharynx pink and without exudates, MMM Neck: No masses or asymmetry Heart: RRR, no murmurs, no bruits Lungs: CTAB, no accessory muscle use Psych: Age appropriate judgment and insight, normal mood and affect  Acute bronchitis, unspecified organism - Plan: HYDROcodone-homatropine (HYCODAN) 5-1.5 MG/5ML  syrup  Dysfunction of left eustachian tube  Orders as above. Do not drive, drink alcohol or do anything that requires alertness while on syrup.  Continue to push fluids, practice good hand hygiene, cover mouth when coughing. F/u prn. If starting to experience fevers, shaking, or shortness of breath, seek immediate care. Pt voiced understanding and agreement to the plan.  [place], DO 08/16/17 10:30 AM

## 2017-08-16 NOTE — Patient Instructions (Signed)
Continue to push fluids, practice good hand hygiene, and cover your mouth if you cough.  If you start having fevers, shaking or shortness of breath, seek immediate care.  Do not drink alcohol or drive while on this medication.  Let us know if you need anything.

## 2017-08-16 NOTE — Addendum Note (Signed)
Addended by: Sharon Seller B on: 08/16/2017 10:46 AM   Modules accepted: Orders

## 2017-08-19 ENCOUNTER — Encounter: Payer: Self-pay | Admitting: Family Medicine

## 2017-08-20 ENCOUNTER — Encounter: Payer: Self-pay | Admitting: Family Medicine

## 2017-08-21 ENCOUNTER — Other Ambulatory Visit: Payer: Self-pay | Admitting: Family Medicine

## 2017-08-21 MED ORDER — AZITHROMYCIN 250 MG PO TABS: ORAL_TABLET | ORAL | 0 refills | Status: DC

## 2017-08-21 MED FILL — AZITHROMYCIN 250 MG TABLET: 250 | 5 days supply | Qty: 6 | Fill #0

## 2017-08-22 ENCOUNTER — Other Ambulatory Visit (INDEPENDENT_AMBULATORY_CARE_PROVIDER_SITE_OTHER): Payer: 59

## 2017-08-22 DIAGNOSIS — E1165 Type 2 diabetes mellitus with hyperglycemia: Secondary | ICD-10-CM | POA: Diagnosis not present

## 2017-08-22 LAB — BASIC METABOLIC PANEL
BUN: 11 mg/dL (ref 6–23)
CO2: 27 mEq/L (ref 19–32)
Calcium: 9.5 mg/dL (ref 8.4–10.5)
Chloride: 100 mEq/L (ref 96–112)
Creatinine, Ser: 0.89 mg/dL (ref 0.40–1.50)
GFR: 92.9 mL/min (ref >60.00)
GLUCOSE: 173 mg/dL — AB (ref 70–99)
POTASSIUM: 4.3 meq/L (ref 3.5–5.1)
Sodium: 137 mEq/L (ref 135–145)

## 2017-08-22 LAB — MICROALBUMIN / CREATININE URINE RATIO
Creatinine,U: 52.7 mg/dL
Microalb Creat Ratio: 1.3 mg/g (ref 0.0–30.0)

## 2017-08-22 LAB — HEMOGLOBIN A1C: Hgb A1c MFr Bld: 7.3 % — ABNORMAL HIGH (ref 4.6–6.5)

## 2017-08-24 ENCOUNTER — Other Ambulatory Visit: Payer: Self-pay

## 2017-08-27 ENCOUNTER — Ambulatory Visit: Payer: Self-pay | Admitting: Endocrinology

## 2017-08-31 ENCOUNTER — Ambulatory Visit (INDEPENDENT_AMBULATORY_CARE_PROVIDER_SITE_OTHER): Payer: 59 | Admitting: Endocrinology

## 2017-08-31 ENCOUNTER — Encounter: Payer: Self-pay | Admitting: Endocrinology

## 2017-08-31 VITALS — BP 120/82 | HR 87 | Ht 72.0 in | Wt 187.0 lb

## 2017-08-31 DIAGNOSIS — E1165 Type 2 diabetes mellitus with hyperglycemia: Secondary | ICD-10-CM | POA: Diagnosis not present

## 2017-08-31 MED ORDER — CANAGLIFLOZIN 300 MG PO TABS: 300.0000 mg | ORAL_TABLET | Freq: Every day | ORAL | 3 refills | Status: DC

## 2017-08-31 MED ORDER — INSULIN DETEMIR 100 UNIT/ML FLEXPEN: 12.0000 [IU] | PEN_INJECTOR | Freq: Every day | SUBCUTANEOUS | 1 refills | Status: DC

## 2017-08-31 MED FILL — LEVEMIR FLEXTOUCH 100 UNITS: 100 | 25 days supply | Qty: 3 | Fill #0

## 2017-08-31 MED FILL — INVOKANA 300 MG TABLET: 300 | 30 days supply | Qty: 30 | Fill #0

## 2017-08-31 NOTE — Progress Notes (Signed)
[name], male   DOB: 11-23-1955, 62 y.o.   MRN: [id number]           Reason for Appointment:  Follow-up for Type 2 Diabetes   History of Present Illness:          Date of diagnosis of type 2 diabetes mellitus:        Background history:   He thinks his blood sugars at the time of diagnosis was around 500 and he was symptomatic with increased thirst and urination He has been taking metformin for several years but also has been tried on Amaryl Since about 2013 he has been on Januvia In early 2016 his blood sugars were significantly higher with A1c 11.9 and he was started on Lantus insulin, initially 10 units Janumet was continued Prior to consultation his A1c had gone up to 9%  Recent history:   Non-insulin hypoglycemic drugs the patient is taking are:  Invokana 300 mg daily  Victoza 1.8 mg daily, glipizide ER 2.5 mg daily   INSULIN regimen is: None  His A1c is slightly better at 7.3, previously 7.6  Current management, blood sugar patterns and problems identified:  Glipizide ER was started started in 03/2017 because of relatively high blood sugars especially after meals  With this his blood sugars appear to be somewhat better overall and averaging about 140-160 after meals in the afternoon and evening but over 170 in the morning  He again does not check his blood sugars fasting despite reminders; however his fasting blood sugar in the lab was 173  Overall blood sugars recently are averaging higher done on his last visit in October  He has been taking his Invokana and Victoza consistently as directed  With various injuries he has not done as much exercise as usual except his walking at work  He still thinks he is watching his portions, weight seems to be variable        Side effects from medications have been: Diarrhea from high dose metformin  Compliance with the medical regimen: Improving   Glucose monitoring:  done 1-2 times a day         Glucometer:   Accu-Chek Blood Glucose readings by download  Mean values apply above for all meters except median for One Touch  PRE-MEAL Fasting Lunch Dinner Bedtime Overall  Glucose range: ?        Mean/median:  136    150    POST-MEAL PC Breakfast PC Lunch PC Dinner  Glucose range: 160-187  128-177  133-223   Mean/median: 172  145  161      Self-care: The diet that the patient has been following is: tries to limit high-fat foods, carbohydrates .     Meal times are:  Breakfast is at 6 AM, Lunch: 12-1 PM Dinner: 6-7 PM    Typical meal intake: Breakfast is usually cereal or oatmeal on weekdays at the cafeteria, usually taking lunch from home to work               Dietician visit, most recent: 11/2015               Exercise:   usually walking at work   Weight history:   Wt Readings from Last 3 Encounters:  08/31/17 187 lb (84.8 kg)  08/16/17 184 lb 4 oz (83.6 kg)  07/12/17 192 lb 2 oz (87.1 kg)    Glycemic control:   Lab Results  Component Value Date   HGBA1C  7.3 (H) 08/22/2017   HGBA1C 7.6 (H) 04/09/2017   HGBA1C 7.5 (H) 01/05/2017   Lab Results  Component Value Date   MICROALBUR <0.7 08/22/2017   LDLCALC 52 04/27/2017   CREATININE 0.89 08/22/2017    No visits with results within 1 Week(s) from this visit.  Latest known visit with results is:  Lab on 08/22/2017  Component Date Value Ref Range Status  . Microalb, Ur 08/22/2017 <0.7  0.0 - 1.9 mg/dL Final  . Creatinine,U 08/22/2017 52.7  mg/dL Final  . Microalb Creat Ratio 08/22/2017 1.3  0.0 - 30.0 mg/g Final  . Sodium 08/22/2017 137  135 - 145 mEq/L Final  . Potassium 08/22/2017 4.3  3.5 - 5.1 mEq/L Final  . Chloride 08/22/2017 100  96 - 112 mEq/L Final  . CO2 08/22/2017 27  19 - 32 mEq/L Final  . Glucose, Bld 08/22/2017 173* 70 - 99 mg/dL Final  . BUN 08/22/2017 11  6 - 23 mg/dL Final  . Creatinine, Ser 08/22/2017 0.89  0.40 - 1.50 mg/dL Final  . Calcium 08/22/2017 9.5  8.4 - 10.5 mg/dL Final  . GFR 08/22/2017 92.90   >60.00 mL/min Final  . Hgb A1c MFr Bld 08/22/2017 7.3* 4.6 - 6.5 % Final   Glycemic Control Guidelines for People with Diabetes:Non Diabetic:  <6%Goal of Therapy: <7%Additional Action Suggested:  >8%        Allergies as of 08/31/2017      Reactions   Contrast Media [iodinated Diagnostic Agents] Swelling   Can take if premedicated with diphenhydramine   Iodine Swelling   Metformin And Related Diarrhea      Medication List        Accurate as of 08/31/17 11:59 PM. Always use your most recent med list.          ACCU-CHEK GUIDE test strip Generic drug:  glucose blood USE TO TEST BLOOD SUGAR DAILY AT VARIABLE TIMES   aspirin EC 81 MG tablet Take 1 tablet (81 mg total) by mouth daily.   atorvastatin 40 MG tablet Commonly known as:  LIPITOR TAKE 1 TABLET BY MOUTH DAILY   azithromycin 250 MG tablet Commonly known as:  ZITHROMAX Take 2 tabs the first day and then 1 tab daily until you run out.   canagliflozin 300 MG Tabs tablet Commonly known as:  INVOKANA Take 1 tablet (300 mg total) by mouth daily before breakfast.   glipiZIDE 2.5 MG 24 hr tablet Commonly known as:  GLUCOTROL XL TAKE 1 TABLET (2.5 MG TOTAL) BY MOUTH DAILY WITH BREAKFAST.   Insulin Detemir 100 UNIT/ML Pen Commonly known as:  LEVEMIR FLEXTOUCH Inject 12 Units into the skin daily at 10 pm.   lisinopril 5 MG tablet Commonly known as:  PRINIVIL,ZESTRIL Take 1 tablet (5 mg total) by mouth daily.   loperamide 2 MG tablet Commonly known as:  IMODIUM A-D Take 2 mg by mouth 4 (four) times daily as needed for diarrhea or loose stools.   MULTIPLE VITAMIN PO Take 1 packet by mouth daily.   sodium chloride 0.65 % Soln nasal spray Commonly known as:  OCEAN Place 1 spray into both nostrils as needed for congestion.   Turmeric Curcumin 500 MG Caps Take 1 capsule by mouth daily.   VICTOZA 18 MG/3ML Sopn Generic drug:  liraglutide INJECT 0.3 MLS (1.8 MG TOTAL) INTO THE SKIN DAILY.       Allergies:    Allergies  Allergen Reactions  . Contrast Media [Iodinated Diagnostic Agents] Swelling  Can take if premedicated with diphenhydramine  . Iodine Swelling  . Metformin And Related Diarrhea    Past Medical History:  Diagnosis Date  . Colon polyps    adenomatous  . Diverticulosis 10/26/2013  . DM (diabetes mellitus), type 2 with neurological complications ([place]) 8/33/8250  . 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 History of chicken pox    childhood  . History of kidney stones   . Hyperlipidemia 07/26/2014  . Hypertension   . Insomnia 02/25/2013  . Preventative health care 07/26/2014  . Renal lithiasis 02/21/2015  . Sciatica of right side 12/22/2012  . Seasonal allergies   . 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 Trigger finger 05/03/2017    Past Surgical History:  Procedure Laterality Date  . ABDOMINAL HERNIA REPAIR    . CHOLECYSTECTOMY    . HERNIA REPAIR    . LAPAROSCOPIC APPENDECTOMY N/A 08/16/2014   Procedure: APPENDECTOMY LAPAROSCOPIC;  Surgeon: Erroll Luna, MD;  Location: [facility];  Service: General;  Laterality: N/A;  . MASS EXCISION  05/22/2012   Procedure: EXCISION MASS;  Surgeon: Joyice Faster. Cornett, MD;  Location: [facility];  Service: General;  Laterality: Left;  excision abdominal wall mass  . ROTATOR CUFF REPAIR      Family History  Problem Relation Age of Onset  . Lung cancer Mother   . Diabetes Maternal Aunt   . Cancer Paternal Grandmother   . Breast cancer Neg Hx   . Colon cancer Neg Hx   . Prostate cancer Neg Hx   . Heart disease Neg Hx     Social History:  reports that  has never smoked. he has never used smokeless tobacco. He reports that he does not drink alcohol or use drugs.    Review of Systems    Lipid history: On long-term treatment with statin, on 40 mg Lipitor, Followed by PCP   Lab Results  Component Value Date   CHOL 115 04/27/2017   HDL 45.70 04/27/2017   LDLCALC  52 04/27/2017   LDLDIRECT 109.0 10/26/2014   TRIG 91.0 04/27/2017   CHOLHDL 3 04/27/2017           Hypertension: Blood pressure has been minimally elevated in the past and well-controlled with 5 mg lisinopril, followed by PCP   BP Readings from Last 3 Encounters:  08/31/17 120/82  08/16/17 122/88  07/12/17 124/84     Most recent eye exam was 1/17  Most recent foot exam: 2/17    Review of Systems    LABS:  No visits with results within 1 Week(s) from this visit.  Latest known visit with results is:  Lab on 08/22/2017  Component Date Value Ref Range Status  . Microalb, Ur 08/22/2017 <0.7  0.0 - 1.9 mg/dL Final  . Creatinine,U 08/22/2017 52.7  mg/dL Final  . Microalb Creat Ratio 08/22/2017 1.3  0.0 - 30.0 mg/g Final  . Sodium 08/22/2017 137  135 - 145 mEq/L Final  . Potassium 08/22/2017 4.3  3.5 - 5.1 mEq/L Final  . Chloride 08/22/2017 100  96 - 112 mEq/L Final  . CO2 08/22/2017 27  19 - 32 mEq/L Final  . Glucose, Bld 08/22/2017 173* 70 - 99 mg/dL Final  . BUN 08/22/2017 11  6 - 23 mg/dL Final  . Creatinine, Ser 08/22/2017 0.89  0.40 - 1.50 mg/dL Final  . Calcium 08/22/2017 9.5  8.4 - 10.5 mg/dL Final  . GFR 08/22/2017 92.90  >  60.00 mL/min Final  . Hgb A1c MFr Bld 08/22/2017 7.3* 4.6 - 6.5 % Final   Glycemic Control Guidelines for People with Diabetes:Non Diabetic:  <6%Goal of Therapy: <7%Additional Action Suggested:  >8%     Physical Examination:  BP 120/82   Pulse 87   Ht 6' (1.829 m)   Wt 187 lb (84.8 kg)   SpO2 97%   BMI 25.36 kg/m     ASSESSMENT:  Diabetes type 2,  with normal  BMI See history of present illness for detailed discussion of current diabetes management, blood sugar patterns and problems identified  His A1c is still not at target and consistently over 7%  His fasting readings appear to be the highest of the day and no consistent high readings after meals He is generally trying to watch his diet although did not get much protein at  breakfast Recently not exercising also Not clearly benefiting from glipizide ER although A1c is slightly better than before  No microalbuminuria  PLAN:    Start BASAL insulin Discussed action of basal insulin, timing of injection, duration of action and in detail discussed titration of the dose based on fasting blood sugar patterns over 3 days. He will use a flowsheet that was given to him and explained how to work on this Emphasized that he will have to start checking blood sugars fasting consistently now to help adjust his insulin He can start with 6 units of LEVEMIR Most likely he can taper off and stop his glipizide if he starts having low normal blood sugars during the day  Also discussed need to the use of the freestyle Libre sensor and how this would be used as well as benefits and information provided from this He will try to check insurance coverage for this Discussed that she needs to check his blood sugars at least 3 times a day with this and this will also help him with establishing his blood sugar patterns after meals also  Will have him come back short-term follow-up  Needs regular eye exams  There are no Patient Instructions on file for this visit.   Counseling time on subjects discussed in assessment and plan sections is over 50% of today's 25 minute visit   Elayne Snare 09/02/2017, 11:19 AM   Note: This office note was prepared with Dragon voice recognition system technology. Any transcriptional errors that result from this process are unintentional.

## 2017-09-06 ENCOUNTER — Telehealth: Payer: Self-pay | Admitting: Endocrinology

## 2017-09-06 NOTE — Telephone Encounter (Signed)
Pt stated that when he came into office last time dr gave him a brochure for the [employer]. He states that if dr send in script that it would be denied by insurance.   Pt isnt sure what he should do   Please advise

## 2017-09-07 NOTE — Telephone Encounter (Signed)
If the freestyle [name] is not covered he'll need to continue using the Accu-Chek

## 2017-09-14 NOTE — Telephone Encounter (Signed)
Left detailed vm with the instructions from note below and requested he call back if he has any questions

## 2017-09-18 ENCOUNTER — Other Ambulatory Visit: Payer: Self-pay | Admitting: Endocrinology

## 2017-09-18 ENCOUNTER — Encounter: Payer: Self-pay | Admitting: Family Medicine

## 2017-09-18 ENCOUNTER — Other Ambulatory Visit: Payer: Self-pay

## 2017-09-18 MED ORDER — ZOLPIDEM TARTRATE 10 MG PO TABS: 10.0000 mg | ORAL_TABLET | Freq: Every evening | ORAL | 0 refills | Status: DC | PRN

## 2017-09-18 MED FILL — ZOLPIDEM TARTRATE 10 MG TAB: 10 | 5 days supply | Qty: 5 | Fill #0

## 2017-09-19 MED FILL — VICTOZA 18 MG/3 ML INJECT P: 18 | 30 days supply | Qty: 9 | Fill #0

## 2017-10-02 MED FILL — INVOKANA 300 MG TABLET: 300 | 30 days supply | Qty: 30 | Fill #1

## 2017-10-09 MED FILL — LEVEMIR FLEXTOUCH 100 UNITS: 100 | 25 days supply | Qty: 3 | Fill #1

## 2017-10-11 ENCOUNTER — Ambulatory Visit: Payer: 59 | Admitting: Family Medicine

## 2017-10-11 ENCOUNTER — Encounter: Payer: Self-pay | Admitting: Family Medicine

## 2017-10-11 VITALS — BP 120/84 | HR 85 | Temp 98.1°F | Resp 18 | Wt 181.6 lb

## 2017-10-11 DIAGNOSIS — I10 Essential (primary) hypertension: Secondary | ICD-10-CM | POA: Diagnosis not present

## 2017-10-11 DIAGNOSIS — E1149 Type 2 diabetes mellitus with other diabetic neurological complication: Secondary | ICD-10-CM

## 2017-10-11 DIAGNOSIS — G4700 Insomnia, unspecified: Secondary | ICD-10-CM | POA: Diagnosis not present

## 2017-10-11 MED ORDER — ZOLPIDEM TARTRATE 10 MG PO TABS: 10.0000 mg | ORAL_TABLET | Freq: Every evening | ORAL | 0 refills | Status: DC | PRN

## 2017-10-11 MED FILL — ZOLPIDEM TARTRATE 10 MG TAB: 10 | 30 days supply | Qty: 30 | Fill #0

## 2017-10-11 NOTE — Assessment & Plan Note (Signed)
Well controlled, no changes to meds. Encouraged heart healthy diet such as the DASH diet and exercise as tolerated.  °

## 2017-10-11 NOTE — Assessment & Plan Note (Signed)
Encouraged good sleep hygiene such as dark, quiet room. No blue/green glowing lights such as computer screens in bedroom. No alcohol or stimulants in evening. Cut down on caffeine as able. Regular exercise is helpful but not just prior to bed time. Had a good response to Ambien so is allowed to have a refill.

## 2017-10-11 NOTE — Patient Instructions (Signed)

## 2017-10-11 NOTE — Progress Notes (Signed)
Subjective:  I acted as a Education administrator for Dr. Charlett Blake. [place], [place]  [name], male    DOB: August 16, 1955, 62 y.o.   MRN: [id number]  No chief complaint on file.   HPI  Patient is in today for an acute visit for not sleeping. Has been under a good bit of stress at work and at home. Used Ambien 5 mg prn a couple times with good results and is interested in continuing it for a short time. He had no concerning side effects. Denies CP/palp/SOB/HA/congestion/fevers/GI or GU c/o. Taking meds as prescribed  Patient Care Team: Mosie Lukes, MD as PCP - General (Family Medicine)   Past Medical History:  Diagnosis Date  . Colon polyps    adenomatous  . Diverticulosis 10/26/2013  . DM (diabetes mellitus), type 2 with neurological complications ([place]) 3/81/8299  . 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 History of chicken pox    childhood  . History of kidney stones   . Hyperlipidemia 07/26/2014  . Hypertension   . Insomnia 02/25/2013  . Preventative health care 07/26/2014  . Renal lithiasis 02/21/2015  . Sciatica of right side 12/22/2012  . Seasonal allergies   . 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 Trigger finger 05/03/2017    Past Surgical History:  Procedure Laterality Date  . ABDOMINAL HERNIA REPAIR    . CHOLECYSTECTOMY    . HERNIA REPAIR    . LAPAROSCOPIC APPENDECTOMY N/A 08/16/2014   Procedure: APPENDECTOMY LAPAROSCOPIC;  Surgeon: Erroll Luna, MD;  Location: [facility];  Service: General;  Laterality: N/A;  . MASS EXCISION  05/22/2012   Procedure: EXCISION MASS;  Surgeon: Joyice Faster. Cornett, MD;  Location: [facility];  Service: General;  Laterality: Left;  excision abdominal wall mass  . ROTATOR CUFF REPAIR      Family History  Problem Relation Age of Onset  . Lung cancer Mother   . Diabetes Maternal Aunt   . Cancer Paternal Grandmother   . Breast cancer Neg Hx   . Colon cancer Neg Hx   .  Prostate cancer Neg Hx   . Heart disease Neg Hx     Social History   Socioeconomic History  . Marital status: Married    Spouse name: Not on file  . Number of children: 3  . Years of education: Not on file  . Highest education level: Not on file  Social Needs  . Financial resource strain: Not on file  . Food insecurity - worry: Not on file  . Food insecurity - inability: Not on file  . Transportation needs - medical: Not on file  . Transportation needs - non-medical: Not on file  Occupational History    Comment: Animal nutritionist [employer]  Tobacco Use  . Smoking status: Never Smoker  . Smokeless tobacco: Never Used  Substance and Sexual Activity  . Alcohol use: No  . Drug use: No  . Sexual activity: Yes  Other Topics Concern  . Not on file  Social History Narrative  . Not on file    Outpatient Medications Prior to Visit  Medication Sig Dispense Refill  . ACCU-CHEK GUIDE test strip USE TO TEST BLOOD SUGAR DAILY AT VARIABLE TIMES 50 each 4  . aspirin EC 81 MG tablet Take 1 tablet (81 mg total) by mouth daily.    Marland Kitchen atorvastatin (LIPITOR) 40 MG tablet TAKE 1 TABLET BY MOUTH DAILY 90 tablet 0  .  canagliflozin (INVOKANA) 300 MG TABS tablet Take 1 tablet (300 mg total) by mouth daily before breakfast. 30 tablet 3  . Insulin Detemir (LEVEMIR FLEXTOUCH) 100 UNIT/ML Pen Inject 12 Units into the skin daily at 10 pm. 15 mL 1  . lisinopril (PRINIVIL,ZESTRIL) 5 MG tablet Take 1 tablet (5 mg total) by mouth daily. 90 tablet 3  . loperamide (IMODIUM A-D) 2 MG tablet Take 2 mg by mouth 4 (four) times daily as needed for diarrhea or loose stools.    . MULTIPLE VITAMIN PO Take 1 packet by mouth daily.    . sodium chloride (OCEAN) 0.65 % SOLN nasal spray Place 1 spray into both nostrils as needed for congestion.    . Turmeric Curcumin 500 MG CAPS Take 1 capsule by mouth daily.    Marland Kitchen VICTOZA 18 MG/3ML SOPN INJECT 0.3 MLS (1.8 MG TOTAL) INTO THE SKIN DAILY. 9 mL 3  . zolpidem (AMBIEN) 10 MG tablet  Take 1 tablet (10 mg total) by mouth at bedtime as needed for sleep. 5 tablet 0  . azithromycin (ZITHROMAX) 250 MG tablet Take 2 tabs the first day and then 1 tab daily until you run out. (Patient not taking: Reported on 08/31/2017) 6 tablet 0  . glipiZIDE (GLUCOTROL XL) 2.5 MG 24 hr tablet TAKE 1 TABLET (2.5 MG TOTAL) BY MOUTH DAILY WITH BREAKFAST. 30 tablet 1   No facility-administered medications prior to visit.     Allergies  Allergen Reactions  . Contrast Media [Iodinated Diagnostic Agents] Swelling    Can take if premedicated with diphenhydramine  . Iodine Swelling  . Metformin And Related Diarrhea    Review of Systems  Constitutional: Negative for fever and malaise/fatigue.  HENT: Negative for congestion.   Eyes: Negative for blurred vision.  Respiratory: Negative for shortness of breath.   Cardiovascular: Negative for chest pain, palpitations and leg swelling.  Gastrointestinal: Negative for abdominal pain, blood in stool and nausea.  Genitourinary: Negative for dysuria and frequency.  Musculoskeletal: Negative for falls.  Skin: Negative for rash.  Neurological: Negative for dizziness, loss of consciousness and headaches.  Endo/Heme/Allergies: Negative for environmental allergies.  Psychiatric/Behavioral: Negative for depression. The patient has insomnia. The patient is not nervous/anxious.        Objective:    Physical Exam  Constitutional: He is oriented to person, place, and time. He appears well-developed and well-nourished. No distress.  HENT:  Head: Normocephalic and atraumatic.  Nose: Nose normal.  Eyes: Right eye exhibits no discharge. Left eye exhibits no discharge.  Neck: Normal range of motion. Neck supple.  Cardiovascular: Normal rate and regular rhythm.  Murmur heard. Pulmonary/Chest: Effort normal and breath sounds normal.  Abdominal: Soft. Bowel sounds are normal. There is no tenderness.  Musculoskeletal: He exhibits no edema.  Neurological: He is  alert and oriented to person, place, and time.  Skin: Skin is warm and dry.  Psychiatric: He has a normal mood and affect.  Nursing note and vitals reviewed.   BP 120/84 (BP Location: Left Arm, Patient Position: Sitting, Cuff Size: Normal)   Pulse 85   Temp 98.1 F (36.7 C) (Oral)   Resp 18   Wt 181 lb 9.6 oz (82.4 kg)   SpO2 98%   BMI 24.63 kg/m  Wt Readings from Last 3 Encounters:  10/11/17 181 lb 9.6 oz (82.4 kg)  08/31/17 187 lb (84.8 kg)  08/16/17 184 lb 4 oz (83.6 kg)   BP Readings from Last 3 Encounters:  10/11/17 120/84  08/31/17  120/82  08/16/17 122/88     Immunization History  Administered Date(s) Administered  . Influenza,inj,Quad PF,6+ Mos 06/02/2013  . Influenza-Unspecified 05/05/2014, 05/02/2015, 05/08/2016  . Pneumococcal Conjugate-13 06/02/2013  . Pneumococcal Polysaccharide-23 09/07/2015  . Pneumococcal-Unspecified 08/16/2009  . Tdap 08/22/2010    Health Maintenance  Topic Date Due  . OPHTHALMOLOGY EXAM  09/02/2016  . FOOT EXAM  10/06/2016  . HEMOGLOBIN A1C  02/19/2018  . COLONOSCOPY  10/23/2018  . TETANUS/TDAP  08/22/2020  . PNEUMOCOCCAL POLYSACCHARIDE VACCINE  Completed  . INFLUENZA VACCINE  Completed  . Hepatitis C Screening  Completed  . HIV Screening  Completed    Lab Results  Component Value Date   WBC 7.4 05/09/2017   HGB 16.7 05/09/2017   HCT 50.3 05/09/2017   PLT 250.0 05/09/2017   GLUCOSE 173 (H) 08/22/2017   CHOL 115 04/27/2017   TRIG 91.0 04/27/2017   HDL 45.70 04/27/2017   LDLDIRECT 109.0 10/26/2014   LDLCALC 52 04/27/2017   ALT 30 05/09/2017   AST 24 05/09/2017   NA 137 08/22/2017   K 4.3 08/22/2017   CL 100 08/22/2017   CREATININE 0.89 08/22/2017   BUN 11 08/22/2017   CO2 27 08/22/2017   TSH 1.42 04/27/2017   PSA 0.86 04/27/2017   INR 1.10 08/16/2014   HGBA1C 7.3 (H) 08/22/2017   MICROALBUR <0.7 08/22/2017    Lab Results  Component Value Date   TSH 1.42 04/27/2017   Lab Results  Component Value Date   WBC  7.4 05/09/2017   HGB 16.7 05/09/2017   HCT 50.3 05/09/2017   MCV 92.2 05/09/2017   PLT 250.0 05/09/2017   Lab Results  Component Value Date   NA 137 08/22/2017   K 4.3 08/22/2017   CO2 27 08/22/2017   GLUCOSE 173 (H) 08/22/2017   BUN 11 08/22/2017   CREATININE 0.89 08/22/2017   BILITOT 0.6 05/09/2017   ALKPHOS 74 05/09/2017   AST 24 05/09/2017   ALT 30 05/09/2017   PROT 8.3 05/09/2017   ALBUMIN 4.9 05/09/2017   CALCIUM 9.5 08/22/2017   ANIONGAP 11 01/31/2015   GFR 92.90 08/22/2017   Lab Results  Component Value Date   CHOL 115 04/27/2017   Lab Results  Component Value Date   HDL 45.70 04/27/2017   Lab Results  Component Value Date   LDLCALC 52 04/27/2017   Lab Results  Component Value Date   TRIG 91.0 04/27/2017   Lab Results  Component Value Date   CHOLHDL 3 04/27/2017   Lab Results  Component Value Date   HGBA1C 7.3 (H) 08/22/2017         Assessment & Plan:   Problem List Items Addressed This Visit    DM (diabetes mellitus), type 2 with neurological complications ([place])    [id number] acceptable, minimize simple carbs. Increase exercise as tolerated. Follows with endocrinology      HTN (hypertension)    Well controlled, no changes to meds. Encouraged heart healthy diet such as the DASH diet and exercise as tolerated.       Insomnia - Primary    Encouraged good sleep hygiene such as dark, quiet room. No blue/green glowing lights such as computer screens in bedroom. No alcohol or stimulants in evening. Cut down on caffeine as able. Regular exercise is helpful but not just prior to bed time. Had a good response to Ambien so is allowed to have a refill.       Relevant Medications   zolpidem (AMBIEN) 10 MG  tablet      I have discontinued Shreyan Hinz. Terhaar's glipiZIDE and azithromycin. I am also having him maintain his loperamide, aspirin EC, sodium chloride, lisinopril, Turmeric Curcumin, MULTIPLE VITAMIN PO, atorvastatin, ACCU-CHEK GUIDE, Insulin Detemir,  canagliflozin, VICTOZA, and zolpidem.  Meds ordered this encounter  Medications  . DISCONTD: zolpidem (AMBIEN) 10 MG tablet    Sig: Take 1 tablet (10 mg total) by mouth at bedtime as needed for sleep.    Dispense:  30 tablet    Refill:  0  . DISCONTD: zolpidem (AMBIEN) 10 MG tablet    Sig: Take 1 tablet (10 mg total) by mouth at bedtime as needed for sleep.    Dispense:  30 tablet    Refill:  0  . zolpidem (AMBIEN) 10 MG tablet    Sig: Take 1 tablet (10 mg total) by mouth at bedtime as needed for sleep.    Dispense:  30 tablet    Refill:  0    CMA served as scribe during this visit. History, Physical and Plan performed by medical provider. Documentation and orders reviewed and attested to.  [name], MD

## 2017-10-11 NOTE — Assessment & Plan Note (Signed)
hgba1c acceptable, minimize simple carbs. Increase exercise as tolerated. Follows with endocrinology.  

## 2017-10-26 ENCOUNTER — Other Ambulatory Visit (INDEPENDENT_AMBULATORY_CARE_PROVIDER_SITE_OTHER): Payer: 59

## 2017-10-26 DIAGNOSIS — E1165 Type 2 diabetes mellitus with hyperglycemia: Secondary | ICD-10-CM

## 2017-10-26 LAB — BASIC METABOLIC PANEL
BUN: 14 mg/dL (ref 6–23)
CO2: 25 mEq/L (ref 19–32)
Calcium: 9.5 mg/dL (ref 8.4–10.5)
Chloride: 101 mEq/L (ref 96–112)
Creatinine, Ser: 0.85 mg/dL (ref 0.40–1.50)
GFR: 97.9 mL/min (ref >60.00)
GLUCOSE: 161 mg/dL — AB (ref 70–99)
POTASSIUM: 4.2 meq/L (ref 3.5–5.1)
SODIUM: 135 meq/L (ref 135–145)

## 2017-10-27 LAB — FRUCTOSAMINE: FRUCTOSAMINE: 334 umol/L — AB (ref 0–285)

## 2017-10-29 ENCOUNTER — Ambulatory Visit (INDEPENDENT_AMBULATORY_CARE_PROVIDER_SITE_OTHER): Payer: 59 | Admitting: Endocrinology

## 2017-10-29 ENCOUNTER — Encounter: Payer: Self-pay | Admitting: Endocrinology

## 2017-10-29 VITALS — BP 140/90 | HR 92 | Wt 189.0 lb

## 2017-10-29 DIAGNOSIS — E1165 Type 2 diabetes mellitus with hyperglycemia: Secondary | ICD-10-CM

## 2017-10-29 DIAGNOSIS — Z794 Long term (current) use of insulin: Secondary | ICD-10-CM

## 2017-10-29 NOTE — Patient Instructions (Addendum)
Levemir 16 units and keep going up, am target 120-130  Send #s of bedtime sugars in 1 week  Check blood sugars on waking up daily   Also check blood sugars about 2 hours after a meal and do this after different meals by rotation  Recommended blood sugar levels on waking up is 90-130 and about 2 hours after meal is 130-160  Please bring your blood sugar monitor to each visit, thank you

## 2017-10-29 NOTE — Progress Notes (Signed)
[name], male   DOB: [birth year], 62 y.o.   MRN: [id number]           Reason for Appointment:  Follow-up for Type 2 Diabetes   History of Present Illness:          Date of diagnosis of type 2 diabetes mellitus:        Background history:   He thinks his blood sugars at the time of diagnosis was around 500 and he was symptomatic with increased thirst and urination He has been taking metformin for several years but also has been tried on Amaryl Since about 2013 he has been on Januvia In early 2016 his blood sugars were significantly higher with A1c 11.9 and he was started on Lantus insulin, initially 10 units Janumet was continued Prior to consultation his A1c had gone up to 9%  Recent history:   Non-insulin hypoglycemic drugs the patient is taking are:  Invokana 300 mg daily  Victoza 1.8 mg daily, glipizide ER 2.5 mg daily   INSULIN regimen is: Levemir 12 units daily  His A1c was 7.3  Fructosamine is 334  Current management, blood sugar patterns and problems identified:  Recently on his last visit he was having periodically high readingsand more so consistently in the mornings  For this reason he was started back on basal insulin with LEVEMIR with 6 units and advised him to titrate this based on his morning blood sugars  However even though he was told to continue increasing the dose he stopped increasing the dose at 12 units which was the prescription amount  He is however checking his blood sugars somewhat erratically and most of his morning sugars are still over 150  He has not been checking readings after lunch or dinner  Although he is trying to do some walking he is not doing any formal exercise  He says that because of various family issues he has not been running his meals and eating poorly causing weight gain  He continues to be regular with his Victoza and Invokana        Side effects from medications have been: Diarrhea from high dose  metformin  Compliance with the medical regimen: Inconsistent  Glucose monitoring:  done 1-2 times a day         Glucometer:  Accu-Chek Blood Glucose readings by download  Mean values apply above for all meters except median for One Touch  PRE-MEAL Fasting Lunch Dinner Bedtime Overall  Glucose range:  147-274      Mean/median:  179    179   POST-MEAL PC Breakfast PC Lunch PC Dinner  Glucose range:   ?  Mean/median:        Self-care: The diet that the patient has been following is: tries to limit high-fat foods, carbohydrates .     Meal times are:  Breakfast is at 6 AM, Lunch: 12-1 PM Dinner: 6-7 PM    Typical meal intake: Breakfast is usually cereal or oatmeal on weekdays at the cafeteria, usually taking lunch from home to work               Dietician visit, most recent: 11/2015               Exercise:   walking at work   Weight history:   Wt Readings from Last 3 Encounters:  10/29/17 189 lb (85.7 kg)  10/11/17 181 lb 9.6 oz (82.4 kg)  08/31/17 187 lb (84.8 kg)  Glycemic control:   Lab Results  Component Value Date   HGBA1C 7.3 (H) 08/22/2017   HGBA1C 7.6 (H) 04/09/2017   HGBA1C 7.5 (H) 01/05/2017   Lab Results  Component Value Date   MICROALBUR <0.7 08/22/2017   LDLCALC 52 04/27/2017   CREATININE 0.85 10/26/2017    Lab on 10/26/2017  Component Date Value Ref Range Status  . Sodium 10/26/2017 135  135 - 145 mEq/L Final  . Potassium 10/26/2017 4.2  3.5 - 5.1 mEq/L Final  . Chloride 10/26/2017 101  96 - 112 mEq/L Final  . CO2 10/26/2017 25  19 - 32 mEq/L Final  . Glucose, Bld 10/26/2017 161* 70 - 99 mg/dL Final  . BUN 10/26/2017 14  6 - 23 mg/dL Final  . Creatinine, Ser 10/26/2017 0.85  0.40 - 1.50 mg/dL Final  . Calcium 10/26/2017 9.5  8.4 - 10.5 mg/dL Final  . GFR 10/26/2017 97.90  >60.00 mL/min Final  . Fructosamine 10/26/2017 334* 0 - 285 umol/L Final   Comment: Published reference interval for apparently healthy subjects between age 73 and 76 is 72  - 285 umol/L and in a poorly controlled diabetic population is 228 - 563 umol/L with a mean of 396 umol/L.        Allergies as of 10/29/2017      Reactions   Contrast Media [iodinated Diagnostic Agents] Swelling   Can take if premedicated with diphenhydramine   Iodine Swelling   Metformin And Related Diarrhea      Medication List        Accurate as of 10/29/17 12:40 PM. Always use your most recent med list.          ACCU-CHEK GUIDE test strip Generic drug:  glucose blood USE TO TEST BLOOD SUGAR DAILY AT VARIABLE TIMES   aspirin EC 81 MG tablet Take 1 tablet (81 mg total) by mouth daily.   atorvastatin 40 MG tablet Commonly known as:  LIPITOR TAKE 1 TABLET BY MOUTH DAILY   canagliflozin 300 MG Tabs tablet Commonly known as:  INVOKANA Take 1 tablet (300 mg total) by mouth daily before breakfast.   Insulin Detemir 100 UNIT/ML Pen Commonly known as:  LEVEMIR FLEXTOUCH Inject 12 Units into the skin daily at 10 pm.   lisinopril 5 MG tablet Commonly known as:  PRINIVIL,ZESTRIL Take 1 tablet (5 mg total) by mouth daily.   loperamide 2 MG tablet Commonly known as:  IMODIUM A-D Take 2 mg by mouth 4 (four) times daily as needed for diarrhea or loose stools.   MULTIPLE VITAMIN PO Take 1 packet by mouth daily.   sodium chloride 0.65 % Soln nasal spray Commonly known as:  OCEAN Place 1 spray into both nostrils as needed for congestion.   Turmeric Curcumin 500 MG Caps Take 1 capsule by mouth daily.   VICTOZA 18 MG/3ML Sopn Generic drug:  liraglutide INJECT 0.3 MLS (1.8 MG TOTAL) INTO THE SKIN DAILY.   zolpidem 10 MG tablet Commonly known as:  AMBIEN Take 1 tablet (10 mg total) by mouth at bedtime as needed for sleep.       Allergies:  Allergies  Allergen Reactions  . Contrast Media [Iodinated Diagnostic Agents] Swelling    Can take if premedicated with diphenhydramine  . Iodine Swelling  . Metformin And Related Diarrhea    Past Medical History:    Diagnosis Date  . Colon polyps    adenomatous  . Diverticulosis 10/26/2013  . DM (diabetes mellitus), type 2 with neurological complications (  [place]) 02/09/2012  . 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 History of chicken pox    childhood  . History of kidney stones   . Hyperlipidemia 07/26/2014  . Hypertension   . Insomnia 02/25/2013  . Preventative health care 07/26/2014  . Renal lithiasis 02/21/2015  . Sciatica of right side 12/22/2012  . Seasonal allergies   . 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 Trigger finger 05/03/2017    Past Surgical History:  Procedure Laterality Date  . ABDOMINAL HERNIA REPAIR    . CHOLECYSTECTOMY    . HERNIA REPAIR    . LAPAROSCOPIC APPENDECTOMY N/A 08/16/2014   Procedure: APPENDECTOMY LAPAROSCOPIC;  Surgeon: Erroll Luna, MD;  Location: [facility];  Service: General;  Laterality: N/A;  . MASS EXCISION  05/22/2012   Procedure: EXCISION MASS;  Surgeon: Joyice Faster. Cornett, MD;  Location: [facility];  Service: General;  Laterality: Left;  excision abdominal wall mass  . ROTATOR CUFF REPAIR      Family History  Problem Relation Age of Onset  . Lung cancer Mother   . Diabetes Maternal Aunt   . Cancer Paternal Grandmother   . Breast cancer Neg Hx   . Colon cancer Neg Hx   . Prostate cancer Neg Hx   . Heart disease Neg Hx     Social History:  reports that  has never smoked. he has never used smokeless tobacco. He reports that he does not drink alcohol or use drugs.    Review of Systems    Lipid history: On long-term treatment with statin, on 40 mg Lipitor, Followed by PCP   Lab Results  Component Value Date   CHOL 115 04/27/2017   HDL 45.70 04/27/2017   LDLCALC 52 04/27/2017   LDLDIRECT 109.0 10/26/2014   TRIG 91.0 04/27/2017   CHOLHDL 3 04/27/2017           Hypertension: Blood pressure appears to be higher now has been followed by PCP He says that he is under more  stress   BP Readings from Last 3 Encounters:  10/29/17 140/90  10/11/17 120/84  08/31/17 120/82     Most recent eye exam was 1/17  Most recent foot exam: 2/17    Review of Systems    LABS:  Lab on 10/26/2017  Component Date Value Ref Range Status  . Sodium 10/26/2017 135  135 - 145 mEq/L Final  . Potassium 10/26/2017 4.2  3.5 - 5.1 mEq/L Final  . Chloride 10/26/2017 101  96 - 112 mEq/L Final  . CO2 10/26/2017 25  19 - 32 mEq/L Final  . Glucose, Bld 10/26/2017 161* 70 - 99 mg/dL Final  . BUN 10/26/2017 14  6 - 23 mg/dL Final  . Creatinine, Ser 10/26/2017 0.85  0.40 - 1.50 mg/dL Final  . Calcium 10/26/2017 9.5  8.4 - 10.5 mg/dL Final  . GFR 10/26/2017 97.90  >60.00 mL/min Final  . Fructosamine 10/26/2017 334* 0 - 285 umol/L Final   Comment: Published reference interval for apparently healthy subjects between age 4 and 33 is 5 - 285 umol/L and in a poorly controlled diabetic population is 228 - 563 umol/L with a mean of 396 umol/L.     Physical Examination:  BP 140/90 (BP Location: Left Arm, Patient Position: Sitting, Cuff Size: Normal)   Pulse 92   Wt 189 lb (85.7 kg)   SpO2 98%   BMI 25.63 kg/m     ASSESSMENT:  Diabetes type 2,  with normal  BMI See history of present illness for detailed discussion of current diabetes management, blood sugar patterns and problems identified  His blood sugars are still running high with fructosamine 334  His blood sugars are not controlled partly because of poor diet, no formal exercise and probably some progression of his diabetes now Even though he has been started on basal insulin the current dose of 12 units does not appear to be adequate   PLAN:    Start titrating BASAL insulin again and start with 16 units from tonight He will need to adjust this based on blood sugars every 3 days and given him a flowsheet to use He should target his morning sugar to at least under 130  He thinks he can start watching his  diet better and discussed importance of doing this Also need to check readings after evening meal to help assess the need for mealtime coverage No change in the toes on Invokana  Patient Instructions  Levemir 16 units and keep going up, am target 120-130  Send #s of bedtime sugars in 1 week  Check blood sugars on waking up daily   Also check blood sugars about 2 hours after a meal and do this after different meals by rotation  Recommended blood sugar levels on waking up is 90-130 and about 2 hours after meal is 130-160  Please bring your blood sugar monitor to each visit, thank you            Elayne Snare 10/29/2017, 12:40 PM   Note: This office note was prepared with Dragon voice recognition system technology. Any transcriptional errors that result from this process are unintentional.

## 2017-10-30 ENCOUNTER — Ambulatory Visit: Payer: 59 | Admitting: Family Medicine

## 2017-10-30 ENCOUNTER — Encounter: Payer: Self-pay | Admitting: Family Medicine

## 2017-10-30 ENCOUNTER — Other Ambulatory Visit: Payer: Self-pay | Admitting: Family Medicine

## 2017-10-30 VITALS — BP 136/76 | HR 100 | Temp 98.2°F | Resp 16 | Wt 189.4 lb

## 2017-10-30 DIAGNOSIS — M5431 Sciatica, right side: Secondary | ICD-10-CM

## 2017-10-30 DIAGNOSIS — E782 Mixed hyperlipidemia: Secondary | ICD-10-CM | POA: Diagnosis not present

## 2017-10-30 DIAGNOSIS — I10 Essential (primary) hypertension: Secondary | ICD-10-CM

## 2017-10-30 DIAGNOSIS — G4700 Insomnia, unspecified: Secondary | ICD-10-CM

## 2017-10-30 DIAGNOSIS — M62838 Other muscle spasm: Secondary | ICD-10-CM | POA: Diagnosis not present

## 2017-10-30 DIAGNOSIS — E1149 Type 2 diabetes mellitus with other diabetic neurological complication: Secondary | ICD-10-CM | POA: Diagnosis not present

## 2017-10-30 LAB — MAGNESIUM: Magnesium: 2.2 mg/dL (ref 1.5–2.5)

## 2017-10-30 LAB — TSH: TSH: 1.88 u[IU]/mL (ref 0.35–4.50)

## 2017-10-30 MED ORDER — AMITRIPTYLINE HCL 10 MG PO TABS: 10.0000 mg | ORAL_TABLET | Freq: Every day | ORAL | 1 refills | Status: DC

## 2017-10-30 MED ORDER — TIZANIDINE HCL 4 MG PO TABS: 2.0000 mg | ORAL_TABLET | Freq: Two times a day (BID) | ORAL | 1 refills | Status: DC | PRN

## 2017-10-30 MED FILL — AMITRIPTYLINE HCL 10 MG TAB: 10 | 30 days supply | Qty: 60 | Fill #0

## 2017-10-30 MED FILL — INVOKANA 300 MG TABLET: 300 | 30 days supply | Qty: 30 | Fill #2

## 2017-10-30 MED FILL — tiZANidine HCL 4 MG TABS: 4 | 15 days supply | Qty: 30 | Fill #0

## 2017-10-30 MED FILL — VICTOZA 18 MG/3 ML INJECT P: 18 | 30 days supply | Qty: 9 | Fill #1

## 2017-10-30 NOTE — Assessment & Plan Note (Signed)
Well controlled, no changes to meds. Encouraged heart healthy diet such as the DASH diet and exercise as tolerated.  °

## 2017-10-30 NOTE — Assessment & Plan Note (Signed)
Encouraged good sleep hygiene such as dark, quiet room. No blue/green glowing lights such as computer screens in bedroom. No alcohol or stimulants in evening. Cut down on caffeine as able. Regular exercise is helpful but not just prior to bed time.  

## 2017-10-30 NOTE — Assessment & Plan Note (Signed)
Following with endocrinology Levemir just increased to 16 units numbers improving.

## 2017-10-30 NOTE — Assessment & Plan Note (Signed)
Encouraged moist heat and gentle stretching as tolerated. May try NSAIDs and prescription meds as directed and report if symptoms worsen or seek immediate care 

## 2017-10-30 NOTE — Progress Notes (Signed)
Subjective:  I acted as a Education administrator for [employer]. Albert [name], [place]   [name], male    DOB: 28-Nov-1955, 62 y.o.   MRN: [id number]  Chief Complaint  Patient presents with  . Hyperlipidemia    HPI  Patient is in today for follow up visit on hyperlipidemia, diabetes and insomnia. 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 No recent febrile illness or hospitalizations. Notes some recent increased trouble with back pain. No falls or new injury but he does note he does strenuous work. Denies CP/palp/SOB/HA/congestion/fevers/GI or GU c/o. Taking meds as prescribed  Patient Care Team: Mosie Lukes, MD as PCP - General (Family Medicine)   Past Medical History:  Diagnosis Date  . Colon polyps    adenomatous  . Diverticulosis 10/26/2013  . DM (diabetes mellitus), type 2 with neurological complications ([place]) 11/20/8117  . 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 History of chicken pox    childhood  . History of kidney stones   . Hyperlipidemia 07/26/2014  . Hypertension   . Insomnia 02/25/2013  . Preventative health care 07/26/2014  . Renal lithiasis 02/21/2015  . Sciatica of right side 12/22/2012  . Seasonal allergies   . 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 Trigger finger 05/03/2017    Past Surgical History:  Procedure Laterality Date  . ABDOMINAL HERNIA REPAIR    . CHOLECYSTECTOMY    . HERNIA REPAIR    . LAPAROSCOPIC APPENDECTOMY N/A 08/16/2014   Procedure: APPENDECTOMY LAPAROSCOPIC;  Surgeon: Erroll Luna, MD;  Location: [facility];  Service: General;  Laterality: N/A;  . MASS EXCISION  05/22/2012   Procedure: EXCISION MASS;  Surgeon: Joyice Faster. Cornett, MD;  Location: [facility];  Service: General;  Laterality: Left;  excision abdominal wall mass  . ROTATOR CUFF REPAIR      Family History  Problem Relation Age of Onset  . Lung  cancer Mother   . Diabetes Maternal Aunt   . Cancer Paternal Grandmother   . Breast cancer Neg Hx   . Colon cancer Neg Hx   . Prostate cancer Neg Hx   . Heart disease Neg Hx     Social History   Socioeconomic History  . Marital status: Married    Spouse name: Not on file  . Number of children: 3  . Years of education: Not on file  . Highest education level: Not on file  Social Needs  . Financial resource strain: Not on file  . Food insecurity - worry: Not on file  . Food insecurity - inability: Not on file  . Transportation needs - medical: Not on file  . Transportation needs - non-medical: Not on file  Occupational History    Comment: Animal nutritionist [employer]  Tobacco Use  . Smoking status: Never Smoker  . Smokeless tobacco: Never Used  Substance and Sexual Activity  . Alcohol use: No  . Drug use: No  . Sexual activity: Yes  Other Topics Concern  . Not on file  Social History Narrative  . Not on file    Outpatient Medications Prior to Visit  Medication Sig Dispense Refill  . ACCU-CHEK GUIDE test strip USE TO TEST BLOOD SUGAR DAILY AT VARIABLE TIMES 50 each 4  . aspirin EC 81 MG tablet Take 1 tablet (81 mg total) by mouth daily.    Marland Kitchen atorvastatin (LIPITOR) 40  MG tablet TAKE 1 TABLET BY MOUTH DAILY 90 tablet 0  . canagliflozin (INVOKANA) 300 MG TABS tablet Take 1 tablet (300 mg total) by mouth daily before breakfast. 30 tablet 3  . Insulin Detemir (LEVEMIR FLEXTOUCH) 100 UNIT/ML Pen Inject 16 Units into the skin daily at 10 pm. 15 mL 1  . lisinopril (PRINIVIL,ZESTRIL) 5 MG tablet Take 1 tablet (5 mg total) by mouth daily. 90 tablet 3  . loperamide (IMODIUM A-D) 2 MG tablet Take 2 mg by mouth 4 (four) times daily as needed for diarrhea or loose stools.    . MULTIPLE VITAMIN PO Take 1 packet by mouth daily.    . sodium chloride (OCEAN) 0.65 % SOLN nasal spray Place 1 spray into both nostrils as needed for congestion.    . Turmeric Curcumin 500 MG CAPS Take 1 capsule by mouth  daily.    Marland Kitchen VICTOZA 18 MG/3ML SOPN INJECT 0.3 MLS (1.8 MG TOTAL) INTO THE SKIN DAILY. 9 mL 3  . Insulin Detemir (LEVEMIR FLEXTOUCH) 100 UNIT/ML Pen Inject 12 Units into the skin daily at 10 pm. 15 mL 1  . zolpidem (AMBIEN) 10 MG tablet Take 1 tablet (10 mg total) by mouth at bedtime as needed for sleep. 30 tablet 0   No facility-administered medications prior to visit.     Allergies  Allergen Reactions  . Contrast Media [Iodinated Diagnostic Agents] Swelling    Can take if premedicated with diphenhydramine  . Iodine Swelling  . Metformin And Related Diarrhea    Review of Systems  Constitutional: Negative for chills and fever.  HENT: Negative for congestion and hearing loss.   Eyes: Negative for discharge.  Respiratory: Negative for cough, sputum production and shortness of breath.   Cardiovascular: Negative for chest pain, palpitations and leg swelling.  Gastrointestinal: Negative for abdominal pain, blood in stool, constipation, diarrhea, heartburn, nausea and vomiting.  Genitourinary: Negative for dysuria, frequency, hematuria and urgency.  Musculoskeletal: Positive for back pain and joint pain. Negative for falls and myalgias.  Skin: Negative for rash.  Neurological: Negative for dizziness, sensory change, loss of consciousness, weakness and headaches.  Endo/Heme/Allergies: Negative for environmental allergies. Does not bruise/bleed easily.  Psychiatric/Behavioral: Negative for depression and suicidal ideas. The patient has insomnia. The patient is not nervous/anxious.        Objective:    Physical Exam  Constitutional: He is oriented to person, place, and time. He appears well-developed and well-nourished. No distress.  HENT:  Head: Normocephalic and atraumatic.  Nose: Nose normal.  Eyes: Right eye exhibits no discharge. Left eye exhibits no discharge.  Neck: Normal range of motion. Neck supple.  Cardiovascular: Normal rate and regular rhythm.  No murmur  heard. Pulmonary/Chest: Effort normal and breath sounds normal.  Abdominal: Soft. Bowel sounds are normal. There is no tenderness.  Musculoskeletal: He exhibits no edema.  Neurological: He is alert and oriented to person, place, and time.  Skin: Skin is warm and dry.  Psychiatric: He has a normal mood and affect.  Nursing note and vitals reviewed.   BP 136/76 (BP Location: Left Arm, Patient Position: Sitting, Cuff Size: Normal)   Pulse 100   Temp 98.2 F (36.8 C) (Oral)   Resp 16   Wt 189 lb 6.4 oz (85.9 kg)   SpO2 97%   BMI 25.69 kg/m  Wt Readings from Last 3 Encounters:  10/30/17 189 lb 6.4 oz (85.9 kg)  10/29/17 189 lb (85.7 kg)  10/11/17 181 lb 9.6 oz (82.4 kg)  BP Readings from Last 3 Encounters:  10/30/17 136/76  10/29/17 140/90  10/11/17 120/84     Immunization History  Administered Date(s) Administered  . Influenza,inj,Quad PF,6+ Mos 06/02/2013  . Influenza-Unspecified 05/05/2014, 05/02/2015, 05/08/2016  . Pneumococcal Conjugate-13 06/02/2013  . Pneumococcal Polysaccharide-23 09/07/2015  . Pneumococcal-Unspecified 08/16/2009  . Tdap 08/22/2010    Health Maintenance  Topic Date Due  . OPHTHALMOLOGY EXAM  09/02/2016  . FOOT EXAM  10/06/2016  . HEMOGLOBIN A1C  02/19/2018  . COLONOSCOPY  10/23/2018  . TETANUS/TDAP  08/22/2020  . PNEUMOCOCCAL POLYSACCHARIDE VACCINE  Completed  . INFLUENZA VACCINE  Completed  . Hepatitis C Screening  Completed  . HIV Screening  Completed    Lab Results  Component Value Date   WBC 7.4 05/09/2017   HGB 16.7 05/09/2017   HCT 50.3 05/09/2017   PLT 250.0 05/09/2017   GLUCOSE 161 (H) 10/26/2017   CHOL 115 04/27/2017   TRIG 91.0 04/27/2017   HDL 45.70 04/27/2017   LDLDIRECT 109.0 10/26/2014   LDLCALC 52 04/27/2017   ALT 30 05/09/2017   AST 24 05/09/2017   NA 135 10/26/2017   K 4.2 10/26/2017   CL 101 10/26/2017   CREATININE 0.85 10/26/2017   BUN 14 10/26/2017   CO2 25 10/26/2017   TSH 1.88 10/30/2017   PSA 0.86  04/27/2017   INR 1.10 08/16/2014   HGBA1C 7.3 (H) 08/22/2017   MICROALBUR <0.7 08/22/2017    Lab Results  Component Value Date   TSH 1.88 10/30/2017   Lab Results  Component Value Date   WBC 7.4 05/09/2017   HGB 16.7 05/09/2017   HCT 50.3 05/09/2017   MCV 92.2 05/09/2017   PLT 250.0 05/09/2017   Lab Results  Component Value Date   NA 135 10/26/2017   K 4.2 10/26/2017   CO2 25 10/26/2017   GLUCOSE 161 (H) 10/26/2017   BUN 14 10/26/2017   CREATININE 0.85 10/26/2017   BILITOT 0.6 05/09/2017   ALKPHOS 74 05/09/2017   AST 24 05/09/2017   ALT 30 05/09/2017   PROT 8.3 05/09/2017   ALBUMIN 4.9 05/09/2017   CALCIUM 9.5 10/26/2017   ANIONGAP 11 01/31/2015   GFR 97.90 10/26/2017   Lab Results  Component Value Date   CHOL 115 04/27/2017   Lab Results  Component Value Date   HDL 45.70 04/27/2017   Lab Results  Component Value Date   LDLCALC 52 04/27/2017   Lab Results  Component Value Date   TRIG 91.0 04/27/2017   Lab Results  Component Value Date   CHOLHDL 3 04/27/2017   Lab Results  Component Value Date   HGBA1C 7.3 (H) 08/22/2017         Assessment & Plan:   Problem List Items Addressed This Visit    DM (diabetes mellitus), type 2 with neurological complications ([place])    Following with endocrinology Levemir just increased to 16 units numbers improving.      Relevant Medications   Insulin Detemir (LEVEMIR FLEXTOUCH) 100 UNIT/ML Pen   HTN (hypertension) - Primary    Well controlled, no changes to meds. Encouraged heart healthy diet such as the DASH diet and exercise as tolerated.       Relevant Orders   TSH (Completed)   Sciatica of right side    Encouraged moist heat and gentle stretching as tolerated. May try NSAIDs and prescription meds as directed and report if symptoms worsen or seek immediate care.      Relevant Medications   tiZANidine (  ZANAFLEX) 4 MG tablet   amitriptyline (ELAVIL) 10 MG tablet   Insomnia    Encouraged good sleep  hygiene such as dark, quiet room. No blue/green glowing lights such as computer screens in bedroom. No alcohol or stimulants in evening. Cut down on caffeine as able. Regular exercise is helpful but not just prior to bed time.       Hyperlipidemia, mixed    Encouraged heart healthy diet, increase exercise, avoid trans fats, consider a krill oil cap daily      Muscle spasm    Check labs, increase hydration      Relevant Medications   tiZANidine (ZANAFLEX) 4 MG tablet   Other Relevant Orders   Magnesium (Completed)      I have discontinued Reginia Forts. Cypert's zolpidem. I have also changed his Insulin Detemir. Additionally, I am having him start on tiZANidine and amitriptyline. Lastly, I am having him maintain his loperamide, aspirin EC, sodium chloride, lisinopril, Turmeric Curcumin, MULTIPLE VITAMIN PO, atorvastatin, ACCU-CHEK GUIDE, canagliflozin, and VICTOZA.  Meds ordered this encounter  Medications  . tiZANidine (ZANAFLEX) 4 MG tablet    Sig: Take 0.5-1 tablets (2-4 mg total) by mouth 2 (two) times daily as needed for muscle spasms.    Dispense:  30 tablet    Refill:  1  . amitriptyline (ELAVIL) 10 MG tablet    Sig: Take 1-2 tablets (10-20 mg total) by mouth at bedtime.    Dispense:  60 tablet    Refill:  1    CMA served as scribe during this visit. History, Physical and Plan performed by medical provider. Documentation and orders reviewed and attested to.  [name], MD

## 2017-10-30 NOTE — Patient Instructions (Signed)
Tylenol ES 500 mg tabs, 2 tabs twice daily for a week Advil/Ibuprofen 200 mg tabs, 2 tabs twice daily for a week Lidocaine patch .Encouraged moist heat and gentle stretching as tolerated. May try NSAIDs and prescription meds as directed and report if symptoms worsen or seek immediate care Sciatica Sciatica is pain, numbness, weakness, or tingling along your sciatic nerve. The sciatic nerve starts in the lower back and goes down the back of each leg. Sciatica happens when this nerve is pinched or has pressure put on it. Sciatica usually goes away on its own or with treatment. Sometimes, sciatica may keep coming back (recur). Follow these instructions at home: Medicines  Take over-the-counter and prescription medicines only as told by your doctor.  Do not drive or use heavy machinery while taking prescription pain medicine. Managing pain  If directed, put ice on the affected area. ? Put ice in a plastic bag. ? Place a towel between your skin and the bag. ? Leave the ice on for 20 minutes, 2-3 times a day.  After icing, apply heat to the affected area before you exercise or as often as told by your doctor. Use the heat source that your doctor tells you to use, such as a moist heat pack or a heating pad. ? Place a towel between your skin and the heat source. ? Leave the heat on for 20-30 minutes. ? Remove the heat if your skin turns bright red. This is especially important if you are unable to feel pain, heat, or cold. You may have a greater risk of getting burned. Activity  Return to your normal activities as told by your doctor. Ask your doctor what activities are safe for you. ? Avoid activities that make your sciatica worse.  Take short rests during the day. Rest in a lying or standing position. This is usually better than sitting to rest. ? When you rest for a long time, do some physical activity or stretching between periods of rest. ? Avoid sitting for a long time without moving. Get  up and move around at least one time each hour.  Exercise and stretch regularly, as told by your doctor.  Do not lift anything that is heavier than 10 lb (4.5 kg) while you have symptoms of sciatica. ? Avoid lifting heavy things even when you do not have symptoms. ? Avoid lifting heavy things over and over.  When you lift objects, always lift in a way that is safe for your body. To do this, you should: ? Bend your knees. ? Keep the object close to your body. ? Avoid twisting. General instructions  Use good posture. ? Avoid leaning forward when you are sitting. ? Avoid hunching over when you are standing.  Stay at a healthy weight.  Wear comfortable shoes that support your feet. Avoid wearing high heels.  Avoid sleeping on a mattress that is too soft or too hard. You might have less pain if you sleep on a mattress that is firm enough to support your back.  Keep all follow-up visits as told by your doctor. This is important. Contact a doctor if:  You have pain that: ? Wakes you up when you are sleeping. ? Gets worse when you lie down. ? Is worse than the pain you have had in the past. ? Lasts longer than 4 weeks.  You lose weight for without trying. Get help right away if:  You cannot control when you pee (urinate) or poop (have a bowel movement).  You have weakness in any of these areas and it gets worse. ? Lower back. ? Lower belly (pelvis). ? Butt (buttocks). ? Legs.  You have redness or swelling of your back.  You have a burning feeling when you pee. This information is not intended to replace advice given to you by your health care provider. Make sure you discuss any questions you have with your health care provider. Document Released: 05/09/2008 Document Revised: 01/06/2016 Document Reviewed: 04/09/2015 Elsevier Interactive Patient Education  [employer].

## 2017-10-30 NOTE — Assessment & Plan Note (Signed)
Encouraged heart healthy diet, increase exercise, avoid trans fats, consider a krill oil cap daily 

## 2017-10-31 ENCOUNTER — Other Ambulatory Visit: Payer: Self-pay

## 2017-10-31 DIAGNOSIS — M62838 Other muscle spasm: Secondary | ICD-10-CM | POA: Insufficient documentation

## 2017-10-31 MED ORDER — INSULIN PEN NEEDLE 31G X 8 MM MISC: 2 refills | Status: DC

## 2017-10-31 NOTE — Assessment & Plan Note (Signed)
Check labs, increase hydration

## 2017-11-01 MED FILL — UNIFINE PENTIPS 31GX3/16": 31G X 5 MM | 90 days supply | Qty: 300 | Fill #0

## 2017-11-01 MED FILL — UNIFINE PENTIPS 31GX3/16: 31G X 5 MM | 90 days supply | Qty: 300 | Fill #0

## 2017-11-09 MED FILL — LEVEMIR FLEXTOUCH 100 UNITS: 100 | 25 days supply | Qty: 3 | Fill #2

## 2017-11-13 ENCOUNTER — Encounter: Payer: Self-pay | Admitting: Medical

## 2017-11-13 ENCOUNTER — Ambulatory Visit: Payer: 59 | Admitting: Medical

## 2017-11-13 VITALS — BP 130/69 | HR 92 | Temp 97.9°F | Resp 16 | Ht 72.0 in | Wt 188.8 lb

## 2017-11-13 DIAGNOSIS — R05 Cough: Secondary | ICD-10-CM

## 2017-11-13 DIAGNOSIS — R059 Cough, unspecified: Secondary | ICD-10-CM

## 2017-11-13 DIAGNOSIS — J111 Influenza due to unidentified influenza virus with other respiratory manifestations: Secondary | ICD-10-CM

## 2017-11-13 DIAGNOSIS — R5383 Other fatigue: Secondary | ICD-10-CM | POA: Diagnosis not present

## 2017-11-13 DIAGNOSIS — J3489 Other specified disorders of nose and nasal sinuses: Secondary | ICD-10-CM

## 2017-11-13 DIAGNOSIS — M791 Myalgia, unspecified site: Secondary | ICD-10-CM

## 2017-11-13 MED ORDER — AZITHROMYCIN 250 MG PO TABS: ORAL_TABLET | ORAL | 0 refills | Status: DC

## 2017-11-13 MED ORDER — OSELTAMIVIR PHOSPHATE 75 MG PO CAPS: 75.0000 mg | ORAL_CAPSULE | Freq: Two times a day (BID) | ORAL | 0 refills | Status: DC

## 2017-11-13 MED ORDER — FLUTICASONE PROPIONATE 50 MCG/ACT NA SUSP: 2.0000 | Freq: Every day | NASAL | 1 refills | Status: AC

## 2017-11-13 MED ORDER — BENZONATATE 100 MG PO CAPS: 100.0000 mg | ORAL_CAPSULE | Freq: Three times a day (TID) | ORAL | 0 refills | Status: DC | PRN

## 2017-11-13 MED FILL — FLUTICASONE PROP 50 MCG SPR: 50 | 30 days supply | Qty: 16 | Fill #0

## 2017-11-13 MED FILL — AZITHROMYCIN 250 MG TABLET: 250 | 5 days supply | Qty: 6 | Fill #0

## 2017-11-13 MED FILL — BENZONATATE 100 MG CAPSULE: 100 | 10 days supply | Qty: 30 | Fill #0

## 2017-11-13 MED FILL — OSELTAMIVIR PHOSPHATE 75 MG: 75 | 5 days supply | Qty: 10 | Fill #0

## 2017-11-13 NOTE — Patient Instructions (Signed)
You appear to have the flu even though your  rapid flu test was negative.(Important to note sometimes flu test can be falsely negative.) Therefore,  I am treating you with tamiflu based on your clinical presentation. Rest, hydrate and take tylenol for fever. Alternate ibuprofen  if necessary for body ache or fever. You should gradually improve but  if you develop secondary bacterial infection signs and symptoms then start azithromycin. Some sinus pressure presently and this may indicate early sinus infection.  For cough rx benzonatate.  For nasal congestion rx flonase.  Work note to return on Monday.  If signs and symptoms worsen or change despite the above and severe then return but if after hours then ED evaluation.  Follow up in 7 days or as needed

## 2017-11-13 NOTE — Progress Notes (Signed)
Subjective:    [name], male    DOB: 08-08-1956, 62 y.o.   MRN: [id number]  HPI  On Sunday got dry cough,  nasal congestion, ha, dizziness, fatigue, fever, chills, and body aches.(acute onset) Symptoms started on Sunday and getting worse. Wife had flu last week. Pt works [place] at [employer].  Pt had flu vaccine in october  Bodyaches are diffuse and severe per pt.   Review of Systems  Constitutional: Positive for fatigue and fever. Negative for chills.  HENT: Positive for congestion and sinus pressure. Negative for ear pain, nosebleeds, rhinorrhea, sneezing and sore throat.   Respiratory: Positive for cough. Negative for chest tightness, shortness of breath and wheezing.   Cardiovascular: Negative for chest pain and palpitations.  Gastrointestinal: Negative for abdominal pain.  Musculoskeletal: Positive for myalgias. Negative for back pain.  Skin: Negative for rash.  Neurological: Negative for dizziness, speech difficulty, weakness, numbness and headaches.  Hematological: Negative for adenopathy. Does not bruise/bleed easily.  Psychiatric/Behavioral: Negative for behavioral problems, confusion and suicidal ideas. The patient is not nervous/anxious.     Past Medical History:  Diagnosis Date  . Colon polyps    adenomatous  . Diverticulosis 10/26/2013  . DM (diabetes mellitus), type 2 with neurological complications ([place]) 5/64/3329  . 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 History of chicken pox    childhood  . History of kidney stones   . Hyperlipidemia 07/26/2014  . Hypertension   . Insomnia 02/25/2013  . Preventative health care 07/26/2014  . Renal lithiasis 02/21/2015  . Sciatica of right side 12/22/2012  . Seasonal allergies   . 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 Trigger finger 05/03/2017     Social History   Socioeconomic History  . Marital status: Married    Spouse name: Not on file  .  Number of children: 3  . Years of education: Not on file  . Highest education level: Not on file  Occupational History    Comment: Animal nutritionist [employer]  Social Needs  . Financial resource strain: Not on file  . Food insecurity:    Worry: Not on file    Inability: Not on file  . Transportation needs:    Medical: Not on file    Non-medical: Not on file  Tobacco Use  . Smoking status: Never Smoker  . Smokeless tobacco: Never Used  Substance and Sexual Activity  . Alcohol use: No  . Drug use: No  . Sexual activity: Yes  Lifestyle  . Physical activity:    Days per week: Not on file    Minutes per session: Not on file  . Stress: Not on file  Relationships  . Social connections:    Talks on phone: Not on file    Gets together: Not on file    Attends religious service: Not on file    Active member of club or organization: Not on file    Attends meetings of clubs or organizations: Not on file    Relationship status: Not on file  . Intimate partner violence:    Fear of current or ex partner: Not on file    Emotionally abused: Not on file    Physically abused: Not on file    Forced sexual activity: Not on file  Other Topics Concern  . Not on file  Social History Narrative  . Not on file    Past Surgical History:  Procedure  Laterality Date  . ABDOMINAL HERNIA REPAIR    . CHOLECYSTECTOMY    . HERNIA REPAIR    . LAPAROSCOPIC APPENDECTOMY N/A 08/16/2014   Procedure: APPENDECTOMY LAPAROSCOPIC;  Surgeon: Erroll Luna, MD;  Location: [facility];  Service: General;  Laterality: N/A;  . MASS EXCISION  05/22/2012   Procedure: EXCISION MASS;  Surgeon: Joyice Faster. Cornett, MD;  Location: [facility];  Service: General;  Laterality: Left;  excision abdominal wall mass  . ROTATOR CUFF REPAIR      Family History  Problem Relation Age of Onset  . Lung cancer Mother   . Diabetes Maternal Aunt   . Cancer Paternal Grandmother   . Breast cancer Neg Hx   . Colon cancer Neg Hx   .  Prostate cancer Neg Hx   . Heart disease Neg Hx     Allergies  Allergen Reactions  . Contrast Media [Iodinated Diagnostic Agents] Swelling    Can take if premedicated with diphenhydramine  . Iodine Swelling  . Metformin And Related Diarrhea    Current Outpatient Medications on File Prior to Visit  Medication Sig Dispense Refill  . ACCU-CHEK GUIDE test strip USE TO TEST BLOOD SUGAR DAILY AT VARIABLE TIMES 50 each 4  . amitriptyline (ELAVIL) 10 MG tablet Take 1-2 tablets (10-20 mg total) by mouth at bedtime. 60 tablet 1  . aspirin EC 81 MG tablet Take 1 tablet (81 mg total) by mouth daily.    Marland Kitchen atorvastatin (LIPITOR) 40 MG tablet TAKE 1 TABLET BY MOUTH DAILY 90 tablet 0  . canagliflozin (INVOKANA) 300 MG TABS tablet Take 1 tablet (300 mg total) by mouth daily before breakfast. 30 tablet 3  . Insulin Detemir (LEVEMIR FLEXTOUCH) 100 UNIT/ML Pen Inject 16 Units into the skin daily at 10 pm. 15 mL 1  . Insulin Pen Needle 31G X 8 MM MISC Use to inject medication 3 times per day. 300 each 2  . lisinopril (PRINIVIL,ZESTRIL) 5 MG tablet Take 1 tablet (5 mg total) by mouth daily. 90 tablet 3  . loperamide (IMODIUM A-D) 2 MG tablet Take 2 mg by mouth 4 (four) times daily as needed for diarrhea or loose stools.    . MULTIPLE VITAMIN PO Take 1 packet by mouth daily.    . sodium chloride (OCEAN) 0.65 % SOLN nasal spray Place 1 spray into both nostrils as needed for congestion.    Marland Kitchen tiZANidine (ZANAFLEX) 4 MG tablet Take 0.5-1 tablets (2-4 mg total) by mouth 2 (two) times daily as needed for muscle spasms. 30 tablet 1  . Turmeric Curcumin 500 MG CAPS Take 1 capsule by mouth daily.    Marland Kitchen VICTOZA 18 MG/3ML SOPN INJECT 0.3 MLS (1.8 MG TOTAL) INTO THE SKIN DAILY. 9 mL 3  . [DISCONTINUED] SitaGLIPtin-MetFORMIN HCl (JANUMET XR) [phone] MG TB24 TAKE 1 TABLET BY MOUTH AFTER SUPPER 90 tablet 1   No current facility-administered medications on file prior to visit.     BP 130/69   Pulse 92   Temp 97.9 F  (36.6 C) (Oral)   Resp 16   Ht 6' (1.829 m)   Wt 188 lb 12.8 oz (85.6 kg)   SpO2 99%   BMI 25.61 kg/m       Objective:   Physical Exam   General  Mental Status - Alert. General Appearance - Well groomed. Not in acute distress.  Skin Rashes- No Rashes.  HEENT Head- Normal. Ear Auditory Canal - Left- Normal. Right - Normal.Tympanic Membrane- Left- Normal.  Right- Normal. Eye Sclera/Conjunctiva- Left- Normal. Right- Normal. Nose & Sinuses Nasal Mucosa- Left-  Boggy and Congested. Right-  Boggy and  Congested.Bilateral maxillary and frontal sinus pressure. Mouth & Throat Lips: Upper Lip- Normal: no dryness, cracking, pallor, cyanosis, or vesicular eruption. Lower Lip-Normal: no dryness, cracking, pallor, cyanosis or vesicular eruption. Buccal Mucosa- Bilateral- No Aphthous ulcers. Oropharynx- No Discharge or Erythema. Tonsils: Characteristics- Bilateral- No Erythema or Congestion. Size/Enlargement- Bilateral- No enlargement. Discharge- bilateral-None.  Neck Neck- Supple. No Masses.   Chest and Lung Exam Auscultation: Breath Sounds:-Clear even and unlabored.  Cardiovascular Auscultation:Rythm- Regular, rate and rhythm. Murmurs & Other Heart Sounds:Ausculatation of the heart reveal- No Murmurs.  Lymphatic Head & Neck General Head & Neck Lymphatics: Bilateral: Description- No Localized lymphadenopathy.     Assessment & Plan:  You appear to have the flu even though your  rapid flu test was negative.(Important to note sometimes flu test can be falsely negative.) Therefore,  I am treating you with tamiflu based on your clinical presentation. Rest, hydrate and take tylenol for fever. Alternate ibuprofen  if necessary for body ache or fever. You should gradually improve but  if you develop secondary bacterial infection signs and symptoms then start azithromycin. Some sinus pressure presently and this may indicate early sinus infection.  For cough rx benzonatate.  For nasal  congestion rx flonase.  Work note to return on Monday.  If signs and symptoms worsen or change despite the above and severe then return but if after hours then ED evaluation.  Follow up in 7 days or as needed  [employer], [employer]

## 2017-11-15 MED FILL — ACCU-CHEK GUIDE TEST STRIP: 30 days supply | Qty: 50 | Fill #2

## 2017-11-22 MED FILL — tiZANidine HCL 4 MG TABS: 4 | 15 days supply | Qty: 30 | Fill #1

## 2017-11-27 MED FILL — AMITRIPTYLINE HCL 10 MG TAB: 10 | 30 days supply | Qty: 60 | Fill #1

## 2017-11-27 MED FILL — INVOKANA 300 MG TABLET: 300 | 30 days supply | Qty: 30 | Fill #3

## 2017-12-05 ENCOUNTER — Other Ambulatory Visit: Payer: Self-pay

## 2017-12-07 ENCOUNTER — Ambulatory Visit: Payer: Self-pay | Admitting: Endocrinology

## 2017-12-11 ENCOUNTER — Encounter: Payer: Self-pay | Admitting: Family Medicine

## 2017-12-11 ENCOUNTER — Ambulatory Visit: Payer: 59 | Admitting: Family Medicine

## 2017-12-11 VITALS — BP 146/83 | Temp 99.0°F | Resp 16 | Ht 72.05 in | Wt 187.0 lb

## 2017-12-11 DIAGNOSIS — E782 Mixed hyperlipidemia: Secondary | ICD-10-CM

## 2017-12-11 DIAGNOSIS — I10 Essential (primary) hypertension: Secondary | ICD-10-CM | POA: Diagnosis not present

## 2017-12-11 DIAGNOSIS — M62838 Other muscle spasm: Secondary | ICD-10-CM

## 2017-12-11 DIAGNOSIS — M5431 Sciatica, right side: Secondary | ICD-10-CM | POA: Diagnosis not present

## 2017-12-11 DIAGNOSIS — M65332 Trigger finger, left middle finger: Secondary | ICD-10-CM

## 2017-12-11 MED ORDER — AMITRIPTYLINE HCL 10 MG PO TABS: 10.0000 mg | ORAL_TABLET | Freq: Every day | ORAL | 1 refills | Status: DC

## 2017-12-11 MED ORDER — TIZANIDINE HCL 4 MG PO TABS: 2.0000 mg | ORAL_TABLET | Freq: Two times a day (BID) | ORAL | 2 refills | Status: DC | PRN

## 2017-12-11 MED FILL — tiZANidine HCL 4 MG TABS: 4 | 15 days supply | Qty: 30 | Fill #0

## 2017-12-11 NOTE — Progress Notes (Signed)
Subjective:  I acted as a Education administrator for Albert [name]. Albert [name], Albert [name]   [name], male    DOB: 06/04/1956, 62 y.o.   MRN: [id number]  Chief Complaint  Patient presents with  . Follow-up    HPI  Patient is in today for follow up visit snd he feels well. He continues to struggle with back pain and radicular symptoms. No recent fall or injury. No polyuria or polydipsia. Notes sugars have improved. Is following with endocrinology. Continues to have a stressful job and difficulty sleeping. Denies CP/palp/SOB/HA/congestion/fevers/GI or GU c/o. Taking meds as prescribed  Patient Care Team: Mosie Lukes, MD as PCP - General (Family Medicine)   Past Medical History:  Diagnosis Date  . Colon polyps    adenomatous  . Diverticulosis 10/26/2013  . DM (diabetes mellitus), type 2 with neurological complications ([place]) 0/05/9322  . 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 History of chicken pox    childhood  . History of kidney stones   . Hyperlipidemia 07/26/2014  . Hypertension   . Insomnia 02/25/2013  . Preventative health care 07/26/2014  . Renal lithiasis 02/21/2015  . Sciatica of right side 12/22/2012  . Seasonal allergies   . 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 Trigger finger 05/03/2017    Past Surgical History:  Procedure Laterality Date  . ABDOMINAL HERNIA REPAIR    . CHOLECYSTECTOMY    . HERNIA REPAIR    . LAPAROSCOPIC APPENDECTOMY N/A 08/16/2014   Procedure: APPENDECTOMY LAPAROSCOPIC;  Surgeon: Erroll Luna, MD;  Location: [facility];  Service: General;  Laterality: N/A;  . MASS EXCISION  05/22/2012   Procedure: EXCISION MASS;  Surgeon: Joyice Faster. Cornett, MD;  Location: [facility];  Service: General;  Laterality: Left;  excision abdominal wall mass  . ROTATOR CUFF REPAIR      Family History  Problem Relation Age of Onset  . Lung cancer Mother   . Diabetes Maternal Aunt   . Cancer Paternal  Grandmother   . Breast cancer Neg Hx   . Colon cancer Neg Hx   . Prostate cancer Neg Hx   . Heart disease Neg Hx     Social History   Socioeconomic History  . Marital status: Married    Spouse name: Not on file  . Number of children: 3  . Years of education: Not on file  . 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 Financial resource strain: Not on file  . Food insecurity:    Worry: Not on file    Inability: Not on file  . Transportation needs:    Medical: Not on file    Non-medical: Not on file  Tobacco Use  . Smoking status: Never Smoker  . Smokeless tobacco: Never Used  Substance and Sexual Activity  . Alcohol use: No  . Drug use: No  . Sexual activity: Yes  Lifestyle  . Physical activity:    Days per week: Not on file    Minutes per session: Not on file  . Stress: Not on file  Relationships  . Social connections:    Talks on phone: Not on file    Gets together: Not on file    Attends religious service: Not on file    Active member of club or organization: Not on file    Attends meetings of clubs or  organizations: Not on file    Relationship status: Not on file  . Intimate partner violence:    Fear of current or ex partner: Not on file    Emotionally abused: Not on file    Physically abused: Not on file    Forced sexual activity: Not on file  Other Topics Concern  . Not on file  Social History Narrative  . Not on file    Outpatient Medications Prior to Visit  Medication Sig Dispense Refill  . ACCU-CHEK GUIDE test strip USE TO TEST BLOOD SUGAR DAILY AT VARIABLE TIMES 50 each 4  . aspirin EC 81 MG tablet Take 1 tablet (81 mg total) by mouth daily.    Marland Kitchen atorvastatin (LIPITOR) 40 MG tablet TAKE 1 TABLET BY MOUTH DAILY 90 tablet 0  . canagliflozin (INVOKANA) 300 MG TABS tablet Take 1 tablet (300 mg total) by mouth daily before breakfast. 30 tablet 3  . fluticasone (FLONASE) 50 MCG/ACT nasal  spray Place 2 sprays into both nostrils daily. 16 g 1  . Insulin Detemir (LEVEMIR FLEXTOUCH) 100 UNIT/ML Pen Inject 16 Units into the skin daily at 10 pm. 15 mL 1  . Insulin Pen Needle 31G X 8 MM MISC Use to inject medication 3 times per day. 300 each 2  . lisinopril (PRINIVIL,ZESTRIL) 5 MG tablet Take 1 tablet (5 mg total) by mouth daily. 90 tablet 3  . loperamide (IMODIUM A-D) 2 MG tablet Take 2 mg by mouth 4 (four) times daily as needed for diarrhea or loose stools.    . MULTIPLE VITAMIN PO Take 1 packet by mouth daily.    . sodium chloride (OCEAN) 0.65 % SOLN nasal spray Place 1 spray into both nostrils as needed for congestion.    . Turmeric Curcumin 500 MG CAPS Take 1 capsule by mouth daily.    Marland Kitchen VICTOZA 18 MG/3ML SOPN INJECT 0.3 MLS (1.8 MG TOTAL) INTO THE SKIN DAILY. 9 mL 3  . amitriptyline (ELAVIL) 10 MG tablet Take 1-2 tablets (10-20 mg total) by mouth at bedtime. 60 tablet 1  . azithromycin (ZITHROMAX) 250 MG tablet Take 2 tablets by mouth on day 1, followed by 1 tablet by mouth daily for 4 days. 6 tablet 0  . benzonatate (TESSALON) 100 MG capsule Take 1 capsule (100 mg total) by mouth 3 (three) times daily as needed for cough. 30 capsule 0  . oseltamivir (TAMIFLU) 75 MG capsule Take 1 capsule (75 mg total) by mouth 2 (two) times daily. 10 capsule 0  . tiZANidine (ZANAFLEX) 4 MG tablet Take 0.5-1 tablets (2-4 mg total) by mouth 2 (two) times daily as needed for muscle spasms. 30 tablet 1   No facility-administered medications prior to visit.     Allergies  Allergen Reactions  . Contrast Media [Iodinated Diagnostic Agents] Swelling    Can take if premedicated with diphenhydramine  . Iodine Swelling  . Metformin And Related Diarrhea    Review of Systems  Constitutional: Negative for fever and malaise/fatigue.  HENT: Negative for congestion.   Eyes: Negative for blurred vision.  Respiratory: Negative for shortness of breath.   Cardiovascular: Negative for chest pain,  palpitations and leg swelling.  Gastrointestinal: Negative for abdominal pain, blood in stool and nausea.  Genitourinary: Negative for dysuria and frequency.  Musculoskeletal: Positive for back pain, joint pain and myalgias. Negative for falls.  Skin: Negative for rash.  Neurological: Negative for dizziness, loss of consciousness and headaches.  Endo/Heme/Allergies: Negative for environmental allergies.  Psychiatric/Behavioral: Negative  for depression. The patient is not nervous/anxious.        Objective:    Physical Exam  Constitutional: He is oriented to person, place, and time. He appears well-developed and well-nourished. No distress.  HENT:  Head: Normocephalic and atraumatic.  Nose: Nose normal.  Eyes: Right eye exhibits no discharge. Left eye exhibits no discharge.  Neck: Normal range of motion. Neck supple.  Cardiovascular: Normal rate and regular rhythm.  No murmur heard. Pulmonary/Chest: Effort normal and breath sounds normal.  Abdominal: Soft. Bowel sounds are normal. There is no tenderness.  Musculoskeletal: He exhibits no edema.  Neurological: He is alert and oriented to person, place, and time.  Skin: Skin is warm and dry.  Psychiatric: He has a normal mood and affect.  Nursing note and vitals reviewed.   BP (!) 146/83 (BP Location: Left Arm, Patient Position: Sitting, Cuff Size: Large)   Temp 99 F (37.2 C) (Oral)   Resp 16   Ht 6' 0.05" (1.83 m)   Wt 187 lb (84.8 kg)   SpO2 98%   BMI 25.33 kg/m  Wt Readings from Last 3 Encounters:  12/11/17 187 lb (84.8 kg)  11/13/17 188 lb 12.8 oz (85.6 kg)  10/30/17 189 lb 6.4 oz (85.9 kg)   BP Readings from Last 3 Encounters:  12/11/17 (!) 146/83  11/13/17 130/69  10/30/17 136/76     Immunization History  Administered Date(s) Administered  . Influenza,inj,Quad PF,6+ Mos 06/02/2013  . Influenza-Unspecified 05/05/2014, 05/02/2015, 05/08/2016  . Pneumococcal Conjugate-13 06/02/2013  . Pneumococcal  Polysaccharide-23 09/07/2015  . Pneumococcal-Unspecified 08/16/2009  . Tdap 08/22/2010    Health Maintenance  Topic Date Due  . OPHTHALMOLOGY EXAM  09/02/2016  . FOOT EXAM  10/06/2016  . HEMOGLOBIN A1C  02/19/2018  . INFLUENZA VACCINE  03/14/2018  . COLONOSCOPY  10/23/2018  . TETANUS/TDAP  08/22/2020  . PNEUMOCOCCAL POLYSACCHARIDE VACCINE  Completed  . Hepatitis C Screening  Completed  . HIV Screening  Completed    Lab Results  Component Value Date   WBC 7.4 05/09/2017   HGB 16.7 05/09/2017   HCT 50.3 05/09/2017   PLT 250.0 05/09/2017   GLUCOSE 161 (H) 10/26/2017   CHOL 115 04/27/2017   TRIG 91.0 04/27/2017   HDL 45.70 04/27/2017   LDLDIRECT 109.0 10/26/2014   LDLCALC 52 04/27/2017   ALT 30 05/09/2017   AST 24 05/09/2017   NA 135 10/26/2017   K 4.2 10/26/2017   CL 101 10/26/2017   CREATININE 0.85 10/26/2017   BUN 14 10/26/2017   CO2 25 10/26/2017   TSH 1.88 10/30/2017   PSA 0.86 04/27/2017   INR 1.10 08/16/2014   HGBA1C 7.3 (H) 08/22/2017   MICROALBUR <0.7 08/22/2017    Lab Results  Component Value Date   TSH 1.88 10/30/2017   Lab Results  Component Value Date   WBC 7.4 05/09/2017   HGB 16.7 05/09/2017   HCT 50.3 05/09/2017   MCV 92.2 05/09/2017   PLT 250.0 05/09/2017   Lab Results  Component Value Date   NA 135 10/26/2017   K 4.2 10/26/2017   CO2 25 10/26/2017   GLUCOSE 161 (H) 10/26/2017   BUN 14 10/26/2017   CREATININE 0.85 10/26/2017   BILITOT 0.6 05/09/2017   ALKPHOS 74 05/09/2017   AST 24 05/09/2017   ALT 30 05/09/2017   PROT 8.3 05/09/2017   ALBUMIN 4.9 05/09/2017   CALCIUM 9.5 10/26/2017   ANIONGAP 11 01/31/2015   GFR 97.90 10/26/2017   Lab Results  Component Value Date   CHOL 115 04/27/2017   Lab Results  Component Value Date   HDL 45.70 04/27/2017   Lab Results  Component Value Date   LDLCALC 52 04/27/2017   Lab Results  Component Value Date   TRIG 91.0 04/27/2017   Lab Results  Component Value Date   CHOLHDL 3  04/27/2017   Lab Results  Component Value Date   HGBA1C 7.3 (H) 08/22/2017         Assessment & Plan:   Problem List Items Addressed This Visit    HTN (hypertension)    Well controlled, no changes to meds. Encouraged heart healthy diet such as the DASH diet and exercise as tolerated.       Sciatica of right side   Relevant Medications   amitriptyline (ELAVIL) 10 MG tablet   tiZANidine (ZANAFLEX) 4 MG tablet   Hyperlipidemia, mixed    Tolerating statin, encouraged heart healthy diet, avoid trans fats, minimize simple carbs and saturated fats. Increase exercise as tolerated      Trigger finger - Primary    Worsening despite steroid injections is referred to hand surgeon for further consideration      Relevant Orders   Ambulatory referral to Hand Surgery   Muscle spasm    Encouraged moist heat and gentle stretching as tolerated. May try NSAIDs and prescription meds as directed and report if symptoms worsen or seek immediate care. Zanaflex prn.       Relevant Medications   amitriptyline (ELAVIL) 10 MG tablet   tiZANidine (ZANAFLEX) 4 MG tablet      I have discontinued Albert [name]'s oseltamivir, azithromycin, and benzonatate. I am also having him maintain his loperamide, aspirin EC, sodium chloride, lisinopril, Turmeric Curcumin, MULTIPLE VITAMIN PO, atorvastatin, ACCU-CHEK GUIDE, canagliflozin, VICTOZA, Insulin Detemir, Insulin Pen Needle, fluticasone, amitriptyline, and tiZANidine.  Meds ordered this encounter  Medications  . amitriptyline (ELAVIL) 10 MG tablet    Sig: Take 1-2 tablets (10-20 mg total) by mouth at bedtime.    Dispense:  60 tablet    Refill:  1  . tiZANidine (ZANAFLEX) 4 MG tablet    Sig: Take 0.5-1 tablets (2-4 mg total) by mouth 2 (two) times daily as needed for muscle spasms.    Dispense:  30 tablet    Refill:  2    CMA served as scribe during this visit. History, Physical and Plan performed by medical provider. Documentation and orders  reviewed and attested to.  [name], MD

## 2017-12-11 NOTE — Patient Instructions (Signed)

## 2017-12-15 ENCOUNTER — Encounter: Payer: Self-pay | Admitting: Family Medicine

## 2017-12-16 NOTE — Assessment & Plan Note (Signed)
Worsening despite steroid injections is referred to hand surgeon for further consideration

## 2017-12-16 NOTE — Assessment & Plan Note (Signed)
Tolerating statin, encouraged heart healthy diet, avoid trans fats, minimize simple carbs and saturated fats. Increase exercise as tolerated 

## 2017-12-16 NOTE — Assessment & Plan Note (Signed)
Well controlled, no changes to meds. Encouraged heart healthy diet such as the DASH diet and exercise as tolerated.  °

## 2017-12-16 NOTE — Assessment & Plan Note (Signed)
Encouraged moist heat and gentle stretching as tolerated. May try NSAIDs and prescription meds as directed and report if symptoms worsen or seek immediate care. Zanaflex prn.

## 2017-12-17 MED FILL — LEVEMIR FLEXTOUCH 100 UNITS: 100 | 25 days supply | Qty: 3 | Fill #3

## 2017-12-25 ENCOUNTER — Ambulatory Visit (INDEPENDENT_AMBULATORY_CARE_PROVIDER_SITE_OTHER): Payer: 59 | Admitting: Orthopaedic Surgery

## 2017-12-25 ENCOUNTER — Encounter (INDEPENDENT_AMBULATORY_CARE_PROVIDER_SITE_OTHER): Payer: Self-pay | Admitting: Orthopaedic Surgery

## 2017-12-25 VITALS — BP 130/85 | HR 100 | Resp 14 | Ht 72.0 in | Wt 184.0 lb

## 2017-12-25 DIAGNOSIS — M65332 Trigger finger, left middle finger: Secondary | ICD-10-CM | POA: Diagnosis not present

## 2017-12-25 NOTE — Progress Notes (Signed)
Office Visit Note   Patient: Albert [name]           Date of Birth: Jul 13, 1956           MRN: [id number] Visit Date: 12/25/2017              Requested by: Albert Lukes, MD [address] [place], [place] [address] PCP: Albert Lukes, MD   Assessment & Plan: Visit Diagnoses:  1. Trigger finger, left middle finger     Plan: Left long trigger finger.  Albert [name] has had a number of cortisone injections per his primary care physician.  At this point I would suggest surgical release of the A-1 pulley.  Long discussion regarding surgery incision outpatient nature dressing, time out of work.  Would like to proceed.  We will schedule this at his convenience  Follow-Up Instructions: Return will schedule surgery.   Orders:  No orders of the defined types were placed in this encounter.  No orders of the defined types were placed in this encounter.     Procedures: No procedures performed   Clinical Data: No additional findings.   Subjective: Chief Complaint  Patient presents with  . Left Hand - Pain  . Hand Pain    Left finger pain - middle x 6 months, had inj. Dr. Felipe [name], helped some, frozen, no injury, no surgery to hand, diabetic type 2, Tylenol helps some,  swelling, numbness, weakness  No history of injury or trauma.  Has reached a point where he is having difficulty with full extension of the IP joint  HPI  Review of Systems  Constitutional: Negative for activity change.  HENT: Negative for trouble swallowing.   Eyes: Negative for pain.  Respiratory: Negative for shortness of breath.   Cardiovascular: Negative for leg swelling.  Gastrointestinal: Positive for constipation and diarrhea.  Endocrine: Negative for cold intolerance.  Genitourinary: Negative for difficulty urinating.  Musculoskeletal: Positive for joint swelling.  Skin: Negative for rash.  Allergic/Immunologic: Negative for food allergies.  Neurological: Positive for weakness and  numbness.  Hematological: Does not bruise/bleed easily.  Psychiatric/Behavioral: Positive for sleep disturbance.     Objective: Vital Signs: BP 130/85 (BP Location: Right Arm, Patient Position: Sitting, Cuff Size: Normal)   Pulse 100   Resp 14   Ht 6' (1.829 m)   Wt 184 lb (83.5 kg)   BMI 24.95 kg/m   Physical Exam  Constitutional: He is oriented to person, place, and time. He appears well-developed and well-nourished.  HENT:  Mouth/Throat: Oropharynx is clear and moist.  Eyes: Pupils are equal, round, and reactive to light. EOM are normal.  Pulmonary/Chest: Effort normal.  Neurological: He is alert and oriented to person, place, and time.  Skin: Skin is warm and dry.  Psychiatric: He has a normal mood and affect. His behavior is normal.    Ortho Exam.  Comfortable sitting.  Exam limited to left hand.  There is a painful nodule over the palmar aspect of the long finger level of the metacarpal phalangeal joint.  He is unable to fully extend the IP joint with active triggering.  Skin intact.  Neurovascular exam intact  Specialty Comments:  No specialty comments available.  Imaging: No results found.   PMFS History: Patient Active Problem List   Diagnosis Date Noted  . Trigger finger, left middle finger 12/25/2017  . Muscle spasm 10/31/2017  . Trigger finger 05/03/2017  . Elbow pain, chronic, right 08/13/2016  . Tendinitis  of finger of left hand 07/31/2016  . Tendinitis of right triceps 07/31/2016  . 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 Atypical chest pain 11/01/2014  . Hyperlipidemia, mixed 07/26/2014  . 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 Preventative health care 07/26/2014  . BPH (benign prostatic hyperplasia) 12/11/2013  . Diverticulosis 10/26/2013  . Acute neck pain 06/04/2013  . Insomnia 02/25/2013  . Sciatica of right side 12/22/2012  . HTN (hypertension) 11/22/2012  . DM (diabetes mellitus), type 2 with  neurological complications ([facility]) 76/73/4193  . Lumbar radiculopathy 02/09/2012   Past Medical History:  Diagnosis Date  . Colon polyps    adenomatous  . Diverticulosis 10/26/2013  . DM (diabetes mellitus), type 2 with neurological complications ([place]) 7/90/2409  . 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 History of chicken pox    childhood  . History of kidney stones   . Hyperlipidemia 07/26/2014  . Hypertension   . Insomnia 02/25/2013  . Preventative health care 07/26/2014  . Renal lithiasis 02/21/2015  . Sciatica of right side 12/22/2012  . Seasonal allergies   . 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 Trigger finger 05/03/2017    Family History  Problem Relation Age of Onset  . Lung cancer Mother   . Diabetes Maternal Aunt   . Cancer Paternal Grandmother   . Breast cancer Neg Hx   . Colon cancer Neg Hx   . Prostate cancer Neg Hx   . Heart disease Neg Hx     Past Surgical History:  Procedure Laterality Date  . ABDOMINAL HERNIA REPAIR    . CHOLECYSTECTOMY    . HERNIA REPAIR    . LAPAROSCOPIC APPENDECTOMY N/A 08/16/2014   Procedure: APPENDECTOMY LAPAROSCOPIC;  Surgeon: Erroll Luna, MD;  Location: [facility];  Service: General;  Laterality: N/A;  . MASS EXCISION  05/22/2012   Procedure: EXCISION MASS;  Surgeon: Joyice Faster. Cornett, MD;  Location: [facility];  Service: General;  Laterality: Left;  excision abdominal wall mass  . ROTATOR CUFF REPAIR     Social History   Occupational History    CommentDispensing optician [employer]  Tobacco Use  . Smoking status: Never Smoker  . Smokeless tobacco: Never Used  Substance and Sexual Activity  . Alcohol use: No  . Drug use: No  . Sexual activity: Yes

## 2017-12-26 MED FILL — VICTOZA 18 MG/3 ML INJECT P: 18 | 30 days supply | Qty: 9 | Fill #2

## 2017-12-26 MED FILL — tiZANidine HCL 4 MG TABS: 4 | 15 days supply | Qty: 30 | Fill #1

## 2017-12-28 NOTE — H&P (Addendum)
Albert Fears, MD   Biagio Borg, PA-C [address] [place], [place]  [address]                             [phone]   ORTHOPAEDIC HISTORY & PHYSICAL  Albert [name] MRN:  [id number] DOB/SEX:  1955/10/21/male  CHIEF COMPLAINT:  Painful left long finger with triggering  HISTORY: Mr Desa has had a six month history of Left long finger pain. Has had an injection by Dr. Felipe Drone which has helped some. No previous surgery to hand. He is a diabetic type 2. Has locking symptoms Tylenol helps some.  PAST MEDICAL HISTORY: Patient Active Problem List   Diagnosis Date Noted  . Trigger finger, left middle finger 12/25/2017  . Muscle spasm 10/31/2017  . Trigger finger 05/03/2017  . Elbow pain, chronic, right 08/13/2016  . Tendinitis of finger of left hand 07/31/2016  . Tendinitis of right triceps 07/31/2016  . 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 Atypical chest pain 11/01/2014  . Hyperlipidemia, mixed 07/26/2014  . 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 Preventative health care 07/26/2014  . BPH (benign prostatic hyperplasia) 12/11/2013  . Diverticulosis 10/26/2013  . Acute neck pain 06/04/2013  . Insomnia 02/25/2013  . Sciatica of right side 12/22/2012  . HTN (hypertension) 11/22/2012  . DM (diabetes mellitus), type 2 with neurological complications ([place]) 53/97/6734  . Lumbar radiculopathy 02/09/2012   Past Medical History:  Diagnosis Date  . Colon polyps    adenomatous  . Diverticulosis 10/26/2013  . DM (diabetes mellitus), type 2 with neurological complications ([place]) 1/93/7902  . 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 History of chicken pox    childhood  . History of kidney stones   . Hyperlipidemia 07/26/2014  . Hypertension   . Insomnia 02/25/2013  . Preventative health care 07/26/2014  . Renal lithiasis 02/21/2015  . Sciatica of right side 12/22/2012  . Seasonal allergies   . Squamous cell  carcinoma in situ 2014   forehead, s/p excision by derm  . Trigger finger 05/03/2017   Past Surgical History:  Procedure Laterality Date  . ABDOMINAL HERNIA REPAIR    . CHOLECYSTECTOMY    . HERNIA REPAIR    . LAPAROSCOPIC APPENDECTOMY N/A 08/16/2014   Procedure: APPENDECTOMY LAPAROSCOPIC;  Surgeon: Erroll Luna, MD;  Location: [facility];  Service: General;  Laterality: N/A;  . MASS EXCISION  05/22/2012   Procedure: EXCISION MASS;  Surgeon: Joyice Faster. Cornett, MD;  Location: [facility];  Service: General;  Laterality: Left;  excision abdominal wall mass  . ROTATOR CUFF REPAIR       MEDICATIONS:   No medications prior to admission.   No current facility-administered medications for this encounter.    Current Outpatient Medications  Medication Sig Dispense Refill  . ACCU-CHEK GUIDE test strip USE TO TEST BLOOD SUGAR DAILY AT VARIABLE TIMES 50 each 4  . amitriptyline (ELAVIL) 10 MG tablet Take 1-2 tablets (10-20 mg total) by mouth at bedtime. 60 tablet 1  . aspirin EC 81 MG tablet Take 1 tablet (81 mg total) by mouth daily.    Marland Kitchen atorvastatin (LIPITOR) 40 MG tablet TAKE 1 TABLET BY MOUTH DAILY 90 tablet 0  . canagliflozin (INVOKANA) 300 MG TABS tablet Take 1 tablet (300 mg total) by mouth daily before breakfast. 30 tablet 3  . fluticasone (FLONASE) 50 MCG/ACT nasal spray Place 2 sprays into both  nostrils daily. (Patient taking differently: Place 2 sprays into both nostrils daily as needed. ) 16 g 1  . Insulin Detemir (LEVEMIR FLEXTOUCH) 100 UNIT/ML Pen Inject 16 Units into the skin daily at 10 pm. 15 mL 1  . Insulin Pen Needle 31G X 8 MM MISC Use to inject medication 3 times per day. 300 each 2  . lisinopril (PRINIVIL,ZESTRIL) 5 MG tablet Take 1 tablet (5 mg total) by mouth daily. (Patient not taking: Reported on 12/25/2017) 90 tablet 3  . loperamide (IMODIUM A-D) 2 MG tablet Take 2 mg by mouth 4 (four) times daily as needed for diarrhea or loose stools.    . MULTIPLE VITAMIN PO  Take 1 packet by mouth daily.    . sodium chloride (OCEAN) 0.65 % SOLN nasal spray Place 1 spray into both nostrils as needed for congestion.    Marland Kitchen tiZANidine (ZANAFLEX) 4 MG tablet Take 0.5-1 tablets (2-4 mg total) by mouth 2 (two) times daily as needed for muscle spasms. 30 tablet 2  . Turmeric Curcumin 500 MG CAPS Take 1 capsule by mouth daily.    Marland Kitchen VICTOZA 18 MG/3ML SOPN INJECT 0.3 MLS (1.8 MG TOTAL) INTO THE SKIN DAILY. 9 mL 3     ALLERGIES:   Allergies  Allergen Reactions  . Contrast Media [Iodinated Diagnostic Agents] Swelling    Can take if premedicated with diphenhydramine  . Iodine Swelling  . Metformin And Related Diarrhea    REVIEW OF SYSTEMS:  Review of Systems  Constitutional: Negative for activity change.  HENT: Negative for trouble swallowing.   Eyes: Negative for pain.  Respiratory: Negative for shortness of breath.   Cardiovascular: Negative for leg swelling.  Gastrointestinal: Positive for constipation and diarrhea.  Endocrine: Negative for cold intolerance.  Genitourinary: Negative for difficulty urinating.  Musculoskeletal: Positive for joint swelling.  Skin: Negative for rash.  Allergic/Immunologic: Negative for food allergies.  Neurological: Positive for weakness and numbness.  Hematological: Does not bruise/bleed easily.  Psychiatric/Behavioral: Positive for sleep disturbance.     FAMILY HISTORY:   Family History  Problem Relation Age of Onset  . Lung cancer Mother   . Diabetes Maternal Aunt   . Cancer Paternal Grandmother   . Breast cancer Neg Hx   . Colon cancer Neg Hx   . Prostate cancer Neg Hx   . Heart disease Neg Hx     SOCIAL HISTORY:   Social History   Tobacco Use  . Smoking status: Never Smoker  . Smokeless tobacco: Never Used  Substance Use Topics  . Alcohol use: No      EXAMINATION: Objective: Vital Signs: BP 130/85 (BP Location: Right Arm, Patient Position: Sitting, Cuff Size: Normal)   Pulse 100   Resp 14   Ht 6'  (1.829 m)   Wt 184 lb (83.5 kg)   BMI 24.95 kg/m     Head is normocephalic.   Eyes:  Pupils equal, round and reactive to light and accommodation.  Extraocular intact. ENT: Ears, nose, and throat were benign.   Neck: supple, no bruits were noted.   Chest: good expansion.   Lungs: essentially clear.   Cardiac: regular rhythm and rate, normal S1, S2.  No murmurs appreciated. Pulses :  2+ bilateral and symmetric in upper extremities. Abdomen is scaphoid, soft, nontender, no masses palpable, normal bowel sounds  present. CNS:  He is oriented x3 and cranial nerves II-XII grossly intact. Breast, rectal, and genital exams: not performed and not indicated for an orthopedic evaluation. Musculoskeletal:  Exam limited to left hand.  There is a painful nodule over the palmar aspect of the long finger level of the metacarpal phalangeal joint.  He is unable to fully extend the IP joint without active triggering.  Skin intact.  Neurovascular exam intact      ASSESSMENT: left long trigger finger  Past Medical History:  Diagnosis Date  . Colon polyps    adenomatous  . Diverticulosis 10/26/2013  . DM (diabetes mellitus), type 2 with neurological complications ([facility] [place]) 0/02/6225  . 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 History of chicken pox    childhood  . History of kidney stones   . Hyperlipidemia 07/26/2014  . Hypertension   . Insomnia 02/25/2013  . Preventative health care 07/26/2014  . Renal lithiasis 02/21/2015  . Sciatica of right side 12/22/2012  . Seasonal allergies   . 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 Trigger finger 05/03/2017    PLAN: Plan for left long trigger finger release  The procedure,  risks, and benefits of surgery were presented and reviewed. The risks including but not limited to infection, blood clots, vascular and nerve injury, stiffness,  among others were discussed. The patient acknowledged the  explanation, agreed to proceed.   Mike Craze [place], [place] [phone]   12/28/2017 12:48 PM

## 2017-12-31 ENCOUNTER — Other Ambulatory Visit: Payer: Self-pay | Admitting: Endocrinology

## 2017-12-31 MED FILL — INVOKANA 300 MG TABLET: 300 | 30 days supply | Qty: 30 | Fill #0

## 2017-12-31 MED FILL — AMITRIPTYLINE HCL 10 MG TAB: 10 | 30 days supply | Qty: 60 | Fill #0

## 2018-01-09 ENCOUNTER — Encounter (HOSPITAL_BASED_OUTPATIENT_CLINIC_OR_DEPARTMENT_OTHER): Payer: Self-pay | Admitting: *Deleted

## 2018-01-09 ENCOUNTER — Other Ambulatory Visit: Payer: Self-pay

## 2018-01-09 NOTE — Pre-Procedure Instructions (Signed)
Bring all medications. Pt coming Friday for BMET and EKG. Instructed pt to stop aspirin 5 days before surgery, pt takes it prophylactically.

## 2018-01-11 ENCOUNTER — Encounter (HOSPITAL_BASED_OUTPATIENT_CLINIC_OR_DEPARTMENT_OTHER)
Admission: RE | Admit: 2018-01-11 | Discharge: 2018-01-11 | Disposition: A | Discharge status: Home / Self Care | Payer: 59 | Source: Ambulatory Visit | Attending: Orthopaedic Surgery | Admitting: Orthopaedic Surgery

## 2018-01-11 DIAGNOSIS — Z85828 Personal history of other malignant neoplasm of skin: Secondary | ICD-10-CM | POA: Diagnosis not present

## 2018-01-11 DIAGNOSIS — I10 Essential (primary) hypertension: Secondary | ICD-10-CM | POA: Diagnosis not present

## 2018-01-11 DIAGNOSIS — M65332 Trigger finger, left middle finger: Secondary | ICD-10-CM | POA: Diagnosis not present

## 2018-01-11 DIAGNOSIS — Z794 Long term (current) use of insulin: Secondary | ICD-10-CM | POA: Diagnosis not present

## 2018-01-11 DIAGNOSIS — E785 Hyperlipidemia, unspecified: Secondary | ICD-10-CM | POA: Diagnosis not present

## 2018-01-11 DIAGNOSIS — Z79899 Other long term (current) drug therapy: Secondary | ICD-10-CM | POA: Diagnosis not present

## 2018-01-11 DIAGNOSIS — E119 Type 2 diabetes mellitus without complications: Secondary | ICD-10-CM | POA: Diagnosis not present

## 2018-01-11 DIAGNOSIS — M79645 Pain in left finger(s): Secondary | ICD-10-CM | POA: Diagnosis present

## 2018-01-11 DIAGNOSIS — Z7982 Long term (current) use of aspirin: Secondary | ICD-10-CM | POA: Diagnosis not present

## 2018-01-11 DIAGNOSIS — Z7951 Long term (current) use of inhaled steroids: Secondary | ICD-10-CM | POA: Diagnosis not present

## 2018-01-11 LAB — BASIC METABOLIC PANEL
Anion gap: 7 (ref 5–15)
BUN: 18 mg/dL (ref 6–20)
CO2: 25 mmol/L (ref 22–32)
CREATININE: 1.01 mg/dL (ref 0.61–1.24)
Calcium: 9.5 mg/dL (ref 8.9–10.3)
Chloride: 104 mmol/L (ref 101–111)
GFR calc Af Amer: >60 mL/min (ref >60)
GFR calc non Af Amer: >60 mL/min (ref >60)
GLUCOSE: 171 mg/dL — AB (ref 65–99)
POTASSIUM: 4.7 mmol/L (ref 3.5–5.1)
Sodium: 136 mmol/L (ref 135–145)

## 2018-01-11 MED FILL — tiZANidine HCL 4 MG TABS: 4 | 15 days supply | Qty: 30 | Fill #2

## 2018-01-11 NOTE — Progress Notes (Signed)
EKG reviewed by Dr. Houser, will proceed with surgery as scheduled.  ?

## 2018-01-14 ENCOUNTER — Inpatient Hospital Stay (INDEPENDENT_AMBULATORY_CARE_PROVIDER_SITE_OTHER): Payer: 59 | Admitting: Orthopaedic Surgery

## 2018-01-15 ENCOUNTER — Encounter (HOSPITAL_BASED_OUTPATIENT_CLINIC_OR_DEPARTMENT_OTHER)
Admission: RE | Disposition: A | Discharge status: Home / Self Care | Payer: Self-pay | Source: Ambulatory Visit | Attending: Orthopaedic Surgery

## 2018-01-15 ENCOUNTER — Encounter (HOSPITAL_BASED_OUTPATIENT_CLINIC_OR_DEPARTMENT_OTHER): Payer: Self-pay | Admitting: *Deleted

## 2018-01-15 ENCOUNTER — Ambulatory Visit (HOSPITAL_BASED_OUTPATIENT_CLINIC_OR_DEPARTMENT_OTHER)
Admission: RE | Admit: 2018-01-15 | Discharge: 2018-01-15 | Disposition: A | Discharge status: Home / Self Care | Payer: 59 | Source: Ambulatory Visit | Attending: Orthopaedic Surgery | Admitting: Orthopaedic Surgery

## 2018-01-15 DIAGNOSIS — Z7982 Long term (current) use of aspirin: Secondary | ICD-10-CM | POA: Diagnosis not present

## 2018-01-15 DIAGNOSIS — M65332 Trigger finger, left middle finger: Secondary | ICD-10-CM | POA: Diagnosis not present

## 2018-01-15 DIAGNOSIS — I10 Essential (primary) hypertension: Secondary | ICD-10-CM | POA: Insufficient documentation

## 2018-01-15 DIAGNOSIS — Z79899 Other long term (current) drug therapy: Secondary | ICD-10-CM | POA: Insufficient documentation

## 2018-01-15 DIAGNOSIS — Z794 Long term (current) use of insulin: Secondary | ICD-10-CM | POA: Insufficient documentation

## 2018-01-15 DIAGNOSIS — Z85828 Personal history of other malignant neoplasm of skin: Secondary | ICD-10-CM | POA: Diagnosis not present

## 2018-01-15 DIAGNOSIS — E119 Type 2 diabetes mellitus without complications: Secondary | ICD-10-CM | POA: Insufficient documentation

## 2018-01-15 DIAGNOSIS — Z7951 Long term (current) use of inhaled steroids: Secondary | ICD-10-CM | POA: Diagnosis not present

## 2018-01-15 DIAGNOSIS — E785 Hyperlipidemia, unspecified: Secondary | ICD-10-CM | POA: Insufficient documentation

## 2018-01-15 HISTORY — PX: TRIGGER FINGER RELEASE: SHX641

## 2018-01-15 SURGERY — MINOR RELEASE TRIGGER FINGER/A-1 PULLEY
Anesthesia: LOCAL | Site: Hand | Laterality: Left

## 2018-01-15 MED ORDER — MIDAZOLAM HCL 2 MG/2ML IJ SOLN: 1.0000 mg | INTRAMUSCULAR | Status: DC | PRN

## 2018-01-15 MED ORDER — BUPIVACAINE-EPINEPHRINE 0.25% -1:200000 IJ SOLN
INTRAMUSCULAR | Status: DC | PRN
  Administered 2018-01-15: 2.5 mL

## 2018-01-15 MED ORDER — SCOPOLAMINE 1 MG/3DAYS TD PT72: 1.0000 | MEDICATED_PATCH | Freq: Once | TRANSDERMAL | Status: DC | PRN

## 2018-01-15 MED ORDER — FENTANYL CITRATE (PF) 100 MCG/2ML IJ SOLN: 50.0000 ug | INTRAMUSCULAR | Status: DC | PRN

## 2018-01-15 MED ORDER — LIDOCAINE HCL (PF) 1 % IJ SOLN
INTRAMUSCULAR | Status: DC | PRN
  Administered 2018-01-15: 2.5 mL

## 2018-01-15 MED ORDER — CHLORHEXIDINE GLUCONATE 4 % EX LIQD: 60.0000 mL | Freq: Once | CUTANEOUS | Status: DC

## 2018-01-15 MED ORDER — LACTATED RINGERS IV SOLN: INTRAVENOUS | Status: DC

## 2018-01-15 MED FILL — HYDROCODON-APAP 5-325: 5-325 | 3 days supply | Qty: 30 | Fill #0

## 2018-01-15 SURGICAL SUPPLY — 53 items
BANDAGE ACE 3X5.8 VEL STRL LF (GAUZE/BANDAGES/DRESSINGS) ×3 IMPLANT
BANDAGE COBAN STERILE 2 (GAUZE/BANDAGES/DRESSINGS) IMPLANT
BLADE SURG 15 STRL LF DISP TIS (BLADE) ×2 IMPLANT
BLADE SURG 15 STRL SS (BLADE) ×1
BNDG ESMARK 4X9 LF (GAUZE/BANDAGES/DRESSINGS) IMPLANT
CANISTER SUCT 1200ML W/VALVE (MISCELLANEOUS) IMPLANT
COVER BACK TABLE 60X90IN (DRAPES) ×3 IMPLANT
COVER MAYO STAND STRL (DRAPES) ×3 IMPLANT
DRAPE EXTREMITY T 121X128X90 (DRAPE) ×3 IMPLANT
DRAPE SURG 17X23 STRL (DRAPES) IMPLANT
DRSG EMULSION OIL 3X3 NADH (GAUZE/BANDAGES/DRESSINGS) ×3 IMPLANT
DRSG PAD ABDOMINAL 8X10 ST (GAUZE/BANDAGES/DRESSINGS) ×3 IMPLANT
DURAPREP 26ML APPLICATOR (WOUND CARE) ×3 IMPLANT
ELECT NEEDLE TIP 2.8 STRL (NEEDLE) IMPLANT
ELECT REM PT RETURN 9FT ADLT (ELECTROSURGICAL) ×3
ELECTRODE REM PT RTRN 9FT ADLT (ELECTROSURGICAL) ×2 IMPLANT
GAUZE SPONGE 4X4 12PLY STRL (GAUZE/BANDAGES/DRESSINGS) ×3 IMPLANT
GLOVE BIOGEL PI IND STRL 6.5 (GLOVE) ×2 IMPLANT
GLOVE BIOGEL PI IND STRL 7.0 (GLOVE) ×6 IMPLANT
GLOVE BIOGEL PI IND STRL 8 (GLOVE) ×2 IMPLANT
GLOVE BIOGEL PI INDICATOR 6.5 (GLOVE) ×1
GLOVE BIOGEL PI INDICATOR 7.0 (GLOVE) ×3
GLOVE BIOGEL PI INDICATOR 8 (GLOVE) ×1
GLOVE ECLIPSE 6.5 STRL STRAW (GLOVE) ×3 IMPLANT
GLOVE ECLIPSE 7.0 STRL STRAW (GLOVE) ×3 IMPLANT
GLOVE ECLIPSE 8.0 STRL XLNG CF (GLOVE) ×3 IMPLANT
GLOVE SURG SS PI 6.0 STRL IVOR (GLOVE) ×3 IMPLANT
GOWN STRL REUS W/ TWL LRG LVL3 (GOWN DISPOSABLE) ×2 IMPLANT
GOWN STRL REUS W/ TWL XL LVL3 (GOWN DISPOSABLE) ×2 IMPLANT
GOWN STRL REUS W/TWL LRG LVL3 (GOWN DISPOSABLE) ×1
GOWN STRL REUS W/TWL XL LVL3 (GOWN DISPOSABLE) ×1
NEEDLE HYPO 25X1 1.5 SAFETY (NEEDLE) ×3 IMPLANT
NEEDLE PRECISIONGLIDE 27X1.5 (NEEDLE) ×3 IMPLANT
NS IRRIG 1000ML POUR BTL (IV SOLUTION) ×3 IMPLANT
PACK BASIN DAY SURGERY FS (CUSTOM PROCEDURE TRAY) ×3 IMPLANT
PAD CAST 3X4 CTTN HI CHSV (CAST SUPPLIES) ×2 IMPLANT
PADDING CAST ABS 3INX4YD NS (CAST SUPPLIES)
PADDING CAST ABS 4INX4YD NS (CAST SUPPLIES)
PADDING CAST ABS COTTON 3X4 (CAST SUPPLIES) IMPLANT
PADDING CAST ABS COTTON 4X4 ST (CAST SUPPLIES) IMPLANT
PADDING CAST COTTON 3X4 STRL (CAST SUPPLIES) ×1
PENCIL BUTTON HOLSTER BLD 10FT (ELECTRODE) IMPLANT
SPLINT PLASTER CAST XFAST 3X15 (CAST SUPPLIES) IMPLANT
SPLINT PLASTER XTRA FASTSET 3X (CAST SUPPLIES)
STOCKINETTE 4X48 STRL (DRAPES) ×3 IMPLANT
SUCTION FRAZIER HANDLE 10FR (MISCELLANEOUS)
SUCTION TUBE FRAZIER 10FR DISP (MISCELLANEOUS) IMPLANT
SUT ETHILON 4 0 PS 2 18 (SUTURE) ×3 IMPLANT
SYR BULB 3OZ (MISCELLANEOUS) ×3 IMPLANT
SYR CONTROL 10ML LL (SYRINGE) ×3 IMPLANT
TOWEL GREEN STERILE FF (TOWEL DISPOSABLE) ×3 IMPLANT
TUBE CONNECTING 20X1/4 (TUBING) IMPLANT
UNDERPAD 30X30 (UNDERPADS AND DIAPERS) IMPLANT

## 2018-01-15 NOTE — Discharge Instructions (Signed)
Do not remove bandage.  Do not get bandage wet.  Apply ice and elevate for pain and swelling.  Follow-up with Dr. Durward Fortes in one week for suture check

## 2018-01-15 NOTE — Op Note (Signed)
PATIENT ID:      MAKHARI DOVIDIO  MRN:     [id number] DOB/AGE:    1956-07-19 / 62 y.o.       OPERATIVE REPORT    DATE OF PROCEDURE:  01/15/2018       PREOPERATIVE DIAGNOSIS:   left long trigger finger                                                       Estimated body mass index is 24.95 kg/m as calculated from the following:   Height as of this encounter: 6' (1.829 m).   Weight as of this encounter: 184 lb (83.5 kg).     POSTOPERATIVE DIAGNOSIS:   left long trigger finger                                                                     Estimated body mass index is 24.95 kg/m as calculated from the following:   Height as of this encounter: 6' (1.829 m).   Weight as of this encounter: 184 lb (83.5 kg).     PROCEDURE:  Procedure(s): LEFT TRIGGER FINGER A-1 PULLEY RELEASE      SURGEON:  Joni Fears, MD    ASSISTANT:   none         ANESTHESIA: local     DRAINS: none :      TOURNIQUET TIME: none    COMPLICATIONS:  None   CONDITION:  stable  PROCEDURE IN [id number]    Vonna Kotyk Kaysie Michelini 01/15/2018, 7:52 AM

## 2018-01-15 NOTE — Op Note (Signed)
NAME: Albert [name], Albert [name] MEDICAL RECORD [id number] ACCOUNT [id number] DATE OF BIRTH:[birth year] FACILITY: [facility] LOCATION: [place], [place]  OPERATIVE REPORT  DATE OF PROCEDURE:  01/15/2018  PREOPERATIVE DIAGNOSIS:  Left long trigger finger.  POSTOPERATIVE DIAGNOSIS:  Left long trigger finger.  PROCEDURE:  Release of A1 pulley, left long finger.  SURGEON:  Joni Fears, MD  ASSISTANT:  None.  ANESTHESIA:  Combination 1% Xylocaine without epinephrine and 0.25% Marcaine with epinephrine.  COMPLICATIONS:  None.  DESCRIPTION OF PROCEDURE:  The patient was met in the holding area with his wife.  I identified the left long finger was the appropriate operative site.  He could actively trigger the finger.  This was marked accordingly.  The patient was then transported to room #1 and carefully placed on the operating room table.  The left upper extremity was then prepped with DuraPrep from the tips of the fingers to the elbow.  Sterile draping was performed.  A timeout was called.  A marking pen was used to outline a skin incision along the transverse distal flexion crease directly over the A1 pulley of the left long finger.  There was a painful nodule at that same location.  I used a combination of 1% Xylocaine without epinephrine and 0.25% Marcaine with epinephrine.  This was infiltrated along the incision.  A #15 blade knife was used to make about 1/2-inch incision via sharp dissection carried down to the subcutaneous  tissue.  Then, by blunt dissection, the soft tissue was elevated from the flexor tendon sheath.  Blunt retractors were inserted.  I could clearly visualize the flexor tendon.  This was incised with a #15 blade knife and then I released the A1 pulley  longitudinally with blunt tipped scissors.  There was obvious constriction at that point.  There was no fluid.  The tendon otherwise appeared to be intact.  At that point, the patient could not  actively trigger the finger.  The wound was irrigated with saline solution.  I closed the wound with 4-0 Ethilon.  A sterile bulky dressing was applied.  PLAN:  We plan to see him in the office on Friday.  Hydrocodone with Tylenol for pain.  TN/NUANCE  D:01/15/2018 T:01/15/2018 JOB:[id number]

## 2018-01-15 NOTE — Progress Notes (Signed)
The recent History & Physical has been reviewed. I have personally examined the patient today. There is no interval change to the documented History & Physical. The patient would like to proceed with the procedure.  [name] 01/15/2018,  7:17 AM  [name], male   DOB: 02/24/1956, 62 y.o.   MRN: [id number]

## 2018-01-16 ENCOUNTER — Encounter (HOSPITAL_BASED_OUTPATIENT_CLINIC_OR_DEPARTMENT_OTHER): Payer: Self-pay | Admitting: Orthopaedic Surgery

## 2018-01-17 MED FILL — LEVEMIR FLEXTOUCH 100 UNITS: 100 | 25 days supply | Qty: 3 | Fill #4

## 2018-01-18 ENCOUNTER — Ambulatory Visit (INDEPENDENT_AMBULATORY_CARE_PROVIDER_SITE_OTHER): Payer: 59 | Admitting: Orthopaedic Surgery

## 2018-01-18 ENCOUNTER — Encounter (INDEPENDENT_AMBULATORY_CARE_PROVIDER_SITE_OTHER): Payer: Self-pay | Admitting: Orthopaedic Surgery

## 2018-01-18 DIAGNOSIS — M779 Enthesopathy, unspecified: Secondary | ICD-10-CM

## 2018-01-18 DIAGNOSIS — M778 Other enthesopathies, not elsewhere classified: Secondary | ICD-10-CM

## 2018-01-18 NOTE — Progress Notes (Signed)
Office Visit Note   Patient: Albert [name]           Date of Birth: 06/13/1956           MRN: [id number] Visit Date: 01/18/2018              Requested by: Mosie Lukes, MD [address] [place], [place] [address] PCP: Mosie Lukes, MD   Assessment & Plan: Visit Diagnoses:  1. Tendinitis of finger of left hand     Plan: 3 days status post left long trigger finger release.  Doing well.  Dressing changed and waterproof Band-Aid applied.  Will check again in 10 days and plan on removing the stitches  Follow-Up Instructions: Return in about 1 week (around 01/25/2018).   Orders:  No orders of the defined types were placed in this encounter.  No orders of the defined types were placed in this encounter.     Procedures: No procedures performed   Clinical Data: No additional findings.   Subjective: No chief complaint on file. 3 days status post release of a left long trigger finger.  Doing well.  No related problems  HPI  Review of Systems   Objective: Vital Signs: There were no vitals taken for this visit.  Physical Exam  Ortho Exam awake alert and oriented x3.  Comfortable sitting.  Dressing removed from left hand.  No swelling of digit neurovascular exam intact.  Not able to trigger the long finger.  Incision healing without a problem.  Waterproof Band-Aid applied will check again in approximately 10 days.  Okay to shower as long as he has the Band-Aid in place  Specialty Comments:  No specialty comments available.  Imaging: No results found.   PMFS History: Patient Active Problem List   Diagnosis Date Noted  . Trigger finger, left middle finger 12/25/2017  . Muscle spasm 10/31/2017  . Trigger finger 05/03/2017  . Elbow pain, chronic, right 08/13/2016  . Tendinitis of finger of left hand 07/31/2016  . Tendinitis of right triceps 07/31/2016  . 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 Atypical chest pain 11/01/2014  .  Hyperlipidemia, mixed 07/26/2014  . 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 Preventative health care 07/26/2014  . BPH (benign prostatic hyperplasia) 12/11/2013  . Diverticulosis 10/26/2013  . Acute neck pain 06/04/2013  . Insomnia 02/25/2013  . Sciatica of right side 12/22/2012  . HTN (hypertension) 11/22/2012  . DM (diabetes mellitus), type 2 with neurological complications ([place]) 85/09/7739  . Lumbar radiculopathy 02/09/2012   Past Medical History:  Diagnosis Date  . Colon polyps    adenomatous  . Diverticulosis 10/26/2013  . DM (diabetes mellitus), type 2 with neurological complications ([place]) 2/87/8676  . 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 History of chicken pox    childhood  . History of kidney stones   . Hyperlipidemia 07/26/2014  . Hypertension   . Insomnia 02/25/2013  . Preventative health care 07/26/2014  . Renal lithiasis 02/21/2015  . Sciatica of right side 12/22/2012  . Seasonal allergies   . 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 Trigger finger 05/03/2017    Family History  Problem Relation Age of Onset  . Lung cancer Mother   . Diabetes Maternal Aunt   . Cancer Paternal Grandmother   . Breast cancer Neg Hx   . Colon cancer Neg Hx   . Prostate cancer Neg  Hx   . Heart disease Neg Hx     Past Surgical History:  Procedure Laterality Date  . ABDOMINAL HERNIA REPAIR    . CHOLECYSTECTOMY    . HERNIA REPAIR    . LAPAROSCOPIC APPENDECTOMY N/A 08/16/2014   Procedure: APPENDECTOMY LAPAROSCOPIC;  Surgeon: Erroll Luna, MD;  Location: [facility];  Service: General;  Laterality: N/A;  . MASS EXCISION  05/22/2012   Procedure: EXCISION MASS;  Surgeon: Joyice Faster. Cornett, MD;  Location: [facility];  Service: General;  Laterality: Left;  excision abdominal wall mass  . ROTATOR CUFF REPAIR    . 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 Smoking status: Never Smoker  . Smokeless tobacco: Never Used  Substance and Sexual Activity  . Alcohol use: No  . Drug use: No  . Sexual activity: Yes     [name], MD   Note - This record has been created using [employer].  Chart creation errors have been sought, but may not always  have been located. Such creation errors do not reflect on  the standard of medical care.

## 2018-01-28 ENCOUNTER — Inpatient Hospital Stay (INDEPENDENT_AMBULATORY_CARE_PROVIDER_SITE_OTHER): Payer: 59 | Admitting: Orthopaedic Surgery

## 2018-01-28 ENCOUNTER — Encounter (INDEPENDENT_AMBULATORY_CARE_PROVIDER_SITE_OTHER): Payer: Self-pay | Admitting: Orthopaedic Surgery

## 2018-01-28 ENCOUNTER — Ambulatory Visit (INDEPENDENT_AMBULATORY_CARE_PROVIDER_SITE_OTHER): Payer: 59 | Admitting: Orthopaedic Surgery

## 2018-01-28 VITALS — BP 141/83 | HR 105 | Resp 14 | Ht 72.0 in | Wt 184.0 lb

## 2018-01-28 DIAGNOSIS — M779 Enthesopathy, unspecified: Secondary | ICD-10-CM

## 2018-01-28 DIAGNOSIS — M778 Other enthesopathies, not elsewhere classified: Secondary | ICD-10-CM

## 2018-01-28 NOTE — Progress Notes (Signed)
Office Visit Note   Patient: Albert [name]           Date of Birth: August 17, 1955           MRN: [id number] Visit Date: 01/28/2018              Requested by: Mosie Lukes, MD [address] [place], [place] [address] PCP: Mosie Lukes, MD   Assessment & Plan: Visit Diagnoses:  1. Tendinitis of finger of left hand     13 days status post release of left long trigger finger and doing well.  Working without a problem.  Stitches removed.  Wounds healing without a problem.  We will plan to see back as needed  Follow-Up Instructions: Return if symptoms worsen or fail to improve.   Orders:  No orders of the defined types were placed in this encounter.  No orders of the defined types were placed in this encounter.     Procedures: No procedures performed   Clinical Data: No additional findings.   Subjective: Chief Complaint  Patient presents with  . Left Hand - Routine Post Op  . Post-op Follow-up    Left trigger finger surgery, doing good, 01/15/18    HPI  Review of Systems   Objective: Vital Signs: BP (!) 141/83 (BP Location: Left Arm, Patient Position: Sitting, Cuff Size: Normal)   Pulse (!) 105   Resp 14   Ht 6' (1.829 m)   Wt 184 lb (83.5 kg)   BMI 24.95 kg/m   Physical Exam  Ortho Exam awake alert and oriented x3.  Comfortable sitting.  Incision palm of left hand is healing without problem.  No longer can trigger long finger.  No swelling.  Neurovascular exam intact.  Full flexion extension of the long finger  Specialty Comments:  No specialty comments available.  Imaging: No results found.   PMFS History: Patient Active Problem List   Diagnosis Date Noted  . Trigger finger, left middle finger 12/25/2017  . Muscle spasm 10/31/2017  . Trigger finger 05/03/2017  . Elbow pain, chronic, right 08/13/2016  . Tendinitis of finger of left hand 07/31/2016  . Tendinitis of right triceps 07/31/2016  . ED (erectile dysfunction) of organic  origin 10/07/2015  . Atypical chest pain 11/01/2014  . Hyperlipidemia, mixed 07/26/2014  . 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 Preventative health care 07/26/2014  . BPH (benign prostatic hyperplasia) 12/11/2013  . Diverticulosis 10/26/2013  . Acute neck pain 06/04/2013  . Insomnia 02/25/2013  . Sciatica of right side 12/22/2012  . HTN (hypertension) 11/22/2012  . DM (diabetes mellitus), type 2 with neurological complications ([facility] [facility]) 03/50/0938  . Lumbar radiculopathy 02/09/2012   Past Medical History:  Diagnosis Date  . Colon polyps    adenomatous  . Diverticulosis 10/26/2013  . DM (diabetes mellitus), type 2 with neurological complications ([place]) 1/82/9937  . 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 History of chicken pox    childhood  . History of kidney stones   . Hyperlipidemia 07/26/2014  . Hypertension   . Insomnia 02/25/2013  . Preventative health care 07/26/2014  . Renal lithiasis 02/21/2015  . Sciatica of right side 12/22/2012  . Seasonal allergies   . 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 Trigger finger 05/03/2017    Family History  Problem Relation Age of Onset  . Lung cancer Mother   . Diabetes Maternal Aunt   .  Cancer Paternal Grandmother   . Breast cancer Neg Hx   . Colon cancer Neg Hx   . Prostate cancer Neg Hx   . Heart disease Neg Hx     Past Surgical History:  Procedure Laterality Date  . ABDOMINAL HERNIA REPAIR    . CHOLECYSTECTOMY    . HAND SURGERY    . HERNIA REPAIR    . LAPAROSCOPIC APPENDECTOMY N/A 08/16/2014   Procedure: APPENDECTOMY LAPAROSCOPIC;  Surgeon: Erroll Luna, MD;  Location: [facility];  Service: General;  Laterality: N/A;  . MASS EXCISION  05/22/2012   Procedure: EXCISION MASS;  Surgeon: Joyice Faster. Cornett, MD;  Location: [facility];  Service: General;  Laterality: Left;  excision abdominal wall mass  . ROTATOR CUFF REPAIR    . SHOULDER  ARTHROSCOPY    . 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 Smoking status: Never Smoker  . Smokeless tobacco: Never Used  Substance and Sexual Activity  . Alcohol use: No  . Drug use: No  . Sexual activity: Yes     [name], MD   Note - This record has been created using [employer].  Chart creation errors have been sought, but may not always  have been located. Such creation errors do not reflect on  the standard of medical care.

## 2018-01-31 ENCOUNTER — Other Ambulatory Visit: Payer: Self-pay | Admitting: Family Medicine

## 2018-01-31 DIAGNOSIS — M62838 Other muscle spasm: Secondary | ICD-10-CM

## 2018-01-31 DIAGNOSIS — M5431 Sciatica, right side: Secondary | ICD-10-CM

## 2018-01-31 MED FILL — INVOKANA 300 MG TABLET: 300 | 30 days supply | Qty: 30 | Fill #1

## 2018-01-31 MED FILL — tiZANidine HCL 4 MG TABS: 4 | 15 days supply | Qty: 30 | Fill #0

## 2018-01-31 MED FILL — AMITRIPTYLINE HCL 10 MG TAB: 10 | 30 days supply | Qty: 60 | Fill #1

## 2018-02-05 MED FILL — VICTOZA 18 MG/3 ML INJECT P: 18 | 30 days supply | Qty: 9 | Fill #3

## 2018-02-13 ENCOUNTER — Encounter: Payer: Self-pay | Admitting: Internal Medicine

## 2018-02-13 ENCOUNTER — Ambulatory Visit: Payer: 59 | Admitting: Internal Medicine

## 2018-02-13 VITALS — BP 122/76 | HR 105 | Temp 97.6°F | Resp 16 | Ht 72.0 in | Wt 191.5 lb

## 2018-02-13 DIAGNOSIS — R21 Rash and other nonspecific skin eruption: Secondary | ICD-10-CM | POA: Diagnosis not present

## 2018-02-13 MED FILL — LEVEMIR FLEXTOUCH 100 UNITS: 100 | 25 days supply | Qty: 3 | Fill #5

## 2018-02-13 NOTE — Progress Notes (Signed)
Pre visit review using our clinic review tool, if applicable. No additional management support is needed unless otherwise documented below in the visit note. 

## 2018-02-13 NOTE — Progress Notes (Signed)
Subjective:    [name], male    DOB: 1956-06-02, 62 y.o.   MRN: [id number]  DOS:  02/13/2018 Type of visit - description : Acute visit Interval history: He came back from the beach 02/10/2017, 2 days later he developed a rash at the pretibial area, worse on the right. Denies any itching or swelling   Review of Systems Denies fever or chills No blood in the stools or in the urine No chest pain, difficulty breathing or palpitation.   Past Medical History:  Diagnosis Date  . Colon polyps    adenomatous  . Diverticulosis 10/26/2013  . DM (diabetes mellitus), type 2 with neurological complications ([place]) 8/33/8250  . 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 History of chicken pox    childhood  . History of kidney stones   . Hyperlipidemia 07/26/2014  . Hypertension   . Insomnia 02/25/2013  . Preventative health care 07/26/2014  . Renal lithiasis 02/21/2015  . Sciatica of right side 12/22/2012  . Seasonal allergies   . 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 Trigger finger 05/03/2017    Past Surgical History:  Procedure Laterality Date  . ABDOMINAL HERNIA REPAIR    . CHOLECYSTECTOMY    . HAND SURGERY    . HERNIA REPAIR    . LAPAROSCOPIC APPENDECTOMY N/A 08/16/2014   Procedure: APPENDECTOMY LAPAROSCOPIC;  Surgeon: Erroll Luna, MD;  Location: [facility];  Service: General;  Laterality: N/A;  . MASS EXCISION  05/22/2012   Procedure: EXCISION MASS;  Surgeon: Joyice Faster. Cornett, MD;  Location: [facility];  Service: General;  Laterality: Left;  excision abdominal wall mass  . ROTATOR CUFF REPAIR    . SHOULDER ARTHROSCOPY    . TRIGGER FINGER RELEASE Left 01/15/2018   Procedure: LEFT TRIGGER FINGER A-1 PULLEY RELEASE;  Surgeon: [name], MD;  Location: [facility];  Service: Orthopedics;  Laterality: Left;    Social History   Socioeconomic History  . Marital status: Married   Spouse name: Not on file  . Number of children: 3  . Years of education: Not on file  . 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 Financial resource strain: Not on file  . Food insecurity:    Worry: Not on file    Inability: Not on file  . Transportation needs:    Medical: Not on file    Non-medical: Not on file  Tobacco Use  . Smoking status: Never Smoker  . Smokeless tobacco: Never Used  Substance and Sexual Activity  . Alcohol use: No  . Drug use: No  . Sexual activity: Yes  Lifestyle  . Physical activity:    Days per week: Not on file    Minutes per session: Not on file  . Stress: Not on file  Relationships  . Social connections:    Talks on phone: Not on file    Gets together: Not on file    Attends religious service: Not on file    Active member of club or organization: Not on file    Attends meetings of clubs or organizations: Not on file    Relationship status: Not on file  . Intimate partner violence:    Fear of current or ex partner: Not on file    Emotionally abused: Not on file    Physically abused:  Not on file    Forced sexual activity: Not on file  Other Topics Concern  . Not on file  Social History Narrative  . Not on file      Allergies as of 02/13/2018      Reactions   Contrast Media [iodinated Diagnostic Agents] Swelling   Can take if premedicated with diphenhydramine   Iodine Swelling   Metformin And Related Diarrhea      Medication List        Accurate as of 02/13/18 11:59 PM. Always use your most recent med list.          ACCU-CHEK GUIDE test strip Generic drug:  glucose blood USE TO TEST BLOOD SUGAR DAILY AT VARIABLE TIMES   amitriptyline 10 MG tablet Commonly known as:  ELAVIL Take 1-2 tablets (10-20 mg total) by mouth at bedtime.   aspirin EC 81 MG tablet Take 1 tablet (81 mg total) by mouth daily.   fluticasone 50 MCG/ACT nasal spray Commonly known as:   FLONASE Place 2 sprays into both nostrils daily.   Insulin Pen Needle 31G X 8 MM Misc Use to inject medication 3 times per day.   INVOKANA 300 MG Tabs tablet Generic drug:  canagliflozin TAKE 1 TABLET (300 MG TOTAL) BY MOUTH DAILY BEFORE BREAKFAST.   LEVEMIR FLEXTOUCH 100 UNIT/ML Pen Generic drug:  Insulin Detemir Inject 16 Units into the skin daily at 10 pm.   loperamide 2 MG tablet Commonly known as:  IMODIUM A-D Take 2 mg by mouth 4 (four) times daily as needed for diarrhea or loose stools.   MULTIPLE VITAMIN PO Take 1 packet by mouth daily.   sodium chloride 0.65 % Soln nasal spray Commonly known as:  OCEAN Place 1 spray into both nostrils as needed for congestion.   tiZANidine 4 MG tablet Commonly known as:  ZANAFLEX TAKE 1/2-1 TABLET (2-4 MG TOTAL) BY MOUTH 2 (TWO) TIMES DAILY AS NEEDED FOR MUSCLE SPASMS.   Turmeric Curcumin 500 MG Caps Take 1 capsule by mouth daily.   VICTOZA 18 MG/3ML Sopn Generic drug:  liraglutide INJECT 0.3 MLS (1.8 MG TOTAL) INTO THE SKIN DAILY.          Objective:   Physical Exam BP 122/76 (BP Location: Left Arm, Patient Position: Sitting, Cuff Size: Small)   Pulse (!) 105   Temp 97.6 F (36.4 C) (Oral)   Resp 16   Ht 6' (1.829 m)   Wt 191 lb 8 oz (86.9 kg)   SpO2 96%   BMI 25.97 kg/m  General:   Well developed, NAD, see BMI.  HEENT:  Normocephalic . Face symmetric, atraumatic Lower extremities: Trace edema, varicose veins noted, no phlebitis on exam. Skin: Has a superficial, flat rash at the pretibial area, more noticeable on the right.  See picture Neurologic:  alert & oriented X3.  Speech normal, gait appropriate for age and unassisted Psych--  Cognition and judgment appear intact.  Cooperative with normal attention span and concentration.  Behavior appropriate. No anxious or depressed appearing.        Assessment & Plan:   62 year old male, PMH  includes diabetes, BPH, hyperlipidemia, HTN, presents with Rash:  As described above, suspect a benign condition probably related to varicose veins and recent trip, no calf pain..  To be sure will check a CBC to rule out thrombocytopenia.  If that is negative will recommend observation.

## 2018-02-13 NOTE — Patient Instructions (Signed)
GO TO THE LAB : Get the blood work     If the rash gets worse, you have any unusual bleeding or other rashes please let us know  Otherwise he should go gradually back to normal.

## 2018-02-14 LAB — CBC WITH DIFFERENTIAL/PLATELET
BASOS ABS: 70 {cells}/uL (ref 0–200)
Basophils Relative: 1.1 %
EOS ABS: 282 {cells}/uL (ref 15–500)
Eosinophils Relative: 4.4 %
HCT: 46.3 % (ref 38.5–50.0)
Hemoglobin: 16.2 g/dL (ref 13.2–17.1)
Lymphs Abs: 2016 cells/uL (ref 850–3900)
MCH: 30.6 pg (ref 27.0–33.0)
MCHC: 35 g/dL (ref 32.0–36.0)
MCV: 87.5 fL (ref 80.0–100.0)
MONOS PCT: 9.8 %
MPV: 10.3 fL (ref 7.5–12.5)
NEUTROS PCT: 53.2 %
Neutro Abs: 3405 cells/uL (ref 1500–7800)
PLATELETS: 215 10*3/uL (ref 140–400)
RBC: 5.29 10*6/uL (ref 4.20–5.80)
RDW: 13.2 % (ref 11.0–15.0)
TOTAL LYMPHOCYTE: 31.5 %
WBC: 6.4 10*3/uL (ref 3.8–10.8)
WBCMIX: 627 {cells}/uL (ref 200–950)

## 2018-02-19 MED FILL — tiZANidine HCL 4 MG TABS: 4 | 15 days supply | Qty: 30 | Fill #1

## 2018-02-28 MED FILL — INVOKANA 300 MG TABLET: 300 | 30 days supply | Qty: 30 | Fill #2

## 2018-03-05 ENCOUNTER — Other Ambulatory Visit: Payer: Self-pay | Admitting: Family Medicine

## 2018-03-05 DIAGNOSIS — M62838 Other muscle spasm: Secondary | ICD-10-CM

## 2018-03-05 DIAGNOSIS — M5431 Sciatica, right side: Secondary | ICD-10-CM

## 2018-03-05 MED FILL — AMITRIPTYLINE HCL 10 MG TAB: 10 | 30 days supply | Qty: 60 | Fill #0

## 2018-03-07 MED FILL — tiZANidine HCL 4 MG TABS: 4 | 15 days supply | Qty: 30 | Fill #2

## 2018-03-18 ENCOUNTER — Other Ambulatory Visit: Payer: Self-pay | Admitting: Endocrinology

## 2018-03-18 MED FILL — VICTOZA 18 MG/3 ML INJECT P: 18 | 30 days supply | Qty: 9 | Fill #0

## 2018-03-18 MED FILL — LEVEMIR FLEXTOUCH 100 UNITS: 100 | 25 days supply | Qty: 3 | Fill #6

## 2018-03-21 ENCOUNTER — Other Ambulatory Visit: Payer: Self-pay | Admitting: Family Medicine

## 2018-03-21 DIAGNOSIS — M62838 Other muscle spasm: Secondary | ICD-10-CM

## 2018-03-21 DIAGNOSIS — M5431 Sciatica, right side: Secondary | ICD-10-CM

## 2018-03-21 MED FILL — tiZANidine HCL 4 MG TABS: 4 | 15 days supply | Qty: 30 | Fill #0

## 2018-04-01 MED FILL — INVOKANA 300 MG TABLET: 300 | 30 days supply | Qty: 30 | Fill #3

## 2018-04-10 MED FILL — AMITRIPTYLINE HCL 10 MG TAB: 10 | 30 days supply | Qty: 60 | Fill #1

## 2018-04-10 MED FILL — tiZANidine HCL 4 MG TABS: 4 | 15 days supply | Qty: 30 | Fill #1

## 2018-04-24 ENCOUNTER — Ambulatory Visit: Payer: 59 | Admitting: Medical

## 2018-04-24 ENCOUNTER — Encounter: Payer: Self-pay | Admitting: Medical

## 2018-04-24 VITALS — BP 137/86 | HR 87 | Temp 97.9°F | Resp 16 | Ht 72.0 in | Wt 189.0 lb

## 2018-04-24 DIAGNOSIS — R319 Hematuria, unspecified: Secondary | ICD-10-CM | POA: Diagnosis not present

## 2018-04-24 DIAGNOSIS — Z87442 Personal history of urinary calculi: Secondary | ICD-10-CM | POA: Diagnosis not present

## 2018-04-24 DIAGNOSIS — R109 Unspecified abdominal pain: Secondary | ICD-10-CM

## 2018-04-24 DIAGNOSIS — E119 Type 2 diabetes mellitus without complications: Secondary | ICD-10-CM

## 2018-04-24 DIAGNOSIS — R972 Elevated prostate specific antigen [PSA]: Secondary | ICD-10-CM

## 2018-04-24 LAB — CBC WITH DIFFERENTIAL/PLATELET
BASOS PCT: 0.8 % (ref 0.0–3.0)
Basophils Absolute: 0 10*3/uL (ref 0.0–0.1)
EOS PCT: 4.6 % (ref 0.0–5.0)
Eosinophils Absolute: 0.3 10*3/uL (ref 0.0–0.7)
HCT: 49 % (ref 39.0–52.0)
Hemoglobin: 16.9 g/dL (ref 13.0–17.0)
Lymphocytes Relative: 37 % (ref 12.0–46.0)
Lymphs Abs: 2.2 10*3/uL (ref 0.7–4.0)
MCHC: 34.5 g/dL (ref 30.0–36.0)
MCV: 88.8 fl (ref 78.0–100.0)
MONO ABS: 0.6 10*3/uL (ref 0.1–1.0)
Monocytes Relative: 9.8 % (ref 3.0–12.0)
NEUTROS ABS: 2.8 10*3/uL (ref 1.4–7.7)
Neutrophils Relative %: 47.8 % (ref 43.0–77.0)
PLATELETS: 203 10*3/uL (ref 150.0–400.0)
RBC: 5.52 Mil/uL (ref 4.22–5.81)
RDW: 13.6 % (ref 11.5–15.5)
WBC: 5.9 10*3/uL (ref 4.0–10.5)

## 2018-04-24 LAB — POC URINALSYSI DIPSTICK (AUTOMATED)
BILIRUBIN UA: NEGATIVE
Glucose, UA: POSITIVE — AB
Ketones, UA: NEGATIVE
LEUKOCYTES UA: NEGATIVE
NITRITE UA: NEGATIVE
PH UA: 6 (ref 5.0–8.0)
Protein, UA: NEGATIVE
Spec Grav, UA: 1.025 (ref 1.010–1.025)
UROBILINOGEN UA: NEGATIVE U/dL — AB

## 2018-04-24 LAB — COMPREHENSIVE METABOLIC PANEL
ALT: 28 U/L (ref 0–53)
AST: 17 U/L (ref 0–37)
Albumin: 4.4 g/dL (ref 3.5–5.2)
Alkaline Phosphatase: 85 U/L (ref 39–117)
BUN: 14 mg/dL (ref 6–23)
CALCIUM: 9.9 mg/dL (ref 8.4–10.5)
CHLORIDE: 98 meq/L (ref 96–112)
CO2: 28 meq/L (ref 19–32)
CREATININE: 0.92 mg/dL (ref 0.40–1.50)
GFR: 89.21 mL/min (ref >60.00)
Glucose, Bld: 159 mg/dL — ABNORMAL HIGH (ref 70–99)
POTASSIUM: 4.2 meq/L (ref 3.5–5.1)
SODIUM: 133 meq/L — AB (ref 135–145)
Total Bilirubin: 0.6 mg/dL (ref 0.2–1.2)
Total Protein: 8 g/dL (ref 6.0–8.3)

## 2018-04-24 LAB — LIPASE: LIPASE: 14 U/L (ref 11.0–59.0)

## 2018-04-24 LAB — AMYLASE: AMYLASE: 31 U/L (ref 27–131)

## 2018-04-24 MED ORDER — HYDROCODONE-ACETAMINOPHEN 5-325 MG PO TABS: ORAL_TABLET | ORAL | 0 refills | Status: DC

## 2018-04-24 MED FILL — HYDROCODON-APAP 5-325: 5-325 | 7 days supply | Qty: 30 | Fill #0

## 2018-04-24 NOTE — Progress Notes (Signed)
Subjective:    [name], male    DOB: 05-19-1956, 62 y.o.   MRN: [id number]  HPI  Pt in with some burning on urination  and some blood in his urine since this morning. Pt states he did have kidney stone in past once but no back pain. Last year had kidney stone type pain with blood and he states he urinated and heard stone hit toilet. But this event does not present like a stone in terms of pain.  Pt states pain at tip of penis when urinates and gross blood.  No fever, no chills or sweats. No history of smoking.  Pt did drink 2 bottle of water today on way over to office.   No testicle pain.  No abdomen pain reported before exam. No back pain reported.   Review of Systems  Respiratory: Negative for cough, chest tightness, shortness of breath and wheezing.   Cardiovascular: Negative for chest pain and palpitations.  Gastrointestinal: Positive for abdominal pain. Negative for nausea and vomiting.       2 loose stools. Just started today. He ate salad today. States can occur when he eats salads.  Genitourinary: Positive for dysuria and hematuria. Negative for decreased urine volume, frequency, penile swelling, scrotal swelling, testicular pain and urgency.  Musculoskeletal: Negative for back pain and neck stiffness.  Skin: Negative for rash.  Psychiatric/Behavioral: Negative for behavioral problems and decreased concentration.    Past Medical History:  Diagnosis Date  . Colon polyps    adenomatous  . Diverticulosis 10/26/2013  . DM (diabetes mellitus), type 2 with neurological complications ([place]) 04/07/538  . 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 History of chicken pox    childhood  . History of kidney stones   . Hyperlipidemia 07/26/2014  . Hypertension   . Insomnia 02/25/2013  . Preventative health care 07/26/2014  . Renal lithiasis 02/21/2015  . Sciatica of right side 12/22/2012  . Seasonal allergies   . Squamous cell carcinoma in  situ 2014   forehead, s/p excision by derm  . Trigger finger 05/03/2017     Social History   Socioeconomic History  . Marital status: Married    Spouse name: Not on file  . Number of children: 3  . Years of education: Not on file  . 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 Financial resource strain: Not on file  . Food insecurity:    Worry: Not on file    Inability: Not on file  . Transportation needs:    Medical: Not on file    Non-medical: Not on file  Tobacco Use  . Smoking status: Never Smoker  . Smokeless tobacco: Never Used  Substance and Sexual Activity  . Alcohol use: No  . Drug use: No  . Sexual activity: Yes  Lifestyle  . Physical activity:    Days per week: Not on file    Minutes per session: Not on file  . Stress: Not on file  Relationships  . Social connections:    Talks on phone: Not on file    Gets together: Not on file    Attends religious service: Not on file    Active member of club or organization: Not on file    Attends meetings of clubs or organizations: Not on file    Relationship status: Not on file  . Intimate partner violence:    Fear  of current or ex partner: Not on file    Emotionally abused: Not on file    Physically abused: Not on file    Forced sexual activity: Not on file  Other Topics Concern  . Not on file  Social History Narrative  . Not on file    Past Surgical History:  Procedure Laterality Date  . ABDOMINAL HERNIA REPAIR    . CHOLECYSTECTOMY    . HAND SURGERY    . HERNIA REPAIR    . LAPAROSCOPIC APPENDECTOMY N/A 08/16/2014   Procedure: APPENDECTOMY LAPAROSCOPIC;  Surgeon: Erroll Luna, MD;  Location: [facility];  Service: General;  Laterality: N/A;  . MASS EXCISION  05/22/2012   Procedure: EXCISION MASS;  Surgeon: Joyice Faster. Cornett, MD;  Location: [facility];  Service: General;  Laterality: Left;  excision abdominal wall mass  . ROTATOR CUFF  REPAIR    . SHOULDER ARTHROSCOPY    . TRIGGER FINGER RELEASE Left 01/15/2018   Procedure: LEFT TRIGGER FINGER A-1 PULLEY RELEASE;  Surgeon: [name], MD;  Location: [facility];  Service: Orthopedics;  Laterality: Left;    Family History  Problem Relation Age of Onset  . Lung cancer Mother   . Diabetes Maternal Aunt   . Cancer Paternal Grandmother   . Breast cancer Neg Hx   . Colon cancer Neg Hx   . Prostate cancer Neg Hx   . Heart disease Neg Hx     Allergies  Allergen Reactions  . Contrast Media [Iodinated Diagnostic Agents] Swelling    Can take if premedicated with diphenhydramine  . Iodine Swelling  . Metformin And Related Diarrhea    Current Outpatient Medications on File Prior to Visit  Medication Sig Dispense Refill  . ACCU-CHEK GUIDE test strip USE TO TEST BLOOD SUGAR DAILY AT VARIABLE TIMES 50 each 4  . amitriptyline (ELAVIL) 10 MG tablet Take 1-2 tablets (10-20 mg total) by mouth at bedtime. 60 tablet 3  . aspirin EC 81 MG tablet Take 1 tablet (81 mg total) by mouth daily.    . fluticasone (FLONASE) 50 MCG/ACT nasal spray Place 2 sprays into both nostrils daily. (Patient taking differently: Place 2 sprays into both nostrils daily as needed. ) 16 g 1  . Insulin Detemir (LEVEMIR FLEXTOUCH) 100 UNIT/ML Pen Inject 16 Units into the skin daily at 10 pm. 15 mL 1  . Insulin Pen Needle 31G X 8 MM MISC Use to inject medication 3 times per day. 300 each 2  . INVOKANA 300 MG TABS tablet TAKE 1 TABLET (300 MG TOTAL) BY MOUTH DAILY BEFORE BREAKFAST. 30 tablet 3  . loperamide (IMODIUM A-D) 2 MG tablet Take 2 mg by mouth 4 (four) times daily as needed for diarrhea or loose stools.    . MULTIPLE VITAMIN PO Take 1 packet by mouth daily.    . sodium chloride (OCEAN) 0.65 % SOLN nasal spray Place 1 spray into both nostrils as needed for congestion.    Marland Kitchen tiZANidine (ZANAFLEX) 4 MG tablet TAKE 1/2-1 TABLET BY MOUTH 2 TIMES DAILY AS NEEDED FOR MUSCLE SPASMS. 30 tablet 2   . Turmeric Curcumin 500 MG CAPS Take 1 capsule by mouth daily.    Marland Kitchen VICTOZA 18 MG/3ML SOPN INJECT 0.3 MLS (1.8 MG TOTAL) INTO THE SKIN DAILY. 9 mL 3   No current facility-administered medications on file prior to visit.     BP 137/86   Pulse 87   Temp 97.9 F (36.6 C) (Oral)  Resp 16   Ht 6' (1.829 m)   Wt 189 lb (85.7 kg)   SpO2 99%   BMI 25.63 kg/m       Objective:   Physical Exam  General Appearance- Not in acute distress.  HEENT Eyes- Scleraeral/Conjuntiva-bilat- Not Yellow. Mouth & Throat- Normal.  Chest and Lung Exam Auscultation: Breath sounds:-Normal. Adventitious sounds:- No Adventitious sounds.  Cardiovascular Auscultation:Rythm - Regular. Heart Sounds -Normal heart sounds.  Abdomen Inspection:-Inspection Normal.  Palpation/Perucssion: Palpation and Percussion of the abdomen reveal- left side abdomen pain faint on exam. Pt did not notice until I palpated Tender, No Rebound tenderness, No rigidity(Guarding) and No Palpable abdominal masses.  Liver:-Normal.  Spleen:- Normal.   Back- no cva tenderness.  .     Assessment & Plan:  For your history of recent gross hematuria, dysuria, history of kidney stones and some left side abdomen discomfort on exam, I am getting CBC, CMP, amylase, lipase, PSA, urine culture and a CT renal stone protocol.  Stay well-hydrated.  If your abdomen pain becomes worse pending results of studies then I am making Norco available.  If your pain were to increase severely/change after hours then recommend ED evaluation.  Currently the person who normally does all referrals/prior authorizations is out of the office.  But I did give the request to another staff member.  I asked her to try to get the prior authorization and schedule for tomorrow morning.   Follow-up in 7 days or as needed.  If with the above work-up no cause is determined but gross blood and urine were to persist then might need to refer you to a  urologist.  [phone]  Mackie Pai, PA-C

## 2018-04-24 NOTE — Patient Instructions (Signed)
For your history of recent gross hematuria, dysuria, history of kidney stones and some left side abdomen discomfort on exam, I am getting CBC, CMP, amylase, lipase, PSA, urine culture and a CT renal stone protocol.  Stay well-hydrated.  If your abdomen pain becomes worse pending results of studies then I am making Norco available.  If your pain were to increase severely/change after hours then recommend ED evaluation.  Currently the person who normally does all referrals/prior authorizations is out of the office.  But I did give the request to another staff member.  I asked her to try to get the prior authorization and schedule for tomorrow morning.   Follow-up in 7 days or as needed.  If with the above work-up no cause is determined but gross blood and urine were to persist then might need to refer you to a urologist.

## 2018-04-25 ENCOUNTER — Telehealth: Payer: Self-pay | Admitting: Medical

## 2018-04-25 ENCOUNTER — Ambulatory Visit (HOSPITAL_BASED_OUTPATIENT_CLINIC_OR_DEPARTMENT_OTHER)
Admission: RE | Admit: 2018-04-25 | Discharge: 2018-04-25 | Disposition: A | Discharge status: Home / Self Care | Payer: 59 | Source: Ambulatory Visit | Attending: Medical | Admitting: Medical

## 2018-04-25 DIAGNOSIS — R319 Hematuria, unspecified: Secondary | ICD-10-CM

## 2018-04-25 DIAGNOSIS — K439 Ventral hernia without obstruction or gangrene: Secondary | ICD-10-CM | POA: Insufficient documentation

## 2018-04-25 DIAGNOSIS — I2510 Atherosclerotic heart disease of native coronary artery without angina pectoris: Secondary | ICD-10-CM | POA: Insufficient documentation

## 2018-04-25 DIAGNOSIS — Z87442 Personal history of urinary calculi: Secondary | ICD-10-CM

## 2018-04-25 DIAGNOSIS — R109 Unspecified abdominal pain: Secondary | ICD-10-CM | POA: Diagnosis not present

## 2018-04-25 DIAGNOSIS — R310 Gross hematuria: Secondary | ICD-10-CM

## 2018-04-25 DIAGNOSIS — R972 Elevated prostate specific antigen [PSA]: Secondary | ICD-10-CM

## 2018-04-25 DIAGNOSIS — I700 Atherosclerosis of aorta: Secondary | ICD-10-CM | POA: Insufficient documentation

## 2018-04-25 LAB — URINE CULTURE
MICRO NUMBER:: 91088210
SPECIMEN QUALITY: ADEQUATE

## 2018-04-25 LAB — PSA: PSA: 1.07 ng/mL (ref 0.10–4.00)

## 2018-04-25 LAB — HEMOGLOBIN A1C: HEMOGLOBIN A1C: 9.5 % — AB (ref 4.6–6.5)

## 2018-04-25 MED ORDER — CIPROFLOXACIN HCL 500 MG PO TABS: 500.0000 mg | ORAL_TABLET | Freq: Two times a day (BID) | ORAL | 0 refills | Status: DC

## 2018-04-25 MED ORDER — METRONIDAZOLE 500 MG PO TABS: 500.0000 mg | ORAL_TABLET | Freq: Three times a day (TID) | ORAL | 0 refills | Status: DC

## 2018-04-25 NOTE — Telephone Encounter (Signed)
Sent cipro and flagyl to pt pharmacy.After discussing with him slight more abdomen pain today. More than on yesterday exam.  Cover for uti, possible prostatis and diverticulitis. Ct done renal stone protocol yesterday. No kidney stone seen.

## 2018-04-25 NOTE — Telephone Encounter (Signed)
Opened to review 

## 2018-04-26 ENCOUNTER — Encounter: Payer: Self-pay | Admitting: Medical

## 2018-04-26 ENCOUNTER — Telehealth: Payer: Self-pay | Admitting: Medical

## 2018-04-26 ENCOUNTER — Other Ambulatory Visit (INDEPENDENT_AMBULATORY_CARE_PROVIDER_SITE_OTHER): Payer: 59

## 2018-04-26 DIAGNOSIS — R109 Unspecified abdominal pain: Secondary | ICD-10-CM

## 2018-04-26 MED FILL — CIPROFLOXACIN HCL 500 MG TA: 500 | 7 days supply | Qty: 14 | Fill #0

## 2018-04-26 MED FILL — metroNIDAZOLE 500 MG TABS: 500 | 7 days supply | Qty: 21 | Fill #0

## 2018-04-26 NOTE — Telephone Encounter (Signed)
Placed stat cbc, cmp and sed rate today. See result note.

## 2018-04-26 NOTE — Telephone Encounter (Signed)
Labs placed to be done today. Stat.

## 2018-04-27 LAB — COMPREHENSIVE METABOLIC PANEL
AG Ratio: 1.4 (calc) (ref 1.0–2.5)
ALT: 23 U/L (ref 9–46)
AST: 20 U/L (ref 10–35)
Albumin: 4.2 g/dL (ref 3.6–5.1)
Alkaline phosphatase (APISO): 87 U/L (ref 40–115)
BILIRUBIN TOTAL: 0.6 mg/dL (ref 0.2–1.2)
BUN: 13 mg/dL (ref 7–25)
CALCIUM: 10 mg/dL (ref 8.6–10.3)
CHLORIDE: 98 mmol/L (ref 98–110)
CO2: 24 mmol/L (ref 20–32)
Creat: 0.97 mg/dL (ref 0.70–1.33)
GLOBULIN: 3.1 g/dL (ref 1.9–3.7)
Glucose, Bld: 187 mg/dL — ABNORMAL HIGH (ref 65–99)
Potassium: 4.2 mmol/L (ref 3.5–5.3)
SODIUM: 133 mmol/L — AB (ref 135–146)
TOTAL PROTEIN: 7.3 g/dL (ref 6.1–8.1)

## 2018-04-27 LAB — CBC WITH DIFFERENTIAL/PLATELET
BASOS ABS: 50 {cells}/uL (ref 0–200)
Basophils Relative: 0.7 %
EOS ABS: 320 {cells}/uL (ref 15–500)
Eosinophils Relative: 4.5 %
HCT: 48.4 % (ref 38.5–50.0)
Hemoglobin: 16.8 g/dL (ref 13.2–17.1)
Lymphs Abs: 2201 cells/uL (ref 850–3900)
MCH: 30.3 pg (ref 27.0–33.0)
MCHC: 34.7 g/dL (ref 32.0–36.0)
MCV: 87.4 fL (ref 80.0–100.0)
MONOS PCT: 8.8 %
MPV: 10.2 fL (ref 7.5–12.5)
NEUTROS PCT: 55 %
Neutro Abs: 3905 cells/uL (ref 1500–7800)
PLATELETS: 212 10*3/uL (ref 140–400)
RBC: 5.54 10*6/uL (ref 4.20–5.80)
RDW: 13.3 % (ref 11.0–15.0)
TOTAL LYMPHOCYTE: 31 %
WBC: 7.1 10*3/uL (ref 3.8–10.8)
WBCMIX: 625 {cells}/uL (ref 200–950)

## 2018-04-27 LAB — SEDIMENTATION RATE: Sed Rate: 2 mm/h (ref 0–20)

## 2018-05-08 ENCOUNTER — Other Ambulatory Visit: Payer: Self-pay | Admitting: Endocrinology

## 2018-05-08 MED FILL — VICTOZA 18 MG/3 ML INJECT P: 18 | 30 days supply | Qty: 9 | Fill #1

## 2018-05-08 MED FILL — LEVEMIR FLEXTOUCH 100 UNITS: 100 | 25 days supply | Qty: 3 | Fill #7

## 2018-05-08 MED FILL — INVOKANA 300 MG TABLET: 300 | 30 days supply | Qty: 30 | Fill #0

## 2018-05-13 ENCOUNTER — Encounter: Payer: Self-pay | Admitting: Family Medicine

## 2018-05-13 ENCOUNTER — Ambulatory Visit: Payer: 59 | Admitting: Family Medicine

## 2018-05-13 VITALS — BP 122/82 | HR 94 | Temp 98.1°F | Resp 18 | Ht 72.0 in | Wt 188.0 lb

## 2018-05-13 DIAGNOSIS — E1149 Type 2 diabetes mellitus with other diabetic neurological complication: Secondary | ICD-10-CM

## 2018-05-13 DIAGNOSIS — I2510 Atherosclerotic heart disease of native coronary artery without angina pectoris: Secondary | ICD-10-CM | POA: Diagnosis not present

## 2018-05-13 DIAGNOSIS — I10 Essential (primary) hypertension: Secondary | ICD-10-CM

## 2018-05-13 DIAGNOSIS — M5431 Sciatica, right side: Secondary | ICD-10-CM | POA: Diagnosis not present

## 2018-05-13 DIAGNOSIS — N200 Calculus of kidney: Secondary | ICD-10-CM | POA: Diagnosis not present

## 2018-05-13 DIAGNOSIS — Z23 Encounter for immunization: Secondary | ICD-10-CM

## 2018-05-13 DIAGNOSIS — M62838 Other muscle spasm: Secondary | ICD-10-CM

## 2018-05-13 DIAGNOSIS — E782 Mixed hyperlipidemia: Secondary | ICD-10-CM | POA: Diagnosis not present

## 2018-05-13 MED ORDER — TIZANIDINE HCL 4 MG PO TABS: ORAL_TABLET | ORAL | 2 refills | Status: DC

## 2018-05-13 MED ORDER — INSULIN DETEMIR 100 UNIT/ML FLEXPEN: 24.0000 [IU] | PEN_INJECTOR | Freq: Every day | SUBCUTANEOUS | 1 refills | Status: DC

## 2018-05-13 MED ORDER — AMITRIPTYLINE HCL 10 MG PO TABS: 10.0000 mg | ORAL_TABLET | Freq: Every day | ORAL | 3 refills | Status: DC

## 2018-05-13 MED FILL — AMITRIPTYLINE HCL 10 MG TAB: 10 | 30 days supply | Qty: 60 | Fill #0

## 2018-05-13 MED FILL — tiZANidine HCL 4 MG TABS: 4 | 15 days supply | Qty: 30 | Fill #0

## 2018-05-13 NOTE — Progress Notes (Signed)
Subjective:    [name], male    DOB: 1955/10/29, 62 y.o.   MRN: [id number]  No chief complaint on file.   HPI Patient is in today for follow up and is accompanied by his daughter who is here visiting from [place]. He feels most well today although he continues to note bilateral flank pain. It is improving but not gone. No hematuria or dysuria. No fevers or chills. He has been drinking sodas which have been putting his sugars up. He denies polyuria or polydipsia. He notes no sugars below 100. No recent febrile illness o hospitalizations. Denies CP/palp/SOB/HA/congestion/fevers/GI or c/o. Taking meds as prescribed  Past Medical History:  Diagnosis Date  . Colon polyps    adenomatous  . Diverticulosis 10/26/2013  . DM (diabetes mellitus), type 2 with neurological complications ([place]) 04/06/2352  . 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 History of chicken pox    childhood  . History of kidney stones   . Hyperlipidemia 07/26/2014  . Hypertension   . Insomnia 02/25/2013  . Preventative health care 07/26/2014  . Renal lithiasis 02/21/2015  . Sciatica of right side 12/22/2012  . Seasonal allergies   . 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 Trigger finger 05/03/2017    Past Surgical History:  Procedure Laterality Date  . ABDOMINAL HERNIA REPAIR    . CHOLECYSTECTOMY    . HAND SURGERY    . HERNIA REPAIR    . LAPAROSCOPIC APPENDECTOMY N/A 08/16/2014   Procedure: APPENDECTOMY LAPAROSCOPIC;  Surgeon: Erroll Luna, MD;  Location: [facility];  Service: General;  Laterality: N/A;  . MASS EXCISION  05/22/2012   Procedure: EXCISION MASS;  Surgeon: Joyice Faster. Cornett, MD;  Location: [facility];  Service: General;  Laterality: Left;  excision abdominal wall mass  . ROTATOR CUFF REPAIR    . SHOULDER ARTHROSCOPY    . TRIGGER FINGER RELEASE Left 01/15/2018   Procedure: LEFT TRIGGER FINGER A-1 PULLEY RELEASE;  Surgeon:  [name], MD;  Location: [facility];  Service: Orthopedics;  Laterality: Left;    Family History  Problem Relation Age of Onset  . Lung cancer Mother   . Diabetes Maternal Aunt   . Cancer Paternal Grandmother   . Breast cancer Neg Hx   . Colon cancer Neg Hx   . Prostate cancer Neg Hx   . Heart disease Neg Hx     Social History   Socioeconomic History  . Marital status: Married    Spouse name: Not on file  . Number of children: 3  . Years of education: Not on file  . Highest education level: Not on file  Occupational History    Comment: Animal nutritionist [employer]  Social Needs  . Financial resource strain: Not on file  . Food insecurity:    Worry: Not on file    Inability: Not on file  . Transportation needs:    Medical: Not on file    Non-medical: Not on file  Tobacco Use  . Smoking status: Never Smoker  . Smokeless tobacco: Never Used  Substance and Sexual Activity  . Alcohol use: No  . Drug use: No  . Sexual activity: Yes  Lifestyle  . Physical activity:    Days per week: Not on file    Minutes per session: Not on file  . Stress: Not on file  Relationships  . Social connections:    Talks  on phone: Not on file    Gets together: Not on file    Attends religious service: Not on file    Active member of club or organization: Not on file    Attends meetings of clubs or organizations: Not on file    Relationship status: Not on file  . Intimate partner violence:    Fear of current or ex partner: Not on file    Emotionally abused: Not on file    Physically abused: Not on file    Forced sexual activity: Not on file  Other Topics Concern  . Not on file  Social History Narrative  . Not on file    Outpatient Medications Prior to Visit  Medication Sig Dispense Refill  . ACCU-CHEK GUIDE test strip USE TO TEST BLOOD SUGAR DAILY AT VARIABLE TIMES 50 each 4  . aspirin EC 81 MG tablet Take 1 tablet (81 mg total) by mouth daily.    . fluticasone  (FLONASE) 50 MCG/ACT nasal spray Place 2 sprays into both nostrils daily. (Patient taking differently: Place 2 sprays into both nostrils daily as needed. ) 16 g 1  . Insulin Pen Needle 31G X 8 MM MISC Use to inject medication 3 times per day. 300 each 2  . INVOKANA 300 MG TABS tablet TAKE 1 TABLET (300 MG TOTAL) BY MOUTH DAILY BEFORE BREAKFAST. 30 tablet 0  . loperamide (IMODIUM A-D) 2 MG tablet Take 2 mg by mouth 4 (four) times daily as needed for diarrhea or loose stools.    . MULTIPLE VITAMIN PO Take 1 packet by mouth daily.    . sodium chloride (OCEAN) 0.65 % SOLN nasal spray Place 1 spray into both nostrils as needed for congestion.    . Turmeric Curcumin 500 MG CAPS Take 1 capsule by mouth daily.    Marland Kitchen VICTOZA 18 MG/3ML SOPN INJECT 0.3 MLS (1.8 MG TOTAL) INTO THE SKIN DAILY. 9 mL 3  . amitriptyline (ELAVIL) 10 MG tablet Take 1-2 tablets (10-20 mg total) by mouth at bedtime. 60 tablet 3  . ciprofloxacin (CIPRO) 500 MG tablet Take 1 tablet (500 mg total) by mouth 2 (two) times daily. 14 tablet 0  . HYDROcodone-acetaminophen (NORCO) 5-325 MG tablet 1 tab po q 6 hours prn pain 30 tablet 0  . Insulin Detemir (LEVEMIR FLEXTOUCH) 100 UNIT/ML Pen Inject 16 Units into the skin daily at 10 pm. 15 mL 1  . metroNIDAZOLE (FLAGYL) 500 MG tablet Take 1 tablet (500 mg total) by mouth 3 (three) times daily. 21 tablet 0  . tiZANidine (ZANAFLEX) 4 MG tablet TAKE 1/2-1 TABLET BY MOUTH 2 TIMES DAILY AS NEEDED FOR MUSCLE SPASMS. 30 tablet 2   No facility-administered medications prior to visit.     Allergies  Allergen Reactions  . Contrast Media [Iodinated Diagnostic Agents] Swelling    Can take if premedicated with diphenhydramine  . Iodine Swelling  . Metformin And Related Diarrhea    Review of Systems  Constitutional: Negative for fever and malaise/fatigue.  HENT: Negative for congestion.   Eyes: Negative for blurred vision.  Respiratory: Negative for shortness of breath.   Cardiovascular: Negative  for chest pain, palpitations and leg swelling.  Gastrointestinal: Negative for abdominal pain, blood in stool and nausea.  Genitourinary: Positive for flank pain. Negative for dysuria and frequency.  Musculoskeletal: Positive for back pain. Negative for falls.  Skin: Negative for rash.  Neurological: Negative for dizziness, loss of consciousness and headaches.  Endo/Heme/Allergies: Negative for environmental allergies.  Psychiatric/Behavioral:  Negative for depression. The patient is not nervous/anxious.        Objective:    Physical Exam  Constitutional: He is oriented to person, place, and time. He appears well-developed and well-nourished. No distress.  HENT:  Head: Normocephalic and atraumatic.  Nose: Nose normal.  Eyes: Right eye exhibits no discharge. Left eye exhibits no discharge.  Neck: Normal range of motion. Neck supple.  Cardiovascular: Normal rate and regular rhythm.  No murmur heard. Pulmonary/Chest: Effort normal and breath sounds normal.  Abdominal: Soft. Bowel sounds are normal. There is no tenderness.  Musculoskeletal: He exhibits no edema.  Neurological: He is alert and oriented to person, place, and time.  Skin: Skin is warm and dry.  Psychiatric: He has a normal mood and affect.  Nursing note and vitals reviewed.   BP 122/82 (BP Location: Left Arm, Patient Position: Sitting, Cuff Size: Normal)   Pulse 94   Temp 98.1 F (36.7 C) (Oral)   Resp 18   Ht 6' (1.829 m)   Wt 188 lb (85.3 kg)   SpO2 97%   BMI 25.50 kg/m  Wt Readings from Last 3 Encounters:  05/13/18 188 lb (85.3 kg)  04/24/18 189 lb (85.7 kg)  02/13/18 191 lb 8 oz (86.9 kg)     Lab Results  Component Value Date   WBC 7.1 04/26/2018   HGB 16.8 04/26/2018   HCT 48.4 04/26/2018   PLT 212 04/26/2018   GLUCOSE 187 (H) 04/26/2018   CHOL 115 04/27/2017   TRIG 91.0 04/27/2017   HDL 45.70 04/27/2017   LDLDIRECT 109.0 10/26/2014   LDLCALC 52 04/27/2017   ALT 23 04/26/2018   AST 20  04/26/2018   NA 133 (L) 04/26/2018   K 4.2 04/26/2018   CL 98 04/26/2018   CREATININE 0.97 04/26/2018   BUN 13 04/26/2018   CO2 24 04/26/2018   TSH 1.88 10/30/2017   PSA 1.07 04/24/2018   INR 1.10 08/16/2014   HGBA1C 9.5 (H) 04/24/2018   MICROALBUR <0.7 08/22/2017    Lab Results  Component Value Date   TSH 1.88 10/30/2017   Lab Results  Component Value Date   WBC 7.1 04/26/2018   HGB 16.8 04/26/2018   HCT 48.4 04/26/2018   MCV 87.4 04/26/2018   PLT 212 04/26/2018   Lab Results  Component Value Date   NA 133 (L) 04/26/2018   K 4.2 04/26/2018   CO2 24 04/26/2018   GLUCOSE 187 (H) 04/26/2018   BUN 13 04/26/2018   CREATININE 0.97 04/26/2018   BILITOT 0.6 04/26/2018   ALKPHOS 85 04/24/2018   AST 20 04/26/2018   ALT 23 04/26/2018   PROT 7.3 04/26/2018   ALBUMIN 4.4 04/24/2018   CALCIUM 10.0 04/26/2018   ANIONGAP 7 01/11/2018   GFR 89.21 04/24/2018   Lab Results  Component Value Date   CHOL 115 04/27/2017   Lab Results  Component Value Date   HDL 45.70 04/27/2017   Lab Results  Component Value Date   LDLCALC 52 04/27/2017   Lab Results  Component Value Date   TRIG 91.0 04/27/2017   Lab Results  Component Value Date   CHOLHDL 3 04/27/2017   Lab Results  Component Value Date   HGBA1C 9.5 (H) 04/24/2018       Assessment & Plan:   Problem List Items Addressed This Visit    DM (diabetes mellitus), type 2 with neurological complications ([place])    Discussed need to stop all sodas which he drinks several of  weekly. hgba1c unacceptable, minimize simple carbs. Increase exercise as tolerated. Continue current meds and increase levemir dosing to 24 units and can increase to 30 units as directed and he agrees to call his endocrinologist for further instructions.      Relevant Medications   Insulin Detemir (LEVEMIR FLEXTOUCH) 100 UNIT/ML Pen   Other Relevant Orders   Ambulatory referral to Cardiology   Hemoglobin A1c   HTN (hypertension) - Primary    Well  controlled, no changes to meds. Encouraged heart healthy diet such as the DASH diet and exercise as tolerated.       Relevant Orders   Ambulatory referral to Cardiology   CBC   Comprehensive metabolic panel   TSH   Sciatica of right side   Relevant Medications   amitriptyline (ELAVIL) 10 MG tablet   tiZANidine (ZANAFLEX) 4 MG tablet   Hyperlipidemia, mixed    Tolerating statin, encouraged heart healthy diet, avoid trans fats, minimize simple carbs and saturated fats. Increase exercise as tolerated      Relevant Orders   Ambulatory referral to Cardiology   Lipid panel   Kidney stone    Patient with some improving but persistent flank pain referred to urology for further consideration, hydrate and report worsening pain      Relevant Orders   Ambulatory referral to Urology   Muscle spasm   Relevant Medications   amitriptyline (ELAVIL) 10 MG tablet   tiZANidine (ZANAFLEX) 4 MG tablet   CAD (coronary artery disease)    Referred to cardiology due to risk factors for futher consideration      Relevant Orders   Ambulatory referral to Cardiology      I have discontinued Reginia Forts. Rita's HYDROcodone-acetaminophen, ciprofloxacin, and metroNIDAZOLE. I have also changed his Insulin Detemir. Additionally, I am having him maintain his loperamide, aspirin EC, sodium chloride, Turmeric Curcumin, MULTIPLE VITAMIN PO, ACCU-CHEK GUIDE, Insulin Pen Needle, fluticasone, VICTOZA, INVOKANA, amitriptyline, and tiZANidine.  Meds ordered this encounter  Medications  . Insulin Detemir (LEVEMIR FLEXTOUCH) 100 UNIT/ML Pen    Sig: Inject 24-30 Units into the skin daily at 10 pm.    Dispense:  20 mL    Refill:  1  . amitriptyline (ELAVIL) 10 MG tablet    Sig: Take 1-2 tablets (10-20 mg total) by mouth at bedtime.    Dispense:  60 tablet    Refill:  3  . tiZANidine (ZANAFLEX) 4 MG tablet    Sig: TAKE 1/2-1 TABLET BY MOUTH 2 TIMES DAILY AS NEEDED FOR MUSCLE SPASMS.    Dispense:  30 tablet     Refill:  2     [name], MD

## 2018-05-13 NOTE — Patient Instructions (Addendum)
CoverMyMeds GoodRx  Levemir can increase by 2 units every 3 days as long as no sugars below 200, call Dr Dwyane Dee for next appt Carbohydrate Counting for Diabetes Mellitus, Adult Carbohydrate counting is a method for keeping track of how many carbohydrates you eat. Eating carbohydrates naturally increases the amount of sugar (glucose) in the blood. Counting how many carbohydrates you eat helps keep your blood glucose within normal limits, which helps you manage your diabetes (diabetes mellitus). It is important to know how many carbohydrates you can safely have in each meal. This is different for every person. A diet and nutrition specialist (registered dietitian) can help you make a meal plan and calculate how many carbohydrates you should have at each meal and snack. Carbohydrates are found in the following foods:  Grains, such as breads and cereals.  Dried beans and soy products.  Starchy vegetables, such as potatoes, peas, and corn.  Fruit and fruit juices.  Milk and yogurt.  Sweets and snack foods, such as cake, cookies, candy, chips, and soft drinks.  How do I count carbohydrates? There are two ways to count carbohydrates in food. You can use either of the methods or a combination of both. Reading "Nutrition Facts" on packaged food The "Nutrition Facts" list is included on the labels of almost all packaged foods and beverages in the [place] It includes:  The serving size.  Information about nutrients in each serving, including the grams (g) of carbohydrate per serving.  To use the "Nutrition Facts":  Decide how many servings you will have.  Multiply the number of servings by the number of carbohydrates per serving.  The resulting number is the total amount of carbohydrates that you will be having.  Learning standard serving sizes of other foods When you eat foods containing carbohydrates that are not packaged or do not include "Nutrition Facts" on the label, you need to measure  the servings in order to count the amount of carbohydrates:  Measure the foods that you will eat with a food scale or measuring cup, if needed.  Decide how many standard-size servings you will eat.  Multiply the number of servings by 15. Most carbohydrate-rich foods have about 15 g of carbohydrates per serving. ? For example, if you eat 8 oz (170 g) of strawberries, you will have eaten 2 servings and 30 g of carbohydrates (2 servings x 15 g = 30 g).  For foods that have more than one food mixed, such as soups and casseroles, you must count the carbohydrates in each food that is included.  The following list contains standard serving sizes of common carbohydrate-rich foods. Each of these servings has about 15 g of carbohydrates:   hamburger bun or  English muffin.   oz (15 mL) syrup.   oz (14 g) jelly.  1 slice of bread.  1 six-inch tortilla.  3 oz (85 g) cooked rice or pasta.  4 oz (113 g) cooked dried beans.  4 oz (113 g) starchy vegetable, such as peas, corn, or potatoes.  4 oz (113 g) hot cereal.  4 oz (113 g) mashed potatoes or  of a large baked potato.  4 oz (113 g) canned or frozen fruit.  4 oz (120 mL) fruit juice.  4-6 crackers.  6 chicken nuggets.  6 oz (170 g) unsweetened dry cereal.  6 oz (170 g) plain fat-free yogurt or yogurt sweetened with artificial sweeteners.  8 oz (240 mL) milk.  8 oz (170 g) fresh fruit or one  small piece of fruit.  24 oz (680 g) popped popcorn.  Example of carbohydrate counting Sample meal  3 oz (85 g) chicken breast.  6 oz (170 g) brown rice.  4 oz (113 g) corn.  8 oz (240 mL) milk.  8 oz (170 g) strawberries with sugar-free whipped topping. Carbohydrate calculation 1. Identify the foods that contain carbohydrates: ? Rice. ? Corn. ? Milk. ? Strawberries. 2. Calculate how many servings you have of each food: ? 2 servings rice. ? 1 serving corn. ? 1 serving milk. ? 1 serving strawberries. 3. Multiply  each number of servings by 15 g: ? 2 servings rice x 15 g = 30 g. ? 1 serving corn x 15 g = 15 g. ? 1 serving milk x 15 g = 15 g. ? 1 serving strawberries x 15 g = 15 g. 4. Add together all of the amounts to find the total grams of carbohydrates eaten: ? 30 g + 15 g + 15 g + 15 g = 75 g of carbohydrates total. This information is not intended to replace advice given to you by your health care provider. Make sure you discuss any questions you have with your health care provider. Document Released: 07/31/2005 Document Revised: 02/18/2016 Document Reviewed: 01/12/2016 Elsevier Interactive Patient Education  [employer].

## 2018-05-13 NOTE — Assessment & Plan Note (Signed)
Referred to cardiology due to risk factors for futher consideration

## 2018-05-13 NOTE — Assessment & Plan Note (Signed)
Patient with some improving but persistent flank pain referred to urology for further consideration, hydrate and report worsening pain

## 2018-05-13 NOTE — Assessment & Plan Note (Signed)
Well controlled, no changes to meds. Encouraged heart healthy diet such as the DASH diet and exercise as tolerated.  °

## 2018-05-13 NOTE — Assessment & Plan Note (Signed)
Discussed need to stop all sodas which he drinks several of weekly. hgba1c unacceptable, minimize simple carbs. Increase exercise as tolerated. Continue current meds and increase levemir dosing to 24 units and can increase to 30 units as directed and he agrees to call his endocrinologist for further instructions.

## 2018-05-13 NOTE — Assessment & Plan Note (Signed)
Tolerating statin, encouraged heart healthy diet, avoid trans fats, minimize simple carbs and saturated fats. Increase exercise as tolerated 

## 2018-05-29 MED FILL — tiZANidine HCL 4 MG TABS: 4 | 15 days supply | Qty: 30 | Fill #1

## 2018-06-07 ENCOUNTER — Other Ambulatory Visit: Payer: Self-pay | Admitting: Endocrinology

## 2018-06-07 MED FILL — INVOKANA 300 MG TABLET: 300 | 60 days supply | Qty: 60 | Fill #0

## 2018-06-19 MED FILL — AMITRIPTYLINE HCL 10 MG TAB: 10 | 30 days supply | Qty: 60 | Fill #1

## 2018-06-19 MED FILL — VICTOZA 18 MG/3 ML INJECT P: 18 | 30 days supply | Qty: 9 | Fill #2

## 2018-06-19 MED FILL — LEVEMIR FLEXTOUCH 100 UNITS: 100 | 25 days supply | Qty: 3 | Fill #8

## 2018-06-19 MED FILL — tiZANidine HCL 4 MG TABS: 4 | 15 days supply | Qty: 30 | Fill #2

## 2018-06-27 DIAGNOSIS — R310 Gross hematuria: Secondary | ICD-10-CM | POA: Diagnosis not present

## 2018-06-27 DIAGNOSIS — R3912 Poor urinary stream: Secondary | ICD-10-CM | POA: Diagnosis not present

## 2018-06-27 DIAGNOSIS — N401 Enlarged prostate with lower urinary tract symptoms: Secondary | ICD-10-CM | POA: Diagnosis not present

## 2018-07-03 DIAGNOSIS — R310 Gross hematuria: Secondary | ICD-10-CM | POA: Diagnosis not present

## 2018-07-03 DIAGNOSIS — N400 Enlarged prostate without lower urinary tract symptoms: Secondary | ICD-10-CM | POA: Diagnosis not present

## 2018-07-03 DIAGNOSIS — R160 Hepatomegaly, not elsewhere classified: Secondary | ICD-10-CM | POA: Diagnosis not present

## 2018-07-03 DIAGNOSIS — K760 Fatty (change of) liver, not elsewhere classified: Secondary | ICD-10-CM | POA: Diagnosis not present

## 2018-07-17 ENCOUNTER — Ambulatory Visit: Payer: 59 | Admitting: Family Medicine

## 2018-07-17 ENCOUNTER — Encounter: Payer: Self-pay | Admitting: Family Medicine

## 2018-07-17 VITALS — BP 130/80 | HR 113 | Temp 98.0°F | Ht 72.0 in | Wt 187.2 lb

## 2018-07-17 DIAGNOSIS — R1031 Right lower quadrant pain: Secondary | ICD-10-CM | POA: Diagnosis not present

## 2018-07-17 MED ORDER — MELOXICAM 15 MG PO TABS: 15.0000 mg | ORAL_TABLET | Freq: Every day | ORAL | 0 refills | Status: DC

## 2018-07-17 MED FILL — MELOXICAM 15 MG TABLET: 15 | 30 days supply | Qty: 30 | Fill #0

## 2018-07-17 NOTE — Progress Notes (Signed)
Pre visit review using our clinic review tool, if applicable. No additional management support is needed unless otherwise documented below in the visit note. 

## 2018-07-17 NOTE — Patient Instructions (Signed)
We will be in touch regarding the results of your ultrasound.   OK to take Tylenol 1000 mg (2 extra strength tabs) or 975 mg (3 regular strength tabs) every 6 hours as needed.  Ice/cold pack over area for 10-15 min twice daily.  Heat (pad or rice pillow in microwave) over affected area, 10-15 minutes twice daily.   Let us know if you need anything.

## 2018-07-17 NOTE — Progress Notes (Signed)
Chief Complaint  Patient presents with  . Hernia    Subjective: Patient is a 62 y.o. male here for possible hernia.  2 weeks ago, sneezed and felt pain in R groin region. Has been placing heat over area and taking tylenol. Works [place] at [employer] for work and bearing down is painful. He has not noticed a bulge. No other skin changes over area.    ROS: MSK: +inguinal pain  Past Medical History:  Diagnosis Date  . Colon polyps    adenomatous  . Diverticulosis 10/26/2013  . DM (diabetes mellitus), type 2 with neurological complications ([place]) 0/17/5102  . 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 History of chicken pox    childhood  . History of kidney stones   . Hyperlipidemia 07/26/2014  . Hypertension   . Insomnia 02/25/2013  . Preventative health care 07/26/2014  . Renal lithiasis 02/21/2015  . Sciatica of right side 12/22/2012  . Seasonal allergies   . 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 Trigger finger 05/03/2017    Objective: BP 130/80 (BP Location: Left Arm, Patient Position: Sitting, Cuff Size: Normal)   Pulse (!) 113   Temp 98 F (36.7 C) (Oral)   Ht 6' (1.829 m)   Wt 187 lb 4 oz (84.9 kg)   SpO2 96%   BMI 25.40 kg/m  General: Awake, appears stated age MSK: +[place] on R GU: No ext lesions, no ttp over testicles,  Lungs: No accessory muscle use Psych: Age appropriate judgment and insight, normal affect and mood  Assessment and Plan: Right inguinal pain - Plan: US Abdomen Limited, meloxicam (MOBIC) 15 MG tablet  Ck [place]. Could be hip flexor strain. Rec'd stretching this.  Heat, ice, Tylenol. Fu prn.  The patient voiced understanding and agreement to the plan.  [place], DO 07/17/18  1:24 PM

## 2018-07-18 DIAGNOSIS — R3912 Poor urinary stream: Secondary | ICD-10-CM | POA: Diagnosis not present

## 2018-07-18 DIAGNOSIS — R310 Gross hematuria: Secondary | ICD-10-CM | POA: Diagnosis not present

## 2018-07-18 DIAGNOSIS — N401 Enlarged prostate with lower urinary tract symptoms: Secondary | ICD-10-CM | POA: Diagnosis not present

## 2018-07-19 ENCOUNTER — Ambulatory Visit (HOSPITAL_BASED_OUTPATIENT_CLINIC_OR_DEPARTMENT_OTHER)
Admission: RE | Admit: 2018-07-19 | Discharge: 2018-07-19 | Disposition: A | Discharge status: Home / Self Care | Payer: 59 | Source: Ambulatory Visit | Attending: Family Medicine | Admitting: Family Medicine

## 2018-07-19 ENCOUNTER — Telehealth: Payer: Self-pay

## 2018-07-19 ENCOUNTER — Other Ambulatory Visit (INDEPENDENT_AMBULATORY_CARE_PROVIDER_SITE_OTHER): Payer: 59

## 2018-07-19 DIAGNOSIS — K4091 Unilateral inguinal hernia, without obstruction or gangrene, recurrent: Secondary | ICD-10-CM | POA: Diagnosis not present

## 2018-07-19 DIAGNOSIS — K4090 Unilateral inguinal hernia, without obstruction or gangrene, not specified as recurrent: Secondary | ICD-10-CM | POA: Insufficient documentation

## 2018-07-19 DIAGNOSIS — R1031 Right lower quadrant pain: Secondary | ICD-10-CM

## 2018-07-19 DIAGNOSIS — E1165 Type 2 diabetes mellitus with hyperglycemia: Secondary | ICD-10-CM

## 2018-07-19 DIAGNOSIS — Z794 Long term (current) use of insulin: Secondary | ICD-10-CM

## 2018-07-19 LAB — COMPREHENSIVE METABOLIC PANEL
ALT: 39 U/L (ref 0–53)
AST: 22 U/L (ref 0–37)
Albumin: 4.2 g/dL (ref 3.5–5.2)
Alkaline Phosphatase: 135 U/L — ABNORMAL HIGH (ref 39–117)
BUN: 22 mg/dL (ref 6–23)
CALCIUM: 9.9 mg/dL (ref 8.4–10.5)
CHLORIDE: 101 meq/L (ref 96–112)
CO2: 24 meq/L (ref 19–32)
Creatinine, Ser: 1.1 mg/dL (ref 0.40–1.50)
GFR: 72.53 mL/min (ref >60.00)
Glucose, Bld: 347 mg/dL — ABNORMAL HIGH (ref 70–99)
POTASSIUM: 4.6 meq/L (ref 3.5–5.1)
Sodium: 134 mEq/L — ABNORMAL LOW (ref 135–145)
Total Bilirubin: 0.3 mg/dL (ref 0.2–1.2)
Total Protein: 7.7 g/dL (ref 6.0–8.3)

## 2018-07-19 LAB — HEMOGLOBIN A1C: Hgb A1c MFr Bld: 11.1 % — ABNORMAL HIGH (ref 4.6–6.5)

## 2018-07-19 LAB — LIPID PANEL
CHOLESTEROL: 256 mg/dL — AB (ref 0–200)
HDL: 36.2 mg/dL — AB (ref >39.00)
Total CHOL/HDL Ratio: 7

## 2018-07-19 LAB — LDL CHOLESTEROL, DIRECT: LDL DIRECT: 146 mg/dL

## 2018-07-19 NOTE — Telephone Encounter (Signed)
Imaging calling to get verbal OK to change US abdomen order to US pelvis order in order to get best view of R inguinal area. Approval given, and Imaging stated she would place order and route to Dr. Nani Ravens to approve.

## 2018-07-22 NOTE — Progress Notes (Signed)
[name], male   DOB: 06-05-1956, 62 y.o.   MRN: [id number]           Reason for Appointment:  Follow-up for Type 2 Diabetes   History of Present Illness:          Date of diagnosis of type 2 diabetes mellitus: 2010 ?       Background history:   He thinks his blood sugars at the time of diagnosis was around 500 and he was symptomatic with increased thirst and urination He has been taking metformin for several years but also has been tried on Amaryl Since about 2013 he has been on Januvia In early 2016 his blood sugars were significantly higher with A1c 11.9 and he was started on Lantus insulin, initially 10 units Janumet was continued Prior to consultation his A1c had gone up to 9%  Recent history:   Non-insulin hypoglycemic drugs the patient is taking are:  Invokana 300 mg daily  Victoza 1.8 mg daily, glipizide ER 2.5 mg daily   INSULIN regimen is: Levemir 24 units daily  His A1c was 7.3 in January and now is markedly higher at 11.1  Current management, blood sugar patterns and problems identified:  he has not been seen for several months and did not call to make a follow-up appointment despite his blood sugars being out of control  He was having significantly high readings this summer also but on his own he has been increasing his LEVEMIR, previously on 12 units  Again he has been having various health and family issues and not consistently exercising more planning his meals well  Although his weight is still about the same and he does not feel tired is blood sugars have been over 300 as seen on the lab  Not clear how often he is checking his sugars, usually does not do readings in the mornings  Blood sugars are the highest after dinner  He is also concerned about the higher co-pay for Levemir  He continues to be regular with his Victoza and Invokana but he does not like to take a daily injection  He is still walking fairly constantly during his work  hours otherwise no formal exercise  Usually trying to have a low carbohydrate meal at lunchtime, sometimes a wrap        Side effects from medications have been: Diarrhea from high dose metformin  Compliance with the medical regimen: Inconsistent  Glucose monitoring:  done 1-2 times a day         Glucometer:  Accu-Chek Blood Glucose readings by recall   PRE-MEAL Fasting Lunch Dinner Bedtime Overall  Glucose range: 140-180      Mean/median:        POST-MEAL PC Breakfast PC Lunch PC Dinner  Glucose range: 220  270  Mean/median:        Self-care: The diet that the patient has been following is: tries to limit high-fat foods, carbohydrates .     Meal times are:  Breakfast is at 6 AM, Lunch: 12-1 PM Dinner: 6-7 PM    Typical meal intake: Breakfast is usually cereal or oatmeal on weekdays at the cafeteria, usually taking lunch from home to work               Dietician visit, most recent: 11/2015               Exercise:   walking at work   Weight history:   Wt Readings from  Last 3 Encounters:  07/23/18 193 lb 12.8 oz (87.9 kg)  07/17/18 187 lb 4 oz (84.9 kg)  05/13/18 188 lb (85.3 kg)    Glycemic control:   Lab Results  Component Value Date   HGBA1C 11.1 (H) 07/19/2018   HGBA1C 9.5 (H) 04/24/2018   HGBA1C 7.3 (H) 08/22/2017   Lab Results  Component Value Date   MICROALBUR <0.7 08/22/2017   LDLCALC 52 04/27/2017   CREATININE 1.10 07/19/2018    Lab on 07/19/2018  Component Date Value Ref Range Status  . Cholesterol 07/19/2018 256* 0 - 200 mg/dL Final   ATP III Classification       Desirable:  < 200 mg/dL               Borderline High:  200 - 239 mg/dL          High:  > = 240 mg/dL  . Triglycerides 07/19/2018 662.0 Triglyceride is over 400; calculations on Lipids are invalid.* 0.0 - 149.0 mg/dL Final   Normal:  <150 mg/dLBorderline High:  150 - 199 mg/dL  . HDL 07/19/2018 36.20* >39.00 mg/dL Final  . Total CHOL/HDL Ratio 07/19/2018 7   Final                  Men           Women1/2 Average Risk     3.4          3.3Average Risk          5.0          4.42X Average Risk          9.6          7.13X Average Risk          15.0          11.0                      . Sodium 07/19/2018 134* 135 - 145 mEq/L Final  . Potassium 07/19/2018 4.6  3.5 - 5.1 mEq/L Final  . Chloride 07/19/2018 101  96 - 112 mEq/L Final  . CO2 07/19/2018 24  19 - 32 mEq/L Final  . Glucose, Bld 07/19/2018 347* 70 - 99 mg/dL Final  . BUN 07/19/2018 22  6 - 23 mg/dL Final  . Creatinine, Ser 07/19/2018 1.10  0.40 - 1.50 mg/dL Final  . Total Bilirubin 07/19/2018 0.3  0.2 - 1.2 mg/dL Final  . Alkaline Phosphatase 07/19/2018 135* 39 - 117 U/L Final  . AST 07/19/2018 22  0 - 37 U/L Final  . ALT 07/19/2018 39  0 - 53 U/L Final  . Total Protein 07/19/2018 7.7  6.0 - 8.3 g/dL Final  . Albumin 07/19/2018 4.2  3.5 - 5.2 g/dL Final  . Calcium 07/19/2018 9.9  8.4 - 10.5 mg/dL Final  . GFR 07/19/2018 72.53  >60.00 mL/min Final  . Hgb A1c MFr Bld 07/19/2018 11.1* 4.6 - 6.5 % Final   Glycemic Control Guidelines for People with Diabetes:Non Diabetic:  <6%Goal of Therapy: <7%Additional Action Suggested:  >8%   . Direct LDL 07/19/2018 146.0  mg/dL Final   Optimal:  <100 mg/dLNear or Above Optimal:  100-129 mg/dLBorderline High:  130-159 mg/dLHigh:  160-189 mg/dLVery High:  >190 mg/dL       Allergies as of 07/23/2018      Reactions   Contrast Media [iodinated Diagnostic Agents] Swelling   Can take if premedicated with diphenhydramine   Iodine Swelling  Metformin And Related Diarrhea      Medication List        Accurate as of 07/23/18 11:43 AM. Always use your most recent med list.          ACCU-CHEK GUIDE test strip Generic drug:  glucose blood USE TO TEST BLOOD SUGAR DAILY AT VARIABLE TIMES   aspirin EC 81 MG tablet Take 1 tablet (81 mg total) by mouth daily.   canagliflozin 300 MG Tabs tablet Commonly known as:  INVOKANA Take 1 tablet (300 mg total) by mouth daily before breakfast.  MUST KEEP UPCOMING APPT   fluticasone 50 MCG/ACT nasal spray Commonly known as:  FLONASE Place 2 sprays into both nostrils daily.   FREESTYLE LIBRE 14 DAY READER Devi Inject 1 each into the skin every 14 (fourteen) days. Use reader with freestyle sensor to monitor blood sugar daily.   FREESTYLE LIBRE 14 DAY SENSOR Misc Inject 1 each into the skin every 14 (fourteen) days. Apply to back of arm every 14 days to monitor blood sugar.   insulin degludec 100 UNIT/ML Sopn FlexTouch Pen Commonly known as:  TRESIBA Inject 0.28 mLs (28 Units total) into the skin daily. Inject 28 units under the skin daily.   insulin lispro 100 UNIT/ML KwikPen Commonly known as:  HUMALOG Inject 0.06-0.1 mLs (6-10 Units total) into the skin 3 (three) times daily. Inject 6-10 units under skin skin 3 times daily before meals.   Insulin Pen Needle 31G X 8 MM Misc Use to inject medication 3 times per day.   loperamide 2 MG tablet Commonly known as:  IMODIUM A-D Take 2 mg by mouth 4 (four) times daily as needed for diarrhea or loose stools.   meloxicam 15 MG tablet Commonly known as:  MOBIC Take 1 tablet (15 mg total) by mouth daily.   MULTIPLE VITAMIN PO Take 1 packet by mouth daily.   sodium chloride 0.65 % Soln nasal spray Commonly known as:  OCEAN Place 1 spray into both nostrils as needed for congestion.   Turmeric Curcumin 500 MG Caps Take 1 capsule by mouth daily.   VICTOZA 18 MG/3ML Sopn Generic drug:  liraglutide INJECT 0.3 MLS (1.8 MG TOTAL) INTO THE SKIN DAILY.       Allergies:  Allergies  Allergen Reactions  . Contrast Media [Iodinated Diagnostic Agents] Swelling    Can take if premedicated with diphenhydramine  . Iodine Swelling  . Metformin And Related Diarrhea    Past Medical History:  Diagnosis Date  . Colon polyps    adenomatous  . Diverticulosis 10/26/2013  . DM (diabetes mellitus), type 2 with neurological complications ([place]) 1/93/7902  . Hereditary and idiopathic  peripheral neuropathy 07/26/2014  . History of chicken pox    childhood  . History of kidney stones   . Hyperlipidemia 07/26/2014  . Hypertension   . Insomnia 02/25/2013  . Preventative health care 07/26/2014  . Renal lithiasis 02/21/2015  . Sciatica of right side 12/22/2012  . Seasonal allergies   . 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 Trigger finger 05/03/2017    Past Surgical History:  Procedure Laterality Date  . ABDOMINAL HERNIA REPAIR    . CHOLECYSTECTOMY    . HAND SURGERY    . HERNIA REPAIR    . LAPAROSCOPIC APPENDECTOMY N/A 08/16/2014   Procedure: APPENDECTOMY LAPAROSCOPIC;  Surgeon: Erroll Luna, MD;  Location: [facility];  Service: General;  Laterality: N/A;  . MASS EXCISION  05/22/2012  Procedure: EXCISION MASS;  Surgeon: Joyice Faster. Cornett, MD;  Location: [facility];  Service: General;  Laterality: Left;  excision abdominal wall mass  . ROTATOR CUFF REPAIR    . SHOULDER ARTHROSCOPY    . TRIGGER FINGER RELEASE Left 01/15/2018   Procedure: LEFT TRIGGER FINGER A-1 PULLEY RELEASE;  Surgeon: [name], MD;  Location: [facility];  Service: Orthopedics;  Laterality: Left;    Family History  Problem Relation Age of Onset  . Lung cancer Mother   . Diabetes Maternal Aunt   . Cancer Paternal Grandmother   . Breast cancer Neg Hx   . Colon cancer Neg Hx   . Prostate cancer Neg Hx   . Heart disease Neg Hx     Social History:  reports that he has never smoked. He has never used smokeless tobacco. He reports that he does not drink alcohol or use drugs.    Review of Systems    Lipid history: Had been on long-term treatment with statin, was on 40 mg Lipitor, Followed by PCP   Lab Results  Component Value Date   CHOL 256 (H) 07/19/2018   HDL 36.20 (L) 07/19/2018   LDLCALC 52 04/27/2017   LDLDIRECT 146.0 07/19/2018   TRIG (H) 07/19/2018    662.0 Triglyceride is over 400; calculations on Lipids are invalid.    CHOLHDL 7 07/19/2018           Hypertension: Blood pressure appears well controlled   BP Readings from Last 3 Encounters:  07/23/18 130/82  07/17/18 130/80  05/13/18 122/82        Review of Systems    LABS:  Lab on 07/19/2018  Component Date Value Ref Range Status  . Cholesterol 07/19/2018 256* 0 - 200 mg/dL Final   ATP III Classification       Desirable:  < 200 mg/dL               Borderline High:  200 - 239 mg/dL          High:  > = 240 mg/dL  . Triglycerides 07/19/2018 662.0 Triglyceride is over 400; calculations on Lipids are invalid.* 0.0 - 149.0 mg/dL Final   Normal:  <150 mg/dLBorderline High:  150 - 199 mg/dL  . HDL 07/19/2018 36.20* >39.00 mg/dL Final  . Total CHOL/HDL Ratio 07/19/2018 7   Final                  Men          Women1/2 Average Risk     3.4          3.3Average Risk          5.0          4.42X Average Risk          9.6          7.13X Average Risk          15.0          11.0                      . Sodium 07/19/2018 134* 135 - 145 mEq/L Final  . Potassium 07/19/2018 4.6  3.5 - 5.1 mEq/L Final  . Chloride 07/19/2018 101  96 - 112 mEq/L Final  . CO2 07/19/2018 24  19 - 32 mEq/L Final  . Glucose, Bld 07/19/2018 347* 70 - 99 mg/dL Final  . BUN 07/19/2018 22  6 -  23 mg/dL Final  . Creatinine, Ser 07/19/2018 1.10  0.40 - 1.50 mg/dL Final  . Total Bilirubin 07/19/2018 0.3  0.2 - 1.2 mg/dL Final  . Alkaline Phosphatase 07/19/2018 135* 39 - 117 U/L Final  . AST 07/19/2018 22  0 - 37 U/L Final  . ALT 07/19/2018 39  0 - 53 U/L Final  . Total Protein 07/19/2018 7.7  6.0 - 8.3 g/dL Final  . Albumin 07/19/2018 4.2  3.5 - 5.2 g/dL Final  . Calcium 07/19/2018 9.9  8.4 - 10.5 mg/dL Final  . GFR 07/19/2018 72.53  >60.00 mL/min Final  . Hgb A1c MFr Bld 07/19/2018 11.1* 4.6 - 6.5 % Final   Glycemic Control Guidelines for People with Diabetes:Non Diabetic:  <6%Goal of Therapy: <7%Additional Action Suggested:  >8%   . Direct LDL 07/19/2018 146.0  mg/dL Final    Optimal:  <100 mg/dLNear or Above Optimal:  100-129 mg/dLBorderline High:  130-159 mg/dLHigh:  160-189 mg/dLVery High:  >190 mg/dL    Physical Examination:  BP 130/82 (BP Location: Left Arm, Patient Position: Sitting, Cuff Size: Normal)   Pulse 95   Ht 6' (1.829 m)   Wt 193 lb 12.8 oz (87.9 kg)   SpO2 98%   BMI 26.28 kg/m     ASSESSMENT:  Diabetes type 2,  with normal  BMI See history of present illness for detailed discussion of current diabetes management, blood sugar patterns and problems identified  His A1c has jumped up to 11.1%  However he has not followed up as directed and only increase his basal insulin which has not controlled his blood sugar He is also taking Victoza and Invokana without adequate control of postprandial readings Does not appear to be very symptomatic at present However explained to him that he has progressive insulin deficiency specially with his being normal weight and duration of diabetes and need to advance his treatment to next level He is not wanting to take multiple injections including the daily Victoza but explained the differences between basal and bolus insulin  LIPIDS: Poorly controlled and this is partly related to hyperglycemia Needs to discuss restarting his Lipitor with PCP  PLAN:   Today discussed in detail the need for mealtime insulin to cover postprandial spikes, action of mealtime insulin, use of the insulin pen, timing and action of the rapid acting insulin as well as starting dose and dosage titration to target the two-hour reading of under 180 For now he does need to start with 6 units Humalog and this will be progressively increased based on his blood sugar levels with target of at least under 180 before the next meal Most likely he may need to keep the dose at lunchtime small because of his increased activity level and lower carbohydrate intake at that time.  Also can skip the noon dose if he is eating a low-carb meals such as a  salad  To enable titration he needs to check his sugars 4 times a day now We will also try to see if he has coverage for the freestyle libre system  He will be instructed in more detail by the nurse educator  Also given instructions on changing his Levemir to [facility] starting with 28 units and titrating every 3 days as discussed to Help keep his morning sugars below 130 at least  Also when he finishes his Victoza he can switch to Trulicity for once a week convenience  Counseling time on subjects discussed in assessment and plan sections is over 50%  of today's 25 minute visit   Patient Instructions  Need to start checking sugar 4 times a day for now  Levemir will be replaced by Tyler Aas insulin:  TRESIBA insulin: This insulin provides blood sugar control for up to 24 hours.   Start with 28 units at bedtime daily and increase by 2 units every 3 days until the waking up sugars are under 130. Then continue the same dose.  If blood sugar is under 90 for 2 days in a row, reduce the dose by 2 units.   Note that this insulin does not control the rise of blood sugar with meals     HUMALOG insulin: Start taking 6 units before each meal and blood sugar at the next meal should be at least under 180 as explained on-year-old instruction sheet       Elayne Snare 07/23/2018, 11:43 AM   Note: This office note was prepared with Dragon voice recognition system technology. Any transcriptional errors that result from this process are unintentional.

## 2018-07-23 ENCOUNTER — Encounter: Payer: Self-pay | Admitting: Family Medicine

## 2018-07-23 ENCOUNTER — Encounter: Payer: Self-pay | Admitting: Endocrinology

## 2018-07-23 ENCOUNTER — Other Ambulatory Visit: Payer: Self-pay

## 2018-07-23 ENCOUNTER — Other Ambulatory Visit: Payer: Self-pay | Admitting: Endocrinology

## 2018-07-23 ENCOUNTER — Ambulatory Visit (INDEPENDENT_AMBULATORY_CARE_PROVIDER_SITE_OTHER): Payer: 59 | Admitting: Endocrinology

## 2018-07-23 VITALS — BP 130/82 | HR 95 | Ht 72.0 in | Wt 193.8 lb

## 2018-07-23 DIAGNOSIS — E1165 Type 2 diabetes mellitus with hyperglycemia: Secondary | ICD-10-CM | POA: Diagnosis not present

## 2018-07-23 DIAGNOSIS — Z794 Long term (current) use of insulin: Secondary | ICD-10-CM

## 2018-07-23 DIAGNOSIS — E782 Mixed hyperlipidemia: Secondary | ICD-10-CM

## 2018-07-23 MED ORDER — INSULIN DEGLUDEC 100 UNIT/ML ~~LOC~~ SOPN: 28.0000 [IU] | PEN_INJECTOR | Freq: Every day | SUBCUTANEOUS | 3 refills | Status: DC

## 2018-07-23 MED ORDER — FREESTYLE LIBRE 14 DAY SENSOR MISC: 1.0000 | 3 refills | Status: DC

## 2018-07-23 MED ORDER — FREESTYLE LIBRE 14 DAY READER DEVI: 1.0000 | 0 refills | Status: DC

## 2018-07-23 MED ORDER — INSULIN LISPRO (1 UNIT DIAL) 100 UNIT/ML (KWIKPEN): 6.0000 [IU] | PEN_INJECTOR | Freq: Three times a day (TID) | SUBCUTANEOUS | 3 refills | Status: DC

## 2018-07-23 MED FILL — HUMALOG 100 UNITS/ML KWIKPE: 100 | 29 days supply | Qty: 9 | Fill #0

## 2018-07-23 MED FILL — PENTIPS 31G X 5 MM MISC: 31G X 5 MM | 90 days supply | Qty: 300 | Fill #0

## 2018-07-23 MED FILL — TRESIBA FLEXTOUCH 100 UNITS: 100 | 32 days supply | Qty: 9 | Fill #0

## 2018-07-23 NOTE — Patient Instructions (Signed)
Need to start checking sugar 4 times a day for now  Levemir will be replaced by Tyler Aas insulin:  TRESIBA insulin: This insulin provides blood sugar control for up to 24 hours.   Start with 28 units at bedtime daily and increase by 2 units every 3 days until the waking up sugars are under 130. Then continue the same dose.  If blood sugar is under 90 for 2 days in a row, reduce the dose by 2 units.   Note that this insulin does not control the rise of blood sugar with meals     HUMALOG insulin: Start taking 6 units before each meal and blood sugar at the next meal should be at least under 180 as explained on-year-old instruction sheet

## 2018-07-24 MED FILL — ACCU-CHEK FASTCLIX LANCETS: 30 days supply | Qty: 102 | Fill #0

## 2018-07-24 MED FILL — ACCU-CHEK GUIDE TEST STRIP: 25 days supply | Qty: 50 | Fill #0

## 2018-07-26 ENCOUNTER — Telehealth: Payer: Self-pay

## 2018-07-26 NOTE — Telephone Encounter (Signed)
PA for [facility] meter and sensors done via CoverMyMeds. Key for meter:[id number] Key for sensor:[id number]

## 2018-07-30 ENCOUNTER — Telehealth: Payer: Self-pay | Admitting: Endocrinology

## 2018-07-30 NOTE — Telephone Encounter (Signed)
Received fax from [place] stating that patient was approved for [employer] 14 day reader. Authorization is good from 07/23/18 through 07/23/19, as long as the pt is enrolled in this health plan.

## 2018-07-30 NOTE — Telephone Encounter (Signed)
Called pharmacy and stated that that the reader was approved for coverage, but the sensors were denied. Will call insurance company.

## 2018-07-30 NOTE — Telephone Encounter (Signed)
Pharmacy called in regards to prior auth for Continuous Blood Gluc Sensor (FREESTYLE LIBRE 14 DAY SENSOR) MISC please advise, thanks

## 2018-07-30 NOTE — Telephone Encounter (Signed)
PA resubmitted via covermymeds for freestyle libre 14 day sensor. KEY: [id number]

## 2018-08-01 ENCOUNTER — Telehealth: Payer: Self-pay | Admitting: Endocrinology

## 2018-08-01 NOTE — Telephone Encounter (Signed)
Patient stated that a freestyle libra was sent into the pharmacy for him He did not know about Dr Dwyane Dee sending this in for him  He said he did not want this nor could he afford it.

## 2018-08-01 NOTE — Telephone Encounter (Signed)
Please be advised.  °

## 2018-08-01 NOTE — Telephone Encounter (Signed)
We had briefly talked about this as being useful to monitor his blood sugar more closely because of his poor control.  However if he cannot afford it he needs to make sure he checks his blood sugars at least twice a day at different times of the day

## 2018-08-02 NOTE — Telephone Encounter (Signed)
Attempted to call pt and got no answer.

## 2018-08-06 ENCOUNTER — Encounter: Payer: 59 | Attending: Endocrinology | Admitting: Nutrition

## 2018-08-06 ENCOUNTER — Other Ambulatory Visit: Payer: Self-pay

## 2018-08-06 DIAGNOSIS — E1149 Type 2 diabetes mellitus with other diabetic neurological complication: Secondary | ICD-10-CM | POA: Insufficient documentation

## 2018-08-06 MED ORDER — CANAGLIFLOZIN 300 MG PO TABS: ORAL_TABLET | ORAL | 3 refills | Status: AC

## 2018-08-06 MED FILL — INVOKANA 300 MG TABLET: 300 | 90 days supply | Qty: 90 | Fill #0

## 2018-08-06 NOTE — Patient Instructions (Signed)
decrease Tresiba from 31 to 29, Decrease Humalog before supper  from 7 to 5.

## 2018-08-06 NOTE — Progress Notes (Signed)
Pt. Reports that he is taking Tresiba:31u q HS, and Humalog 7u acmeals.   FBSs: 72, 73, 104, 106, 107, 96 (This is after a bedtime snack with no insulin),  AcL: 90-126, acS: 98-107, and one 186, HS: 71-84  Patient gets shakey at lower 80s.  Eating between 45-60 carbs at HS with no insulin. Plan: decrease Tresiba from 31 to 29, and Humalog acS from 7 to 5.

## 2018-08-08 ENCOUNTER — Telehealth: Payer: Self-pay

## 2018-08-08 MED FILL — VICTOZA 18 MG/3 ML INJECT P: 18 | 30 days supply | Qty: 9 | Fill #3

## 2018-08-08 NOTE — Telephone Encounter (Signed)
PA submitted via [email or web address] for Invokana 300mg  tablets.  Key: [id number]

## 2018-08-13 ENCOUNTER — Other Ambulatory Visit: Payer: Self-pay | Admitting: Endocrinology

## 2018-08-13 DIAGNOSIS — Z794 Long term (current) use of insulin: Principal | ICD-10-CM

## 2018-08-13 DIAGNOSIS — E1165 Type 2 diabetes mellitus with hyperglycemia: Secondary | ICD-10-CM

## 2018-08-15 MED FILL — ACCU-CHEK GUIDE TEST STRIP: 13 days supply | Qty: 50 | Fill #1

## 2018-08-16 ENCOUNTER — Other Ambulatory Visit (INDEPENDENT_AMBULATORY_CARE_PROVIDER_SITE_OTHER): Payer: No Typology Code available for payment source

## 2018-08-16 ENCOUNTER — Telehealth: Payer: Self-pay | Admitting: *Deleted

## 2018-08-16 DIAGNOSIS — Z794 Long term (current) use of insulin: Secondary | ICD-10-CM | POA: Diagnosis not present

## 2018-08-16 DIAGNOSIS — E1165 Type 2 diabetes mellitus with hyperglycemia: Secondary | ICD-10-CM | POA: Diagnosis not present

## 2018-08-16 LAB — BASIC METABOLIC PANEL
BUN: 16 mg/dL (ref 6–23)
CO2: 24 mEq/L (ref 19–32)
Calcium: 10 mg/dL (ref 8.4–10.5)
Chloride: 101 mEq/L (ref 96–112)
Creatinine, Ser: 0.88 mg/dL (ref 0.40–1.50)
GFR: 93.8 mL/min (ref >60.00)
Glucose, Bld: 99 mg/dL (ref 70–99)
Potassium: 4.6 mEq/L (ref 3.5–5.1)
SODIUM: 135 meq/L (ref 135–145)

## 2018-08-16 LAB — MICROALBUMIN / CREATININE URINE RATIO
CREATININE, U: 79 mg/dL
Microalb Creat Ratio: 0.9 mg/g (ref 0.0–30.0)
Microalb, Ur: <0.7 mg/dL (ref 0.0–1.9)

## 2018-08-16 NOTE — Telephone Encounter (Signed)
Yes - thank you

## 2018-08-16 NOTE — Telephone Encounter (Signed)
Not able to cancel duplicate orders for lab appt on Monday. Will cancel them at lab appt and only do CBC and TSH.

## 2018-08-16 NOTE — Telephone Encounter (Signed)
Dr Charlett Blake -- pt is scheduled for a lab appt with [place] on Monday. He was seen by Dr Dwyane Dee 07/19/18 and today as well. All future orders from [place] have been included in recent labs at endo except the TSH and CBC. Is it ok to cancel all other future labs (CMP, lipid panel, hemoglobin a1c)

## 2018-08-17 LAB — FRUCTOSAMINE: Fructosamine: 331 umol/L — ABNORMAL HIGH (ref 0–285)

## 2018-08-19 ENCOUNTER — Other Ambulatory Visit (INDEPENDENT_AMBULATORY_CARE_PROVIDER_SITE_OTHER): Payer: No Typology Code available for payment source

## 2018-08-19 DIAGNOSIS — E1149 Type 2 diabetes mellitus with other diabetic neurological complication: Secondary | ICD-10-CM | POA: Diagnosis not present

## 2018-08-19 DIAGNOSIS — I10 Essential (primary) hypertension: Secondary | ICD-10-CM | POA: Diagnosis not present

## 2018-08-19 DIAGNOSIS — E782 Mixed hyperlipidemia: Secondary | ICD-10-CM

## 2018-08-19 LAB — CBC
HCT: 47.7 % (ref 39.0–52.0)
Hemoglobin: 16.3 g/dL (ref 13.0–17.0)
MCHC: 34.1 g/dL (ref 30.0–36.0)
MCV: 90.7 fl (ref 78.0–100.0)
Platelets: 244 10*3/uL (ref 150.0–400.0)
RBC: 5.27 Mil/uL (ref 4.22–5.81)
RDW: 13.8 % (ref 11.5–15.5)
WBC: 4.7 10*3/uL (ref 4.0–10.5)

## 2018-08-19 LAB — TSH: TSH: 1.4 u[IU]/mL (ref 0.35–4.50)

## 2018-08-19 MED FILL — HUMALOG 100 UNITS/ML KWIKPE: 100 | 29 days supply | Qty: 9 | Fill #1

## 2018-08-20 ENCOUNTER — Ambulatory Visit (INDEPENDENT_AMBULATORY_CARE_PROVIDER_SITE_OTHER): Payer: No Typology Code available for payment source | Admitting: Endocrinology

## 2018-08-20 ENCOUNTER — Encounter: Payer: Self-pay | Admitting: Endocrinology

## 2018-08-20 ENCOUNTER — Other Ambulatory Visit: Payer: Self-pay

## 2018-08-20 VITALS — BP 130/80 | HR 103 | Ht 72.0 in | Wt 190.2 lb

## 2018-08-20 DIAGNOSIS — E782 Mixed hyperlipidemia: Secondary | ICD-10-CM | POA: Diagnosis not present

## 2018-08-20 DIAGNOSIS — Z794 Long term (current) use of insulin: Secondary | ICD-10-CM | POA: Diagnosis not present

## 2018-08-20 DIAGNOSIS — E1165 Type 2 diabetes mellitus with hyperglycemia: Secondary | ICD-10-CM

## 2018-08-20 MED ORDER — LIRAGLUTIDE 18 MG/3ML ~~LOC~~ SOPN: 1.8000 mg | PEN_INJECTOR | Freq: Every day | SUBCUTANEOUS | 3 refills | Status: DC

## 2018-08-20 MED ORDER — INSULIN PEN NEEDLE 32G X 4 MM MISC: 1.0000 | Freq: Every day | 11 refills | Status: AC

## 2018-08-20 MED ORDER — ATORVASTATIN CALCIUM 20 MG PO TABS: 20.0000 mg | ORAL_TABLET | Freq: Every day | ORAL | 3 refills | Status: AC

## 2018-08-20 MED FILL — ATORVASTATIN CALCIUM 20 MG: 20 | 90 days supply | Qty: 90 | Fill #0

## 2018-08-20 NOTE — Progress Notes (Signed)
Patient ID: REG BIRCHER, male   DOB: [birth year], 63 y.o.   MRN: [id number]           Reason for Appointment:  Follow-up for Type 2 Diabetes   History of Present Illness:          Date of diagnosis of type 2 diabetes mellitus: 2010 ?       Background history:   He thinks his blood sugars at the time of diagnosis was around 500 and he was symptomatic with increased thirst and urination He has been taking metformin for several years but also has been tried on Amaryl Since about 2013 he has been on Januvia In early 2016 his blood sugars were significantly higher with A1c 11.9 and he was started on Lantus insulin, initially 10 units Janumet was continued Prior to consultation his A1c had gone up to 9%  Recent history:   Non-insulin hypoglycemic drugs the patient is taking are:  Invokana 300 mg daily  Victoza 1.8 mg daily  INSULIN regimen is: [place] 26 units daily, Humalog 5 to 7 units before meals  His A1c was last 11.1 Fructosamine is now 331  Current management, blood sugar patterns and problems identified:  Because of his poor diabetes control in the last few months he has been started on basal bolus insulin regimen  He was switched from Levemir to [place]  Also started on mealtime insulin with Humalog after discussion with diabetes educator  Although he was recommended freestyle libre sensor he cannot afford the co-pay  He does take Victoza and Invokana also  Recently has had some variable weight readings  Although he may not have been consistent with diet last month he is generally trying to watch his meals and plan better along with his wife for better control  He thinks he may go up on his [place] if he is eating a larger meal but mostly taking 26 units  However does not appear to be adjusting his mealtime dose appropriately since with a very low carbohydrate meal last night his blood sugar was 76 and he was symptomatic with low sugar symptoms  No low blood sugars  documented except a 68 reading in the morning  FASTING readings are overall fairly consistent and averaging just over 100 recently  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     Meal times are:  Breakfast is at 6 AM, Lunch: 12-1 PM Dinner: 6-7 PM    Typical meal intake: Breakfast is usually cereal or oatmeal on weekdays at the cafeteria, usually taking lunch from home to work               Dietician visit, most recent: 11/2015               Exercise:   walking at work   Weight history:   Wt Readings from Last 3 Encounters:  08/20/18  190 lb 3.2 oz (86.3 kg)  07/23/18 193 lb 12.8 oz (87.9 kg)  07/17/18 187 lb 4 oz (84.9 kg)    Glycemic control:   Lab Results  Component Value Date   HGBA1C 11.1 (H) 07/19/2018   HGBA1C 9.5 (H) 04/24/2018   HGBA1C 7.3 (H) 08/22/2017   Lab Results  Component Value Date   MICROALBUR <0.7 08/16/2018   LDLCALC 52 04/27/2017   CREATININE 0.88 08/16/2018    Lab on 08/19/2018  Component Date Value Ref Range Status  . WBC 08/19/2018 4.7  4.0 - 10.5 K/uL Final  . RBC 08/19/2018 5.27  4.22 - 5.81 Mil/uL Final  . Platelets 08/19/2018 244.0  150.0 - 400.0 K/uL Final  . Hemoglobin 08/19/2018 16.3  13.0 - 17.0 g/dL Final  . HCT 08/19/2018 47.7  39.0 - 52.0 % Final  . MCV 08/19/2018 90.7  78.0 - 100.0 fl Final  . MCHC 08/19/2018 34.1  30.0 - 36.0 g/dL Final  . RDW 08/19/2018 13.8  11.5 - 15.5 % Final  . TSH 08/19/2018 1.40  0.35 - 4.50 uIU/mL Final  Lab on 08/16/2018    Component Date Value Ref Range Status  . Microalb, Ur 08/16/2018 <0.7  0.0 - 1.9 mg/dL Final  . Creatinine,U 08/16/2018 79.0  mg/dL Final  . Microalb Creat Ratio 08/16/2018 0.9  0.0 - 30.0 mg/g Final  . Fructosamine 08/16/2018 331* 0 - 285 umol/L Final   Comment: Published reference interval for apparently healthy subjects between age 40 and 18 is 65 - 285 umol/L and in a poorly controlled diabetic population is 228 - 563 umol/L with a mean of 396 umol/L.   Marland Kitchen Sodium 08/16/2018 135  135 - 145 mEq/L Final  . Potassium 08/16/2018 4.6  3.5 - 5.1 mEq/L Final  . Chloride 08/16/2018 101  96 - 112 mEq/L Final  . CO2 08/16/2018 24  19 - 32 mEq/L Final  . Glucose, Bld 08/16/2018 99  70 - 99 mg/dL Final  . BUN 08/16/2018 16  6 - 23 mg/dL Final  . Creatinine, Ser 08/16/2018 0.88  0.40 - 1.50 mg/dL Final  . Calcium 08/16/2018 10.0  8.4 - 10.5 mg/dL Final  . GFR 08/16/2018 93.80  >60.00 mL/min Final       Allergies as of 08/20/2018      Reactions   Contrast Media [iodinated Diagnostic Agents] Swelling   Can take if premedicated with diphenhydramine   Iodine Swelling   Metformin And Related Diarrhea      Medication List       Accurate as of August 20, 2018  3:45 PM. Always use your most recent med list.        ACCU-CHEK FASTCLIX LANCETS Misc USE AS DIRECTED TO CHECK BLOOD SUGAR   ACCU-CHEK GUIDE test strip Generic drug:  glucose blood USE TO TEST BLOOD SUGAR DAILY AT VARIABLE TIMES   aspirin EC 81 MG tablet Take 1 tablet (81 mg total) by mouth daily.   atorvastatin 20 MG tablet Commonly known as:  LIPITOR Take 1 tablet (20 mg total) by mouth daily.   canagliflozin 300 MG Tabs tablet Commonly known as:  INVOKANA TAKE 1 TABLET BY MOUTH ONCE DAILY.   fluticasone 50 MCG/ACT nasal spray Commonly known as:  FLONASE Place 2 sprays into both nostrils daily.   insulin degludec 100 UNIT/ML Sopn FlexTouch Pen Commonly known as:  TRESIBA FLEXTOUCH Inject 0.28 mLs (28 Units total)  into the skin daily. Inject 28 units under the skin daily.  insulin lispro 100 UNIT/ML KwikPen Commonly known as:  HUMALOG KWIKPEN Inject 0.06-0.1 mLs (6-10 Units total) into the skin 3 (three) times daily. Inject 6-10 units under skin skin 3 times daily before meals.   Insulin Pen Needle 32G X 4 MM Misc Commonly known as:  BD PEN NEEDLE NANO U/F 1 each by Does not apply route daily. USE PEN NEEDLES TO INJECT INSULIN 5 TIMES DAILY. DX:E11.65   liraglutide 18 MG/3ML Sopn Commonly known as:  VICTOZA Inject 0.3 mLs (1.8 mg total) into the skin daily.   loperamide 2 MG tablet Commonly known as:  IMODIUM A-D Take 2 mg by mouth 4 (four) times daily as needed for diarrhea or loose stools.   meloxicam 15 MG tablet Commonly known as:  MOBIC Take 1 tablet (15 mg total) by mouth daily.   MULTIPLE VITAMIN PO Take 1 packet by mouth daily.   sodium chloride 0.65 % Soln nasal spray Commonly known as:  OCEAN Place 1 spray into both nostrils as needed for congestion.   Turmeric Curcumin 500 MG Caps Take 1 capsule by mouth daily.       Allergies:  Allergies  Allergen Reactions  . Contrast Media [Iodinated Diagnostic Agents] Swelling    Can take if premedicated with diphenhydramine  . Iodine Swelling  . Metformin And Related Diarrhea    Past Medical History:  Diagnosis Date  . Colon polyps    adenomatous  . Diverticulosis 10/26/2013  . DM (diabetes mellitus), type 2 with neurological complications ([place]) 1/60/1093  . 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 History of chicken pox    childhood  . History of kidney stones   . Hyperlipidemia 07/26/2014  . Hypertension   . Insomnia 02/25/2013  . Preventative health care 07/26/2014  . Renal lithiasis 02/21/2015  . Sciatica of right side 12/22/2012  . Seasonal allergies   . 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 Trigger finger 05/03/2017    Past Surgical History:  Procedure  Laterality Date  . ABDOMINAL HERNIA REPAIR    . CHOLECYSTECTOMY    . HAND SURGERY    . HERNIA REPAIR    . LAPAROSCOPIC APPENDECTOMY N/A 08/16/2014   Procedure: APPENDECTOMY LAPAROSCOPIC;  Surgeon: Erroll Luna, MD;  Location: [place] [facility];  Service: General;  Laterality: N/A;  . MASS EXCISION  05/22/2012   Procedure: EXCISION MASS;  Surgeon: Joyice Faster. Cornett, MD;  Location: [place];  Service: General;  Laterality: Left;  excision abdominal wall mass  . ROTATOR CUFF REPAIR    . SHOULDER ARTHROSCOPY    . TRIGGER FINGER RELEASE Left 01/15/2018   Procedure: LEFT TRIGGER FINGER A-1 PULLEY RELEASE;  Surgeon: [name], MD;  Location: [place];  Service: Orthopedics;  Laterality: Left;    Family History  Problem Relation Age of Onset  . Lung cancer Mother   . Diabetes Maternal Aunt   . Cancer Paternal Grandmother   . Breast cancer Neg Hx   . Colon cancer Neg Hx   . Prostate cancer Neg Hx   . Heart disease Neg Hx     Social History:  reports that he has never smoked. He has never used smokeless tobacco. He reports that he does not drink alcohol or use drugs.    Review of Systems    Lipid history: Had been on long-term treatment with statin, was on 40 mg Lipitor He says he stopped taking this because his  cholesterol had come down to normal previously Currently not on any treatment   Lab Results  Component Value Date   CHOL 256 (H) 07/19/2018   HDL 36.20 (L) 07/19/2018   LDLCALC 52 04/27/2017   LDLDIRECT 146.0 07/19/2018   TRIG (H) 07/19/2018    662.0 Triglyceride is over 400; calculations on Lipids are invalid.   CHOLHDL 7 07/19/2018           Hypertension: Blood pressure is recently well controlled   BP Readings from Last 3 Encounters:  08/20/18 130/80  07/23/18 130/82  07/17/18 130/80     Review of Systems    LABS:  Lab on 08/19/2018  Component Date Value Ref Range Status  . WBC 08/19/2018 4.7  4.0 - 10.5 K/uL Final  . RBC  08/19/2018 5.27  4.22 - 5.81 Mil/uL Final  . Platelets 08/19/2018 244.0  150.0 - 400.0 K/uL Final  . Hemoglobin 08/19/2018 16.3  13.0 - 17.0 g/dL Final  . HCT 08/19/2018 47.7  39.0 - 52.0 % Final  . MCV 08/19/2018 90.7  78.0 - 100.0 fl Final  . MCHC 08/19/2018 34.1  30.0 - 36.0 g/dL Final  . RDW 08/19/2018 13.8  11.5 - 15.5 % Final  . TSH 08/19/2018 1.40  0.35 - 4.50 uIU/mL Final  Lab on 08/16/2018  Component Date Value Ref Range Status  . Microalb, Ur 08/16/2018 <0.7  0.0 - 1.9 mg/dL Final  . Creatinine,U 08/16/2018 79.0  mg/dL Final  . Microalb Creat Ratio 08/16/2018 0.9  0.0 - 30.0 mg/g Final  . Fructosamine 08/16/2018 331* 0 - 285 umol/L Final   Comment: Published reference interval for apparently healthy subjects between age 80 and 26 is 75 - 285 umol/L and in a poorly controlled diabetic population is 228 - 563 umol/L with a mean of 396 umol/L.   Marland Kitchen Sodium 08/16/2018 135  135 - 145 mEq/L Final  . Potassium 08/16/2018 4.6  3.5 - 5.1 mEq/L Final  . Chloride 08/16/2018 101  96 - 112 mEq/L Final  . CO2 08/16/2018 24  19 - 32 mEq/L Final  . Glucose, Bld 08/16/2018 99  70 - 99 mg/dL Final  . BUN 08/16/2018 16  6 - 23 mg/dL Final  . Creatinine, Ser 08/16/2018 0.88  0.40 - 1.50 mg/dL Final  . Calcium 08/16/2018 10.0  8.4 - 10.5 mg/dL Final  . GFR 08/16/2018 93.80  >60.00 mL/min Final    Physical Examination:  BP 130/80 (BP Location: Left Arm, Patient Position: Sitting, Cuff Size: Normal)   Pulse (!) 103   Ht 6' (1.829 m)   Wt 190 lb 3.2 oz (86.3 kg)   SpO2 96%   BMI 25.80 kg/m     ASSESSMENT:  Diabetes type 2,  with normal  BMI  See history of present illness for detailed discussion of current diabetes management, blood sugar patterns and problems identified  His A1c was last 11.1%   He appears to have had insulin deficiency developing in 2019 Now with basal bolus insulin regimen along with Victoza and Invokana his blood sugars are excellent Appears to be needing  relatively more basal insulin and fasting readings are fairly consistent with 26 units of Tresiba which appears to be working better than Levemir He does appear to be following instructions for taking mealtime insulin also Overall appears to be improving his diet As discussed above he is not clearly understanding how to adjust his mealtime dose based on amount of carbohydrates  LIPIDS: Poorly controlled including LDL Discussed  benefits of statin drugs long-term and he will start him back on at least 20 mg of Lipitor  PLAN:   Continue basic regimen unchanged Explained to him how for low carbohydrate meals at dinnertime he can try not taking the Humalog However if he has significant amount of carbohydrate he can still take 7 units Humalog To keep the [place] steady at 26 units A1c on the next visit  Patient Instructions  Stay on 26 Tresiba  Check blood sugars on waking up days a week  Also check blood sugars about 2 hours after meals and do this after different meals by rotation  Recommended blood sugar levels on waking up are 90-130 and about 2 hours after meal is 130-160  Please bring your blood sugar monitor to each visit, thank you         Elayne Snare 08/20/2018, 3:45 PM   Note: This office note was prepared with Dragon voice recognition system technology. Any transcriptional errors that result from this process are unintentional.

## 2018-08-20 NOTE — Patient Instructions (Addendum)
Stay on 26 Tresiba  Check blood sugars on waking up days a week  Also check blood sugars about 2 hours after meals and do this after different meals by rotation  Recommended blood sugar levels on waking up are 90-130 and about 2 hours after meal is 130-160  Please bring your blood sugar monitor to each visit, thank you

## 2018-08-26 ENCOUNTER — Encounter: Payer: Self-pay | Admitting: Family Medicine

## 2018-08-26 ENCOUNTER — Ambulatory Visit (INDEPENDENT_AMBULATORY_CARE_PROVIDER_SITE_OTHER): Payer: No Typology Code available for payment source | Admitting: Family Medicine

## 2018-08-26 VITALS — BP 98/62 | HR 80 | Temp 97.6°F | Resp 18 | Ht 72.0 in | Wt 186.6 lb

## 2018-08-26 DIAGNOSIS — I10 Essential (primary) hypertension: Secondary | ICD-10-CM | POA: Diagnosis not present

## 2018-08-26 DIAGNOSIS — E782 Mixed hyperlipidemia: Secondary | ICD-10-CM | POA: Diagnosis not present

## 2018-08-26 DIAGNOSIS — E1149 Type 2 diabetes mellitus with other diabetic neurological complication: Secondary | ICD-10-CM | POA: Diagnosis not present

## 2018-08-26 DIAGNOSIS — Z23 Encounter for immunization: Secondary | ICD-10-CM | POA: Diagnosis not present

## 2018-08-26 DIAGNOSIS — K5900 Constipation, unspecified: Secondary | ICD-10-CM

## 2018-08-26 DIAGNOSIS — G4700 Insomnia, unspecified: Secondary | ICD-10-CM

## 2018-08-26 DIAGNOSIS — K4090 Unilateral inguinal hernia, without obstruction or gangrene, not specified as recurrent: Secondary | ICD-10-CM | POA: Insufficient documentation

## 2018-08-26 MED ORDER — ZOLPIDEM TARTRATE 10 MG PO TABS: 10.0000 mg | ORAL_TABLET | Freq: Every evening | ORAL | 1 refills | Status: DC | PRN

## 2018-08-26 MED FILL — ZOLPIDEM TARTRATE 10 MG TAB: 10 | 15 days supply | Qty: 15 | Fill #0

## 2018-08-26 NOTE — Assessment & Plan Note (Signed)
Encouraged good sleep hygiene such as dark, quiet room. No blue/green glowing lights such as computer screens in bedroom. No alcohol or stimulants in evening. Cut down on caffeine as able. Regular exercise is helpful but not just prior to bed time.  

## 2018-08-26 NOTE — Assessment & Plan Note (Signed)
Encouraged increased hydration and fiber in diet. Daily probiotics. If bowels not moving can use MOM 2 tbls po in 4 oz of warm prune juice by mouth every 2-3 days. If no results then repeat in 4 hours with  Dulcolax suppository pr, may repeat again in 4 more hours as needed. Seek care if symptoms worsen. Consider daily Miralax and/or Dulcolax if symptoms persist.  

## 2018-08-26 NOTE — Assessment & Plan Note (Signed)
Tolerating statin, encouraged heart healthy diet, avoid trans fats, minimize simple carbs and saturated fats. Increase exercise as tolerated 

## 2018-08-26 NOTE — Assessment & Plan Note (Signed)
Patient concerned

## 2018-08-26 NOTE — Assessment & Plan Note (Signed)
hgba1c acceptable, minimize simple carbs. Increase exercise as tolerated. Continue current meds 

## 2018-08-26 NOTE — Progress Notes (Signed)
Subjective:    [name], male    DOB: 1955-09-15, 63 y.o.   MRN: [id number]  No chief complaint on file.   HPI Patient is in today for follow-up.  He reports his biggest concern is increased difficulty sleeping once again.  He has used zolpidem in the past with good results but is been trying not to take it.  Has trouble getting a full night sleep.  Notes his blood sugars are improved as he works with endocrinology although his last hemoglobin A1c was 11.1.  No polyuria or polydipsia.  No recent febrile illness or hospitalizations.  He continues to work as a Presenter, broadcasting at [employer]. Denies CP/palp/SOB/HA/congestion/fevers/GI or GU c/o. Taking meds as prescribed  Past Medical History:  Diagnosis Date  . Colon polyps    adenomatous  . Diverticulosis 10/26/2013  . DM (diabetes mellitus), type 2 with neurological complications ([place]) 01/09/4131  . 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 History of chicken pox    childhood  . History of kidney stones   . Hyperlipidemia 07/26/2014  . Hypertension   . Insomnia 02/25/2013  . Preventative health care 07/26/2014  . Renal lithiasis 02/21/2015  . Sciatica of right side 12/22/2012  . Seasonal allergies   . 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 Trigger finger 05/03/2017    Past Surgical History:  Procedure Laterality Date  . ABDOMINAL HERNIA REPAIR    . CHOLECYSTECTOMY    . HAND SURGERY    . HERNIA REPAIR    . LAPAROSCOPIC APPENDECTOMY N/A 08/16/2014   Procedure: APPENDECTOMY LAPAROSCOPIC;  Surgeon: Erroll Luna, MD;  Location: [place];  Service: General;  Laterality: N/A;  . MASS EXCISION  05/22/2012   Procedure: EXCISION MASS;  Surgeon: Joyice Faster. Cornett, MD;  Location: [place];  Service: General;  Laterality: Left;  excision abdominal wall mass  . ROTATOR CUFF REPAIR    . SHOULDER ARTHROSCOPY    . TRIGGER FINGER RELEASE Left 01/15/2018   Procedure: LEFT TRIGGER FINGER A-1 PULLEY RELEASE;  Surgeon: [name], MD;  Location: [facility];  Service: Orthopedics;  Laterality: Left;    Family History  Problem Relation Age of Onset  . Lung cancer Mother   . Diabetes Maternal Aunt   . Cancer Paternal Grandmother   . Breast cancer Neg Hx   . Colon cancer Neg Hx   . Prostate cancer Neg Hx   . Heart disease Neg Hx     Social History   Socioeconomic History  . Marital status: Married    Spouse name: Not on file  . Number of children: 3  . Years of education: Not on file  . Highest education level: Not on file  Occupational History    Comment: Animal nutritionist [employer]  Social Needs  . Financial resource strain: Not on file  . Food insecurity:    Worry: Not on file    Inability: Not on file  . Transportation needs:    Medical: Not on file    Non-medical: Not on file  Tobacco Use  . Smoking status: Never Smoker  . Smokeless tobacco: Never Used  Substance and Sexual Activity  . Alcohol use: No  . Drug use: No  . Sexual activity: Yes  Lifestyle  . Physical activity:    Days per week: Not on file    Minutes per session: Not on file  . Stress:  Not on file  Relationships  . Social connections:    Talks on phone: Not on file    Gets together: Not on file    Attends religious service: Not on file    Active member of club or organization: Not on file    Attends meetings of clubs or organizations: Not on file    Relationship status: Not on file  . Intimate partner violence:    Fear of current or ex partner: Not on file    Emotionally abused: Not on file    Physically abused: Not on file    Forced sexual activity: Not on file  Other Topics Concern  . Not on file  Social History Narrative  . Not on file    Outpatient Medications Prior to Visit  Medication Sig Dispense Refill  . ACCU-CHEK FASTCLIX LANCETS MISC USE AS DIRECTED TO CHECK BLOOD SUGAR 102 each 4  . ACCU-CHEK GUIDE test strip USE  TO TEST BLOOD SUGAR DAILY AT VARIABLE TIMES 50 each 4  . aspirin EC 81 MG tablet Take 1 tablet (81 mg total) by mouth daily.    Marland Kitchen atorvastatin (LIPITOR) 20 MG tablet Take 1 tablet (20 mg total) by mouth daily. 90 tablet 3  . canagliflozin (INVOKANA) 300 MG TABS tablet TAKE 1 TABLET BY MOUTH ONCE DAILY. 90 tablet 3  . fluticasone (FLONASE) 50 MCG/ACT nasal spray Place 2 sprays into both nostrils daily. (Patient taking differently: Place 2 sprays into both nostrils daily as needed. ) 16 g 1  . insulin degludec (TRESIBA FLEXTOUCH) 100 UNIT/ML SOPN FlexTouch Pen Inject 0.28 mLs (28 Units total) into the skin daily. Inject 28 units under the skin daily. (Patient taking differently: Inject 26 Units into the skin daily. Inject 26 units under the skin daily.) 3 pen 3  . insulin lispro (HUMALOG KWIKPEN) 100 UNIT/ML KwikPen Inject 0.06-0.1 mLs (6-10 Units total) into the skin 3 (three) times daily. Inject 6-10 units under skin skin 3 times daily before meals. (Patient taking differently: Inject 5 Units into the skin 3 (three) times daily. Inject 5 units under skin skin 3 times daily before meals.) 2 pen 3  . Insulin Pen Needle (BD PEN NEEDLE NANO U/F) 32G X 4 MM MISC 1 each by Does not apply route daily. USE PEN NEEDLES TO INJECT INSULIN 5 TIMES DAILY. DX:E11.65 200 each 11  . liraglutide (VICTOZA) 18 MG/3ML SOPN Inject 0.3 mLs (1.8 mg total) into the skin daily. 9 mL 3  . loperamide (IMODIUM A-D) 2 MG tablet Take 2 mg by mouth 4 (four) times daily as needed for diarrhea or loose stools.    . MULTIPLE VITAMIN PO Take 1 packet by mouth daily.    . sodium chloride (OCEAN) 0.65 % SOLN nasal spray Place 1 spray into both nostrils as needed for congestion.    . meloxicam (MOBIC) 15 MG tablet Take 1 tablet (15 mg total) by mouth daily. 30 tablet 0  . Turmeric Curcumin 500 MG CAPS Take 1 capsule by mouth daily.     No facility-administered medications prior to visit.     Allergies  Allergen Reactions  . Contrast  Media [Iodinated Diagnostic Agents] Swelling    Can take if premedicated with diphenhydramine  . Iodine Swelling  . Metformin And Related Diarrhea    Review of Systems  Constitutional: Negative for fever and malaise/fatigue.  HENT: Negative for congestion.   Eyes: Negative for blurred vision.  Respiratory: Negative for shortness of breath.   Cardiovascular: Negative  for chest pain, palpitations and leg swelling.  Gastrointestinal: Positive for abdominal pain and constipation. Negative for blood in stool and nausea.  Genitourinary: Negative for dysuria and frequency.  Musculoskeletal: Negative for falls.  Skin: Negative for rash.  Neurological: Negative for dizziness, loss of consciousness and headaches.  Endo/Heme/Allergies: Negative for environmental allergies.  Psychiatric/Behavioral: Negative for depression. The patient has insomnia. The patient is not nervous/anxious.        Objective:    Physical Exam Vitals signs and nursing note reviewed.  Constitutional:      General: He is not in acute distress.    Appearance: He is well-developed.  HENT:     Head: Normocephalic and atraumatic.     Nose: Nose normal.  Eyes:     General:        Right eye: No discharge.        Left eye: No discharge.  Neck:     Musculoskeletal: Normal range of motion and neck supple.  Cardiovascular:     Rate and Rhythm: Normal rate and regular rhythm.     Heart sounds: No murmur.  Pulmonary:     Effort: Pulmonary effort is normal.     Breath sounds: Normal breath sounds.  Abdominal:     General: Bowel sounds are normal.     Palpations: Abdomen is soft.     Tenderness: There is no abdominal tenderness.  Skin:    General: Skin is warm and dry.  Neurological:     Mental Status: He is alert and oriented to person, place, and time.     BP 98/62 (BP Location: Left Arm, Patient Position: Sitting, Cuff Size: Normal)   Pulse 80   Temp 97.6 F (36.4 C) (Oral)   Resp 18   Ht 6' (1.829 m)   Wt  186 lb 9.6 oz (84.6 kg)   SpO2 98%   BMI 25.31 kg/m  Wt Readings from Last 3 Encounters:  08/26/18 186 lb 9.6 oz (84.6 kg)  08/20/18 190 lb 3.2 oz (86.3 kg)  07/23/18 193 lb 12.8 oz (87.9 kg)     Lab Results  Component Value Date   WBC 4.7 08/19/2018   HGB 16.3 08/19/2018   HCT 47.7 08/19/2018   PLT 244.0 08/19/2018   GLUCOSE 99 08/16/2018   CHOL 256 (H) 07/19/2018   TRIG (H) 07/19/2018    662.0 Triglyceride is over 400; calculations on Lipids are invalid.   HDL 36.20 (L) 07/19/2018   LDLDIRECT 146.0 07/19/2018   LDLCALC 52 04/27/2017   ALT 39 07/19/2018   AST 22 07/19/2018   NA 135 08/16/2018   K 4.6 08/16/2018   CL 101 08/16/2018   CREATININE 0.88 08/16/2018   BUN 16 08/16/2018   CO2 24 08/16/2018   TSH 1.40 08/19/2018   PSA 1.07 04/24/2018   INR 1.10 08/16/2014   HGBA1C 11.1 (H) 07/19/2018   MICROALBUR <0.7 08/16/2018    Lab Results  Component Value Date   TSH 1.40 08/19/2018   Lab Results  Component Value Date   WBC 4.7 08/19/2018   HGB 16.3 08/19/2018   HCT 47.7 08/19/2018   MCV 90.7 08/19/2018   PLT 244.0 08/19/2018   Lab Results  Component Value Date   NA 135 08/16/2018   K 4.6 08/16/2018   CO2 24 08/16/2018   GLUCOSE 99 08/16/2018   BUN 16 08/16/2018   CREATININE 0.88 08/16/2018   BILITOT 0.3 07/19/2018   ALKPHOS 135 (H) 07/19/2018   AST 22 07/19/2018  ALT 39 07/19/2018   PROT 7.7 07/19/2018   ALBUMIN 4.2 07/19/2018   CALCIUM 10.0 08/16/2018   ANIONGAP 7 01/11/2018   GFR 93.80 08/16/2018   Lab Results  Component Value Date   CHOL 256 (H) 07/19/2018   Lab Results  Component Value Date   HDL 36.20 (L) 07/19/2018   Lab Results  Component Value Date   LDLCALC 52 04/27/2017   Lab Results  Component Value Date   TRIG (H) 07/19/2018    662.0 Triglyceride is over 400; calculations on Lipids are invalid.   Lab Results  Component Value Date   CHOLHDL 7 07/19/2018   Lab Results  Component Value Date   HGBA1C 11.1 (H) 07/19/2018        Assessment & Plan:   Problem List Items Addressed This Visit    DM (diabetes mellitus), type 2 with neurological complications ([place])    [id number] acceptable, minimize simple carbs. Increase exercise as tolerated. Continue current meds      HTN (hypertension)    Well controlled, no changes to meds. Encouraged heart healthy diet such as the DASH diet and exercise as tolerated.       Insomnia    Encouraged good sleep hygiene such as dark, quiet room. No blue/green glowing lights such as computer screens in bedroom. No alcohol or stimulants in evening. Cut down on caffeine as able. Regular exercise is helpful but not just prior to bed time.       Hyperlipidemia, mixed    Tolerating statin, encouraged heart healthy diet, avoid trans fats, minimize simple carbs and saturated fats. Increase exercise as tolerated      Right inguinal hernia    Patient concerned      Constipation    Encouraged increased hydration and fiber in diet. Daily probiotics. If bowels not moving can use MOM 2 tbls po in 4 oz of warm prune juice by mouth every 2-3 days. If no results then repeat in 4 hours with  Dulcolax suppository pr, may repeat again in 4 more hours as needed. Seek care if symptoms worsen. Consider daily Miralax and/or Dulcolax if symptoms persist.          I have discontinued Sparrow Sanzo. Welch's Turmeric Curcumin and meloxicam. I am also having him maintain his loperamide, aspirin EC, sodium chloride, MULTIPLE VITAMIN PO, fluticasone, insulin lispro, insulin degludec, ACCU-CHEK GUIDE, ACCU-CHEK FASTCLIX LANCETS, canagliflozin, atorvastatin, liraglutide, and Insulin Pen Needle.  No orders of the defined types were placed in this encounter.    [name], MD

## 2018-08-26 NOTE — Assessment & Plan Note (Signed)
Well controlled, no changes to meds. Encouraged heart healthy diet such as the DASH diet and exercise as tolerated.  °

## 2018-08-26 NOTE — Patient Instructions (Addendum)
CoQ10, Quonol to help with the muscle aches  Encouraged increased hydration and fiber in diet. Daily probiotics. If bowels not moving can use MOM 2 tbls po in 4 oz of warm prune juice by mouth every 2-3 days. If no results then repeat in 4 hours with  Dulcolax suppository pr, may repeat again in 4 more hours as needed. Seek care if symptoms worsen. Consider daily Miralax and/or Dulcolax if symptoms persist.    Carbohydrate Counting for Diabetes Mellitus, Adult  Carbohydrate counting is a method of keeping track of how many carbohydrates you eat. Eating carbohydrates naturally increases the amount of sugar (glucose) in the blood. Counting how many carbohydrates you eat helps keep your blood glucose within normal limits, which helps you manage your diabetes (diabetes mellitus). It is important to know how many carbohydrates you can safely have in each meal. This is different for every person. A diet and nutrition specialist (registered dietitian) can help you make a meal plan and calculate how many carbohydrates you should have at each meal and snack. Carbohydrates are found in the following foods:  Grains, such as breads and cereals.  Dried beans and soy products.  Starchy vegetables, such as potatoes, peas, and corn.  Fruit and fruit juices.  Milk and yogurt.  Sweets and snack foods, such as cake, cookies, candy, chips, and soft drinks. How do I count carbohydrates? There are two ways to count carbohydrates in food. You can use either of the methods or a combination of both. Reading "Nutrition Facts" on packaged food The "Nutrition Facts" list is included on the labels of almost all packaged foods and beverages in the [place] It includes:  The serving size.  Information about nutrients in each serving, including the grams (g) of carbohydrate per serving. To use the "Nutrition Facts":  Decide how many servings you will have.  Multiply the number of servings by the number of  carbohydrates per serving.  The resulting number is the total amount of carbohydrates that you will be having. Learning standard serving sizes of other foods When you eat carbohydrate foods that are not packaged or do not include "Nutrition Facts" on the label, you need to measure the servings in order to count the amount of carbohydrates:  Measure the foods that you will eat with a food scale or measuring cup, if needed.  Decide how many standard-size servings you will eat.  Multiply the number of servings by 15. Most carbohydrate-rich foods have about 15 g of carbohydrates per serving. ? For example, if you eat 8 oz (170 g) of strawberries, you will have eaten 2 servings and 30 g of carbohydrates (2 servings x 15 g = 30 g).  For foods that have more than one food mixed, such as soups and casseroles, you must count the carbohydrates in each food that is included. The following list contains standard serving sizes of common carbohydrate-rich foods. Each of these servings has about 15 g of carbohydrates:   hamburger bun or  English muffin.   oz (15 mL) syrup.   oz (14 g) jelly.  1 slice of bread.  1 six-inch tortilla.  3 oz (85 g) cooked rice or pasta.  4 oz (113 g) cooked dried beans.  4 oz (113 g) starchy vegetable, such as peas, corn, or potatoes.  4 oz (113 g) hot cereal.  4 oz (113 g) mashed potatoes or  of a large baked potato.  4 oz (113 g) canned or frozen fruit.  4  oz (120 mL) fruit juice.  4-6 crackers.  6 chicken nuggets.  6 oz (170 g) unsweetened dry cereal.  6 oz (170 g) plain fat-free yogurt or yogurt sweetened with artificial sweeteners.  8 oz (240 mL) milk.  8 oz (170 g) fresh fruit or one small piece of fruit.  24 oz (680 g) popped popcorn. Example of carbohydrate counting Sample meal  3 oz (85 g) chicken breast.  6 oz (170 g) brown rice.  4 oz (113 g) corn.  8 oz (240 mL) milk.  8 oz (170 g) strawberries with sugar-free whipped  topping. Carbohydrate calculation 1. Identify the foods that contain carbohydrates: ? Rice. ? Corn. ? Milk. ? Strawberries. 2. Calculate how many servings you have of each food: ? 2 servings rice. ? 1 serving corn. ? 1 serving milk. ? 1 serving strawberries. 3. Multiply each number of servings by 15 g: ? 2 servings rice x 15 g = 30 g. ? 1 serving corn x 15 g = 15 g. ? 1 serving milk x 15 g = 15 g. ? 1 serving strawberries x 15 g = 15 g. 4. Add together all of the amounts to find the total grams of carbohydrates eaten: ? 30 g + 15 g + 15 g + 15 g = 75 g of carbohydrates total. Summary  Carbohydrate counting is a method of keeping track of how many carbohydrates you eat.  Eating carbohydrates naturally increases the amount of sugar (glucose) in the blood.  Counting how many carbohydrates you eat helps keep your blood glucose within normal limits, which helps you manage your diabetes.  A diet and nutrition specialist (registered dietitian) can help you make a meal plan and calculate how many carbohydrates you should have at each meal and snack. This information is not intended to replace advice given to you by your health care provider. Make sure you discuss any questions you have with your health care provider. Document Released: 07/31/2005 Document Revised: 02/07/2017 Document Reviewed: 01/12/2016 Elsevier Interactive Patient Education  2019 [employer].

## 2018-08-28 MED FILL — TRESIBA FLEXTOUCH 100 UNITS: 100 | 32 days supply | Qty: 9 | Fill #1

## 2018-08-29 ENCOUNTER — Encounter: Payer: Self-pay | Admitting: Family Medicine

## 2018-08-29 ENCOUNTER — Other Ambulatory Visit: Payer: Self-pay

## 2018-08-29 MED ORDER — ACCU-CHEK FASTCLIX LANCETS MISC: 12 refills | Status: AC

## 2018-08-29 MED ORDER — GLUCOSE BLOOD VI STRP: ORAL_STRIP | 12 refills | Status: AC

## 2018-08-29 MED FILL — ACCU-CHEK FASTCLIX LANCETS: 33 days supply | Qty: 204 | Fill #0

## 2018-08-29 MED FILL — ACCU-CHEK GUIDE TEST STRIP: 33 days supply | Qty: 200 | Fill #0

## 2018-09-06 ENCOUNTER — Ambulatory Visit: Payer: Self-pay | Admitting: Surgery

## 2018-09-06 NOTE — H&P (Signed)
Surgical H&P  CC: right groin pain  HPI: This is a very pleasant 63 year old man who presents with right groin pain.  This began several months ago after a coughing fit and has been unrelenting ever since.  He has felt a mass lesion there but it has gone down.  In attempts to avoid surgery, he has been trying ice and hernia belt but continues to have pain.  This is associated with constipation somewhat relieved by procedures.  He denies any bladder issues or difficulty urinating.  No appetite loss or abdominal distention.  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   He has had a CT scan in September 2019 notable for atherosclerosis and coronary artery calcification, small umbilical hernia containing fat.  He also had an ultrasound of the pelvis in December last year which does confirm a small fat-containing right inguinal hernia with vasalva  Surgical history notable for open right inguinal hernia repair in the 80s, prior laparoscopic ventral hernia repair which was done in [place] many years ago ( it sounds like he had mesh placed under a diastasis); laparoscopic cholecystectomy, excision of the left upper abdominal wall lipoma, and endoscopic appendectomy which was done by Dr. Brantley Stage in 2013 with multiple adhesions noted intraoperatively-  "Curvilinear incision was made above the umbilicus.  Dissection was carried down, I was able to open the fascia.  There was mesh encountered and I made a small hole in the mesh since this was quite a large piece by the appearance of the scars in his abdomen.  I was able to get to the mesh safely without injuring the bowel.  Pursestring suture 0 Vicryl was placed and a 12 mm Hasson cannula was placed under direct vision. Pneumoperitoneum revealed adhesions of the omentum to the anterior abdominal wall, the transverse colon was pulled up on that.  We were well below this.  There was a single pelvic adhesion noted to the sigmoid colon.   The appendix and cecum were in the right upper quadrant. I had to place 3 additional 5 mm ports, 1 in the right lower quadrant, second in the left lower quadrant, and a third in the left mid abdomen to get proper exposure and be able to see well enough and have room given the adhesions in his abdomen to remove his appendix."  Allergies  Allergen Reactions  . Contrast Media [Iodinated Diagnostic Agents] Swelling    Can take if premedicated with diphenhydramine  . Iodine Swelling  . Metformin And Related Diarrhea    Past Medical History:  Diagnosis Date  . Colon polyps    adenomatous  . Diverticulosis 10/26/2013  . DM (diabetes mellitus), type 2 with neurological complications ([place]) 4/74/2595  . 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 History of chicken pox    childhood  . History of kidney stones   . Hyperlipidemia 07/26/2014  . Hypertension   . Insomnia 02/25/2013  . Preventative health care 07/26/2014  . Renal lithiasis 02/21/2015  . Sciatica of right side 12/22/2012  . Seasonal allergies   . 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 Trigger finger 05/03/2017    Past Surgical History:  Procedure Laterality Date  . ABDOMINAL HERNIA REPAIR    . CHOLECYSTECTOMY    . HAND SURGERY    . HERNIA REPAIR    . LAPAROSCOPIC APPENDECTOMY N/A 08/16/2014   Procedure: APPENDECTOMY LAPAROSCOPIC;  Surgeon: Erroll Luna, MD;  Location: [facility];  Service: General;  Laterality: N/A;  . MASS EXCISION  05/22/2012   Procedure: EXCISION MASS;  Surgeon: Joyice Faster. Cornett, MD;  Location: [facility];  Service: General;  Laterality: Left;  excision abdominal wall mass  . ROTATOR CUFF REPAIR    . SHOULDER ARTHROSCOPY    . TRIGGER FINGER RELEASE Left 01/15/2018   Procedure: LEFT TRIGGER FINGER A-1 PULLEY RELEASE;  Surgeon: [name], MD;  Location: [facility];  Service: Orthopedics;  Laterality: Left;    Family  History  Problem Relation Age of Onset  . Lung cancer Mother   . Diabetes Maternal Aunt   . Cancer Paternal Grandmother   . Breast cancer Neg Hx   . Colon cancer Neg Hx   . Prostate cancer Neg Hx   . Heart disease Neg Hx     Social History   Socioeconomic History  . Marital status: Married    Spouse name: Not on file  . Number of children: 3  . Years of education: Not on file  . Highest education level: Not on file  Occupational History    Comment: Animal nutritionist [employer]  Social Needs  . Financial resource strain: Not on file  . Food insecurity:    Worry: Not on file    Inability: Not on file  . Transportation needs:    Medical: Not on file    Non-medical: Not on file  Tobacco Use  . Smoking status: Never Smoker  . Smokeless tobacco: Never Used  Substance and Sexual Activity  . Alcohol use: No  . Drug use: No  . Sexual activity: Yes  Lifestyle  . Physical activity:    Days per week: Not on file    Minutes per session: Not on file  . Stress: Not on file  Relationships  . Social connections:    Talks on phone: Not on file    Gets together: Not on file    Attends religious service: Not on file    Active member of club or organization: Not on file    Attends meetings of clubs or organizations: Not on file    Relationship status: Not on file  Other Topics Concern  . Not on file  Social History Narrative  . Not on file    Current Outpatient Medications on File Prior to Visit  Medication Sig Dispense Refill  . ACCU-CHEK FASTCLIX LANCETS MISC Use lancets to check blood sugar 6 times daily. 200 each 12  . aspirin EC 81 MG tablet Take 1 tablet (81 mg total) by mouth daily.    Marland Kitchen atorvastatin (LIPITOR) 20 MG tablet Take 1 tablet (20 mg total) by mouth daily. 90 tablet 3  . canagliflozin (INVOKANA) 300 MG TABS tablet TAKE 1 TABLET BY MOUTH ONCE DAILY. 90 tablet 3  . fluticasone (FLONASE) 50 MCG/ACT nasal spray Place 2 sprays into both nostrils daily. (Patient taking  differently: Place 2 sprays into both nostrils daily as needed. ) 16 g 1  . glucose blood (ACCU-CHEK GUIDE) test strip Use as instructed to check blood sugar 6 times daily. 200 each 12  . insulin degludec (TRESIBA FLEXTOUCH) 100 UNIT/ML SOPN FlexTouch Pen Inject 0.28 mLs (28 Units total) into the skin daily. Inject 28 units under the skin daily. (Patient taking differently: Inject 26 Units into the skin daily. Inject 26 units under the skin daily.) 3 pen 3  . insulin lispro (HUMALOG KWIKPEN) 100 UNIT/ML KwikPen Inject 0.06-0.1 mLs (6-10 Units  total) into the skin 3 (three) times daily. Inject 6-10 units under skin skin 3 times daily before meals. (Patient taking differently: Inject 5 Units into the skin 3 (three) times daily. Inject 5 units under skin skin 3 times daily before meals.) 2 pen 3  . Insulin Pen Needle (BD PEN NEEDLE NANO U/F) 32G X 4 MM MISC 1 each by Does not apply route daily. USE PEN NEEDLES TO INJECT INSULIN 5 TIMES DAILY. DX:E11.65 200 each 11  . liraglutide (VICTOZA) 18 MG/3ML SOPN Inject 0.3 mLs (1.8 mg total) into the skin daily. 9 mL 3  . loperamide (IMODIUM A-D) 2 MG tablet Take 2 mg by mouth 4 (four) times daily as needed for diarrhea or loose stools.    . MULTIPLE VITAMIN PO Take 1 packet by mouth daily.    . sodium chloride (OCEAN) 0.65 % SOLN nasal spray Place 1 spray into both nostrils as needed for congestion.    Marland Kitchen zolpidem (AMBIEN) 10 MG tablet Take 1 tablet (10 mg total) by mouth at bedtime as needed for up to 30 days for sleep. 15 tablet 1   No current facility-administered medications on file prior to visit.     Review of Systems: a complete, 10pt review of systems was completed with pertinent positives and negatives as documented in the HPI  Physical Exam: There were no vitals filed for this visit. Gen: alert and well appearing Eye: extraocular motion intact, no scleral icterus ENT: moist mucus membranes, dentition intact Neck: no mass or thyromegaly Chest:  unlabored respirations, symmetrical air entry, clear bilaterally CV: regular rate and rhythm, no pedal edema Abdomen: soft, nontender, nondistended. No mass or organomegaly.  There is not any palpable appreciable hernia on either groin with a Salva.  Multiple well-healed surgical scars. MSK: strength symmetrical throughout, no deformity Neuro: grossly intact, normal gait Psych: normal mood and affect, appropriate insight Skin: warm and dry, no rash or lesion on limited exam   CBC Latest Ref Rng & Units 08/19/2018 04/26/2018 04/24/2018  WBC 4.0 - 10.5 K/uL 4.7 7.1 5.9  Hemoglobin 13.0 - 17.0 g/dL 16.3 16.8 16.9  Hematocrit 39.0 - 52.0 % 47.7 48.4 49.0  Platelets 150.0 - 400.0 K/uL 244.0 212 203.0    CMP Latest Ref Rng & Units 08/16/2018 07/19/2018 04/26/2018  Glucose 70 - 99 mg/dL 99 347(H) 187(H)  BUN 6 - 23 mg/dL 16 22 13   Creatinine 0.40 - 1.50 mg/dL 0.88 1.10 0.97  Sodium 135 - 145 mEq/L 135 134(L) 133(L)  Potassium 3.5 - 5.1 mEq/L 4.6 4.6 4.2  Chloride 96 - 112 mEq/L 101 101 98  CO2 19 - 32 mEq/L 24 24 24   Calcium 8.4 - 10.5 mg/dL 10.0 9.9 10.0  Total Protein 6.0 - 8.3 g/dL - 7.7 7.3  Total Bilirubin 0.2 - 1.2 mg/dL - 0.3 0.6  Alkaline Phos 39 - 117 U/L - 135(H) -  AST 0 - 37 U/L - 22 20  ALT 0 - 53 U/L - 39 23    Lab Results  Component Value Date   INR 1.10 08/16/2014    Imaging: No results found.    A/P: INGUINODYNIA, RIGHT (R10.31) Story: He does have an ultrasound demonstrating a recurrent hernia on the right side. We discussed the option of nonoperative management with physical therapy and local injections/pain management, versus laparoscopic exploration. We discussed risks of bleeding, infection, pain, scarring, significant risk of injury to intra-abdominal structures given known adhesive disease from prior large mesh implant, and risk of  injury to pelvic structures including bladder, nerves, blood vessels, as well as risk of failure to resolve symptoms. Questions were  welcomed and answered. He desires to proceed with surgical intervention.   Romana Juniper, MD [facility], [place] Pager [phone]

## 2018-09-09 MED FILL — VICTOZA 18 MG/3 ML INJECT P: 18 | 30 days supply | Qty: 9 | Fill #0

## 2018-09-09 MED FILL — HUMALOG 100 UNITS/ML KWIKPE: 100 | 19 days supply | Qty: 6 | Fill #2

## 2018-09-09 MED FILL — ULTICARE PEN NDL 4MM 32G: 32G X 4 MM | 40 days supply | Qty: 200 | Fill #0

## 2018-09-10 ENCOUNTER — Ambulatory Visit: Payer: Self-pay | Admitting: General Surgery

## 2018-09-10 NOTE — H&P (View-Only) (Signed)
History of Present Illness Albert Ok MD; 09/10/2018 12:13 PM) The patient is a 63 year old male who presents with an inguinal hernia. Chief Complaint: Right inguinal hernia  Patient is a 63 year old male who recently saw Dr. Kae Heller my partner secondary to right inguinal hernia that was seen on ultrasound. Patient had a history of laparoscopic ventral hernia repair with mesh, open right inguinal hernia repair with mesh, appendectomy. Patient was thought to be a better candidate for TEP laparoscopic hernia repair  Patient does state that he has some discomfort in the right inguinal area usually with things like Valsalva, exercise. He states he has noticed minimal bulge the area. There is been no signs or symptoms of incarceration or strangulation.    Past Surgical History Andreas Blower, [place]; 09/10/2018 12:03 PM) Appendectomy  Gallbladder Surgery - Laparoscopic  Laparoscopic Inguinal Hernia Surgery  Right. Shoulder Surgery  Right. Ventral / Umbilical Hernia Surgery  Right.  Medication History (Armen Glo Herring, CMA; 09/10/2018 12:04 PM) Aspirin (81MG  Tablet, Oral) Active. Invokana (300MG  Tablet, Oral) Active. Victoza (18MG /3ML Soln Pen-inj, Subcutaneous) Active. Tyler Aas FlexTouch (100UNIT/ML Soln Pen-inj, Subcutaneous) Active. HumaLOG KwikPen (100UNIT/ML Soln Pen-inj, Subcutaneous) Active. Sodium Chloride (0.65% Solution, Nasal) Active. Lipitor (40MG  Tablet, Oral) Active. Imodium Advanced (2-125MG  Tablet Chewable, Oral) Active. Ambien (5MG  Tablet, Oral) Active. Medications Reconciled  Social History Andreas Blower, [place]; 09/10/2018 12:03 PM) Alcohol use  Remotely quit alcohol use. Caffeine use  Coffee. No drug use  Tobacco use  Never smoker.  Family History Andreas Blower, [place]; 09/10/2018 12:03 PM) Alcohol Abuse  Father. Cervical Cancer  Mother. Ovarian Cancer  Mother. Thyroid problems  Mother.  Other Problems Andreas Blower, [place]; 09/10/2018 12:03  PM) Diabetes Mellitus  High blood pressure  Inguinal Hernia  Umbilical Hernia Repair     Review of Systems Albert Ok MD; 09/10/2018 12:12 PM) General Not Present- Appetite Loss, Chills, Fatigue, Fever, Night Sweats, Weight Gain and Weight Loss. Skin Not Present- Change in Wart/Mole, Dryness, Hives, Jaundice, New Lesions, Non-Healing Wounds, Rash and Ulcer. HEENT Present- Wears glasses/contact lenses. Not Present- Earache, Hearing Loss, Hoarseness, Nose Bleed, Oral Ulcers, Ringing in the Ears, Seasonal Allergies, Sinus Pain, Sore Throat, Visual Disturbances and Yellow Eyes. Respiratory Not Present- Bloody sputum, Chronic Cough, Difficulty Breathing, Snoring and Wheezing. Breast Not Present- Breast Mass, Breast Pain, Nipple Discharge and Skin Changes. Cardiovascular Not Present- Chest Pain, Difficulty Breathing Lying Down, Leg Cramps, Palpitations, Rapid Heart Rate, Shortness of Breath and Swelling of Extremities. Gastrointestinal Present- Constipation. Not Present- Abdominal Pain, Bloating, Bloody Stool, Change in Bowel Habits, Chronic diarrhea, Difficulty Swallowing, Excessive gas, Gets full quickly at meals, Hemorrhoids, Indigestion, Nausea, Rectal Pain and Vomiting. Male Genitourinary Present- Impotence. Not Present- Blood in Urine, Change in Urinary Stream, Frequency, Nocturia, Painful Urination, Urgency and Urine Leakage. Musculoskeletal Not Present- Back Pain, Joint Pain, Joint Stiffness, Muscle Pain, Muscle Weakness and Swelling of Extremities. Neurological Not Present- Decreased Memory, Fainting, Headaches, Numbness, Seizures, Tingling, Tremor, Trouble walking and Weakness. Psychiatric Not Present- Anxiety, Bipolar, Change in Sleep Pattern, Depression, Fearful and Frequent crying. Endocrine Not Present- Cold Intolerance, Excessive Hunger, Hair Changes, Heat Intolerance, Hot flashes and New Diabetes. Hematology Not Present- Blood Thinners, Easy Bruising, Excessive bleeding, Gland  problems, HIV and Persistent Infections. All other systems negative  Vitals (Armen Ferguson CMA; 09/10/2018 12:04 PM) 09/10/2018 12:03 PM Weight: 195 lb Height: 72in Body Surface Area: 2.11 m Body Mass Index: 26.45 kg/m  Temp.: 98.16F  Pulse: 96 (Regular)  P.OX: 98% (Room air) BP: 118/78 (Sitting, Left Arm, Standard)  Physical Exam Albert Ok MD; 09/10/2018 12:13 PM) The physical exam findings are as follows: Note:Constitutional: No acute distress, conversant, appears stated age  Eyes: Anicteric sclerae, moist conjunctiva, no lid lag  Neck: No thyromegaly, trachea midline, no cervical lymphadenopathy  Lungs: Clear to auscultation biilaterally, normal respiratory effot  Cardiovascular: regular rate & rhythm, no murmurs, no peripheal edema, pedal pulses 2+  GI: Soft, no masses or hepatosplenomegaly, non-tender to palpation  MSK: Normal gait, no clubbing cyanosis, edema  Skin: No rashes, palpation reveals normal skin turgor  Psychiatric: Appropriate judgment and insight, oriented to person, place, and time  Abdomen Inspection Hernias - Inguinal hernia - Right - Reducible.    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 1. The patient will like to proceed to the operating room for laparoscopic right inguinal hernia repair with mesh.  2. I discussed with the patient the signs and symptoms of incarceration and strangulation and the need to proceed to the ER should they occur.  3. I discussed with the patient the risks and benefits of the procedure to include but not limited to: Infection, bleeding, damage to surrounding structures, possible need for further surgery, possible nerve pain, and possible recurrence. The patient was understanding and wishes to proceed.

## 2018-09-10 NOTE — H&P (Signed)
History of Present Illness Ralene Ok MD; 09/10/2018 12:13 PM) The patient is a 63 year old male who presents with an inguinal hernia. Chief Complaint: Right inguinal hernia  Patient is a 63 year old male who recently saw Dr. Kae Heller my partner secondary to right inguinal hernia that was seen on ultrasound. Patient had a history of laparoscopic ventral hernia repair with mesh, open right inguinal hernia repair with mesh, appendectomy. Patient was thought to be a better candidate for TEP laparoscopic hernia repair  Patient does state that he has some discomfort in the right inguinal area usually with things like Valsalva, exercise. He states he has noticed minimal bulge the area. There is been no signs or symptoms of incarceration or strangulation.    Past Surgical History Andreas Blower, [place]; 09/10/2018 12:03 PM) Appendectomy  Gallbladder Surgery - Laparoscopic  Laparoscopic Inguinal Hernia Surgery  Right. Shoulder Surgery  Right. Ventral / Umbilical Hernia Surgery  Right.  Medication History (Armen Glo Herring, CMA; 09/10/2018 12:04 PM) Aspirin (81MG  Tablet, Oral) Active. Invokana (300MG  Tablet, Oral) Active. Victoza (18MG /3ML Soln Pen-inj, Subcutaneous) Active. Tyler Aas FlexTouch (100UNIT/ML Soln Pen-inj, Subcutaneous) Active. HumaLOG KwikPen (100UNIT/ML Soln Pen-inj, Subcutaneous) Active. Sodium Chloride (0.65% Solution, Nasal) Active. Lipitor (40MG  Tablet, Oral) Active. Imodium Advanced (2-125MG  Tablet Chewable, Oral) Active. Ambien (5MG  Tablet, Oral) Active. Medications Reconciled  Social History Andreas Blower, [place]; 09/10/2018 12:03 PM) Alcohol use  Remotely quit alcohol use. Caffeine use  Coffee. No drug use  Tobacco use  Never smoker.  Family History Andreas Blower, [place]; 09/10/2018 12:03 PM) Alcohol Abuse  Father. Cervical Cancer  Mother. Ovarian Cancer  Mother. Thyroid problems  Mother.  Other Problems Andreas Blower, [place]; 09/10/2018 12:03  PM) Diabetes Mellitus  High blood pressure  Inguinal Hernia  Umbilical Hernia Repair     Review of Systems Ralene Ok MD; 09/10/2018 12:12 PM) General Not Present- Appetite Loss, Chills, Fatigue, Fever, Night Sweats, Weight Gain and Weight Loss. Skin Not Present- Change in Wart/Mole, Dryness, Hives, Jaundice, New Lesions, Non-Healing Wounds, Rash and Ulcer. HEENT Present- Wears glasses/contact lenses. Not Present- Earache, Hearing Loss, Hoarseness, Nose Bleed, Oral Ulcers, Ringing in the Ears, Seasonal Allergies, Sinus Pain, Sore Throat, Visual Disturbances and Yellow Eyes. Respiratory Not Present- Bloody sputum, Chronic Cough, Difficulty Breathing, Snoring and Wheezing. Breast Not Present- Breast Mass, Breast Pain, Nipple Discharge and Skin Changes. Cardiovascular Not Present- Chest Pain, Difficulty Breathing Lying Down, Leg Cramps, Palpitations, Rapid Heart Rate, Shortness of Breath and Swelling of Extremities. Gastrointestinal Present- Constipation. Not Present- Abdominal Pain, Bloating, Bloody Stool, Change in Bowel Habits, Chronic diarrhea, Difficulty Swallowing, Excessive gas, Gets full quickly at meals, Hemorrhoids, Indigestion, Nausea, Rectal Pain and Vomiting. Male Genitourinary Present- Impotence. Not Present- Blood in Urine, Change in Urinary Stream, Frequency, Nocturia, Painful Urination, Urgency and Urine Leakage. Musculoskeletal Not Present- Back Pain, Joint Pain, Joint Stiffness, Muscle Pain, Muscle Weakness and Swelling of Extremities. Neurological Not Present- Decreased Memory, Fainting, Headaches, Numbness, Seizures, Tingling, Tremor, Trouble walking and Weakness. Psychiatric Not Present- Anxiety, Bipolar, Change in Sleep Pattern, Depression, Fearful and Frequent crying. Endocrine Not Present- Cold Intolerance, Excessive Hunger, Hair Changes, Heat Intolerance, Hot flashes and New Diabetes. Hematology Not Present- Blood Thinners, Easy Bruising, Excessive bleeding, Gland  problems, HIV and Persistent Infections. All other systems negative  Vitals (Armen Ferguson CMA; 09/10/2018 12:04 PM) 09/10/2018 12:03 PM Weight: 195 lb Height: 72in Body Surface Area: 2.11 m Body Mass Index: 26.45 kg/m  Temp.: 98.10F  Pulse: 96 (Regular)  P.OX: 98% (Room air) BP: 118/78 (Sitting, Left Arm, Standard)  Physical Exam Ralene Ok MD; 09/10/2018 12:13 PM) The physical exam findings are as follows: Note:Constitutional: No acute distress, conversant, appears stated age  Eyes: Anicteric sclerae, moist conjunctiva, no lid lag  Neck: No thyromegaly, trachea midline, no cervical lymphadenopathy  Lungs: Clear to auscultation biilaterally, normal respiratory effot  Cardiovascular: regular rate & rhythm, no murmurs, no peripheal edema, pedal pulses 2+  GI: Soft, no masses or hepatosplenomegaly, non-tender to palpation  MSK: Normal gait, no clubbing cyanosis, edema  Skin: No rashes, palpation reveals normal skin turgor  Psychiatric: Appropriate judgment and insight, oriented to person, place, and time  Abdomen Inspection Hernias - Inguinal hernia - Right - Reducible.    Assessment & Plan Ralene Ok MD; 09/10/2018 12:14 PM) RECURRENT RIGHT INGUINAL HERNIA (K40.91) Impression: 63 year old male with recurrent right inguinal hernia. 1. The patient will like to proceed to the operating room for laparoscopic right inguinal hernia repair with mesh.  2. I discussed with the patient the signs and symptoms of incarceration and strangulation and the need to proceed to the ER should they occur.  3. I discussed with the patient the risks and benefits of the procedure to include but not limited to: Infection, bleeding, damage to surrounding structures, possible need for further surgery, possible nerve pain, and possible recurrence. The patient was understanding and wishes to proceed.

## 2018-09-11 MED FILL — ZOLPIDEM TARTRATE 10 MG TAB: 10 | 15 days supply | Qty: 15 | Fill #1

## 2018-09-16 NOTE — Pre-Procedure Instructions (Signed)
Albert [name]  09/16/2018      [place] [place] [address] [place] [address] Phone: [phone] Fax: [phone]    Your procedure is scheduled on Monday February 10th.  Report to [facility] Admitting at 9:00 A.M.  Call this number if you have problems the morning of surgery:  [phone]   Remember:  Do not eat or drink after midnight.      Take these medicines the morning of surgery with A SIP OF WATER  atorvastatin (LIPITOR) fluticasone (FLONASE) sodium chloride (OCEAN)  Follow your surgeon's instructions on when to stop Asprin.  If no instructions were given by your surgeon then you will need to call the office to get those instructions.    7 days prior to surgery STOP taking any Aspirin(unless otherwise instructed by your surgeon), Aleve, Naproxen, Ibuprofen, Motrin, Advil, Goody's, BC's, all herbal medications, fish oil, and all vitamins     HOW TO MANAGE YOUR DIABETES BEFORE AND AFTER SURGERY  Why is it important to control my blood sugar before and after surgery? . Improving blood sugar levels before and after surgery helps healing and can limit problems. . A way of improving blood sugar control is eating a healthy diet by: o  Eating less sugar and carbohydrates o  Increasing activity/exercise o  Talking with your doctor about reaching your blood sugar goals . High blood sugars (greater than 180 mg/dL) can raise your risk of infections and slow your recovery, so you will need to focus on controlling your diabetes during the weeks before surgery. . Make sure that the doctor who takes care of your diabetes knows about your planned surgery including the date and location.  How do I manage my blood sugar before surgery? . Check your blood sugar at least 4 times a day, starting 2 days before surgery, to make sure that the level is not too high or low. o Check your blood  sugar the morning of your surgery when you wake up and every 2 hours until you get to the Short Stay unit. . If your blood sugar is less than 70 mg/dL, you will need to treat for low blood sugar: o Do not take insulin. o Treat a low blood sugar (less than 70 mg/dL) with  cup of clear juice (cranberry or apple), 4 glucose tablets, OR glucose gel. Recheck blood sugar in 15 minutes after treatment (to make sure it is greater than 70 mg/dL). If your blood sugar is not greater than 70 mg/dL on recheck, call [phone] o  for further instructions. . Report your blood sugar to the short stay nurse when you get to Short Stay.  . If you are admitted to the hospital after surgery: o Your blood sugar will be checked by the staff and you will probably be given insulin after surgery (instead of oral diabetes medicines) to make sure you have good blood sugar levels. o The goal for blood sugar control after surgery is 80-180 mg/dL.     WHAT DO I DO ABOUT MY DIABETES MEDICATION?   Marland Kitchen Do not take oral diabetes medicines (pills):  the morning of surgery.  . THE NIGHT BEFORE SURGERY,  Take 13 units of Tresiba insulin. Do not take PM dose of Victoza. Do not take bedtime dose of Humalog       . THE MORNING OF SURGERY, take 13 units of [place]  insulin. Do not take Victoza.  If your blood sugar is over 220, take 2 units of Humalog.  . The day of surgery, do not take other diabetes injectables,(exenatide ER), Victoza (liraglutide)   Do not wear jewelry  Do not wear lotions, powders, or colognes, or deodorant.  Do not shave 48 hours prior to surgery.  Men may shave face and neck.  Do not bring valuables to the hospital.  [facility] is not responsible for any belongings or valuables.  Contacts, eyeglasses, hearing aids, dentures or bridgework may not be worn into surgery.  Leave your suitcase in the car.  After surgery it may be brought to your room.  For patients admitted to the hospital, discharge time  will be determined by your treatment team.  Patients discharged the day of surgery will not be allowed to drive home.   [place]- Preparing For Surgery  Before surgery, you can play an important role. Because skin is not sterile, your skin needs to be as free of germs as possible. You can reduce the number of germs on your skin by washing with CHG (chlorahexidine gluconate) Soap before surgery.  CHG is an antiseptic cleaner which kills germs and bonds with the skin to continue killing germs even after washing.    Oral Hygiene is also important to reduce your risk of infection.  Remember - BRUSH YOUR TEETH THE MORNING OF SURGERY WITH YOUR REGULAR TOOTHPASTE  Please do not use if you have an allergy to CHG or antibacterial soaps. If your skin becomes reddened/irritated stop using the CHG.  Do not shave (including legs and underarms) for at least 48 hours prior to first CHG shower. It is OK to shave your face.  Please follow these instructions carefully.   1. Shower the NIGHT BEFORE SURGERY and the MORNING OF SURGERY with CHG.   2. If you chose to wash your hair, wash your hair first as usual with your normal shampoo.  3. After you shampoo, rinse your hair and body thoroughly to remove the shampoo.  4. Use CHG as you would any other liquid soap. You can apply CHG directly to the skin and wash gently with a scrungie or a clean washcloth.   5. Apply the CHG Soap to your body ONLY FROM THE NECK DOWN.  Do not use on open wounds or open sores. Avoid contact with your eyes, ears, mouth and genitals (private parts). Wash Face and genitals (private parts)  with your normal soap.  6. Wash thoroughly, paying special attention to the area where your surgery will be performed.  7. Thoroughly rinse your body with warm water from the neck down.  8. DO NOT shower/wash with your normal soap after using and rinsing off the CHG Soap.  9. Pat yourself dry with a CLEAN TOWEL.  10. Wear CLEAN PAJAMAS to  bed the night before surgery, wear comfortable clothes the morning of surgery  11. Place CLEAN SHEETS on your bed the night of your first shower and DO NOT SLEEP WITH PETS.    Day of Surgery: Shower as stated above. Do not apply any deodorants/lotions.  Please wear clean clothes to the hospital/surgery center.   Remember to brush your teeth WITH YOUR REGULAR TOOTHPASTE.   Please read over the following fact sheets that you were given.

## 2018-09-17 ENCOUNTER — Encounter (HOSPITAL_COMMUNITY): Payer: Self-pay

## 2018-09-17 ENCOUNTER — Other Ambulatory Visit: Payer: Self-pay

## 2018-09-17 ENCOUNTER — Encounter (HOSPITAL_COMMUNITY)
Admission: RE | Admit: 2018-09-17 | Discharge: 2018-09-17 | Disposition: A | Discharge status: Home / Self Care | Payer: No Typology Code available for payment source | Source: Ambulatory Visit | Attending: General Surgery | Admitting: General Surgery

## 2018-09-17 DIAGNOSIS — Z01812 Encounter for preprocedural laboratory examination: Secondary | ICD-10-CM | POA: Diagnosis not present

## 2018-09-17 LAB — BASIC METABOLIC PANEL
Anion gap: 11 (ref 5–15)
BUN: 17 mg/dL (ref 6–20)
CO2: 22 mmol/L (ref 22–32)
Calcium: 9.8 mg/dL (ref 8.9–10.3)
Chloride: 104 mmol/L (ref 98–111)
Creatinine, Ser: 0.8 mg/dL (ref 0.61–1.24)
GFR calc non Af Amer: >60 mL/min (ref >60)
Glucose, Bld: 116 mg/dL — ABNORMAL HIGH (ref 70–99)
Potassium: 4.5 mmol/L (ref 3.5–5.1)
Sodium: 137 mmol/L (ref 135–145)

## 2018-09-17 LAB — CBC
HCT: 51.6 % (ref 39.0–52.0)
Hemoglobin: 16.6 g/dL (ref 13.0–17.0)
MCH: 29.4 pg (ref 26.0–34.0)
MCHC: 32.2 g/dL (ref 30.0–36.0)
MCV: 91.3 fL (ref 80.0–100.0)
Platelets: 238 10*3/uL (ref 150–400)
RBC: 5.65 MIL/uL (ref 4.22–5.81)
RDW: 13.2 % (ref 11.5–15.5)
WBC: 6.4 10*3/uL (ref 4.0–10.5)
nRBC: 0 % (ref 0.0–0.2)

## 2018-09-17 LAB — HEMOGLOBIN A1C
Hgb A1c MFr Bld: 7.4 % — ABNORMAL HIGH (ref 4.8–5.6)
Mean Plasma Glucose: 165.68 mg/dL

## 2018-09-17 LAB — GLUCOSE, CAPILLARY: GLUCOSE-CAPILLARY: 108 mg/dL — AB (ref 70–99)

## 2018-09-17 NOTE — Progress Notes (Signed)
PCP - Dr. [name] Cardiologist - denies  Chest x-ray - N/A EKG - 01/11/18 Stress Test - 2016 ECHO - denies Cardiac Cath - denies  Sleep Study - denies   Fasting Blood Sugar - 95-107 Checks Blood Sugar 4-6 times a day   Aspirin Instructions: patient instructed to contact Dr. Rosendo Gros for when to stop ASA. Patient instructed to hold NSAID's, herbal medications, fish oil and vitamins 7 days prior to surgery.   Anesthesia review: no  Patient denies shortness of breath, fever, cough and chest pain at PAT appointment   Patient verbalized understanding of instructions that were given to them at the PAT appointment. Patient was also instructed that they will need to review over the PAT instructions again at home before surgery.

## 2018-09-22 NOTE — Anesthesia Preprocedure Evaluation (Addendum)
Anesthesia Evaluation  Patient identified by MRN, date of birth, ID band Patient awake    Reviewed: Allergy & Precautions, NPO status , Patient's Chart, lab work & pertinent test results  History of Anesthesia Complications Negative for: history of anesthetic complications  Airway Mallampati: II  TM Distance: >3 FB Neck ROM: Full    Dental  (+) Dental Advisory Given, Missing,    Pulmonary neg pulmonary ROS,    Pulmonary exam normal breath sounds clear to auscultation       Cardiovascular hypertension, + CAD  Normal cardiovascular exam Rhythm:Regular Rate:Normal  EKG 12/2017: NSR, RBBB   Neuro/Psych negative neurological ROS     GI/Hepatic negative GI ROS, Neg liver ROS,   Endo/Other  diabetes, Type 2, Oral Hypoglycemic Agents, Insulin Dependent  Renal/GU negative Renal ROS     Musculoskeletal negative musculoskeletal ROS (+)   Abdominal   Peds  Hematology negative hematology ROS (+)   Anesthesia Other Findings Day of surgery medications reviewed with the patient.  Reproductive/Obstetrics                            Anesthesia Physical Anesthesia Plan  ASA: II  Anesthesia Plan: General   Post-op Pain Management:    Induction: Intravenous  PONV Risk Score and Plan: 2 and Treatment may vary due to age or medical condition, Ondansetron, Dexamethasone and Midazolam  Airway Management Planned: Oral ETT  Additional Equipment:   Intra-op Plan:   Post-operative Plan: Extubation in OR  Informed Consent: I have reviewed the patients History and Physical, chart, labs and discussed the procedure including the risks, benefits and alternatives for the proposed anesthesia with the patient or authorized representative who has indicated his/her understanding and acceptance.     Dental advisory given  Plan Discussed with: CRNA  Anesthesia Plan Comments:        Anesthesia Quick  Evaluation

## 2018-09-23 ENCOUNTER — Encounter (HOSPITAL_COMMUNITY): Payer: Self-pay | Admitting: Certified Registered"

## 2018-09-23 ENCOUNTER — Encounter (HOSPITAL_COMMUNITY)
Admission: RE | Disposition: A | Discharge status: Home / Self Care | Payer: Self-pay | Source: Home / Self Care | Attending: General Surgery

## 2018-09-23 ENCOUNTER — Other Ambulatory Visit: Payer: Self-pay

## 2018-09-23 ENCOUNTER — Ambulatory Visit (HOSPITAL_COMMUNITY): Payer: No Typology Code available for payment source | Admitting: Anesthesiology

## 2018-09-23 ENCOUNTER — Ambulatory Visit (HOSPITAL_COMMUNITY)
Admission: RE | Admit: 2018-09-23 | Discharge: 2018-09-23 | Disposition: A | Discharge status: Home / Self Care | Payer: No Typology Code available for payment source | Attending: General Surgery | Admitting: General Surgery

## 2018-09-23 DIAGNOSIS — Z79899 Other long term (current) drug therapy: Secondary | ICD-10-CM | POA: Diagnosis not present

## 2018-09-23 DIAGNOSIS — Z794 Long term (current) use of insulin: Secondary | ICD-10-CM | POA: Insufficient documentation

## 2018-09-23 DIAGNOSIS — I10 Essential (primary) hypertension: Secondary | ICD-10-CM | POA: Insufficient documentation

## 2018-09-23 DIAGNOSIS — I4510 Unspecified right bundle-branch block: Secondary | ICD-10-CM | POA: Insufficient documentation

## 2018-09-23 DIAGNOSIS — K5900 Constipation, unspecified: Secondary | ICD-10-CM | POA: Diagnosis not present

## 2018-09-23 DIAGNOSIS — Z7982 Long term (current) use of aspirin: Secondary | ICD-10-CM | POA: Insufficient documentation

## 2018-09-23 DIAGNOSIS — Z8349 Family history of other endocrine, nutritional and metabolic diseases: Secondary | ICD-10-CM | POA: Insufficient documentation

## 2018-09-23 DIAGNOSIS — K4091 Unilateral inguinal hernia, without obstruction or gangrene, recurrent: Secondary | ICD-10-CM | POA: Diagnosis present

## 2018-09-23 DIAGNOSIS — Z8041 Family history of malignant neoplasm of ovary: Secondary | ICD-10-CM | POA: Insufficient documentation

## 2018-09-23 DIAGNOSIS — I2510 Atherosclerotic heart disease of native coronary artery without angina pectoris: Secondary | ICD-10-CM | POA: Insufficient documentation

## 2018-09-23 DIAGNOSIS — E119 Type 2 diabetes mellitus without complications: Secondary | ICD-10-CM | POA: Diagnosis not present

## 2018-09-23 DIAGNOSIS — Z8049 Family history of malignant neoplasm of other genital organs: Secondary | ICD-10-CM | POA: Diagnosis not present

## 2018-09-23 DIAGNOSIS — Z811 Family history of alcohol abuse and dependence: Secondary | ICD-10-CM | POA: Insufficient documentation

## 2018-09-23 HISTORY — PX: INGUINAL HERNIA REPAIR: SHX194

## 2018-09-23 LAB — GLUCOSE, CAPILLARY
Glucose-Capillary: 91 mg/dL (ref 70–99)
Glucose-Capillary: 117 mg/dL — ABNORMAL HIGH (ref 70–99)

## 2018-09-23 SURGERY — REPAIR, HERNIA, INGUINAL, LAPAROSCOPIC
Anesthesia: General | Site: Groin | Laterality: Right

## 2018-09-23 MED ORDER — ACETAMINOPHEN 500 MG PO TABS
ORAL_TABLET | ORAL | Status: AC
  Administered 2018-09-23: 1000 mg via ORAL
  Filled 2018-09-23: qty 2

## 2018-09-23 MED ORDER — ONDANSETRON HCL 4 MG/2ML IJ SOLN
INTRAMUSCULAR | Status: AC
  Filled 2018-09-23: qty 4

## 2018-09-23 MED ORDER — PROPOFOL 10 MG/ML IV BOLUS
INTRAVENOUS | Status: AC
  Filled 2018-09-23: qty 20

## 2018-09-23 MED ORDER — ROCURONIUM BROMIDE 50 MG/5ML IV SOSY
PREFILLED_SYRINGE | INTRAVENOUS | Status: DC | PRN
  Administered 2018-09-23: 50 mg via INTRAVENOUS

## 2018-09-23 MED ORDER — CHLORHEXIDINE GLUCONATE CLOTH 2 % EX PADS: 6.0000 | MEDICATED_PAD | Freq: Once | CUTANEOUS | Status: DC

## 2018-09-23 MED ORDER — CEFAZOLIN SODIUM-DEXTROSE 2-4 GM/100ML-% IV SOLN
2.0000 g | INTRAVENOUS | Status: AC
  Administered 2018-09-23: 2 g via INTRAVENOUS

## 2018-09-23 MED ORDER — DEXAMETHASONE SODIUM PHOSPHATE 10 MG/ML IJ SOLN
INTRAMUSCULAR | Status: AC
  Filled 2018-09-23: qty 2

## 2018-09-23 MED ORDER — CEFAZOLIN SODIUM-DEXTROSE 2-4 GM/100ML-% IV SOLN
INTRAVENOUS | Status: AC
  Filled 2018-09-23: qty 100

## 2018-09-23 MED ORDER — FENTANYL CITRATE (PF) 100 MCG/2ML IJ SOLN
INTRAMUSCULAR | Status: AC
  Filled 2018-09-23: qty 2

## 2018-09-23 MED ORDER — LIDOCAINE 2% (20 MG/ML) 5 ML SYRINGE
INTRAMUSCULAR | Status: DC | PRN
  Administered 2018-09-23: 80 mg via INTRAVENOUS

## 2018-09-23 MED ORDER — ACETAMINOPHEN 500 MG PO TABS
1000.0000 mg | ORAL_TABLET | ORAL | Status: AC
  Administered 2018-09-23: 1000 mg via ORAL

## 2018-09-23 MED ORDER — PROPOFOL 10 MG/ML IV BOLUS
INTRAVENOUS | Status: DC | PRN
  Administered 2018-09-23: 170 mg via INTRAVENOUS

## 2018-09-23 MED ORDER — LACTATED RINGERS IV SOLN
INTRAVENOUS | Status: DC
  Administered 2018-09-23: 10:00:00 via INTRAVENOUS

## 2018-09-23 MED ORDER — ACETAMINOPHEN 10 MG/ML IV SOLN: 1000.0000 mg | Freq: Once | INTRAVENOUS | Status: DC | PRN

## 2018-09-23 MED ORDER — 0.9 % SODIUM CHLORIDE (POUR BTL) OPTIME
TOPICAL | Status: DC | PRN
  Administered 2018-09-23: 1000 mL

## 2018-09-23 MED ORDER — SUGAMMADEX SODIUM 500 MG/5ML IV SOLN
INTRAVENOUS | Status: AC
  Filled 2018-09-23: qty 5

## 2018-09-23 MED ORDER — TRAMADOL HCL 50 MG PO TABS: 50.0000 mg | ORAL_TABLET | Freq: Four times a day (QID) | ORAL | 0 refills | Status: AC | PRN

## 2018-09-23 MED ORDER — MIDAZOLAM HCL 2 MG/2ML IJ SOLN
INTRAMUSCULAR | Status: AC
  Filled 2018-09-23: qty 2

## 2018-09-23 MED ORDER — FENTANYL CITRATE (PF) 100 MCG/2ML IJ SOLN
25.0000 ug | INTRAMUSCULAR | Status: DC | PRN
  Administered 2018-09-23: 50 ug via INTRAVENOUS

## 2018-09-23 MED ORDER — DEXAMETHASONE SODIUM PHOSPHATE 10 MG/ML IJ SOLN
INTRAMUSCULAR | Status: DC | PRN
  Administered 2018-09-23: 10 mg via INTRAVENOUS

## 2018-09-23 MED ORDER — MIDAZOLAM HCL 5 MG/5ML IJ SOLN
INTRAMUSCULAR | Status: DC | PRN
  Administered 2018-09-23: 2 mg via INTRAVENOUS

## 2018-09-23 MED ORDER — BUPIVACAINE HCL 0.25 % IJ SOLN
INTRAMUSCULAR | Status: DC | PRN
  Administered 2018-09-23: 6 mL

## 2018-09-23 MED ORDER — BUPIVACAINE HCL (PF) 0.25 % IJ SOLN
INTRAMUSCULAR | Status: AC
  Filled 2018-09-23: qty 60

## 2018-09-23 MED ORDER — OXYCODONE HCL 5 MG PO TABS
ORAL_TABLET | ORAL | Status: AC
  Administered 2018-09-23: 5 mg via ORAL
  Filled 2018-09-23: qty 1

## 2018-09-23 MED ORDER — GABAPENTIN 300 MG PO CAPS
300.0000 mg | ORAL_CAPSULE | ORAL | Status: AC
  Administered 2018-09-23: 300 mg via ORAL

## 2018-09-23 MED ORDER — GABAPENTIN 300 MG PO CAPS
ORAL_CAPSULE | ORAL | Status: AC
  Administered 2018-09-23: 300 mg via ORAL
  Filled 2018-09-23: qty 1

## 2018-09-23 MED ORDER — OXYCODONE HCL 5 MG PO TABS
5.0000 mg | ORAL_TABLET | Freq: Once | ORAL | Status: AC | PRN
  Administered 2018-09-23: 5 mg via ORAL

## 2018-09-23 MED ORDER — LIDOCAINE 2% (20 MG/ML) 5 ML SYRINGE
INTRAMUSCULAR | Status: AC
  Filled 2018-09-23: qty 10

## 2018-09-23 MED ORDER — FENTANYL CITRATE (PF) 250 MCG/5ML IJ SOLN
INTRAMUSCULAR | Status: AC
  Filled 2018-09-23: qty 5

## 2018-09-23 MED ORDER — OXYCODONE HCL 5 MG/5ML PO SOLN: 5.0000 mg | Freq: Once | ORAL | Status: AC | PRN

## 2018-09-23 MED ORDER — FENTANYL CITRATE (PF) 250 MCG/5ML IJ SOLN
INTRAMUSCULAR | Status: DC | PRN
  Administered 2018-09-23 (×5): 50 ug via INTRAVENOUS

## 2018-09-23 MED ORDER — ONDANSETRON HCL 4 MG/2ML IJ SOLN
INTRAMUSCULAR | Status: DC | PRN
  Administered 2018-09-23: 4 mg via INTRAVENOUS

## 2018-09-23 MED ORDER — SUGAMMADEX SODIUM 200 MG/2ML IV SOLN
INTRAVENOUS | Status: DC | PRN
  Administered 2018-09-23: 160 mg via INTRAVENOUS

## 2018-09-23 MED ORDER — STERILE WATER FOR IRRIGATION IR SOLN
Status: DC | PRN
  Administered 2018-09-23: 1000 mL

## 2018-09-23 MED ORDER — PROMETHAZINE HCL 25 MG/ML IJ SOLN: 6.2500 mg | INTRAMUSCULAR | Status: DC | PRN

## 2018-09-23 MED FILL — traMADol HCL 50 MG TABS: 50 | 5 days supply | Qty: 20 | Fill #0

## 2018-09-23 SURGICAL SUPPLY — 37 items
CANISTER SUCT 3000ML PPV (MISCELLANEOUS) IMPLANT
COVER SURGICAL LIGHT HANDLE (MISCELLANEOUS) ×2 IMPLANT
COVER WAND RF STERILE (DRAPES) ×2 IMPLANT
DEFOGGER SCOPE WARMER CLEARIFY (MISCELLANEOUS) IMPLANT
DERMABOND ADVANCED (GAUZE/BANDAGES/DRESSINGS) ×1
DERMABOND ADVANCED .7 DNX12 (GAUZE/BANDAGES/DRESSINGS) ×1 IMPLANT
DISSECTOR BLUNT TIP ENDO 5MM (MISCELLANEOUS) IMPLANT
ELECT REM PT RETURN 9FT ADLT (ELECTROSURGICAL) ×2
ELECTRODE REM PT RTRN 9FT ADLT (ELECTROSURGICAL) ×1 IMPLANT
GLOVE BIO SURGEON STRL SZ7.5 (GLOVE) ×2 IMPLANT
GOWN STRL REUS W/ TWL LRG LVL3 (GOWN DISPOSABLE) ×2 IMPLANT
GOWN STRL REUS W/ TWL XL LVL3 (GOWN DISPOSABLE) ×1 IMPLANT
GOWN STRL REUS W/TWL LRG LVL3 (GOWN DISPOSABLE) ×2
GOWN STRL REUS W/TWL XL LVL3 (GOWN DISPOSABLE) ×1
KIT BASIN OR (CUSTOM PROCEDURE TRAY) ×2 IMPLANT
KIT TURNOVER KIT B (KITS) ×2 IMPLANT
MESH 3DMAX 5X7 RT XLRG (Mesh General) ×2 IMPLANT
NEEDLE INSUFFLATION 14GA 120MM (NEEDLE) IMPLANT
NS IRRIG 1000ML POUR BTL (IV SOLUTION) ×2 IMPLANT
PAD ARMBOARD 7.5X6 YLW CONV (MISCELLANEOUS) ×4 IMPLANT
RELOAD STAPLE HERNIA 4.0 BLUE (INSTRUMENTS) ×2 IMPLANT
RELOAD STAPLE HERNIA 4.8 BLK (STAPLE) IMPLANT
SCISSORS LAP 5X35 DISP (ENDOMECHANICALS) ×2 IMPLANT
SET IRRIG TUBING LAPAROSCOPIC (IRRIGATION / IRRIGATOR) IMPLANT
SET TUBE SMOKE EVAC HIGH FLOW (TUBING) ×2 IMPLANT
STAPLER HERNIA 12 8.5 360D (INSTRUMENTS) ×2 IMPLANT
SUT MNCRL AB 4-0 PS2 18 (SUTURE) ×2 IMPLANT
SUT VIC AB 1 CT1 27 (SUTURE)
SUT VIC AB 1 CT1 27XBRD ANBCTR (SUTURE) IMPLANT
SYRINGE TOOMEY DISP (SYRINGE) ×2 IMPLANT
TOWEL OR 17X24 6PK STRL BLUE (TOWEL DISPOSABLE) ×2 IMPLANT
TOWEL OR 17X26 10 PK STRL BLUE (TOWEL DISPOSABLE) ×2 IMPLANT
TRAY FOLEY W/BAG SLVR 14FR (SET/KITS/TRAYS/PACK) ×2 IMPLANT
TRAY LAPAROSCOPIC MC (CUSTOM PROCEDURE TRAY) ×2 IMPLANT
TROCAR CANNULA W/PORT DUAL 5MM (MISCELLANEOUS) ×2 IMPLANT
TROCAR XCEL 12X100 BLDLESS (ENDOMECHANICALS) ×2 IMPLANT
WATER STERILE IRR 1000ML POUR (IV SOLUTION) ×2 IMPLANT

## 2018-09-23 NOTE — Anesthesia Postprocedure Evaluation (Signed)
Anesthesia Post Note  Patient: Albert [name]  Procedure(s) Performed: LAPAROSCOPIC RIGHT  INGUINAL HERNIA REPAIR WITH MESH (Right Groin)     Patient location during evaluation: PACU Anesthesia Type: General Level of consciousness: awake and alert Pain management: pain level controlled Vital Signs Assessment: post-procedure vital signs reviewed and stable Respiratory status: spontaneous breathing, nonlabored ventilation and respiratory function stable Cardiovascular status: blood pressure returned to baseline and stable Postop Assessment: no apparent nausea or vomiting Anesthetic complications: no    Last Vitals:  Vitals:   09/23/18 1205 09/23/18 1220  BP: 120/79 (!) 144/84  Pulse: 82 91  Resp: 17 18  Temp:  36.4 C  SpO2: 93% 97%    Last Pain:  Vitals:   09/23/18 1220  TempSrc:   PainSc: [place]

## 2018-09-23 NOTE — Op Note (Signed)
09/23/2018  11:31 AM  PATIENT:  Albert [name]  63 y.o. male  PRE-OPERATIVE DIAGNOSIS:  Recurrent right inguinal hernia  POST-OPERATIVE DIAGNOSIS:  Right direct inguinal hernia  PROCEDURE:  Procedure(s): LAPAROSCOPIC RIGHT  INGUINAL HERNIA REPAIR WITH MESH (Right)  SURGEON:  Surgeon(s) and Role:    Ralene Ok, MD - Primary  ASSISTANTS: Algis Greenhouse, RNFA   ANESTHESIA:   local and general  EBL:  minimal   BLOOD ADMINISTERED:none  DRAINS: none   LOCAL MEDICATIONS USED:  BUPIVICAINE   SPECIMEN:  No Specimen  DISPOSITION OF SPECIMEN:  N/A  COUNTS:  YES  TOURNIQUET:  * No tourniquets in log *  DICTATION: .Dragon Dictation Counts: reported as correct x 2  Findings:  The patient had a small right direct hernia  Indications for procedure:  The patient is a 62 year old male with a right hernia for several months. Patient complained of symptomatology to his right inguinal area. Patient had a previous open right hernia repair with mesh.  The patient was taken back for elective inguinal hernia repair.  Details of the procedure: The patient was taken back to the operating room. The patient was placed in supine position with bilateral SCDs in place.  The patient was prepped and draped in the usual sterile fashion.  After appropriate anitbiotics were confirmed, a time-out was confirmed and all facts were verified.  0.25% Marcaine was used to infiltrate the umbilical area. A 11-blade was used to cut down the skin and blunt dissection was used to get the anterior fashion.  The anterior fascia was incised approximately 1 cm and the muscles were retracted laterally. Blunt dissection was then used to create a space in the preperitoneal area. At this time a 10 mm camera was then introduced into the space and advanced the pubic tubercle and a 12 mm trocar was placed over this and insufflation was started.  At this time and space was created from medial to laterally the preperitoneal  space.  Cooper's ligament was initially cleaned off.  The hernia was identified in the direct space. Dissection of the hernia sac was undertaken and it was fully reduced.  The transversalis fascia retracted spontaneously.    The spermatic cord and the vas deferens was identified and protected in all parts of the case. Cremasterics were cleaned off from the cord.  The peritoneum was taken down to approximately the umbilicus a Bard 3D Max mesh, size: Rachelle Hora, was  introduced into the preperitoneal space.  The mesh was brought over to cover the direct and indirect hernia spaces.  This was anchored into place and secured to Cooper's ligament with 4.4mm staples from a [place] hernia stapler. It was anchored to the anterior abdominal wall with 4.8 mm staples. The peritoneum was seen lying posterior to the mesh. There was no staples placed laterally. The insufflation was evacuated and the peritoneum was seen posterior to the mesh. The trochars were removed. The anterior fascia was reapproximated using #1 Vicryl on a UR- 6.  Intra-abdominal air was evacuated and the Veress needle removed. The skin was reapproximated using 4-0 Monocryl subcuticular fashion.  The skin was dressed with Dermabond.  The patient was awakened from general anesthesia and taken to recovery in stable condition.   PLAN OF CARE: Discharge to home after PACU  PATIENT DISPOSITION:  PACU - hemodynamically stable.   Delay start of Pharmacological VTE agent (>24hrs) due to surgical blood loss or risk of bleeding: not applicable

## 2018-09-23 NOTE — Interval H&P Note (Signed)
History and Physical Interval Note:  09/23/2018 10:02 AM  Albert [name]  has presented today for surgery, with the diagnosis of right inguinal hernia  The various methods of treatment have been discussed with the patient and family. After consideration of risks, benefits and other options for treatment, the patient has consented to  Procedure(s): LAPAROSCOPIC RIGHT  INGUINAL HERNIA REPAIR WITH MESH (Right) as a surgical intervention .  The patient's history has been reviewed, patient examined, no change in status, stable for surgery.  I have reviewed the patient's chart and labs.  Questions were answered to the patient's satisfaction.     Ralene Ok

## 2018-09-23 NOTE — Discharge Instructions (Signed)
[facility] _______[facility] °INGUINAL HERNIA REPAIR: POST OP INSTRUCTIONS ° °Always review your discharge instruction sheet given to you by the facility where your surgery was performed. °IF YOU HAVE DISABILITY OR FAMILY LEAVE FORMS, YOU MUST BRING THEM TO THE OFFICE FOR PROCESSING.   °DO NOT GIVE THEM TO YOUR DOCTOR. ° °1. A  prescription for pain medication may be given to you upon discharge.  Take your pain medication as prescribed, if needed.  If narcotic pain medicine is not needed, then you may take acetaminophen (Tylenol) or ibuprofen (Advil) as needed. °2. Take your usually prescribed medications unless otherwise directed. °If you need a refill on your pain medication, please contact your pharmacy.  They will contact our office to request authorization. Prescriptions will not be filled after 5 pm or on week-ends. °3. You should follow a light diet the first 24 hours after arrival home, such as soup and crackers, etc.  Be sure to include lots of fluids daily.  Resume your normal diet the day after surgery. °4.Most patients will experience some swelling and bruising around the umbilicus or in the groin and scrotum.  Ice packs and reclining will help.  Swelling and bruising can take several days to resolve.  °6. It is common to experience some constipation if taking pain medication after surgery.  Increasing fluid intake and taking a stool softener (such as Colace) will usually help or prevent this problem from occurring.  A mild laxative (Milk of Magnesia or Miralax) should be taken according to package directions if there are no bowel movements after 48 hours. °7. Unless discharge instructions indicate otherwise, you may remove your bandages 24-48 hours after surgery, and you may shower at that time.  You may have steri-strips (small skin tapes) in place directly over the incision.  These strips should be left on the skin for 7-10 days.  If your surgeon used skin glue on the incision, you may  shower in 24 hours.  The glue will flake off over the next 2-3 weeks.  Any sutures or staples will be removed at the office during your follow-up visit. °8. ACTIVITIES:  You may resume regular (light) daily activities beginning the next day--such as daily self-care, walking, climbing stairs--gradually increasing activities as tolerated.  You may have sexual intercourse when it is comfortable.  Refrain from any heavy lifting or straining until approved by your doctor. ° °a.You may drive when you are no longer taking prescription pain medication, you can comfortably wear a seatbelt, and you can safely maneuver your car and apply brakes. °b.RETURN TO WORK:   °_____________________________________________ ° °9.You should see your doctor in the office for a follow-up appointment approximately 2-3 weeks after your surgery.  Make sure that you call for this appointment within a day or two after you arrive home to insure a convenient appointment time. °10.OTHER INSTRUCTIONS: _________________________ °   _____________________________________ ° °WHEN TO CALL YOUR DOCTOR: °1. Fever over 101.0 °2. Inability to urinate °3. Nausea and/or vomiting °4. Extreme swelling or bruising °5. Continued bleeding from incision. °6. Increased pain, redness, or drainage from the incision ° °The clinic staff is available to answer your questions during regular business hours.  Please don’t hesitate to call and ask to speak to one of the nurses for clinical concerns.  If you have a medical emergency, go to the nearest emergency room or call 911.  A surgeon from [facility] is always on call at the hospital ° ° °[address], [address], [place], [place]  [address] ? ° [address] [address], [place], [place]   [address] °[phone] ? [phone] ? FAX [phone] °Web site: [email or web address] ° °

## 2018-09-23 NOTE — Anesthesia Procedure Notes (Signed)
Procedure Name: Intubation Date/Time: 09/23/2018 10:50 AM Performed by: Imagene Riches, CRNA Pre-anesthesia Checklist: Patient identified, Emergency Drugs available, Suction available and Patient being monitored Patient Re-evaluated:Patient Re-evaluated prior to induction Oxygen Delivery Method: Circle System Utilized Preoxygenation: Pre-oxygenation with 100% oxygen Induction Type: IV induction Ventilation: Mask ventilation without difficulty Laryngoscope Size: Miller and 3 Grade View: Grade I Tube type: Oral Tube size: 7.5 mm Number of attempts: 1 Airway Equipment and Method: Stylet and Oral airway Placement Confirmation: ETT inserted through vocal cords under direct vision,  positive ETCO2 and breath sounds checked- equal and bilateral Secured at: 22 cm Tube secured with: Tape Dental Injury: Teeth and Oropharynx as per pre-operative assessment

## 2018-09-23 NOTE — Transfer of Care (Signed)
Immediate Anesthesia Transfer of Care Note  Patient: Albert [name]  Procedure(s) Performed: LAPAROSCOPIC RIGHT  INGUINAL HERNIA REPAIR WITH MESH (Right Groin)  Patient Location: PACU  Anesthesia Type:General  Level of Consciousness: awake, alert  and oriented  Airway & Oxygen Therapy: Patient Spontanous Breathing and Patient connected to nasal cannula oxygen  Post-op Assessment: Report given to RN and Post -op Vital signs reviewed and stable  Post vital signs: Reviewed and stable  Last Vitals:  Vitals Value Taken Time  BP    Temp    Pulse 96 09/23/2018 11:48 AM  Resp    SpO2 97 % 09/23/2018 11:48 AM  Vitals shown include unvalidated device data.  Last Pain:  Vitals:   09/23/18 0913  TempSrc:   PainSc: 6       Patients Stated Pain Goal: 2 (20/91/98 0221)  Complications: No apparent anesthesia complications

## 2018-09-24 ENCOUNTER — Encounter (HOSPITAL_COMMUNITY): Payer: Self-pay | Admitting: General Surgery

## 2018-10-04 MED FILL — TRESIBA FLEXTOUCH 100 UNITS: 100 | 32 days supply | Qty: 9 | Fill #2

## 2018-10-09 ENCOUNTER — Other Ambulatory Visit: Payer: Self-pay | Admitting: Endocrinology

## 2018-10-09 ENCOUNTER — Encounter (HOSPITAL_COMMUNITY): Payer: Self-pay | Admitting: General Surgery

## 2018-10-09 MED FILL — VICTOZA 18 MG/3 ML INJECT P: 18 | 30 days supply | Qty: 9 | Fill #1 | Status: TO

## 2018-10-10 MED FILL — HUMALOG 100 UNITS/ML KWIKPE: 100 | 30 days supply | Qty: 9 | Fill #0 | Status: TO

## 2018-10-27 ENCOUNTER — Emergency Department (HOSPITAL_BASED_OUTPATIENT_CLINIC_OR_DEPARTMENT_OTHER)
Admission: EM | Admit: 2018-10-27 | Discharge: 2018-10-27 | Disposition: A | Discharge status: Home / Self Care | Payer: No Typology Code available for payment source | Attending: Emergency Medicine | Admitting: Emergency Medicine

## 2018-10-27 ENCOUNTER — Other Ambulatory Visit: Payer: Self-pay

## 2018-10-27 ENCOUNTER — Encounter (HOSPITAL_BASED_OUTPATIENT_CLINIC_OR_DEPARTMENT_OTHER): Payer: Self-pay | Admitting: Emergency Medicine

## 2018-10-27 DIAGNOSIS — I10 Essential (primary) hypertension: Secondary | ICD-10-CM | POA: Diagnosis not present

## 2018-10-27 DIAGNOSIS — E119 Type 2 diabetes mellitus without complications: Secondary | ICD-10-CM | POA: Insufficient documentation

## 2018-10-27 DIAGNOSIS — R519 Headache, unspecified: Secondary | ICD-10-CM

## 2018-10-27 DIAGNOSIS — I2510 Atherosclerotic heart disease of native coronary artery without angina pectoris: Secondary | ICD-10-CM | POA: Insufficient documentation

## 2018-10-27 DIAGNOSIS — Z7982 Long term (current) use of aspirin: Secondary | ICD-10-CM | POA: Insufficient documentation

## 2018-10-27 DIAGNOSIS — R51 Headache: Secondary | ICD-10-CM | POA: Insufficient documentation

## 2018-10-27 DIAGNOSIS — Z794 Long term (current) use of insulin: Secondary | ICD-10-CM | POA: Insufficient documentation

## 2018-10-27 DIAGNOSIS — Z79899 Other long term (current) drug therapy: Secondary | ICD-10-CM | POA: Diagnosis not present

## 2018-10-27 DIAGNOSIS — M62830 Muscle spasm of back: Secondary | ICD-10-CM | POA: Diagnosis not present

## 2018-10-27 DIAGNOSIS — Z85828 Personal history of other malignant neoplasm of skin: Secondary | ICD-10-CM | POA: Diagnosis not present

## 2018-10-27 LAB — URINALYSIS, MICROSCOPIC (REFLEX): Bacteria, UA: NONE SEEN

## 2018-10-27 LAB — COMPREHENSIVE METABOLIC PANEL
ALBUMIN: 4.2 g/dL (ref 3.5–5.0)
ALT: 31 U/L (ref 0–44)
AST: 27 U/L (ref 15–41)
Alkaline Phosphatase: 73 U/L (ref 38–126)
Anion gap: 7 (ref 5–15)
BUN: 12 mg/dL (ref 6–20)
CO2: 22 mmol/L (ref 22–32)
Calcium: 9.5 mg/dL (ref 8.9–10.3)
Chloride: 104 mmol/L (ref 98–111)
Creatinine, Ser: 0.81 mg/dL (ref 0.61–1.24)
GFR calc Af Amer: >60 mL/min (ref >60)
GFR calc non Af Amer: >60 mL/min (ref >60)
GLUCOSE: 123 mg/dL — AB (ref 70–99)
Potassium: 3.6 mmol/L (ref 3.5–5.1)
SODIUM: 133 mmol/L — AB (ref 135–145)
Total Bilirubin: 0.8 mg/dL (ref 0.3–1.2)
Total Protein: 7.5 g/dL (ref 6.5–8.1)

## 2018-10-27 LAB — URINALYSIS, ROUTINE W REFLEX MICROSCOPIC
BILIRUBIN URINE: NEGATIVE
Hgb urine dipstick: NEGATIVE
Ketones, ur: NEGATIVE mg/dL
Leukocytes,Ua: NEGATIVE
Nitrite: NEGATIVE
Protein, ur: NEGATIVE mg/dL
Specific Gravity, Urine: 1.01 (ref 1.005–1.030)
pH: 6.5 (ref 5.0–8.0)

## 2018-10-27 LAB — CBC WITH DIFFERENTIAL/PLATELET
Abs Immature Granulocytes: 0.01 10*3/uL (ref 0.00–0.07)
Basophils Absolute: 0.1 10*3/uL (ref 0.0–0.1)
Basophils Relative: 1 %
Eosinophils Absolute: 0.4 10*3/uL (ref 0.0–0.5)
Eosinophils Relative: 5 %
HCT: 48.2 % (ref 39.0–52.0)
Hemoglobin: 16.2 g/dL (ref 13.0–17.0)
Immature Granulocytes: 0 %
Lymphocytes Relative: 19 %
Lymphs Abs: 1.3 10*3/uL (ref 0.7–4.0)
MCH: 29.5 pg (ref 26.0–34.0)
MCHC: 33.6 g/dL (ref 30.0–36.0)
MCV: 87.6 fL (ref 80.0–100.0)
Monocytes Absolute: 0.6 10*3/uL (ref 0.1–1.0)
Monocytes Relative: 9 %
Neutro Abs: 4.4 10*3/uL (ref 1.7–7.7)
Neutrophils Relative %: 66 %
Platelets: 204 10*3/uL (ref 150–400)
RBC: 5.5 MIL/uL (ref 4.22–5.81)
RDW: 13.6 % (ref 11.5–15.5)
WBC: 6.7 10*3/uL (ref 4.0–10.5)
nRBC: 0 % (ref 0.0–0.2)

## 2018-10-27 LAB — POCT I-STAT EG7
ACID-BASE EXCESS: 1 mmol/L (ref 0.0–2.0)
Bicarbonate: 24.5 mmol/L (ref 20.0–28.0)
Calcium, Ion: 1.23 mmol/L (ref 1.15–1.40)
HCT: 48 % (ref 39.0–52.0)
Hemoglobin: 16.3 g/dL (ref 13.0–17.0)
O2 Saturation: 88 %
PO2 VEN: 49 mmHg — AB (ref 32.0–45.0)
Patient temperature: 97.6
Potassium: 4 mmol/L (ref 3.5–5.1)
Sodium: 138 mmol/L (ref 135–145)
TCO2: 26 mmol/L (ref 22–32)
pCO2, Ven: 33.8 mmHg — ABNORMAL LOW (ref 44.0–60.0)
pH, Ven: 7.465 — ABNORMAL HIGH (ref 7.250–7.430)

## 2018-10-27 LAB — LIPASE, BLOOD: Lipase: 20 U/L (ref 11–51)

## 2018-10-27 MED ORDER — PROCHLORPERAZINE EDISYLATE 10 MG/2ML IJ SOLN: 10.0000 mg | Freq: Once | INTRAMUSCULAR | Status: DC

## 2018-10-27 MED ORDER — PROCHLORPERAZINE EDISYLATE 10 MG/2ML IJ SOLN
10.0000 mg | Freq: Once | INTRAMUSCULAR | Status: AC
  Administered 2018-10-27: 10 mg via INTRAVENOUS
  Filled 2018-10-27: qty 2

## 2018-10-27 MED ORDER — KETOROLAC TROMETHAMINE 15 MG/ML IJ SOLN
15.0000 mg | Freq: Once | INTRAMUSCULAR | Status: AC
  Administered 2018-10-27: 15 mg via INTRAVENOUS
  Filled 2018-10-27: qty 1

## 2018-10-27 MED ORDER — CYCLOBENZAPRINE HCL 10 MG PO TABS: 10.0000 mg | ORAL_TABLET | Freq: Every day | ORAL | 0 refills | Status: AC

## 2018-10-27 MED ORDER — KETOROLAC TROMETHAMINE 15 MG/ML IJ SOLN: 15.0000 mg | Freq: Once | INTRAMUSCULAR | Status: DC

## 2018-10-27 MED ORDER — DROPERIDOL 2.5 MG/ML IJ SOLN: 2.5000 mg | Freq: Once | INTRAMUSCULAR | Status: DC

## 2018-10-27 NOTE — ED Triage Notes (Signed)
Pt took 1000mg  of tylenol around 0130

## 2018-10-27 NOTE — ED Notes (Signed)
Pt understood dc material. Script given at dc. NAD Noted. All questions answered to satisfaction. Pt and family escorted to check out window

## 2018-10-27 NOTE — ED Triage Notes (Signed)
Pt states he has had a bad headache since 0130 that woke him up. States he had diarrhea and nausea. Checked temp at home and it was normal. Pain has increased.

## 2018-10-27 NOTE — Discharge Instructions (Signed)
You may use over-the-counter Acetaminophen (Tylenol), topical muscle creams such as SalonPas, Icy Hot, Bengay, etc. Please stretch, apply heat, and have massage therapy for additional assistance.  

## 2018-10-27 NOTE — ED Provider Notes (Signed)
[facility] EMERGENCY DEPARTMENT Provider Note  CSN: [id number] Arrival date & time: 10/27/18 0453  Chief Complaint(s) Headache and Nausea  [name] is a 63 y.o. male   The history is provided by the patient.  Headache  Pain location:  L parietal and occipital Quality:  Stabbing Severity currently:  9/10 Severity at highest:  9/10 Onset quality:  Gradual Duration:  4 hours Timing:  Constant Progression:  Waxing and waning Chronicity:  New Similar to prior headaches: no   Relieved by:  Nothing Worsened by:  Neck movement Associated symptoms: nausea   Associated symptoms: no congestion, no cough, no fever and no vomiting     Past Medical History Past Medical History:  Diagnosis Date  . Colon polyps    adenomatous  . Diverticulosis 10/26/2013  . DM (diabetes mellitus), type 2 with neurological complications ([place]) 02/23/4579  . 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 History of chicken pox    childhood  . History of kidney stones   . Hyperlipidemia 07/26/2014  . Hypertension   . Insomnia 02/25/2013  . Preventative health care 07/26/2014  . Renal lithiasis 02/21/2015  . Sciatica of right side 12/22/2012  . Seasonal allergies   . 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 Trigger finger 05/03/2017   Patient Active Problem List   Diagnosis Date Noted  . Right inguinal hernia 08/26/2018  . Constipation 08/26/2018  . CAD (coronary artery disease) 05/13/2018  . Trigger finger, left middle finger 12/25/2017  . Muscle spasm 10/31/2017  . Elbow pain, chronic, right 08/13/2016  . Tendinitis of finger of left hand 07/31/2016  . Tendinitis of right triceps 07/31/2016  . 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 Kidney stone 02/21/2015  . Atypical chest pain 11/01/2014  . Hyperlipidemia, mixed 07/26/2014  . 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 Preventative health  care 07/26/2014  . BPH (benign prostatic hyperplasia) 12/11/2013  . Diverticulosis 10/26/2013  . Acute neck pain 06/04/2013  . Insomnia 02/25/2013  . Sciatica of right side 12/22/2012  . HTN (hypertension) 11/22/2012  . DM (diabetes mellitus), type 2 with neurological complications ([place]) 99/83/3825  . Lumbar radiculopathy 02/09/2012   Home Medication(s) Prior to Admission medications   Medication Sig Start Date End Date Taking? Authorizing Provider  ACCU-CHEK FASTCLIX LANCETS MISC Use lancets to check blood sugar 6 times daily. 08/29/18   Elayne Snare, MD  aspirin EC 81 MG tablet Take 1 tablet (81 mg total) by mouth daily. 10/26/14   Mosie Lukes, MD  atorvastatin (LIPITOR) 20 MG tablet Take 1 tablet (20 mg total) by mouth daily. 08/20/18   Elayne Snare, MD  canagliflozin (INVOKANA) 300 MG TABS tablet TAKE 1 TABLET BY MOUTH ONCE DAILY. 08/06/18   Elayne Snare, MD  cyclobenzaprine (FLEXERIL) 10 MG tablet Take 1 tablet (10 mg total) by mouth at bedtime for 10 days. 10/27/18 11/06/18  Fatima Blank, MD  fluticasone (FLONASE) 50 MCG/ACT nasal spray Place 2 sprays into both nostrils daily. Patient taking differently: Place 2 sprays into both nostrils daily as needed.  11/13/17   Saguier, Percell Miller, PA-C  glucose blood (ACCU-CHEK GUIDE) test strip Use as instructed to check blood sugar 6 times daily. 08/29/18   Elayne Snare, MD  HUMALOG KWIKPEN 100 UNIT/ML KwikPen INJECT 0.06-0.1 MLS (6-10 UNITS TOTAL) INTO THE SKIN 3 (THREE) TIMES DAILY BEFORE MEALS 10/10/18   Elayne Snare, MD  insulin degludec (TRESIBA  FLEXTOUCH) 100 UNIT/ML SOPN FlexTouch Pen Inject 0.28 mLs (28 Units total) into the skin daily. Inject 28 units under the skin daily. Patient taking differently: Inject 26 Units into the skin daily. Inject 26 units under the skin daily. 07/23/18   Elayne Snare, MD  Insulin Pen Needle (BD PEN NEEDLE NANO U/F) 32G X 4 MM MISC 1 each by Does not apply route daily. USE PEN NEEDLES TO INJECT INSULIN 5 TIMES  DAILY. DX:E11.65 08/20/18   Elayne Snare, MD  liraglutide (VICTOZA) 18 MG/3ML SOPN Inject 0.3 mLs (1.8 mg total) into the skin daily. 08/20/18   Elayne Snare, MD  loperamide (IMODIUM A-D) 2 MG tablet Take 2 mg by mouth 4 (four) times daily as needed for diarrhea or loose stools.    [provider]  MULTIPLE VITAMIN PO Take 1 packet by mouth daily.    [provider]  sodium chloride (OCEAN) 0.65 % SOLN nasal spray Place 1 spray into both nostrils as needed for congestion.    [provider]  traMADol (ULTRAM) 50 MG tablet Take 1 tablet (50 mg total) by mouth every 6 (six) hours as needed. 09/23/18 09/23/19  Ralene Ok, MD  zolpidem (AMBIEN) 10 MG tablet Take 1 tablet (10 mg total) by mouth at bedtime as needed for up to 30 days for sleep. 08/26/18 09/25/18  Mosie Lukes, MD                                                                                                                                    Past Surgical History Past Surgical History:  Procedure Laterality Date  . ABDOMINAL HERNIA REPAIR    . CHOLECYSTECTOMY    . HAND SURGERY    . HERNIA REPAIR    . INGUINAL HERNIA REPAIR Right 09/23/2018   Procedure: LAPAROSCOPIC RIGHT  INGUINAL HERNIA REPAIR WITH MESH;  Surgeon: Ralene Ok, MD;  Location: [facility];  Service: General;  Laterality: Right;  . LAPAROSCOPIC APPENDECTOMY N/A 08/16/2014   Procedure: APPENDECTOMY LAPAROSCOPIC;  Surgeon: Erroll Luna, MD;  Location: [facility];  Service: General;  Laterality: N/A;  . MASS EXCISION  05/22/2012   Procedure: EXCISION MASS;  Surgeon: Joyice Faster. Cornett, MD;  Location: [facility];  Service: General;  Laterality: Left;  excision abdominal wall mass  . ROTATOR CUFF REPAIR    . SHOULDER ARTHROSCOPY    . TRIGGER FINGER RELEASE Left 01/15/2018   Procedure: LEFT TRIGGER FINGER A-1 PULLEY RELEASE;  Surgeon: [name], MD;  Location: [facility];  Service: Orthopedics;  Laterality: Left;    Family History Family History  Problem Relation Age of Onset  . Lung cancer Mother   . Diabetes Maternal Aunt   . Cancer Paternal Grandmother   . Breast cancer Neg Hx   . Colon cancer Neg Hx   . Prostate cancer Neg Hx   . Heart disease Neg Hx  Social History Social History   Tobacco Use  . Smoking status: Never Smoker  . Smokeless tobacco: Never Used  Substance Use Topics  . Alcohol use: No  . Drug use: No   Allergies Contrast media [iodinated diagnostic agents]; Iodine; and Metformin and related  Review of Systems Review of Systems  Constitutional: Negative for fever.  HENT: Negative for congestion.   Respiratory: Negative for cough.   Gastrointestinal: Positive for nausea. Negative for vomiting.  Neurological: Positive for headaches.   All other systems are reviewed and are negative for acute change except as noted in the HPI  Physical Exam Vital Signs  I have reviewed the triage vital signs BP (!) 132/95   Pulse 87   Temp (!) 97.5 F (36.4 C) (Oral)   Resp 20   Ht 6' (1.829 m)   Wt 83.5 kg   SpO2 97%   BMI 24.95 kg/m   Physical Exam Vitals signs reviewed.  Constitutional:      General: He is not in acute distress.    Appearance: He is well-developed. He is not diaphoretic.  HENT:     Head: Normocephalic and atraumatic.     Jaw: No trismus.     Right Ear: External ear normal.     Left Ear: External ear normal.     Nose: Nose normal.  Eyes:     General: No scleral icterus.    Conjunctiva/sclera: Conjunctivae normal.  Neck:     Musculoskeletal: Normal range of motion. Muscular tenderness present.     Trachea: Phonation normal.   Cardiovascular:     Rate and Rhythm: Normal rate and regular rhythm.  Pulmonary:     Effort: Pulmonary effort is normal. No respiratory distress.     Breath sounds: No stridor.  Abdominal:     General: There is no distension.  Musculoskeletal: Normal range of motion.  Neurological:     Mental Status: He is alert  and oriented to person, place, and time.     Comments: Mental Status:  Alert and oriented to person, place, and time.  Attention and concentration normal.  Speech clear.  Recent memory is intact  Cranial Nerves:  II Visual Fields: Intact to confrontation. Visual fields intact. III, IV, VI: Pupils equal and reactive to light and near. Full eye movement without nystagmus  V Facial Sensation: Normal. No weakness of masticatory muscles  VII: No facial weakness or asymmetry  VIII Auditory Acuity: Grossly normal  IX/X: The uvula is midline; the palate elevates symmetrically  XI: Normal sternocleidomastoid and trapezius strength  XII: The tongue is midline. No atrophy or fasciculations.   Motor System: Muscle Strength: 5/5 and symmetric in the upper and lower extremities. No pronation or drift.  Muscle Tone: Tone and muscle bulk are normal in the upper and lower extremities.   Reflexes: DTRs: 1+ and symmetrical in all four extremities. No Clonus Coordination:  No tremor.  Sensation: Intact to light touch.t.  Gait: Routine gait normal.   Psychiatric:        Behavior: Behavior normal.     ED Results and Treatments Labs (all labs ordered are listed, but only abnormal results are displayed) Labs Reviewed  COMPREHENSIVE METABOLIC PANEL - Abnormal; Notable for the following components:      Result Value   Sodium 133 (*)    Glucose, Bld 123 (*)    All other components within normal limits  URINALYSIS, ROUTINE W REFLEX MICROSCOPIC - Abnormal; Notable for the following components:   Glucose,  UA >=500 (*)    All other components within normal limits  POCT I-STAT EG7 - Abnormal; Notable for the following components:   pH, Ven 7.465 (*)    pCO2, Ven 33.8 (*)    pO2, Ven 49.0 (*)    All other components within normal limits  CBC WITH DIFFERENTIAL/PLATELET  LIPASE, BLOOD  URINALYSIS, MICROSCOPIC (REFLEX)                                                                                                                          EKG  EKG Interpretation  Date/Time:    Ventricular Rate:    PR Interval:    QRS Duration:   QT Interval:    QTC Calculation:   R Axis:     Text Interpretation:        Radiology No results found. Pertinent labs & imaging results that were available during my care of the patient were reviewed by me and considered in my medical decision making (see chart for details).  Medications Ordered in ED Medications  prochlorperazine (COMPAZINE) injection 10 mg (10 mg Intravenous Given 10/27/18 0539)  ketorolac (TORADOL) 15 MG/ML injection 15 mg (15 mg Intravenous Given 10/27/18 0539)                                                                                                                                    Procedures Procedures  (including critical care time)  Medical Decision Making / ED Course I have reviewed the nursing notes for this encounter and the patient's prior records (if available in EHR or on provided paperwork).    Tension type headache, reproducible with palpation on spastic left upper back/should musculature. Non focal neuro exam. No recent head trauma. No fever. Doubt meningitis. Doubt intracranial bleed. Doubt IIH. No indication for imaging.   Will treat with migraine cocktail and reevaluate.  Significant improvement following medication.  Pt with h/o DM. Screening labs without significant electrolyte derangement.  No evidence of DKA.  The patient appears reasonably screened and/or stabilized for discharge and I doubt any other medical condition or other [facility] requiring further screening, evaluation, or treatment in the ED at this time prior to discharge.  The patient is safe for discharge with strict return precautions.   Final Clinical Impression(s) / ED Diagnoses Final diagnoses:  Bad headache  Muscle spasm of back   Disposition: Discharge  Condition: Good  I have discussed the results, Dx and Tx plan with the patient  who expressed understanding and agree(s) with the plan. Discharge instructions discussed at great length. The patient was given strict return precautions who verbalized understanding of the instructions. No further questions at time of discharge.    ED Discharge Orders         Ordered    cyclobenzaprine (FLEXERIL) 10 MG tablet  Daily at bedtime     10/27/18 9191           Follow Up: Mosie Lukes, [place] [address] [phone]   As needed      This chart was dictated using voice recognition software.  Despite best efforts to proofread,  errors can occur which can change the documentation meaning.   Fatima Blank, MD 10/27/18 [phone]

## 2018-10-27 NOTE — ED Notes (Signed)
ED Provider at bedside. 

## 2018-10-31 ENCOUNTER — Encounter: Payer: Self-pay | Admitting: Internal Medicine

## 2018-10-31 MED FILL — TRESIBA FLEXTOUCH 100 UNITS: 100 | 32 days supply | Qty: 9 | Fill #3

## 2018-11-04 MED FILL — UNIFINE PENTIPS 32GX5/32": 32G X 4 MM | 40 days supply | Qty: 200 | Fill #0

## 2018-11-04 MED FILL — INVOKANA 300 MG TABLET: 300 | 90 days supply | Qty: 90 | Fill #0

## 2018-11-04 MED FILL — VICTOZA 18 MG/3 ML INJECT P: 18 | 30 days supply | Qty: 9 | Fill #0

## 2018-11-04 MED FILL — ACCU-CHEK FASTCLIX LANCETS: 33 days supply | Qty: 204 | Fill #0

## 2018-11-04 MED FILL — UNIFINE PENTIPS 32GX5/32: 32G X 4 MM | 40 days supply | Qty: 200 | Fill #0

## 2018-11-04 MED FILL — ACCU-CHEK GUIDE TEST STRIP: 33 days supply | Qty: 200 | Fill #0

## 2018-11-04 MED FILL — HUMALOG 100 UNITS/ML KWIKPE: 100 | 30 days supply | Qty: 9 | Fill #0

## 2018-11-14 MED FILL — ATORVASTATIN 20 MG TABLET: 20 | 90 days supply | Qty: 90 | Fill #0

## 2018-11-18 ENCOUNTER — Other Ambulatory Visit: Payer: Self-pay

## 2018-11-18 ENCOUNTER — Other Ambulatory Visit (INDEPENDENT_AMBULATORY_CARE_PROVIDER_SITE_OTHER): Payer: No Typology Code available for payment source

## 2018-11-18 DIAGNOSIS — Z794 Long term (current) use of insulin: Secondary | ICD-10-CM

## 2018-11-18 DIAGNOSIS — E782 Mixed hyperlipidemia: Secondary | ICD-10-CM | POA: Diagnosis not present

## 2018-11-18 DIAGNOSIS — E1165 Type 2 diabetes mellitus with hyperglycemia: Secondary | ICD-10-CM

## 2018-11-18 LAB — COMPREHENSIVE METABOLIC PANEL
ALT: 33 U/L (ref 0–53)
AST: 26 U/L (ref 0–37)
Albumin: 4.6 g/dL (ref 3.5–5.2)
Alkaline Phosphatase: 86 U/L (ref 39–117)
BUN: 18 mg/dL (ref 6–23)
CO2: 25 mEq/L (ref 19–32)
Calcium: 10 mg/dL (ref 8.4–10.5)
Chloride: 99 mEq/L (ref 96–112)
Creatinine, Ser: 0.86 mg/dL (ref 0.40–1.50)
GFR: 90.55 mL/min (ref >60.00)
Glucose, Bld: 106 mg/dL — ABNORMAL HIGH (ref 70–99)
Potassium: 4.6 mEq/L (ref 3.5–5.1)
Sodium: 134 mEq/L — ABNORMAL LOW (ref 135–145)
Total Bilirubin: 0.4 mg/dL (ref 0.2–1.2)
Total Protein: 8 g/dL (ref 6.0–8.3)

## 2018-11-18 LAB — HEMOGLOBIN A1C: Hgb A1c MFr Bld: 6.4 % (ref 4.6–6.5)

## 2018-11-18 LAB — LIPID PANEL
Cholesterol: 150 mg/dL (ref 0–200)
HDL: 37.7 mg/dL — ABNORMAL LOW (ref >39.00)
LDL Cholesterol: 74 mg/dL (ref 0–99)
NonHDL: 112.29
Total CHOL/HDL Ratio: 4
Triglycerides: 191 mg/dL — ABNORMAL HIGH (ref 0.0–149.0)
VLDL: 38.2 mg/dL (ref 0.0–40.0)

## 2018-11-21 ENCOUNTER — Ambulatory Visit (INDEPENDENT_AMBULATORY_CARE_PROVIDER_SITE_OTHER): Payer: No Typology Code available for payment source | Admitting: Endocrinology

## 2018-11-21 ENCOUNTER — Encounter: Payer: Self-pay | Admitting: Endocrinology

## 2018-11-21 ENCOUNTER — Other Ambulatory Visit: Payer: Self-pay

## 2018-11-21 DIAGNOSIS — E782 Mixed hyperlipidemia: Secondary | ICD-10-CM | POA: Diagnosis not present

## 2018-11-21 DIAGNOSIS — E119 Type 2 diabetes mellitus without complications: Secondary | ICD-10-CM | POA: Diagnosis not present

## 2018-11-21 NOTE — Progress Notes (Signed)
[name], male   DOB: 1956/06/05, 63 y.o.   MRN: [id number]   Today's office visit was provided via telemedicine using video technique . Consent for the patient has been obtained . Location of the patient: Home . Location of the provider: Office Only the patient and myself were participating in the encounter    History of Present Illness:          Date of diagnosis of type 2 diabetes mellitus: 2010 ?        Background history:    He thinks his blood sugars at the time of diagnosis was around 500 and he was symptomatic with increased thirst and urination He has been taking metformin for several years but also has been tried on Amaryl Since about 2013 he has been on Januvia In early 2016 his blood sugars were significantly higher with A1c 11.9 and he was started on Lantus insulin, initially 10 units Janumet was continued Prior to consultation his A1c had gone up to 9%   Recent history:    Non-insulin hypoglycemic drugs the patient is taking are:  Invokana 300 mg daily in p.m.,  Victoza 1.8 mg daily   INSULIN regimen is: [place] 26 units daily, Humalog usually 7 units before meals   His A1c was  in 2019 as high as 11.1 and is now 6.4    Current management, blood sugar patterns and problems identified:  He was initially on basal insulin only and subsequently started on Humalog in 12/19  Although he was recommended adjusting his Humalog based on what he is eating he is taking 7 units with every meal  Checking blood sugars mostly fasting and some after dinner, blood sugars reviewed by his reading of the blood sugars  He does take Victoza and Invokana also but he now says that he takes Invokana at dinnertime and not in the morning  Recently has had some variable weight readings  Compared to the last visit his morning sugars are relatively lower and has readings below 70 twice including once last Sunday  With not checking blood sugars after  breakfast  Not clear if he is consistently controlled  However blood sugars after dinner a few days ago were mostly fairly good with 2 readings under 80  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   POST-MEAL PC Breakfast PC Lunch PC Dinner  Glucose range:    63-174  Mean/median:      PREVIOUS readings:  PRE-MEAL Fasting Lunch Dinner Bedtime Overall  Glucose range:  68-177    98-186      Mean/median:  105  112  122  103  111 +/-28      Self-care: The diet that the patient has been following is: tries to limit high-fat foods, carbohydrates .     Meal times are:  Breakfast is at 6 AM,  Lunch: 12-1 PM Dinner: 6-7 PM    Typical meal intake: Breakfast is usually cereal or oatmeal on weekdays at the cafeteria, usually taking lunch from home to work               Dietician visit, most recent: 11/2015               Exercise:   walking at work    Weight history:    Wt Readings from Last 3 Encounters:  10/27/18 184 lb (83.5 kg)  09/23/18 191 lb (86.6 kg)  09/17/18 191 lb 5.8 oz (86.8 kg)     Glycemic control:   Lab Results  Component Value Date   HGBA1C 6.4 11/18/2018   HGBA1C 7.4 (H) 09/17/2018   HGBA1C 11.1 (H) 07/19/2018   Lab Results  Component Value Date   MICROALBUR <0.7 08/16/2018   LDLCALC 74 11/18/2018   CREATININE 0.86 11/18/2018      Lab on 11/18/2018  Component Date Value Ref Range Status  . Cholesterol 11/18/2018 150  0 - 200 mg/dL Final   ATP III Classification       Desirable:  < 200 mg/dL               Borderline  High:  200 - 239 mg/dL          High:  > = 240 mg/dL  . Triglycerides 11/18/2018 191.0* 0.0 - 149.0 mg/dL Final   Normal:  <150 mg/dLBorderline High:  150 - 199 mg/dL  . HDL 11/18/2018 37.70* >39.00 mg/dL Final  . VLDL 11/18/2018 38.2  0.0 - 40.0 mg/dL Final  . LDL Cholesterol 11/18/2018 74  0 - 99 mg/dL Final  . Total CHOL/HDL Ratio 11/18/2018 4   Final                  Men          Women1/2 Average Risk     3.4          3.3Average Risk          5.0          4.42X Average Risk          9.6          7.13X Average Risk          15.0          11.0                      . NonHDL 11/18/2018 112.29   Final   NOTE:  Non-HDL goal should be 30 mg/dL higher than patient's LDL goal (i.e. LDL goal of < 70 mg/dL, would have non-HDL goal of < 100 mg/dL)  . Sodium 11/18/2018 134* 135 - 145 mEq/L Final  . Potassium 11/18/2018 4.6  3.5 - 5.1 mEq/L Final  . Chloride 11/18/2018 99  96 - 112 mEq/L Final  . CO2 11/18/2018 25  19 - 32 mEq/L Final  . Glucose, Bld 11/18/2018 106* 70 - 99 mg/dL Final  . BUN 11/18/2018 18  6 - 23 mg/dL Final  . Creatinine, Ser 11/18/2018 0.86  0.40 - 1.50 mg/dL Final  . Total Bilirubin 11/18/2018 0.4  0.2 - 1.2 mg/dL Final  . Alkaline Phosphatase 11/18/2018 86  39 - 117 U/L Final  . AST 11/18/2018 26  0 - 37 U/L Final  . ALT 11/18/2018 33  0 - 53 U/L Final  . Total Protein 11/18/2018 8.0  6.0 - 8.3  g/dL Final  . Albumin 11/18/2018 4.6  3.5 - 5.2 g/dL Final  . Calcium 11/18/2018 10.0  8.4 - 10.5 mg/dL Final  . GFR 11/18/2018 90.55  >60.00 mL/min Final  . Hgb A1c MFr Bld 11/18/2018 6.4  4.6 - 6.5 % Final   Glycemic Control Guidelines for People with Diabetes:Non Diabetic:  <6%Goal of Therapy: <7%Additional Action Suggested:  >8%         Allergies as of 11/21/2018      Reactions   Contrast Media [iodinated Diagnostic Agents] Swelling   Can take if premedicated with diphenhydramine   Iodine Swelling   Metformin And Related Diarrhea      Medication List       Accurate as of  November 21, 2018  2:04 PM. Always use your most recent med list.        Accu-Chek FastClix Lancets Misc Use lancets to check blood sugar 6 times daily.   aspirin EC 81 MG tablet Take 1 tablet (81 mg total) by mouth daily.   atorvastatin 20 MG tablet Commonly known as:  LIPITOR Take 1 tablet (20 mg total) by mouth daily.   canagliflozin 300 MG Tabs tablet Commonly known as:  Invokana TAKE 1 TABLET BY MOUTH ONCE DAILY.   fluticasone 50 MCG/ACT nasal spray Commonly known as:  FLONASE Place 2 sprays into both nostrils daily.   glucose blood test strip Commonly known as:  Accu-Chek Guide Use as instructed to check blood sugar 6 times daily.   HumaLOG KwikPen 100 UNIT/ML KwikPen Generic drug:  insulin lispro INJECT 0.06-0.1 MLS (6-10 UNITS TOTAL) INTO THE SKIN 3 (THREE) TIMES DAILY BEFORE MEALS   insulin degludec 100 UNIT/ML Sopn FlexTouch Pen Commonly known as:  Tyler Aas FlexTouch Inject 0.28 mLs (28 Units total) into the skin daily. Inject 28 units under the skin daily.   Insulin Pen Needle 32G X 4 MM Misc Commonly known as:  BD Pen Needle Nano U/F 1 each by Does not apply route daily. USE PEN NEEDLES TO INJECT INSULIN 5 TIMES DAILY. DX:E11.65   liraglutide 18 MG/3ML Sopn Commonly known as:  Victoza Inject 0.3 mLs (1.8 mg total) into the skin daily.   loperamide 2 MG tablet Commonly known as:  IMODIUM A-D Take 2 mg by mouth 4 (four) times daily as needed for diarrhea or loose stools.   MULTIPLE VITAMIN PO Take 1 packet by mouth daily.   sodium chloride 0.65 % Soln nasal spray Commonly known as:  OCEAN Place 1 spray into both nostrils as needed for congestion.   traMADol 50 MG tablet Commonly known as:  Ultram Take 1 tablet (50 mg total) by mouth every 6 (six) hours as needed.   zolpidem 10 MG tablet Commonly known as:  AMBIEN Take 1 tablet (10 mg total) by mouth at bedtime as needed for up to 30 days for sleep.       Past Medical History:  Diagnosis Date  .  Colon polyps    adenomatous  . Diverticulosis 10/26/2013  . DM (diabetes mellitus), type 2 with neurological complications ([place]) 04/06/2352  . 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 History of chicken pox    childhood  . History of kidney stones   . Hyperlipidemia 07/26/2014  . Hypertension   . Insomnia 02/25/2013  . Preventative health care 07/26/2014  . Renal lithiasis 02/21/2015  . Sciatica of right side 12/22/2012  . Seasonal allergies   . Squamous cell carcinoma in situ 2014   forehead, s/p  excision by derm  . Trigger finger 05/03/2017      Past Surgical History:  Procedure Laterality Date  . ABDOMINAL HERNIA REPAIR    . CHOLECYSTECTOMY    . HAND SURGERY    . HERNIA REPAIR    . INGUINAL HERNIA REPAIR Right 09/23/2018   Procedure: LAPAROSCOPIC RIGHT  INGUINAL HERNIA REPAIR WITH MESH;  Surgeon: Ralene Ok, MD;  Location: [facility];  Service: General;  Laterality: Right;  . LAPAROSCOPIC APPENDECTOMY N/A 08/16/2014   Procedure: APPENDECTOMY LAPAROSCOPIC;  Surgeon: Erroll Luna, MD;  Location: [facility];  Service: General;  Laterality: N/A;  . MASS EXCISION  05/22/2012   Procedure: EXCISION MASS;  Surgeon: Joyice Faster. Cornett, MD;  Location: [facility];  Service: General;  Laterality: Left;  excision abdominal wall mass  . ROTATOR CUFF REPAIR    . SHOULDER ARTHROSCOPY    . TRIGGER FINGER RELEASE Left 01/15/2018   Procedure: LEFT TRIGGER FINGER A-1 PULLEY RELEASE;  Surgeon: [name], MD;  Location: [facility];  Service: Orthopedics;  Laterality: Left;      Family History  Problem Relation Age of Onset  . Lung cancer Mother   . Diabetes Maternal Aunt   . Cancer Paternal Grandmother   . Breast cancer Neg Hx   . Colon cancer Neg Hx   . Prostate cancer Neg Hx   . Heart disease Neg Hx          Review of Systems    Lipid history: Had been on long-term treatment with statin, was on 40 mg Lipitor His prescription was  resumed and he is now taking 20 mg Lipitor with the following results  Lab Results  Component Value Date   CHOL 150 11/18/2018   CHOL 256 (H) 07/19/2018   CHOL 115 04/27/2017   Lab Results  Component Value Date   HDL 37.70 (L) 11/18/2018   HDL 36.20 (L) 07/19/2018   HDL 45.70 04/27/2017   Lab Results  Component Value Date   LDLCALC 74 11/18/2018   LDLCALC 52 04/27/2017   LDLCALC 42 10/03/2016   Lab Results  Component Value Date   TRIG 191.0 (H) 11/18/2018   TRIG (H) 07/19/2018    662.0 Triglyceride is over 400; calculations on Lipids are invalid.   TRIG 91.0 04/27/2017   Lab Results  Component Value Date   CHOLHDL 4 11/18/2018   CHOLHDL 7 07/19/2018   CHOLHDL 3 04/27/2017   Lab Results  Component Value Date   LDLDIRECT 146.0 07/19/2018   LDLDIRECT 109.0 10/26/2014   LDLDIRECT 116.4 07/17/2014             Hypertension: Blood pressure is usually well controlled     BP Readings from Last 3 Encounters:  10/27/18 131/87  09/23/18 (!) 144/84  09/17/18 133/83      Physical Examination:   Exam not done, this is a telehealth visit   ASSESSMENT:   Diabetes type 2,  with normal  BMI   See history of present illness for detailed discussion of current diabetes management, blood sugar patterns and problems identified   His A1c  is improved very significantly with using basal bolus insulin Baseline A1c of 11.1 is now down to 6.4   Although he has been very consistent with taking his insulin he is not monitoring enough readings after meals He does do well with his diet usually Also fairly active overall Currently having tendency to occasional hypoglycemia fasting and blood sugars are frequently below target  in the morning waking up He does not adjust his mealtime dose based on meal size and carbohydrate intake and with this he can occasionally get low after dinner    LIPIDS: Much better controlled  triglycerides and LDL with going back on Lipitor which he is  tolerating well and taking 20 mg     PLAN:   Reduce [place] down to 24 and may need to adjust 2 units further if blood sugars stay out of the range in the morning fasting Reduce Humalog down to 5 units when he is eating lower carbohydrate and smaller meals Discussed postprandial blood sugar targets of about 130-160 Continue regular exercise He will take his Invokana in the morning instead of dinnertime  Follow-up in 3 months  My chart message sent to the patient today:  Your A1c was 6.4% which is excellent  As discussed today we will reduce your Tyler Aas down to 24 units Blood sugar targets on waking up should be 80-120 with this adjustment. As discussed for low carbohydrate or small meals take only 5 units of Humalog Start taking Invokana just before breakfast instead of evening We will see her back in 3 months Continue atorvastatin indefinitely  Elayne Snare 11/21/18

## 2018-11-25 ENCOUNTER — Other Ambulatory Visit: Payer: Self-pay | Admitting: Endocrinology

## 2018-11-26 MED FILL — TRESIBA FLEXTOUCH 100 UNITS: 100 | 35 days supply | Qty: 9 | Fill #0

## 2018-11-28 MED FILL — VICTOZA 18 MG/3 ML INJECT P: 18 | 30 days supply | Qty: 9 | Fill #1

## 2018-11-28 MED FILL — HUMALOG 100 UNITS/ML KWIKPE: 100 | 20 days supply | Qty: 6 | Fill #1

## 2018-12-02 ENCOUNTER — Emergency Department (HOSPITAL_COMMUNITY)
Admission: EM | Admit: 2018-12-02 | Discharge: 2018-12-02 | Disposition: A | Discharge status: Home / Self Care | Payer: PRIVATE HEALTH INSURANCE | Attending: Emergency Medicine | Admitting: Emergency Medicine

## 2018-12-02 ENCOUNTER — Other Ambulatory Visit: Payer: Self-pay

## 2018-12-02 ENCOUNTER — Encounter (HOSPITAL_COMMUNITY): Payer: Self-pay | Admitting: Emergency Medicine

## 2018-12-02 DIAGNOSIS — Z79899 Other long term (current) drug therapy: Secondary | ICD-10-CM | POA: Insufficient documentation

## 2018-12-02 DIAGNOSIS — Y990 Civilian activity done for income or pay: Secondary | ICD-10-CM | POA: Insufficient documentation

## 2018-12-02 DIAGNOSIS — Z7982 Long term (current) use of aspirin: Secondary | ICD-10-CM | POA: Insufficient documentation

## 2018-12-02 DIAGNOSIS — I10 Essential (primary) hypertension: Secondary | ICD-10-CM | POA: Insufficient documentation

## 2018-12-02 DIAGNOSIS — Y92239 Unspecified place in hospital as the place of occurrence of the external cause: Secondary | ICD-10-CM | POA: Diagnosis not present

## 2018-12-02 DIAGNOSIS — S79911A Unspecified injury of right hip, initial encounter: Secondary | ICD-10-CM | POA: Diagnosis present

## 2018-12-02 DIAGNOSIS — S76011A Strain of muscle, fascia and tendon of right hip, initial encounter: Secondary | ICD-10-CM | POA: Diagnosis not present

## 2018-12-02 DIAGNOSIS — E119 Type 2 diabetes mellitus without complications: Secondary | ICD-10-CM | POA: Diagnosis not present

## 2018-12-02 DIAGNOSIS — X500XXA Overexertion from strenuous movement or load, initial encounter: Secondary | ICD-10-CM | POA: Insufficient documentation

## 2018-12-02 DIAGNOSIS — Y939 Activity, unspecified: Secondary | ICD-10-CM | POA: Insufficient documentation

## 2018-12-02 MED FILL — UNIFINE PENTIPS 32GX5/32": 32G X 4 MM | 40 days supply | Qty: 200 | Fill #1

## 2018-12-02 MED FILL — UNIFINE PENTIPS 32GX5/32: 32G X 4 MM | 40 days supply | Qty: 200 | Fill #1 | Status: TO

## 2018-12-02 NOTE — Discharge Instructions (Signed)
Ibuprofen 600 mg every 6 hours as needed for pain.  Rest.  Follow-up with your primary doctor if symptoms or not improving in the next week, and return to the ER if you develop a bulge in your groin, worsening pain, high fever, or bowel or bladder issues.

## 2018-12-02 NOTE — ED Provider Notes (Signed)
[facility] EMERGENCY DEPARTMENT Provider Note   CSN: [id number] Arrival date & time: 12/02/18  1140    History   Chief Complaint Chief Complaint  Patient presents with  . Groin Pain    [name] is a 63 y.o. male.     Patient is a 63 year old male with past medical history of diabetes, hypertension, and recent surgery to repair a right inguinal hernia.  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  He was assisting to lift a patient who had fallen on the floor when he felt a pull in his right groin.  He complains of pain to the anterior aspect of his right hip that is worse when he raises his leg and moves.  It is relieved with rest.  He denies any bowel complaints.  The history is provided by the patient.  Groin Pain  This is a new problem. The current episode started 1 to 2 hours ago. The problem occurs constantly. The problem has not changed since onset.Exacerbated by: Bending leg. Nothing relieves the symptoms. He has tried nothing for the symptoms.    Past Medical History:  Diagnosis Date  . Colon polyps    adenomatous  . Diverticulosis 10/26/2013  . DM (diabetes mellitus), type 2 with neurological complications ([place]) 05/04/1940  . 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 History of chicken pox    childhood  . History of kidney stones   . Hyperlipidemia 07/26/2014  . Hypertension   . Insomnia 02/25/2013  . Preventative health care 07/26/2014  . Renal lithiasis 02/21/2015  . Sciatica of right side 12/22/2012  . Seasonal allergies   . 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 Trigger finger 05/03/2017    Patient Active Problem List   Diagnosis Date Noted  . Right inguinal hernia 08/26/2018  . Constipation 08/26/2018  . CAD (coronary artery disease) 05/13/2018  . Trigger finger, left middle finger 12/25/2017  . Muscle spasm 10/31/2017  . Elbow pain, chronic, right  08/13/2016  . Tendinitis of finger of left hand 07/31/2016  . Tendinitis of right triceps 07/31/2016  . 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 Kidney stone 02/21/2015  . Atypical chest pain 11/01/2014  . Hyperlipidemia, mixed 07/26/2014  . 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 Preventative health care 07/26/2014  . BPH (benign prostatic hyperplasia) 12/11/2013  . Diverticulosis 10/26/2013  . Acute neck pain 06/04/2013  . Insomnia 02/25/2013  . Sciatica of right side 12/22/2012  . HTN (hypertension) 11/22/2012  . DM (diabetes mellitus), type 2 with neurological complications ([place]) 74/03/1447  . Lumbar radiculopathy 02/09/2012    Past Surgical History:  Procedure Laterality Date  . ABDOMINAL HERNIA REPAIR    . CHOLECYSTECTOMY    . HAND SURGERY    . HERNIA REPAIR    . INGUINAL HERNIA REPAIR Right 09/23/2018   Procedure: LAPAROSCOPIC RIGHT  INGUINAL HERNIA REPAIR WITH MESH;  Surgeon: Ralene Ok, MD;  Location: [facility];  Service: General;  Laterality: Right;  . LAPAROSCOPIC APPENDECTOMY N/A 08/16/2014   Procedure: APPENDECTOMY LAPAROSCOPIC;  Surgeon: Erroll Luna, MD;  Location: [facility];  Service: General;  Laterality: N/A;  . MASS EXCISION  05/22/2012   Procedure: EXCISION MASS;  Surgeon: Joyice Faster. Cornett, MD;  Location: [facility];  Service: General;  Laterality: Left;  excision abdominal wall mass  . ROTATOR CUFF REPAIR    . SHOULDER  ARTHROSCOPY    . TRIGGER FINGER RELEASE Left 01/15/2018   Procedure: LEFT TRIGGER FINGER A-1 PULLEY RELEASE;  Surgeon: [name], MD;  Location: [facility];  Service: Orthopedics;  Laterality: Left;        Home Medications    Prior to Admission medications   Medication Sig Start Date End Date Taking? Authorizing Provider  ACCU-CHEK FASTCLIX LANCETS MISC Use lancets to check blood sugar 6 times daily. 08/29/18   Elayne Snare, MD  aspirin EC 81 MG tablet Take 1 tablet  (81 mg total) by mouth daily. 10/26/14   Mosie Lukes, MD  atorvastatin (LIPITOR) 20 MG tablet Take 1 tablet (20 mg total) by mouth daily. 08/20/18   Elayne Snare, MD  canagliflozin (INVOKANA) 300 MG TABS tablet TAKE 1 TABLET BY MOUTH ONCE DAILY. 08/06/18   Elayne Snare, MD  fluticasone (FLONASE) 50 MCG/ACT nasal spray Place 2 sprays into both nostrils daily. Patient taking differently: Place 2 sprays into both nostrils daily as needed.  11/13/17   Saguier, Percell Miller, PA-C  glucose blood (ACCU-CHEK GUIDE) test strip Use as instructed to check blood sugar 6 times daily. 08/29/18   Elayne Snare, MD  HUMALOG KWIKPEN 100 UNIT/ML KwikPen INJECT 0.06-0.1 MLS (6-10 UNITS TOTAL) INTO THE SKIN 3 (THREE) TIMES DAILY BEFORE MEALS 10/10/18   Elayne Snare, MD  insulin degludec (TRESIBA FLEXTOUCH) 100 UNIT/ML SOPN FlexTouch Pen Inject 0.26 mLs (26 Units total) into the skin daily. Inject 26 units under the skin daily. 11/26/18   Elayne Snare, MD  Insulin Pen Needle (BD PEN NEEDLE NANO U/F) 32G X 4 MM MISC 1 each by Does not apply route daily. USE PEN NEEDLES TO INJECT INSULIN 5 TIMES DAILY. DX:E11.65 08/20/18   Elayne Snare, MD  liraglutide (VICTOZA) 18 MG/3ML SOPN Inject 0.3 mLs (1.8 mg total) into the skin daily. 08/20/18   Elayne Snare, MD  loperamide (IMODIUM A-D) 2 MG tablet Take 2 mg by mouth 4 (four) times daily as needed for diarrhea or loose stools.    [provider]  MULTIPLE VITAMIN PO Take 1 packet by mouth daily.    [provider]  sodium chloride (OCEAN) 0.65 % SOLN nasal spray Place 1 spray into both nostrils as needed for congestion.    [provider]  traMADol (ULTRAM) 50 MG tablet Take 1 tablet (50 mg total) by mouth every 6 (six) hours as needed. 09/23/18 09/23/19  Ralene Ok, MD  zolpidem (AMBIEN) 10 MG tablet Take 1 tablet (10 mg total) by mouth at bedtime as needed for up to 30 days for sleep. 08/26/18 09/25/18  Mosie Lukes, MD    Family History Family History  Problem  Relation Age of Onset  . Lung cancer Mother   . Diabetes Maternal Aunt   . Cancer Paternal Grandmother   . Breast cancer Neg Hx   . Colon cancer Neg Hx   . Prostate cancer Neg Hx   . Heart disease Neg Hx     Social History Social History   Tobacco Use  . Smoking status: Never Smoker  . Smokeless tobacco: Never Used  Substance Use Topics  . Alcohol use: No  . Drug use: No     Allergies   Contrast media [iodinated diagnostic agents]; Iodine; and Metformin and related   Review of Systems Review of Systems  All other systems reviewed and are negative.    Physical Exam Updated Vital Signs BP (!) 141/85 (BP Location: Right Arm)   Pulse  86   Temp 97.9 F (36.6 C) (Oral)   Resp 16   Ht 6' (1.829 m)   Wt 83.5 kg   SpO2 98%   BMI 24.95 kg/m   Physical Exam Vitals signs and nursing note reviewed.  Constitutional:      General: He is not in acute distress.    Appearance: Normal appearance. He is not ill-appearing.  HENT:     Head: Normocephalic and atraumatic.  Pulmonary:     Effort: Pulmonary effort is normal.  Abdominal:     General: There is no distension.     Tenderness: There is no abdominal tenderness.     Comments: There are no palpable hernias.  The incision site appears well.  There is no tenderness in the inguinal region.  Musculoskeletal:     Comments: There is tenderness to palpation over the anterior aspect of the right hip.  He has pain with flexion of the hip and rotation of the hip.  Distal PMS is intact.  Skin:    General: Skin is warm and dry.  Neurological:     General: No focal deficit present.     Mental Status: He is alert.      ED Treatments / Results  Labs (all labs ordered are listed, but only abnormal results are displayed) Labs Reviewed - No data to display  EKG None  Radiology No results found.  Procedures Procedures (including critical care time)  Medications Ordered in ED Medications - No data to display    Initial Impression / Assessment and Plan / ED Course  I have reviewed the triage vital signs and the nursing notes.  Pertinent labs & imaging results that were available during my care of the patient were reviewed by me and considered in my medical decision making (see chart for details).  This appears to be musculoskeletal in nature.  I highly doubt anything related to the hernia repair.  In discussion with the patient, we have both agreed that foregoing imaging is appropriate.  This will be treated as a musculoskeletal injury with anti-inflammatories and rest.  He is to return as needed if he develops any bowel complaints or symptoms worsen or change.  Final Clinical Impressions(s) / ED Diagnoses   Final diagnoses:  None    ED Discharge Orders    None       Veryl Speak, MD 12/02/18 1218

## 2018-12-02 NOTE — ED Triage Notes (Signed)
Pt is a staff member on security team here. States helped pick up a paralyzed patient from the ground, squatted down. States recent groin hernia surgery in Feb. On affected side. Pt ambulates with limp.

## 2018-12-10 ENCOUNTER — Ambulatory Visit: Payer: Self-pay | Admitting: Family Medicine

## 2018-12-10 ENCOUNTER — Other Ambulatory Visit: Payer: Self-pay

## 2018-12-10 ENCOUNTER — Ambulatory Visit (INDEPENDENT_AMBULATORY_CARE_PROVIDER_SITE_OTHER): Payer: No Typology Code available for payment source | Admitting: Family Medicine

## 2018-12-10 DIAGNOSIS — I10 Essential (primary) hypertension: Secondary | ICD-10-CM

## 2018-12-10 DIAGNOSIS — E782 Mixed hyperlipidemia: Secondary | ICD-10-CM

## 2018-12-10 DIAGNOSIS — E1149 Type 2 diabetes mellitus with other diabetic neurological complication: Secondary | ICD-10-CM

## 2018-12-10 DIAGNOSIS — G4700 Insomnia, unspecified: Secondary | ICD-10-CM

## 2018-12-10 DIAGNOSIS — M542 Cervicalgia: Secondary | ICD-10-CM

## 2018-12-10 MED ORDER — ZOLPIDEM TARTRATE 10 MG PO TABS: 10.0000 mg | ORAL_TABLET | Freq: Every evening | ORAL | 5 refills | Status: AC | PRN

## 2018-12-10 MED FILL — ZOLPIDEM TARTRATE 10 MG TAB: 10 | 15 days supply | Qty: 15 | Fill #0

## 2018-12-10 NOTE — Assessment & Plan Note (Signed)
Was seen in ED but is doing better and not taking any significant medications

## 2018-12-10 NOTE — Progress Notes (Signed)
Virtual Visit via Video Note  I connected with Albert [name] on 12/10/18 at  9:40 AM EDT by a video enabled telemedicine application and verified that I am speaking with the correct person using two identifiers.   I discussed the limitations of evaluation and management by telemedicine and the availability of in person appointments. The patient expressed understanding and agreed to proceed. I discussed the assessment and treatment plan with the patient. The patient was provided an opportunity to ask questions and all were answered. The patient agreed with the plan and demonstrated an understanding of the instructions. Albert [name], CMA was able to get patient set up on video visit platform.     Subjective:    [name], male    DOB: 07-Mar-1956, 63 y.o.   MRN: [id number]  No chief complaint on file.   HPI Patient is in today for follow up on chronic medical concerns including diabetes, hyperlipidemia, CAD and pain. He has been seen in ED twice since last visit with neck pain and hip pain. He feels well today. No recent febrile illness or hospitalizations. Denies CP/palp/SOB/HA/congestion/fevers/GI or GU c/o. Taking meds as prescribed  Past Medical History:  Diagnosis Date  . Colon polyps    adenomatous  . Diverticulosis 10/26/2013  . DM (diabetes mellitus), type 2 with neurological complications ([place]) 03/08/2034  . 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 History of chicken pox    childhood  . History of kidney stones   . Hyperlipidemia 07/26/2014  . Hypertension   . Insomnia 02/25/2013  . Preventative health care 07/26/2014  . Renal lithiasis 02/21/2015  . Sciatica of right side 12/22/2012  . Seasonal allergies   . 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 Trigger finger 05/03/2017    Past Surgical History:  Procedure Laterality Date  . ABDOMINAL HERNIA REPAIR    . CHOLECYSTECTOMY    . HAND SURGERY    .  HERNIA REPAIR    . INGUINAL HERNIA REPAIR Right 09/23/2018   Procedure: LAPAROSCOPIC RIGHT  INGUINAL HERNIA REPAIR WITH MESH;  Surgeon: Ralene Ok, MD;  Location: [facility];  Service: General;  Laterality: Right;  . LAPAROSCOPIC APPENDECTOMY N/A 08/16/2014   Procedure: APPENDECTOMY LAPAROSCOPIC;  Surgeon: Erroll Luna, MD;  Location: [facility];  Service: General;  Laterality: N/A;  . MASS EXCISION  05/22/2012   Procedure: EXCISION MASS;  Surgeon: Joyice Faster. Cornett, MD;  Location: [facility];  Service: General;  Laterality: Left;  excision abdominal wall mass  . ROTATOR CUFF REPAIR    . SHOULDER ARTHROSCOPY    . TRIGGER FINGER RELEASE Left 01/15/2018   Procedure: LEFT TRIGGER FINGER A-1 PULLEY RELEASE;  Surgeon: [name], MD;  Location: [facility];  Service: Orthopedics;  Laterality: Left;    Family History  Problem Relation Age of Onset  . Lung cancer Mother   . Diabetes Maternal Aunt   . Cancer Paternal Grandmother   . Breast cancer Neg Hx   . Colon cancer Neg Hx   . Prostate cancer Neg Hx   . Heart disease Neg Hx     Social History   Socioeconomic History  . Marital status: Married    Spouse name: Not on file  . Number of children: 3  . Years of education: Not on file  . Highest education level: Not on file  Occupational History    Comment: Animal nutritionist [employer]  Social  Needs  . Financial resource strain: Not on file  . Food insecurity:    Worry: Not on file    Inability: Not on file  . Transportation needs:    Medical: Not on file    Non-medical: Not on file  Tobacco Use  . Smoking status: Never Smoker  . Smokeless tobacco: Never Used  Substance and Sexual Activity  . Alcohol use: No  . Drug use: No  . Sexual activity: Yes  Lifestyle  . Physical activity:    Days per week: Not on file    Minutes per session: Not on file  . Stress: Not on file  Relationships  . Social connections:    Talks on phone: Not on file    Gets  together: Not on file    Attends religious service: Not on file    Active member of club or organization: Not on file    Attends meetings of clubs or organizations: Not on file    Relationship status: Not on file  . Intimate partner violence:    Fear of current or ex partner: Not on file    Emotionally abused: Not on file    Physically abused: Not on file    Forced sexual activity: Not on file  Other Topics Concern  . Not on file  Social History Narrative  . Not on file    Outpatient Medications Prior to Visit  Medication Sig Dispense Refill  . ACCU-CHEK FASTCLIX LANCETS MISC Use lancets to check blood sugar 6 times daily. 200 each 12  . aspirin EC 81 MG tablet Take 1 tablet (81 mg total) by mouth daily.    Marland Kitchen atorvastatin (LIPITOR) 20 MG tablet Take 1 tablet (20 mg total) by mouth daily. 90 tablet 3  . canagliflozin (INVOKANA) 300 MG TABS tablet TAKE 1 TABLET BY MOUTH ONCE DAILY. 90 tablet 3  . fluticasone (FLONASE) 50 MCG/ACT nasal spray Place 2 sprays into both nostrils daily. (Patient taking differently: Place 2 sprays into both nostrils daily as needed. ) 16 g 1  . glucose blood (ACCU-CHEK GUIDE) test strip Use as instructed to check blood sugar 6 times daily. 200 each 12  . HUMALOG KWIKPEN 100 UNIT/ML KwikPen INJECT 0.06-0.1 MLS (6-10 UNITS TOTAL) INTO THE SKIN 3 (THREE) TIMES DAILY BEFORE MEALS 6 mL 3  . insulin degludec (TRESIBA FLEXTOUCH) 100 UNIT/ML SOPN FlexTouch Pen Inject 0.26 mLs (26 Units total) into the skin daily. Inject 26 units under the skin daily. 10 mL 3  . Insulin Pen Needle (BD PEN NEEDLE NANO U/F) 32G X 4 MM MISC 1 each by Does not apply route daily. USE PEN NEEDLES TO INJECT INSULIN 5 TIMES DAILY. DX:E11.65 200 each 11  . liraglutide (VICTOZA) 18 MG/3ML SOPN Inject 0.3 mLs (1.8 mg total) into the skin daily. 9 mL 3  . loperamide (IMODIUM A-D) 2 MG tablet Take 2 mg by mouth 4 (four) times daily as needed for diarrhea or loose stools.    . MULTIPLE VITAMIN PO Take  1 packet by mouth daily.    . sodium chloride (OCEAN) 0.65 % SOLN nasal spray Place 1 spray into both nostrils as needed for congestion.    . traMADol (ULTRAM) 50 MG tablet Take 1 tablet (50 mg total) by mouth every 6 (six) hours as needed. 20 tablet 0  . zolpidem (AMBIEN) 10 MG tablet Take 1 tablet (10 mg total) by mouth at bedtime as needed for up to 30 days for sleep. 15 tablet 1  No facility-administered medications prior to visit.     Allergies  Allergen Reactions  . Contrast Media [Iodinated Diagnostic Agents] Swelling    Can take if premedicated with diphenhydramine  . Iodine Swelling  . Metformin And Related Diarrhea    Review of Systems  Constitutional: Negative for fever and malaise/fatigue.  HENT: Negative for congestion.   Eyes: Negative for blurred vision.  Respiratory: Negative for shortness of breath.   Cardiovascular: Negative for chest pain, palpitations and leg swelling.  Gastrointestinal: Negative for abdominal pain, blood in stool and nausea.  Genitourinary: Negative for dysuria and frequency.  Musculoskeletal: Negative for falls.  Skin: Negative for rash.  Neurological: Negative for dizziness, loss of consciousness and headaches.  Endo/Heme/Allergies: Negative for environmental allergies.  Psychiatric/Behavioral: Negative for depression. The patient has insomnia. The patient is not nervous/anxious.        Objective:    Physical Exam Vitals signs and nursing note reviewed.  Constitutional:      General: He is not in acute distress.    Appearance: He is well-developed. He is not ill-appearing.  HENT:     Head: Normocephalic and atraumatic.     Nose: Nose normal.  Eyes:     General:        Right eye: No discharge.        Left eye: No discharge.  Neck:     Musculoskeletal: Neck supple.  Cardiovascular:     Heart sounds: No murmur.  Pulmonary:     Effort: Pulmonary effort is normal.  Abdominal:     Palpations: Abdomen is soft.     Tenderness:  There is no abdominal tenderness.  Skin:    General: Skin is dry.  Neurological:     Mental Status: He is alert and oriented to person, place, and time.  Psychiatric:        Mood and Affect: Mood normal.        Behavior: Behavior normal.     BP 122/71 (BP Location: Left Arm, Patient Position: Sitting, Cuff Size: Normal)   Temp 98.6 F (37 C) (Oral)   Wt 184 lb (83.5 kg)   BMI 24.95 kg/m  Wt Readings from Last 3 Encounters:  12/10/18 184 lb (83.5 kg)  12/02/18 184 lb (83.5 kg)  10/27/18 184 lb (83.5 kg)    Diabetic Foot Exam - Simple   No data filed     Lab Results  Component Value Date   WBC 6.7 10/27/2018   HGB 16.3 10/27/2018   HCT 48.0 10/27/2018   PLT 204 10/27/2018   GLUCOSE 106 (H) 11/18/2018   CHOL 150 11/18/2018   TRIG 191.0 (H) 11/18/2018   HDL 37.70 (L) 11/18/2018   LDLDIRECT 146.0 07/19/2018   LDLCALC 74 11/18/2018   ALT 33 11/18/2018   AST 26 11/18/2018   NA 134 (L) 11/18/2018   K 4.6 11/18/2018   CL 99 11/18/2018   CREATININE 0.86 11/18/2018   BUN 18 11/18/2018   CO2 25 11/18/2018   TSH 1.40 08/19/2018   PSA 1.07 04/24/2018   INR 1.10 08/16/2014   HGBA1C 6.4 11/18/2018   MICROALBUR <0.7 08/16/2018    Lab Results  Component Value Date   TSH 1.40 08/19/2018   Lab Results  Component Value Date   WBC 6.7 10/27/2018   HGB 16.3 10/27/2018   HCT 48.0 10/27/2018   MCV 87.6 10/27/2018   PLT 204 10/27/2018   Lab Results  Component Value Date   NA 134 (L) 11/18/2018  K 4.6 11/18/2018   CO2 25 11/18/2018   GLUCOSE 106 (H) 11/18/2018   BUN 18 11/18/2018   CREATININE 0.86 11/18/2018   BILITOT 0.4 11/18/2018   ALKPHOS 86 11/18/2018   AST 26 11/18/2018   ALT 33 11/18/2018   PROT 8.0 11/18/2018   ALBUMIN 4.6 11/18/2018   CALCIUM 10.0 11/18/2018   ANIONGAP 7 10/27/2018   GFR 90.55 11/18/2018   Lab Results  Component Value Date   CHOL 150 11/18/2018   Lab Results  Component Value Date   HDL 37.70 (L) 11/18/2018   Lab Results   Component Value Date   LDLCALC 74 11/18/2018   Lab Results  Component Value Date   TRIG 191.0 (H) 11/18/2018   Lab Results  Component Value Date   CHOLHDL 4 11/18/2018   Lab Results  Component Value Date   HGBA1C 6.4 11/18/2018       Assessment & Plan:   Problem List Items Addressed This Visit    DM (diabetes mellitus), type 2 with neurological complications ([place])    [id number] acceptable, minimize simple carbs. Increase exercise as tolerated. Continue current meds. Following with endocrinology and doing well      HTN (hypertension)    Well controlled, no changes to meds. Encouraged heart healthy diet such as the DASH diet and exercise as tolerated.       Insomnia   Relevant Medications   zolpidem (AMBIEN) 10 MG tablet   Acute neck pain    Was seen in ED but is doing better and not taking any significant medications       Hyperlipidemia, mixed    Tolerating statin, encouraged heart healthy diet, avoid trans fats, minimize simple carbs and saturated fats. Increase exercise as tolerated         I am having Davis Ambrosini. Coviello maintain his loperamide, aspirin EC, sodium chloride, MULTIPLE VITAMIN PO, fluticasone, canagliflozin, atorvastatin, liraglutide, Insulin Pen Needle, glucose blood, Accu-Chek FastClix Lancets, traMADol, HumaLOG KwikPen, insulin degludec, and zolpidem.  Meds ordered this encounter  Medications  . zolpidem (AMBIEN) 10 MG tablet    Sig: Take 1 tablet (10 mg total) by mouth at bedtime as needed for up to 30 days for sleep.    Dispense:  15 tablet    Refill:  5    The patient was advised to call back or seek an in-person evaluation if the symptoms worsen or if the condition fails to improve as anticipated.  I provided 25 minutes of non-face-to-face time during this encounter.   [name], MD

## 2018-12-10 NOTE — Assessment & Plan Note (Signed)
hgba1c acceptable, minimize simple carbs. Increase exercise as tolerated. Continue current meds. Following with endocrinology and doing well

## 2018-12-10 NOTE — Assessment & Plan Note (Signed)
Well controlled, no changes to meds. Encouraged heart healthy diet such as the DASH diet and exercise as tolerated.  °

## 2018-12-10 NOTE — Assessment & Plan Note (Signed)
Tolerating statin, encouraged heart healthy diet, avoid trans fats, minimize simple carbs and saturated fats. Increase exercise as tolerated 

## 2018-12-24 ENCOUNTER — Other Ambulatory Visit: Payer: Self-pay | Admitting: Endocrinology

## 2018-12-24 MED FILL — HUMALOG 100 UNITS/ML KWIKPE: 100 | 20 days supply | Qty: 6 | Fill #0 | Status: TO

## 2018-12-24 MED FILL — ZOLPIDEM TARTRATE 10 MG TAB: 10 | 15 days supply | Qty: 15 | Fill #1 | Status: TO

## 2018-12-26 ENCOUNTER — Other Ambulatory Visit: Payer: Self-pay | Admitting: Endocrinology

## 2018-12-26 MED FILL — TRESIBA FLEXTOUCH 100 UNITS: 100 | 35 days supply | Qty: 9 | Fill #1

## 2018-12-26 MED FILL — VICTOZA 18 MG/3 ML INJECT P: 18 | 30 days supply | Qty: 9 | Fill #0

## 2019-01-10 MED FILL — BD PEN NDL NANO 32GX5/32: 32G X 4 MM | 40 days supply | Qty: 200 | Fill #0

## 2019-01-10 MED FILL — BD PEN NDL NANO 32GX5/32": 32G X 4 MM | 40 days supply | Qty: 200 | Fill #0

## 2019-01-10 MED FILL — HUMALOG 100 UNITS/ML KWIKPE: 100 | 20 days supply | Qty: 6 | Fill #0

## 2019-01-10 MED FILL — ZOLPIDEM TARTRATE 10 MG TAB: 10 | 15 days supply | Qty: 15 | Fill #0

## 2019-01-24 MED FILL — ZOLPIDEM TARTRATE 10 MG TAB: 10 | 15 days supply | Qty: 15 | Fill #1

## 2019-01-29 MED FILL — TRESIBA FLEXTOUCH 100 UNITS: 100 | 35 days supply | Qty: 9 | Fill #0

## 2019-02-03 MED FILL — INVOKANA 300 MG TABLET: 300 | 90 days supply | Qty: 90 | Fill #0

## 2019-02-03 MED FILL — VICTOZA 18 MG/3 ML INJECT P: 18 | 30 days supply | Qty: 9 | Fill #0

## 2019-02-06 ENCOUNTER — Other Ambulatory Visit: Payer: Self-pay | Admitting: Endocrinology

## 2019-02-06 MED FILL — HUMALOG 100 UNITS/ML KWIKPE: 100 | 20 days supply | Qty: 6 | Fill #0

## 2019-02-06 MED FILL — ZOLPIDEM TARTRATE 10 MG TAB: 10 | 15 days supply | Qty: 15 | Fill #2

## 2019-02-17 ENCOUNTER — Other Ambulatory Visit: Payer: Self-pay

## 2019-02-17 ENCOUNTER — Other Ambulatory Visit (INDEPENDENT_AMBULATORY_CARE_PROVIDER_SITE_OTHER): Payer: No Typology Code available for payment source

## 2019-02-17 DIAGNOSIS — E119 Type 2 diabetes mellitus without complications: Secondary | ICD-10-CM | POA: Diagnosis not present

## 2019-02-17 LAB — BASIC METABOLIC PANEL
BUN: 17 mg/dL (ref 6–23)
CO2: 24 mEq/L (ref 19–32)
Calcium: 9.4 mg/dL (ref 8.4–10.5)
Chloride: 102 mEq/L (ref 96–112)
Creatinine, Ser: 0.96 mg/dL (ref 0.40–1.50)
GFR: 79.69 mL/min (ref >60.00)
Glucose, Bld: 99 mg/dL (ref 70–99)
Potassium: 4.5 mEq/L (ref 3.5–5.1)
Sodium: 134 mEq/L — ABNORMAL LOW (ref 135–145)

## 2019-02-17 LAB — HEMOGLOBIN A1C: Hgb A1c MFr Bld: 6.8 % — ABNORMAL HIGH (ref 4.6–6.5)

## 2019-02-20 ENCOUNTER — Encounter: Payer: Self-pay | Admitting: Endocrinology

## 2019-02-20 ENCOUNTER — Ambulatory Visit (INDEPENDENT_AMBULATORY_CARE_PROVIDER_SITE_OTHER): Payer: No Typology Code available for payment source | Admitting: Endocrinology

## 2019-02-20 ENCOUNTER — Other Ambulatory Visit: Payer: Self-pay

## 2019-02-20 DIAGNOSIS — E1165 Type 2 diabetes mellitus with hyperglycemia: Secondary | ICD-10-CM

## 2019-02-20 DIAGNOSIS — Z794 Long term (current) use of insulin: Secondary | ICD-10-CM

## 2019-02-20 MED FILL — ATORVASTATIN 20 MG TABLET: 20 | 90 days supply | Qty: 90 | Fill #0

## 2019-02-20 MED FILL — ZOLPIDEM TARTRATE 10 MG TAB: 10 | 15 days supply | Qty: 15 | Fill #3

## 2019-02-20 NOTE — Progress Notes (Signed)
[name], male   DOB: July 20, 1956, 63 y.o.   MRN: [id number]   Today's office visit was provided via telemedicine using video technique . Consent for the patient has been obtained . Location of the patient: Home . Location of the provider: Office Only the patient and myself were participating in the encounter    History of Present Illness:          Date of diagnosis of type 2 diabetes mellitus: 2010 ?        Background history:    He thinks his blood sugars at the time of diagnosis was around 500 and he was symptomatic with increased thirst and urination He has been taking metformin for several years but also has been tried on Amaryl Since about 2013 he has been on Januvia In early 2016 his blood sugars were significantly higher with A1c 11.9 and he was started on Lantus insulin, initially 10 units Janumet was continued Prior to consultation his A1c had gone up to 9%   He was initially on basal insulin only and subsequently started on Humalog in 12/19  Recent history:    Non-insulin hypoglycemic drugs the patient is taking are:  Invokana 300 mg daily in p.m.,  Victoza 1.8 mg daily   INSULIN regimen is: [place] 25 units daily, Humalog usually 7 units before meals    Current management, blood sugar patterns and problems identified:  His A1c is now 6.8 compared to 6.4  Blood sugars were relatively low in the mornings on the last visit and his Tyler Aas was recommended to be reduced at least to 24 units but he is using 40  Again he somehow does not like to check his blood sugars fasting and only checks them on after breakfast between 6-7 AM  He does not think his sugars are low waking up and they are about 100  Recent blood sugars after dinner are around 160 fairly consistently  He thinks he is walking 50,000 steps during the day especially at work  Also doing some running when he is not working  Weight is about the same  Although he is  taking Humalog with every meal he is not adjusting the dose based on what he is eating  Not clear if his blood sugars are high after lunch  He thinks he gets little nodules at the site of his insulin injections in his stomach and has not rotated the injection sites  He is asking about reducing the number of injections         Side effects from medications have been: Diarrhea from high dose metformin     Glucose monitoring:  done 3.2 times a day         Glucometer:  Accu-Chek Blood Glucose readings by review of monitor on phone     PRE-MEAL Fasting Lunch Dinner Bedtime Overall  Glucose range:  100    152   Mean/median:        POST-MEAL PC Breakfast PC Lunch PC Dinner  Glucose range:  101-139   158-162  Mean/median:      Previous readings:  PRE-MEAL Fasting Lunch Dinner Bedtime Overall  Glucose range:  57-157  79     Mean/median:     ?   POST-MEAL PC Breakfast PC Lunch PC Dinner  Glucose range:    63-174  Mean/median:        Self-care: The diet that the patient has been following is: tries to  limit high-fat foods, carbohydrates .     Meal times are:  Breakfast is at 6 AM, Lunch: 12-1 PM Dinner: 6-7 PM    Typical meal intake: Breakfast is usually cereal or oatmeal on weekdays at the cafeteria, usually taking lunch from home to work               Dietician visit, most recent: 11/2015                  Weight history:    Wt Readings from Last 3 Encounters:  12/10/18 184 lb (83.5 kg)  12/02/18 184 lb (83.5 kg)  10/27/18 184 lb (83.5 kg)     Glycemic control:   Lab Results  Component Value Date   HGBA1C 6.8 (H) 02/17/2019   HGBA1C 6.4 11/18/2018   HGBA1C 7.4 (H) 09/17/2018   Lab Results  Component Value Date   MICROALBUR <0.7 08/16/2018   [place] 74 11/18/2018   CREATININE 0.96 02/17/2019      Lab on 02/17/2019  Component Date Value Ref Range Status  . Sodium 02/17/2019 134* 135 - 145 mEq/L Final  . Potassium 02/17/2019 4.5  3.5 - 5.1 mEq/L Final  .  Chloride 02/17/2019 102  96 - 112 mEq/L Final  . CO2 02/17/2019 24  19 - 32 mEq/L Final  . Glucose, Bld 02/17/2019 99  70 - 99 mg/dL Final  . BUN 02/17/2019 17  6 - 23 mg/dL Final  . Creatinine, Ser 02/17/2019 0.96  0.40 - 1.50 mg/dL Final  . Calcium 02/17/2019 9.4  8.4 - 10.5 mg/dL Final  . GFR 02/17/2019 79.69  >60.00 mL/min Final  . Hgb A1c MFr Bld 02/17/2019 6.8* 4.6 - 6.5 % Final   Glycemic Control Guidelines for People with Diabetes:Non Diabetic:  <6%Goal of Therapy: <7%Additional Action Suggested:  >8%         Allergies as of 02/20/2019      Reactions   Contrast Media [iodinated Diagnostic Agents] Swelling   Can take if premedicated with diphenhydramine   Iodine Swelling   Metformin And Related Diarrhea      Medication List       Accurate as of February 20, 2019  2:17 PM. If you have any questions, ask your nurse or doctor.        Accu-Chek FastClix Lancets Misc Use lancets to check blood sugar 6 times daily.   aspirin EC 81 MG tablet Take 1 tablet (81 mg total) by mouth daily.   atorvastatin 20 MG tablet Commonly known as: LIPITOR Take 1 tablet (20 mg total) by mouth daily.   canagliflozin 300 MG Tabs tablet Commonly known as: Invokana TAKE 1 TABLET BY MOUTH ONCE DAILY.   fluticasone 50 MCG/ACT nasal spray Commonly known as: FLONASE Place 2 sprays into both nostrils daily. What changed:   when to take this  reasons to take this   glucose blood test strip Commonly known as: Accu-Chek Guide Use as instructed to check blood sugar 6 times daily.   HumaLOG KwikPen 100 UNIT/ML KwikPen Generic drug: insulin lispro INJECT 6-10 UNITS INTO THE SKIN 3 TIMES DAILY BEFORE MEALS   insulin degludec 100 UNIT/ML Sopn FlexTouch Pen Commonly known as: Tyler Aas FlexTouch Inject 0.26 mLs (26 Units total) into the skin daily. Inject 26 units under the skin daily.   Insulin Pen Needle 32G X 4 MM Misc Commonly known as: BD Pen Needle Nano U/F 1 each by Does not apply route  daily. USE PEN NEEDLES TO INJECT INSULIN  5 TIMES DAILY. DX:E11.65   loperamide 2 MG tablet Commonly known as: IMODIUM A-D Take 2 mg by mouth 4 (four) times daily as needed for diarrhea or loose stools.   MULTIPLE VITAMIN PO Take 1 packet by mouth daily.   sodium chloride 0.65 % Soln nasal spray Commonly known as: OCEAN Place 1 spray into both nostrils as needed for congestion.   traMADol 50 MG tablet Commonly known as: Ultram Take 1 tablet (50 mg total) by mouth every 6 (six) hours as needed.   Victoza 18 MG/3ML Sopn Generic drug: liraglutide INJECT 0.3 MLS (1.8 MG TOTAL) INTO THE SKIN DAILY.   zolpidem 10 MG tablet Commonly known as: AMBIEN Take 1 tablet (10 mg total) by mouth at bedtime as needed for up to 30 days for sleep.       Past Medical History:  Diagnosis Date  . Colon polyps    adenomatous  . Diverticulosis 10/26/2013  . DM (diabetes mellitus), type 2 with neurological complications ([place]) 11/04/5571  . 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 History of chicken pox    childhood  . History of kidney stones   . Hyperlipidemia 07/26/2014  . Hypertension   . Insomnia 02/25/2013  . Preventative health care 07/26/2014  . Renal lithiasis 02/21/2015  . Sciatica of right side 12/22/2012  . Seasonal allergies   . 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 Trigger finger 05/03/2017      Past Surgical History:  Procedure Laterality Date  . ABDOMINAL HERNIA REPAIR    . CHOLECYSTECTOMY    . HAND SURGERY    . HERNIA REPAIR    . INGUINAL HERNIA REPAIR Right 09/23/2018   Procedure: LAPAROSCOPIC RIGHT  INGUINAL HERNIA REPAIR WITH MESH;  Surgeon: Ralene Ok, MD;  Location: [facility];  Service: General;  Laterality: Right;  . LAPAROSCOPIC APPENDECTOMY N/A 08/16/2014   Procedure: APPENDECTOMY LAPAROSCOPIC;  Surgeon: Erroll Luna, MD;  Location: [facility];  Service: General;  Laterality: N/A;  . MASS EXCISION  05/22/2012    Procedure: EXCISION MASS;  Surgeon: Joyice Faster. Cornett, MD;  Location: [facility];  Service: General;  Laterality: Left;  excision abdominal wall mass  . ROTATOR CUFF REPAIR    . SHOULDER ARTHROSCOPY    . TRIGGER FINGER RELEASE Left 01/15/2018   Procedure: LEFT TRIGGER FINGER A-1 PULLEY RELEASE;  Surgeon: [name], MD;  Location: [facility];  Service: Orthopedics;  Laterality: Left;      Family History  Problem Relation Age of Onset  . Lung cancer Mother   . Diabetes Maternal Aunt   . Cancer Paternal Grandmother   . Breast cancer Neg Hx   . Colon cancer Neg Hx   . Prostate cancer Neg Hx   . Heart disease Neg Hx          Review of Systems    Lipid history: Had been on long-term treatment with statins  He says he is now taking 20 mg Lipitor with the following results  Lab Results  Component Value Date   CHOL 150 11/18/2018   CHOL 256 (H) 07/19/2018   CHOL 115 04/27/2017   Lab Results  Component Value Date   HDL 37.70 (L) 11/18/2018   HDL 36.20 (L) 07/19/2018   HDL 45.70 04/27/2017   Lab Results  Component Value Date   LDLCALC 74 11/18/2018   LDLCALC 52 04/27/2017   LDLCALC 42 10/03/2016   Lab  Results  Component Value Date   TRIG 191.0 (H) 11/18/2018   TRIG (H) 07/19/2018    662.0 Triglyceride is over 400; calculations on Lipids are invalid.   TRIG 91.0 04/27/2017   Lab Results  Component Value Date   CHOLHDL 4 11/18/2018   CHOLHDL 7 07/19/2018   CHOLHDL 3 04/27/2017   Lab Results  Component Value Date   LDLDIRECT 146.0 07/19/2018   LDLDIRECT 109.0 10/26/2014   LDLDIRECT 116.4 07/17/2014             Hypertension: Blood pressure is usually well controlled     BP Readings from Last 3 Encounters:  12/10/18 122/71  12/02/18 (!) 141/85  10/27/18 131/87      Physical Examination:   Exam not done, this is a telehealth visit   ASSESSMENT:   Diabetes type 2,  with normal  BMI   See history of present illness  for detailed discussion of current diabetes management, blood sugar patterns and problems identified   His A1c  is consistent with basal bolus insulin regimen and Victoza  He is having slightly higher A1c compared to his last visit at 6.8 However this may be because of occasionally having low sugars previously  Blood sugar patterns were reviewed He is forgetting to check his sugars fasting and discussed that this is important to adjust his Tyler Aas Recently he is complaining about nodules but not too much bruising with his insulin pen Not clear if this is related to some reaction at the site of injections He is very active and his weight is stable  Also blood sugars may be consistently controlled with switching his Invokana to the morning    PLAN:   Discussed trying to rotate injection sites to the outer thigh area and arm if possible For convenience he will take Ozempic instead of Victoza; he prefers this instead of daily Rybelsus in the morning  However not clear if he will need to be on 1 mg eventually of the Ozempic Discussed titration of the dosage of Ozempic  No change in insulin at this time Reminded him that he will need to take mealtime insulin consistently regardless of taking the other injections He will try to be consistent with diet and limit amounts of high fat and high carbohydrate meals  He will message [place] when he is out of Victoza  Counseling time on subjects discussed in assessment and plan sections is over 50% of today's 25 minute visit   Elayne Snare 02/20/19

## 2019-02-27 ENCOUNTER — Other Ambulatory Visit: Payer: Self-pay

## 2019-02-27 MED ORDER — OZEMPIC (0.25 OR 0.5 MG/DOSE) 2 MG/1.5ML ~~LOC~~ SOPN: 0.2500 mg | PEN_INJECTOR | SUBCUTANEOUS | 3 refills | Status: AC

## 2019-02-27 MED FILL — TRESIBA FLEXTOUCH 100 UNITS: 100 | 35 days supply | Qty: 9 | Fill #1

## 2019-02-27 MED FILL — OZEMPIC 0.25 OR 0.5 MG/DOSE: 2 | 28 days supply | Qty: 2 | Fill #0

## 2019-03-06 ENCOUNTER — Other Ambulatory Visit: Payer: Self-pay | Admitting: Endocrinology

## 2019-03-06 MED FILL — HUMALOG 100 UNITS/ML KWIKPE: 100 | 20 days supply | Qty: 6 | Fill #0

## 2019-03-11 ENCOUNTER — Ambulatory Visit: Payer: No Typology Code available for payment source | Admitting: Family Medicine

## 2019-03-13 ENCOUNTER — Other Ambulatory Visit: Payer: Self-pay

## 2019-03-13 ENCOUNTER — Encounter: Payer: Self-pay | Admitting: Family Medicine

## 2019-03-13 ENCOUNTER — Ambulatory Visit (INDEPENDENT_AMBULATORY_CARE_PROVIDER_SITE_OTHER): Payer: No Typology Code available for payment source | Admitting: Family Medicine

## 2019-03-13 DIAGNOSIS — I10 Essential (primary) hypertension: Secondary | ICD-10-CM

## 2019-03-13 DIAGNOSIS — E1149 Type 2 diabetes mellitus with other diabetic neurological complication: Secondary | ICD-10-CM

## 2019-03-13 DIAGNOSIS — G4700 Insomnia, unspecified: Secondary | ICD-10-CM | POA: Diagnosis not present

## 2019-03-13 DIAGNOSIS — E782 Mixed hyperlipidemia: Secondary | ICD-10-CM

## 2019-03-13 LAB — COMPREHENSIVE METABOLIC PANEL
ALT: 30 U/L (ref 0–53)
AST: 21 U/L (ref 0–37)
Albumin: 4.6 g/dL (ref 3.5–5.2)
Alkaline Phosphatase: 87 U/L (ref 39–117)
BUN: 22 mg/dL (ref 6–23)
CO2: 25 mEq/L (ref 19–32)
Calcium: 9.8 mg/dL (ref 8.4–10.5)
Chloride: 103 mEq/L (ref 96–112)
Creatinine, Ser: 0.93 mg/dL (ref 0.40–1.50)
GFR: 82.64 mL/min (ref >60.00)
Glucose, Bld: 123 mg/dL — ABNORMAL HIGH (ref 70–99)
Potassium: 4.9 mEq/L (ref 3.5–5.1)
Sodium: 136 mEq/L (ref 135–145)
Total Bilirubin: 0.5 mg/dL (ref 0.2–1.2)
Total Protein: 7.5 g/dL (ref 6.0–8.3)

## 2019-03-13 LAB — CBC
HCT: 49.2 % (ref 39.0–52.0)
Hemoglobin: 16.5 g/dL (ref 13.0–17.0)
MCHC: 33.6 g/dL (ref 30.0–36.0)
MCV: 91.4 fl (ref 78.0–100.0)
Platelets: 195 10*3/uL (ref 150.0–400.0)
RBC: 5.38 Mil/uL (ref 4.22–5.81)
RDW: 14 % (ref 11.5–15.5)
WBC: 6.7 10*3/uL (ref 4.0–10.5)

## 2019-03-13 LAB — LIPID PANEL
Cholesterol: 150 mg/dL (ref 0–200)
HDL: 45.7 mg/dL (ref >39.00)
LDL Cholesterol: 83 mg/dL (ref 0–99)
NonHDL: 104.49
Total CHOL/HDL Ratio: 3
Triglycerides: 106 mg/dL (ref 0.0–149.0)
VLDL: 21.2 mg/dL (ref 0.0–40.0)

## 2019-03-13 LAB — TSH: TSH: 1.16 u[IU]/mL (ref 0.35–4.50)

## 2019-03-13 NOTE — Assessment & Plan Note (Signed)
Encouraged heart healthy diet, increase exercise, avoid trans fats, consider a krill oil cap daily 

## 2019-03-13 NOTE — Patient Instructions (Signed)
Pulse oximeter for home Hypertension, Adult High blood pressure (hypertension) is when the force of blood pumping through the arteries is too strong. The arteries are the blood vessels that carry blood from the heart throughout the body. Hypertension forces the heart to work harder to pump blood and may cause arteries to become narrow or stiff. Untreated or uncontrolled hypertension can cause a heart attack, heart failure, a stroke, kidney disease, and other problems. A blood pressure reading consists of a higher number over a lower number. Ideally, your blood pressure should be below 120/80. The first ("top") number is called the systolic pressure. It is a measure of the pressure in your arteries as your heart beats. The second ("bottom") number is called the diastolic pressure. It is a measure of the pressure in your arteries as the heart relaxes. What are the causes? The exact cause of this condition is not known. There are some conditions that result in or are related to high blood pressure. What increases the risk? Some risk factors for high blood pressure are under your control. The following factors may make you more likely to develop this condition:  Smoking.  Having type 2 diabetes mellitus, high cholesterol, or both.  Not getting enough exercise or physical activity.  Being overweight.  Having too much fat, sugar, calories, or salt (sodium) in your diet.  Drinking too much alcohol. Some risk factors for high blood pressure may be difficult or impossible to change. Some of these factors include:  Having chronic kidney disease.  Having a family history of high blood pressure.  Age. Risk increases with age.  Race. You may be at higher risk if you are African American.  Gender. Men are at higher risk than women before age 45. After age 76, women are at higher risk than men.  Having obstructive sleep apnea.  Stress. What are the signs or symptoms? High blood pressure may not  cause symptoms. Very high blood pressure (hypertensive crisis) may cause:  Headache.  Anxiety.  Shortness of breath.  Nosebleed.  Nausea and vomiting.  Vision changes.  Severe chest pain.  Seizures. How is this diagnosed? This condition is diagnosed by measuring your blood pressure while you are seated, with your arm resting on a flat surface, your legs uncrossed, and your feet flat on the floor. The cuff of the blood pressure monitor will be placed directly against the skin of your upper arm at the level of your heart. It should be measured at least twice using the same arm. Certain conditions can cause a difference in blood pressure between your right and left arms. Certain factors can cause blood pressure readings to be lower or higher than normal for a short period of time:  When your blood pressure is higher when you are in a health care provider's office than when you are at home, this is called white coat hypertension. Most people with this condition do not need medicines.  When your blood pressure is higher at home than when you are in a health care provider's office, this is called masked hypertension. Most people with this condition may need medicines to control blood pressure. If you have a high blood pressure reading during one visit or you have normal blood pressure with other risk factors, you may be asked to:  Return on a different day to have your blood pressure checked again.  Monitor your blood pressure at home for 1 week or longer. If you are diagnosed with hypertension, you may  have other blood or imaging tests to help your health care provider understand your overall risk for other conditions. How is this treated? This condition is treated by making healthy lifestyle changes, such as eating healthy foods, exercising more, and reducing your alcohol intake. Your health care provider may prescribe medicine if lifestyle changes are not enough to get your blood pressure  under control, and if:  Your systolic blood pressure is above 130.  Your diastolic blood pressure is above 80. Your personal target blood pressure may vary depending on your medical conditions, your age, and other factors. Follow these instructions at home: Eating and drinking   Eat a diet that is high in fiber and potassium, and low in sodium, added sugar, and fat. An example eating plan is called the DASH (Dietary Approaches to Stop Hypertension) diet. To eat this way: ? Eat plenty of fresh fruits and vegetables. Try to fill one half of your plate at each meal with fruits and vegetables. ? Eat whole grains, such as whole-wheat pasta, brown rice, or whole-grain bread. Fill about one fourth of your plate with whole grains. ? Eat or drink low-fat dairy products, such as skim milk or low-fat yogurt. ? Avoid fatty cuts of meat, processed or cured meats, and poultry with skin. Fill about one fourth of your plate with lean proteins, such as fish, chicken without skin, beans, eggs, or tofu. ? Avoid pre-made and processed foods. These tend to be higher in sodium, added sugar, and fat.  Reduce your daily sodium intake. Most people with hypertension should eat less than 1,500 mg of sodium a day.  Do not drink alcohol if: ? Your health care provider tells you not to drink. ? You are pregnant, may be pregnant, or are planning to become pregnant.  If you drink alcohol: ? Limit how much you use to:  0-1 drink a day for women.  0-2 drinks a day for men. ? Be aware of how much alcohol is in your drink. In the [place] one drink equals one 12 oz bottle of beer (355 mL), one 5 oz glass of wine (148 mL), or one 1 oz glass of hard liquor (44 mL). Lifestyle   Work with your health care provider to maintain a healthy body weight or to lose weight. Ask what an ideal weight is for you.  Get at least 30 minutes of exercise most days of the week. Activities may include walking, swimming, or biking.   Include exercise to strengthen your muscles (resistance exercise), such as Pilates or lifting weights, as part of your weekly exercise routine. Try to do these types of exercises for 30 minutes at least 3 days a week.  Do not use any products that contain nicotine or tobacco, such as cigarettes, e-cigarettes, and chewing tobacco. If you need help quitting, ask your health care provider.  Monitor your blood pressure at home as told by your health care provider.  Keep all follow-up visits as told by your health care provider. This is important. Medicines  Take over-the-counter and prescription medicines only as told by your health care provider. Follow directions carefully. Blood pressure medicines must be taken as prescribed.  Do not skip doses of blood pressure medicine. Doing this puts you at risk for problems and can make the medicine less effective.  Ask your health care provider about side effects or reactions to medicines that you should watch for. Contact a health care provider if you:  Think you are having a reaction  to a medicine you are taking.  Have headaches that keep coming back (recurring).  Feel dizzy.  Have swelling in your ankles.  Have trouble with your vision. Get help right away if you:  Develop a severe headache or confusion.  Have unusual weakness or numbness.  Feel faint.  Have severe pain in your chest or abdomen.  Vomit repeatedly.  Have trouble breathing. Summary  Hypertension is when the force of blood pumping through your arteries is too strong. If this condition is not controlled, it may put you at risk for serious complications.  Your personal target blood pressure may vary depending on your medical conditions, your age, and other factors. For most people, a normal blood pressure is less than 120/80.  Hypertension is treated with lifestyle changes, medicines, or a combination of both. Lifestyle changes include losing weight, eating a healthy,  low-sodium diet, exercising more, and limiting alcohol. This information is not intended to replace advice given to you by your health care provider. Make sure you discuss any questions you have with your health care provider. Document Released: 07/31/2005 Document Revised: 04/10/2018 Document Reviewed: 04/10/2018 Elsevier Patient Education  2020 [employer].

## 2019-03-13 NOTE — Assessment & Plan Note (Signed)
Well controlled, no changes to meds. Encouraged heart healthy diet such as the DASH diet and exercise as tolerated.  °

## 2019-03-13 NOTE — Assessment & Plan Note (Signed)
hgba1c acceptable, minimize simple carbs. Increase exercise as tolerated. Continue current meds 

## 2019-03-16 NOTE — Assessment & Plan Note (Signed)
Encouraged good sleep hygiene such as dark, quiet room. No blue/green glowing lights such as computer screens in bedroom. No alcohol or stimulants in evening. Cut down on caffeine as able. Regular exercise is helpful but not just prior to bed time. Using Zolpidem intermittently with good results.

## 2019-03-16 NOTE — Progress Notes (Signed)
Subjective:    [name], male    DOB: 1956-08-01, 63 y.o.   MRN: [id number]  No chief complaint on file.   HPI Patient is in today for follow up on chronic medial concerns including diabetes, hypertension, insomnia and more. He feels well. No recent febrile illness or hospitalizations. No c/o polyuria or polydipsia. 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 Denies CP/palp/SOB/HA/congestion/fevers/GI or GU c/o. Taking meds as prescribed  Past Medical History:  Diagnosis Date  . Colon polyps    adenomatous  . Diverticulosis 10/26/2013  . DM (diabetes mellitus), type 2 with neurological complications ([place]) 9/38/1017  . 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 History of chicken pox    childhood  . History of kidney stones   . Hyperlipidemia 07/26/2014  . Hypertension   . Insomnia 02/25/2013  . Preventative health care 07/26/2014  . Renal lithiasis 02/21/2015  . Sciatica of right side 12/22/2012  . Seasonal allergies   . 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 Trigger finger 05/03/2017    Past Surgical History:  Procedure Laterality Date  . ABDOMINAL HERNIA REPAIR    . CHOLECYSTECTOMY    . HAND SURGERY    . HERNIA REPAIR    . INGUINAL HERNIA REPAIR Right 09/23/2018   Procedure: LAPAROSCOPIC RIGHT  INGUINAL HERNIA REPAIR WITH MESH;  Surgeon: Ralene Ok, MD;  Location: [facility];  Service: General;  Laterality: Right;  . LAPAROSCOPIC APPENDECTOMY N/A 08/16/2014   Procedure: APPENDECTOMY LAPAROSCOPIC;  Surgeon: Erroll Luna, MD;  Location: [facility];  Service: General;  Laterality: N/A;  . MASS EXCISION  05/22/2012   Procedure: EXCISION MASS;  Surgeon: Joyice Faster. Cornett, MD;  Location: [facility];  Service: General;  Laterality: Left;  excision abdominal wall mass  . ROTATOR CUFF REPAIR    . SHOULDER ARTHROSCOPY    . TRIGGER FINGER RELEASE Left 01/15/2018   Procedure:  LEFT TRIGGER FINGER A-1 PULLEY RELEASE;  Surgeon: [name], MD;  Location: [facility];  Service: Orthopedics;  Laterality: Left;    Family History  Problem Relation Age of Onset  . Lung cancer Mother   . Diabetes Maternal Aunt   . Cancer Paternal Grandmother   . Breast cancer Neg Hx   . Colon cancer Neg Hx   . Prostate cancer Neg Hx   . Heart disease Neg Hx     Social History   Socioeconomic History  . Marital status: Married    Spouse name: Not on file  . Number of children: 3  . Years of education: Not on file  . Highest education level: Not on file  Occupational History    Comment: Animal nutritionist [employer]  Social Needs  . Financial resource strain: Not on file  . Food insecurity    Worry: Not on file    Inability: Not on file  . Transportation needs    Medical: Not on file    Non-medical: Not on file  Tobacco Use  . Smoking status: Never Smoker  . Smokeless tobacco: Never Used  Substance and Sexual Activity  . Alcohol use: No  . Drug use: No  . Sexual activity: Yes  Lifestyle  . Physical activity    Days per week: Not on file    Minutes per session: Not on file  . Stress: Not on file  Relationships  . Social connections  Talks on phone: Not on file    Gets together: Not on file    Attends religious service: Not on file    Active member of club or organization: Not on file    Attends meetings of clubs or organizations: Not on file    Relationship status: Not on file  . Intimate partner violence    Fear of current or ex partner: Not on file    Emotionally abused: Not on file    Physically abused: Not on file    Forced sexual activity: Not on file  Other Topics Concern  . Not on file  Social History Narrative  . Not on file    Outpatient Medications Prior to Visit  Medication Sig Dispense Refill  . ACCU-CHEK FASTCLIX LANCETS MISC Use lancets to check blood sugar 6 times daily. 200 each 12  . aspirin EC 81 MG tablet Take 1  tablet (81 mg total) by mouth daily.    Marland Kitchen atorvastatin (LIPITOR) 20 MG tablet Take 1 tablet (20 mg total) by mouth daily. 90 tablet 3  . canagliflozin (INVOKANA) 300 MG TABS tablet TAKE 1 TABLET BY MOUTH ONCE DAILY. 90 tablet 3  . fluticasone (FLONASE) 50 MCG/ACT nasal spray Place 2 sprays into both nostrils daily. (Patient taking differently: Place 2 sprays into both nostrils daily as needed. ) 16 g 1  . glucose blood (ACCU-CHEK GUIDE) test strip Use as instructed to check blood sugar 6 times daily. 200 each 12  . HUMALOG KWIKPEN 100 UNIT/ML KwikPen INJECT 6-10 UNITS INTO THE SKIN 3 TIMES DAILY BEFORE MEALS 6 mL 0  . insulin degludec (TRESIBA FLEXTOUCH) 100 UNIT/ML SOPN FlexTouch Pen Inject 0.26 mLs (26 Units total) into the skin daily. Inject 26 units under the skin daily. 10 mL 3  . Insulin Pen Needle (BD PEN NEEDLE NANO U/F) 32G X 4 MM MISC 1 each by Does not apply route daily. USE PEN NEEDLES TO INJECT INSULIN 5 TIMES DAILY. DX:E11.65 200 each 11  . loperamide (IMODIUM A-D) 2 MG tablet Take 2 mg by mouth 4 (four) times daily as needed for diarrhea or loose stools.    . MULTIPLE VITAMIN PO Take 1 packet by mouth daily.    . Semaglutide,0.25 or 0.5MG /DOS, (OZEMPIC, 0.25 OR 0.5 MG/DOSE,) 2 MG/1.5ML SOPN Inject 0.25 mg into the skin once a week. Inject 0.25mg  under the skin once weekly for two weeks and then increase to 0.5mg  weekly. 1 pen 3  . sodium chloride (OCEAN) 0.65 % SOLN nasal spray Place 1 spray into both nostrils as needed for congestion.    . traMADol (ULTRAM) 50 MG tablet Take 1 tablet (50 mg total) by mouth every 6 (six) hours as needed. 20 tablet 0  . zolpidem (AMBIEN) 10 MG tablet Take 1 tablet (10 mg total) by mouth at bedtime as needed for up to 30 days for sleep. 15 tablet 5   No facility-administered medications prior to visit.     Allergies  Allergen Reactions  . Contrast Media [Iodinated Diagnostic Agents] Swelling    Can take if premedicated with diphenhydramine  .  Iodine Swelling  . Metformin And Related Diarrhea    Review of Systems  Constitutional: Negative for fever and malaise/fatigue.  HENT: Negative for congestion.   Eyes: Negative for blurred vision.  Respiratory: Negative for shortness of breath.   Cardiovascular: Negative for chest pain, palpitations and leg swelling.  Gastrointestinal: Negative for abdominal pain, blood in stool and nausea.  Genitourinary: Negative for dysuria  and frequency.  Musculoskeletal: Negative for falls.  Skin: Negative for rash.  Neurological: Negative for dizziness, loss of consciousness and headaches.  Endo/Heme/Allergies: Negative for environmental allergies.  Psychiatric/Behavioral: Negative for depression. The patient is not nervous/anxious.        Objective:    Physical Exam Vitals signs and nursing note reviewed.  Constitutional:      General: He is not in acute distress.    Appearance: He is well-developed.  HENT:     Head: Normocephalic and atraumatic.     Nose: Nose normal.  Eyes:     General:        Right eye: No discharge.        Left eye: No discharge.  Neck:     Musculoskeletal: Normal range of motion and neck supple.  Cardiovascular:     Rate and Rhythm: Normal rate and regular rhythm.     Heart sounds: No murmur.  Pulmonary:     Effort: Pulmonary effort is normal.     Breath sounds: Normal breath sounds.  Abdominal:     General: Bowel sounds are normal.     Palpations: Abdomen is soft.     Tenderness: There is no abdominal tenderness.  Skin:    General: Skin is warm and dry.  Neurological:     Mental Status: He is alert and oriented to person, place, and time.     BP 122/82 (BP Location: Left Arm, Patient Position: Sitting, Cuff Size: Normal)   Pulse 77   Temp 98 F (36.7 C) (Oral)   Resp 18   Wt 200 lb 3.2 oz (90.8 kg)   SpO2 98%   BMI 27.15 kg/m  Wt Readings from Last 3 Encounters:  03/13/19 200 lb 3.2 oz (90.8 kg)  12/10/18 184 lb (83.5 kg)  12/02/18 184 lb  (83.5 kg)    Diabetic Foot Exam - Simple   No data filed     Lab Results  Component Value Date   WBC 6.7 03/13/2019   HGB 16.5 03/13/2019   HCT 49.2 03/13/2019   PLT 195.0 03/13/2019   GLUCOSE 123 (H) 03/13/2019   CHOL 150 03/13/2019   TRIG 106.0 03/13/2019   HDL 45.70 03/13/2019   LDLDIRECT 146.0 07/19/2018   LDLCALC 83 03/13/2019   ALT 30 03/13/2019   AST 21 03/13/2019   NA 136 03/13/2019   K 4.9 03/13/2019   CL 103 03/13/2019   CREATININE 0.93 03/13/2019   BUN 22 03/13/2019   CO2 25 03/13/2019   TSH 1.16 03/13/2019   PSA 1.07 04/24/2018   INR 1.10 08/16/2014   HGBA1C 6.8 (H) 02/17/2019   MICROALBUR <0.7 08/16/2018    Lab Results  Component Value Date   TSH 1.16 03/13/2019   Lab Results  Component Value Date   WBC 6.7 03/13/2019   HGB 16.5 03/13/2019   HCT 49.2 03/13/2019   MCV 91.4 03/13/2019   PLT 195.0 03/13/2019   Lab Results  Component Value Date   NA 136 03/13/2019   K 4.9 03/13/2019   CO2 25 03/13/2019   GLUCOSE 123 (H) 03/13/2019   BUN 22 03/13/2019   CREATININE 0.93 03/13/2019   BILITOT 0.5 03/13/2019   ALKPHOS 87 03/13/2019   AST 21 03/13/2019   ALT 30 03/13/2019   PROT 7.5 03/13/2019   ALBUMIN 4.6 03/13/2019   CALCIUM 9.8 03/13/2019   ANIONGAP 7 10/27/2018   GFR 82.64 03/13/2019   Lab Results  Component Value Date   CHOL 150 03/13/2019  Lab Results  Component Value Date   HDL 45.70 03/13/2019   Lab Results  Component Value Date   LDLCALC 83 03/13/2019   Lab Results  Component Value Date   TRIG 106.0 03/13/2019   Lab Results  Component Value Date   CHOLHDL 3 03/13/2019   Lab Results  Component Value Date   HGBA1C 6.8 (H) 02/17/2019       Assessment & Plan:   Problem List Items Addressed This Visit    DM (diabetes mellitus), type 2 with neurological complications ([place])    [id number] acceptable, minimize simple carbs. Increase exercise as tolerated. Continue current meds      HTN (hypertension)    Well  controlled, no changes to meds. Encouraged heart healthy diet such as the DASH diet and exercise as tolerated.       Relevant Orders   CBC (Completed)   Comprehensive metabolic panel (Completed)   TSH (Completed)   Insomnia    Encouraged good sleep hygiene such as dark, quiet room. No blue/green glowing lights such as computer screens in bedroom. No alcohol or stimulants in evening. Cut down on caffeine as able. Regular exercise is helpful but not just prior to bed time. Using Zolpidem intermittently with good results.       Hyperlipidemia, mixed    Encouraged heart healthy diet, increase exercise, avoid trans fats, consider a krill oil cap daily      Relevant Orders   Lipid panel (Completed)      I am having Reginia Forts. [place] "Rev. Reginia Forts. Gabrielsen" maintain his loperamide, aspirin EC, sodium chloride, MULTIPLE VITAMIN PO, fluticasone, canagliflozin, atorvastatin, Insulin Pen Needle, glucose blood, Accu-Chek FastClix Lancets, traMADol, insulin degludec, zolpidem, Ozempic (0.25 or 0.5 MG/DOSE), and HumaLOG KwikPen.  No orders of the defined types were placed in this encounter.    [name], MD

## 2019-03-26 ENCOUNTER — Other Ambulatory Visit: Payer: Self-pay | Admitting: Endocrinology

## 2019-03-26 MED FILL — BD PEN NDL NANO 32GX5/32": 32G X 4 MM | 40 days supply | Qty: 200 | Fill #1

## 2019-03-26 MED FILL — TRESIBA FLEXTOUCH 100 UNITS: 100 | 35 days supply | Qty: 9 | Fill #0

## 2019-03-26 MED FILL — BD PEN NDL NANO 32GX5/32: 32G X 4 MM | 40 days supply | Qty: 200 | Fill #1

## 2019-04-04 ENCOUNTER — Other Ambulatory Visit: Payer: Self-pay | Admitting: Endocrinology

## 2019-04-04 MED FILL — HUMALOG 100 UNITS/ML KWIKPE: 100 | 20 days supply | Qty: 6 | Fill #0

## 2019-04-07 MED FILL — OZEMPIC 0.25 OR 0.5 MG/DOSE: 2 | 28 days supply | Qty: 2 | Fill #1

## 2019-05-11 IMAGING — CR DG SACRUM/COCCYX 2+V
4 series · 4 of 4 positions shown · non-contrast
Comparison: None.

CLINICAL DATA: [REDACTED], slid down children's slide and landed
directly on coccyx, been having pain in sacrum/coccyx up to lower
back ever since, pain while using the bathroom.

EXAM:
SACRUM AND COCCYX - 2+ VIEW

[t sacrum a.p.]
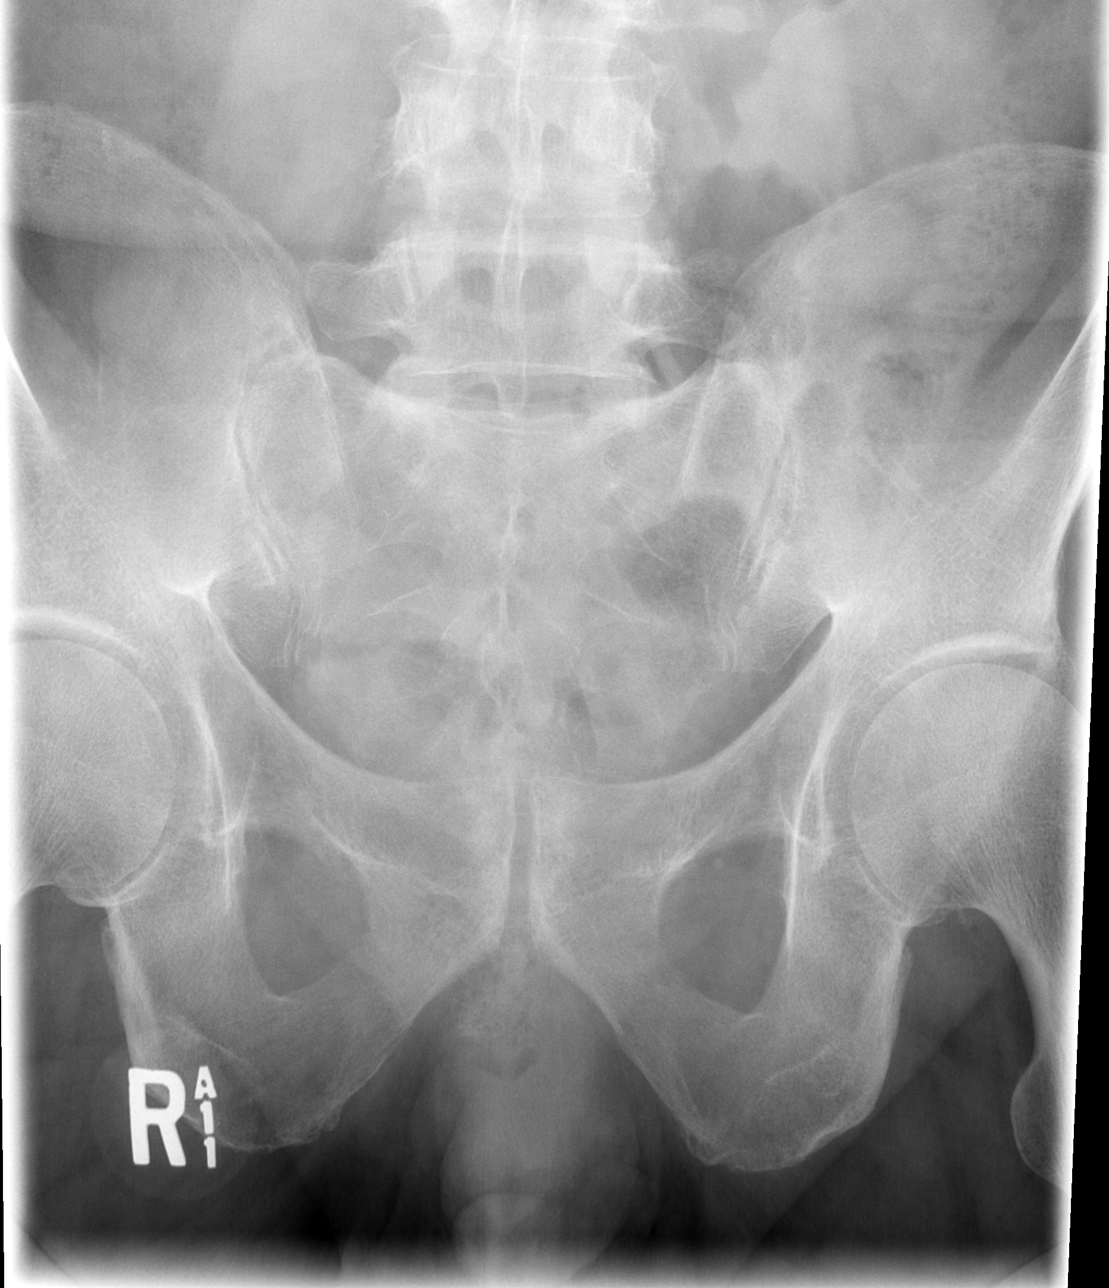

[t coccyx a.p.]
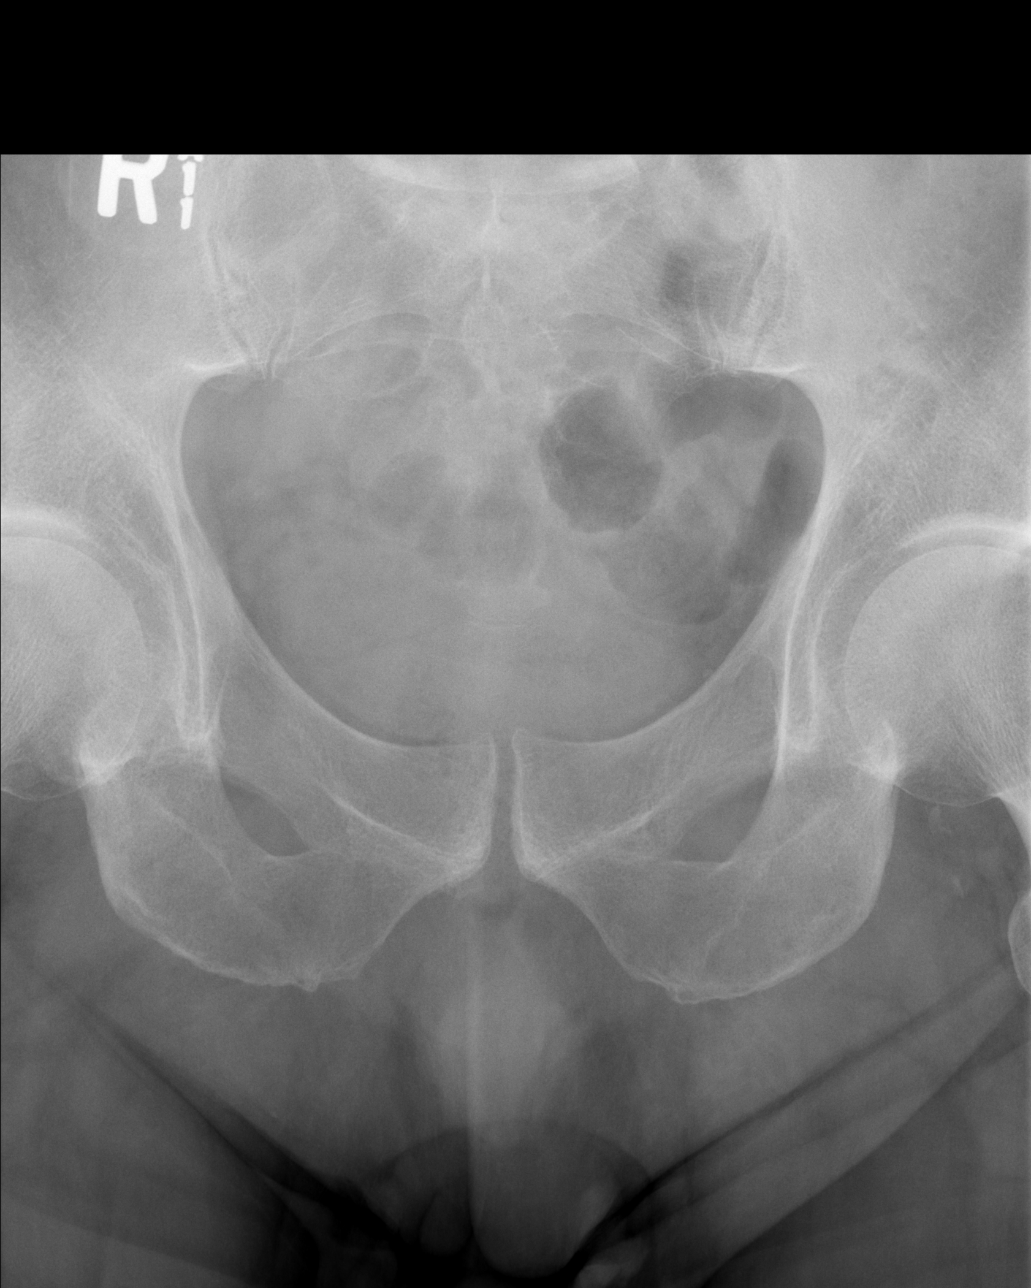

[t sacrum lat (1 of 2)]
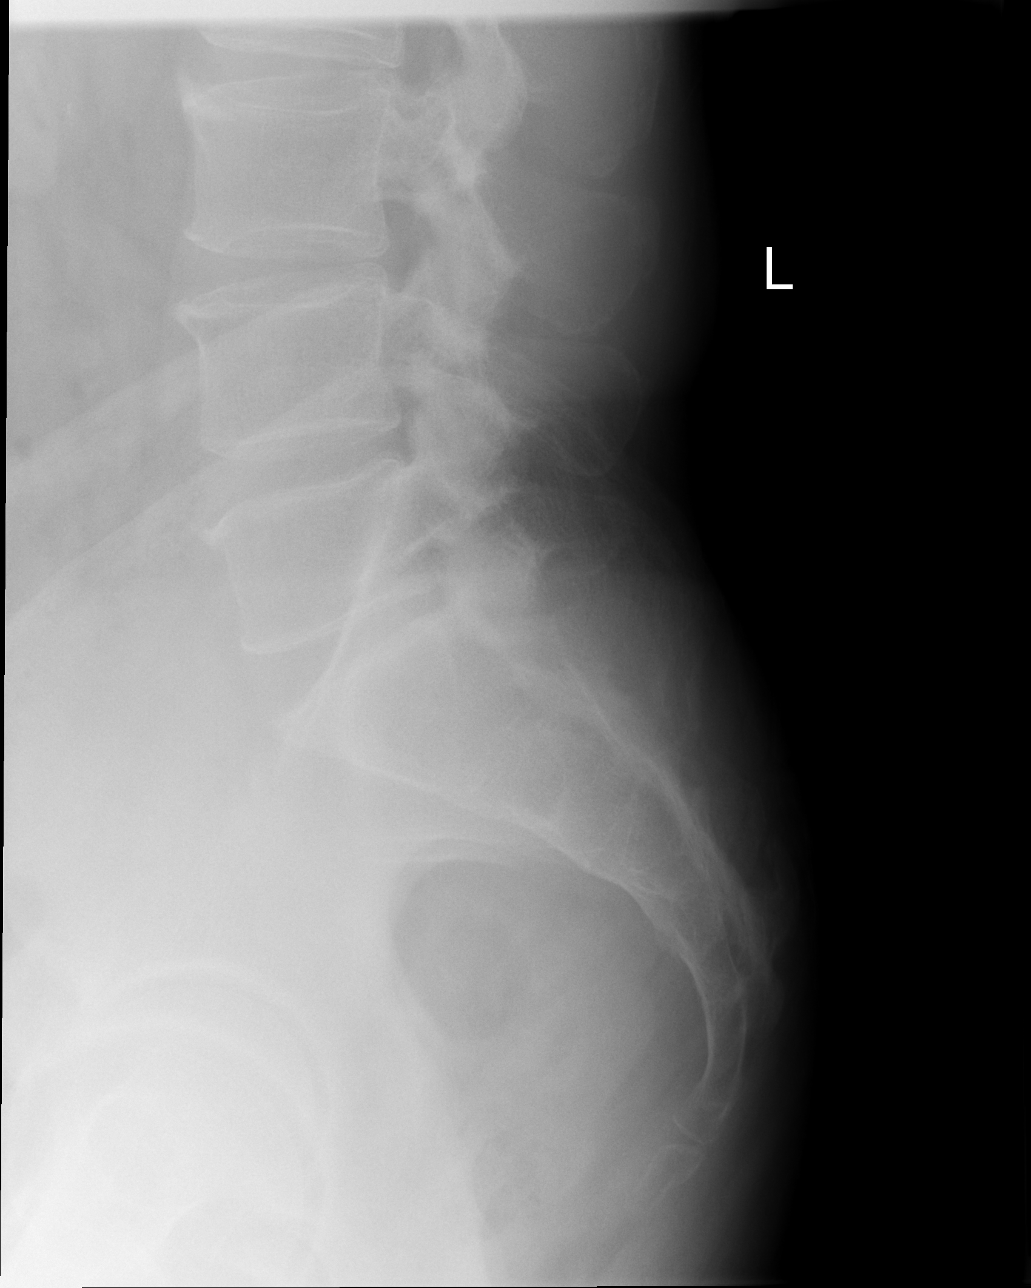

[t sacrum lat (2 of 2)]
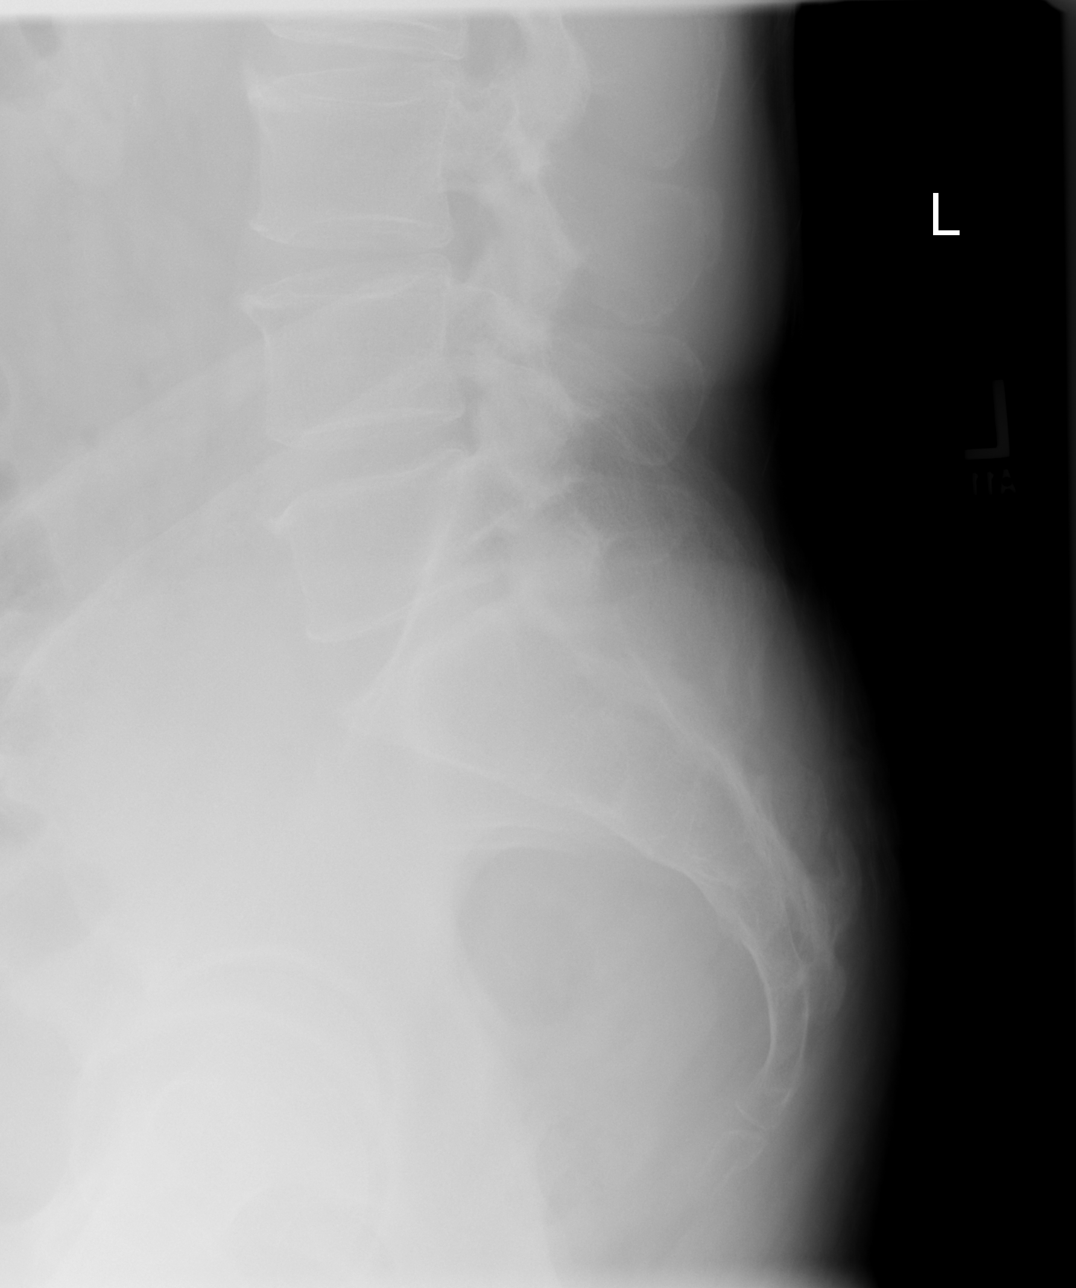

[4 of 4 positions shown; findings below may reference images not displayed]

FINDINGS: There is no evidence of fracture or other focal bone lesions.
IMPRESSION: Negative.

## 2019-05-19 ENCOUNTER — Other Ambulatory Visit: Payer: No Typology Code available for payment source

## 2019-05-22 ENCOUNTER — Ambulatory Visit: Payer: No Typology Code available for payment source | Admitting: Endocrinology

## 2019-06-05 ENCOUNTER — Encounter: Payer: Self-pay | Admitting: Family Medicine

## 2019-06-09 IMAGING — US US PELVIS LIMITED
1 series · 14 of 14 positions shown · non-contrast
Comparison: Prior CT from 07/03/2018.

CLINICAL DATA: Initial evaluation for right superior groin pain for
3 weeks. Evaluate for hernia.

EXAM:
LIMITED ULTRASOUND OF PELVIS
TECHNIQUE: Limited transabdominal ultrasound examination of the pelvis was
performed.

[Series 1: us pelvis limited · 0.07mm/px · 14 acquisitions, 14 frames shown]
[im 1/14]
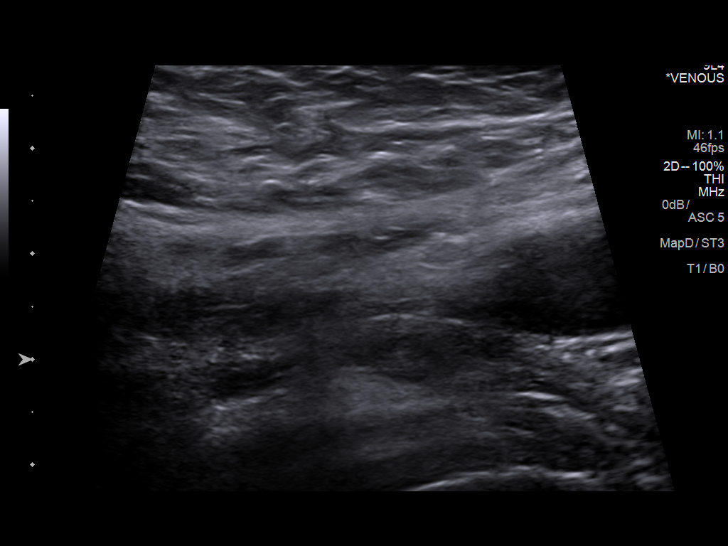
[im 2/14]
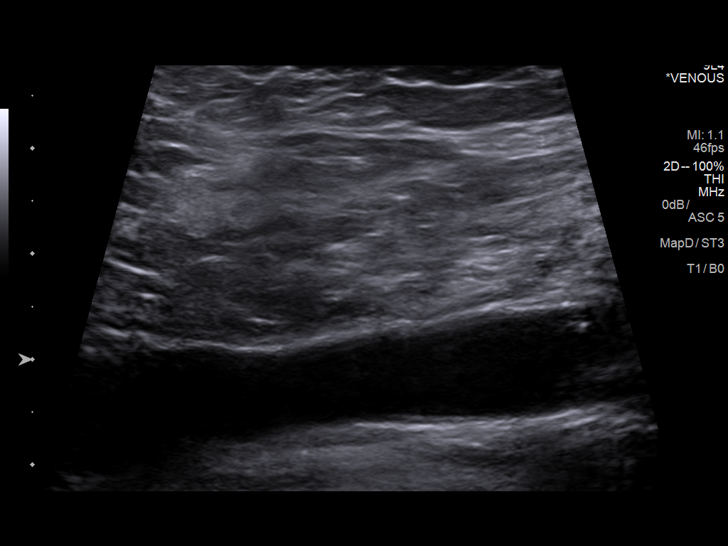
[im 3/14]
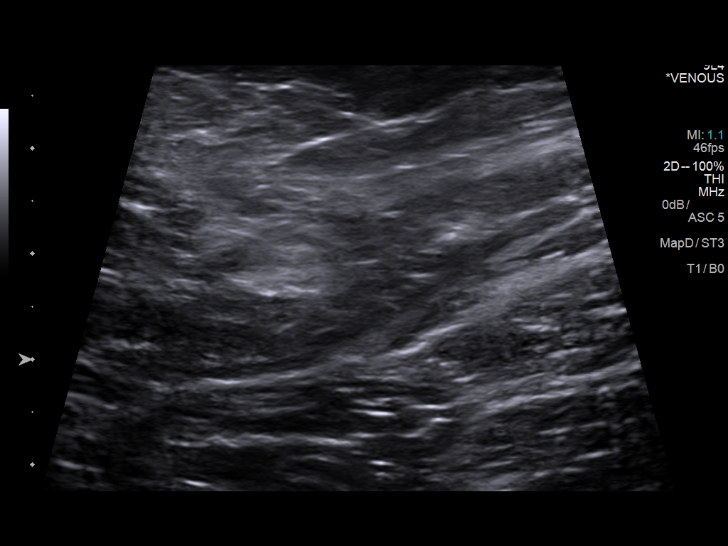
[im 4/14]
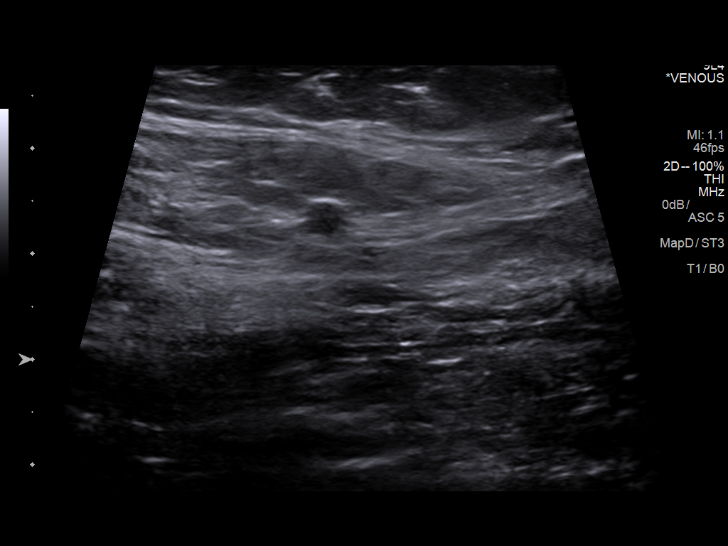
[im 5/14]
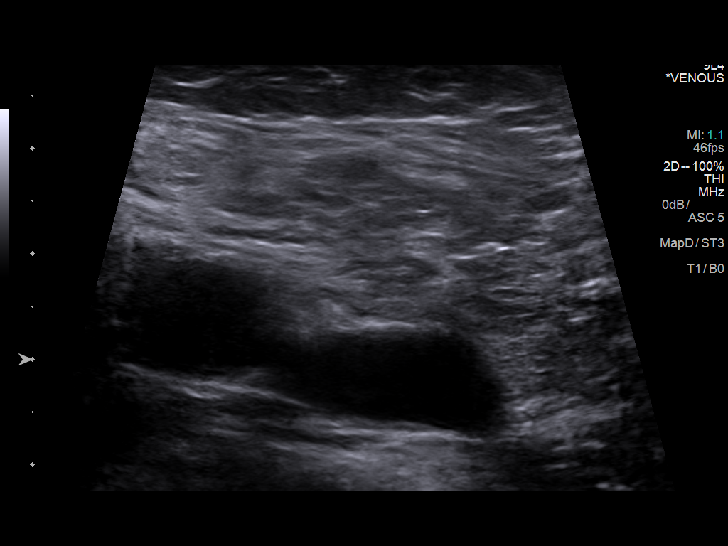
[im 6/14]
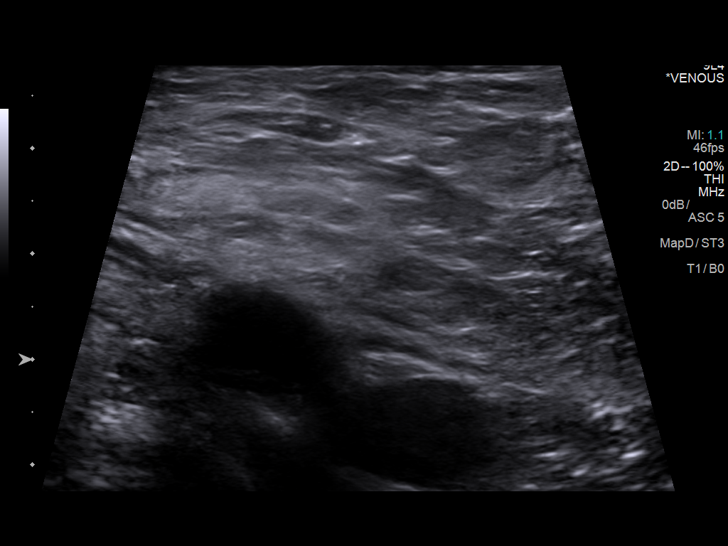
[im 7/14]
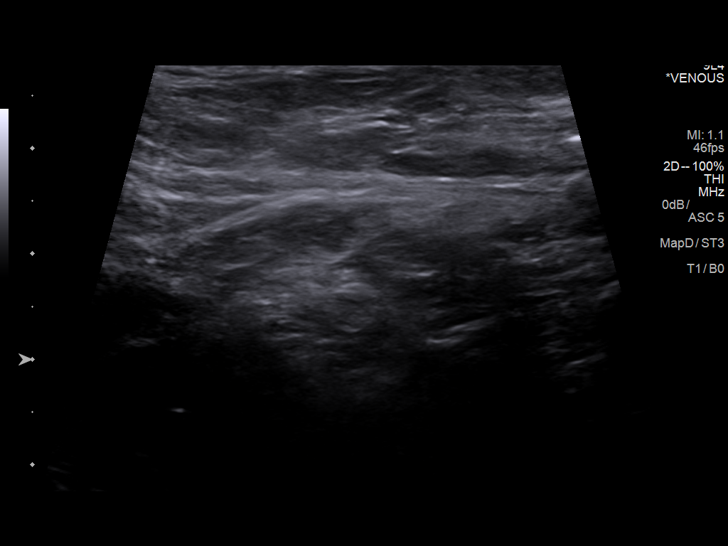
[im 8/14]
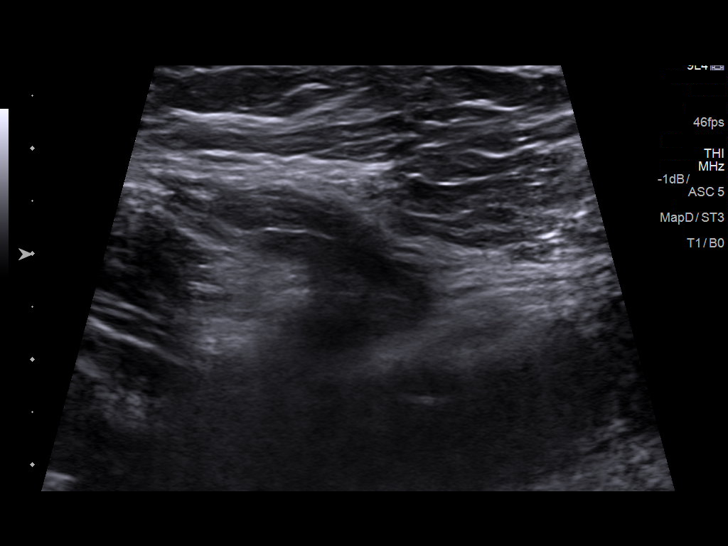
[im 9/14]
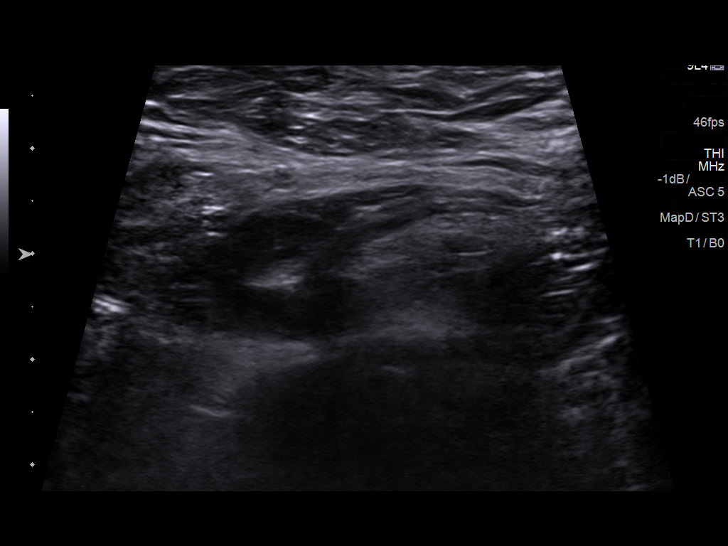
[im 10/14]
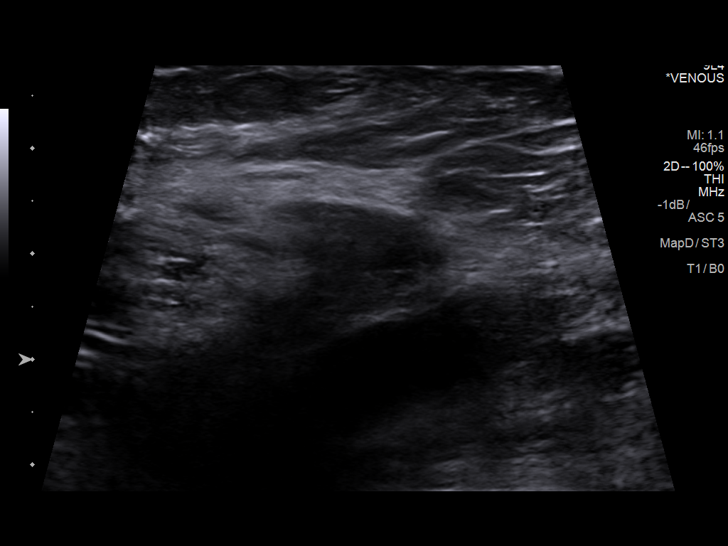
[im 11/14]
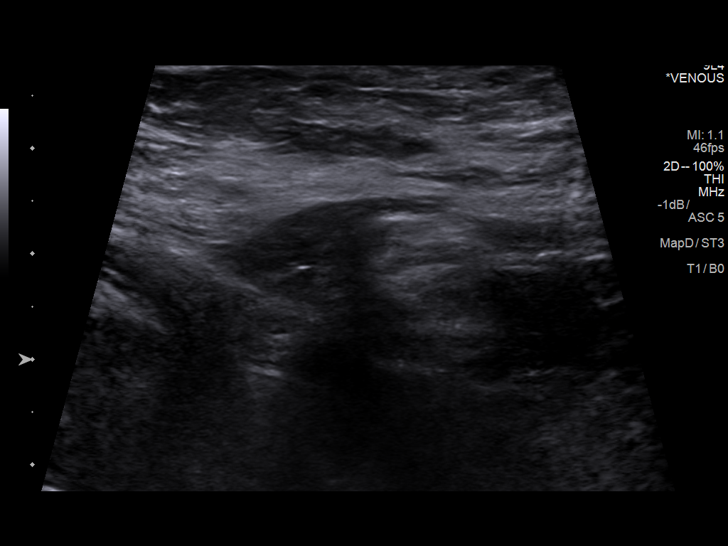
[im 12/14]
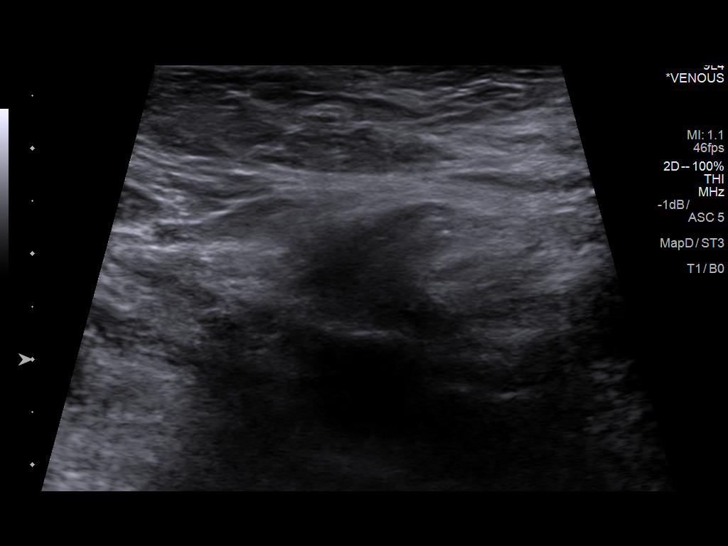
[im 13/14]
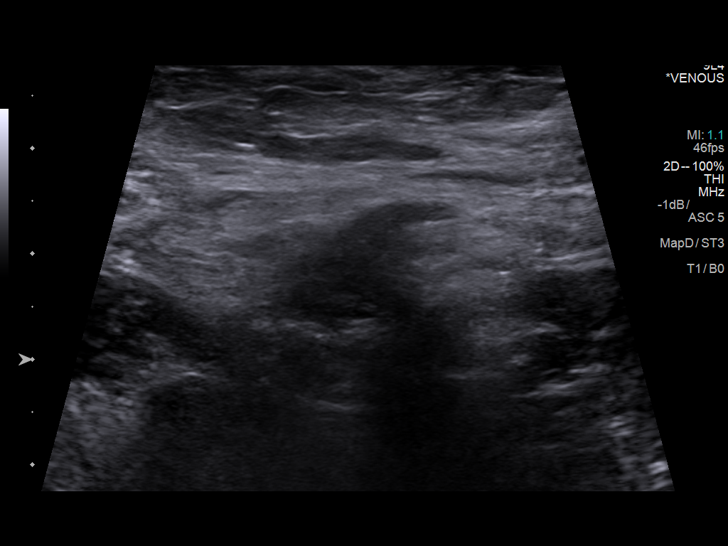
[im 14/14]
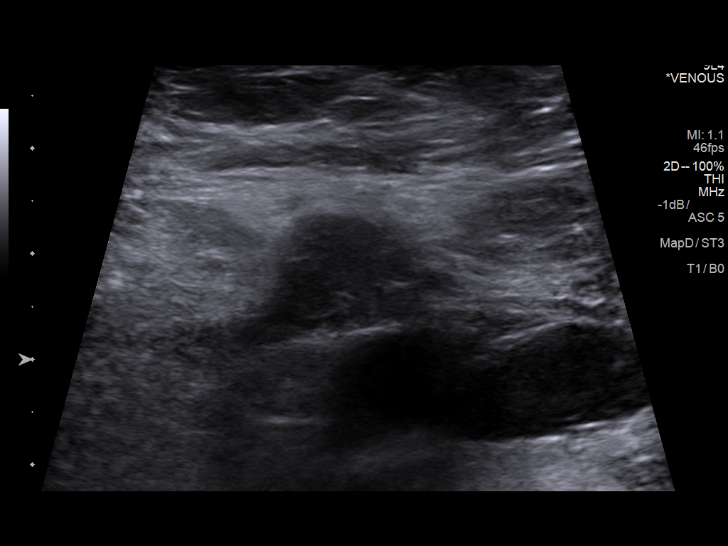

[14 of 14 positions shown; findings below may reference images not displayed]

FINDINGS: Targeted ultrasound of the right inguinal region at site of pain was
performed. Ultrasound demonstrates a probable small fat containing
right inguinal hernia, visible with Valsalva. No herniated loops of
bowel or other structures identified. Surrounding soft tissues are
normal in appearance without abnormality.
IMPRESSION: Small fat containing right inguinal hernia with Valsalva. No
herniated loops of bowel or other complication.

## 2019-07-14 ENCOUNTER — Ambulatory Visit: Payer: No Typology Code available for payment source | Admitting: Family Medicine
